# Patient Record
Sex: Female | Born: 1940 | Race: Black or African American | Hispanic: No | Smoking: Never smoker
Health system: Southern US, Community
[De-identification: ages and names within clinical notes are randomized; demographics above are authoritative.]

## PROBLEM LIST (undated history)

## (undated) DIAGNOSIS — M1990 Unspecified osteoarthritis, unspecified site: Secondary | ICD-10-CM

## (undated) DIAGNOSIS — Z872 Personal history of diseases of the skin and subcutaneous tissue: Secondary | ICD-10-CM

## (undated) DIAGNOSIS — D649 Anemia, unspecified: Secondary | ICD-10-CM

## (undated) DIAGNOSIS — N189 Chronic kidney disease, unspecified: Secondary | ICD-10-CM

## (undated) DIAGNOSIS — E669 Obesity, unspecified: Secondary | ICD-10-CM

## (undated) DIAGNOSIS — I10 Essential (primary) hypertension: Secondary | ICD-10-CM

## (undated) DIAGNOSIS — E119 Type 2 diabetes mellitus without complications: Secondary | ICD-10-CM

## (undated) DIAGNOSIS — G609 Hereditary and idiopathic neuropathy, unspecified: Secondary | ICD-10-CM

## (undated) DIAGNOSIS — G5600 Carpal tunnel syndrome, unspecified upper limb: Secondary | ICD-10-CM

## (undated) DIAGNOSIS — H409 Unspecified glaucoma: Secondary | ICD-10-CM

## (undated) DIAGNOSIS — E785 Hyperlipidemia, unspecified: Secondary | ICD-10-CM

## (undated) HISTORY — DX: Hyperlipidemia, unspecified: E78.5

## (undated) HISTORY — DX: Unspecified glaucoma: H40.9

## (undated) HISTORY — DX: Personal history of diseases of the skin and subcutaneous tissue: Z87.2

## (undated) HISTORY — DX: Type 2 diabetes mellitus without complications: E11.9

## (undated) HISTORY — DX: Carpal tunnel syndrome, unspecified upper limb: G56.00

## (undated) HISTORY — DX: Anemia, unspecified: D64.9

## (undated) HISTORY — PX: COLONOSCOPY: SHX174

## (undated) HISTORY — DX: Chronic kidney disease, unspecified: N18.9

## (undated) HISTORY — DX: Essential (primary) hypertension: I10

## (undated) HISTORY — DX: Hereditary and idiopathic neuropathy, unspecified: G60.9

## (undated) HISTORY — DX: Obesity, unspecified: E66.9

---

## 1997-12-30 ENCOUNTER — Other Ambulatory Visit: Admission: RE | Admit: 1997-12-30 | Discharge: 1997-12-30 | Payer: Self-pay | Admitting: Obstetrics & Gynecology

## 1997-12-30 ENCOUNTER — Other Ambulatory Visit: Admission: RE | Admit: 1997-12-30 | Discharge: 1997-12-30 | Payer: Self-pay | Admitting: Obstetrics and Gynecology

## 1998-01-06 ENCOUNTER — Encounter: Admission: RE | Admit: 1998-01-06 | Discharge: 1998-04-06 | Payer: Self-pay | Admitting: Internal Medicine

## 2000-02-02 ENCOUNTER — Emergency Department (HOSPITAL_COMMUNITY): Admission: EM | Admit: 2000-02-02 | Discharge: 2000-02-03 | Payer: Self-pay | Admitting: *Deleted

## 2001-10-31 ENCOUNTER — Inpatient Hospital Stay (HOSPITAL_COMMUNITY): Admission: EM | Admit: 2001-10-31 | Discharge: 2001-11-07 | Payer: Self-pay | Admitting: *Deleted

## 2001-10-31 ENCOUNTER — Encounter: Payer: Self-pay | Admitting: Internal Medicine

## 2001-11-01 ENCOUNTER — Encounter: Admission: RE | Admit: 2001-11-01 | Discharge: 2001-11-01 | Payer: Self-pay | Admitting: Internal Medicine

## 2001-11-01 ENCOUNTER — Encounter: Payer: Self-pay | Admitting: Internal Medicine

## 2001-11-15 ENCOUNTER — Encounter: Admission: RE | Admit: 2001-11-15 | Discharge: 2001-11-15 | Payer: Self-pay | Admitting: Internal Medicine

## 2001-12-07 ENCOUNTER — Encounter: Admission: RE | Admit: 2001-12-07 | Discharge: 2001-12-07 | Payer: Self-pay | Admitting: Internal Medicine

## 2001-12-14 ENCOUNTER — Encounter: Admission: RE | Admit: 2001-12-14 | Discharge: 2001-12-14 | Payer: Self-pay | Admitting: Internal Medicine

## 2001-12-20 ENCOUNTER — Encounter: Payer: Self-pay | Admitting: Emergency Medicine

## 2001-12-20 ENCOUNTER — Emergency Department (HOSPITAL_COMMUNITY): Admission: EM | Admit: 2001-12-20 | Discharge: 2001-12-20 | Payer: Self-pay | Admitting: Emergency Medicine

## 2001-12-22 ENCOUNTER — Other Ambulatory Visit: Admission: RE | Admit: 2001-12-22 | Discharge: 2001-12-22 | Payer: Self-pay | Admitting: General Surgery

## 2002-02-23 ENCOUNTER — Encounter: Admission: RE | Admit: 2002-02-23 | Discharge: 2002-02-23 | Payer: Self-pay | Admitting: Internal Medicine

## 2002-03-09 ENCOUNTER — Encounter: Admission: RE | Admit: 2002-03-09 | Discharge: 2002-03-09 | Payer: Self-pay | Admitting: Internal Medicine

## 2002-04-17 ENCOUNTER — Encounter: Admission: RE | Admit: 2002-04-17 | Discharge: 2002-04-17 | Payer: Self-pay | Admitting: Internal Medicine

## 2002-05-01 ENCOUNTER — Encounter: Admission: RE | Admit: 2002-05-01 | Discharge: 2002-05-01 | Payer: Self-pay | Admitting: Internal Medicine

## 2002-05-15 ENCOUNTER — Encounter: Admission: RE | Admit: 2002-05-15 | Discharge: 2002-05-15 | Payer: Self-pay | Admitting: Internal Medicine

## 2002-07-30 ENCOUNTER — Encounter: Admission: RE | Admit: 2002-07-30 | Discharge: 2002-07-30 | Payer: Self-pay | Admitting: Internal Medicine

## 2002-08-03 ENCOUNTER — Encounter: Admission: RE | Admit: 2002-08-03 | Discharge: 2002-08-03 | Payer: Self-pay | Admitting: Internal Medicine

## 2002-08-13 ENCOUNTER — Encounter: Admission: RE | Admit: 2002-08-13 | Discharge: 2002-08-13 | Payer: Self-pay | Admitting: Internal Medicine

## 2002-08-30 ENCOUNTER — Encounter: Admission: RE | Admit: 2002-08-30 | Discharge: 2002-08-30 | Payer: Self-pay | Admitting: Internal Medicine

## 2002-12-17 ENCOUNTER — Encounter: Admission: RE | Admit: 2002-12-17 | Discharge: 2002-12-17 | Payer: Self-pay | Admitting: Internal Medicine

## 2003-01-14 ENCOUNTER — Encounter: Admission: RE | Admit: 2003-01-14 | Discharge: 2003-01-14 | Payer: Self-pay | Admitting: Internal Medicine

## 2003-01-25 ENCOUNTER — Encounter: Admission: RE | Admit: 2003-01-25 | Discharge: 2003-01-25 | Payer: Self-pay | Admitting: Internal Medicine

## 2003-11-29 ENCOUNTER — Ambulatory Visit: Payer: Self-pay | Admitting: Internal Medicine

## 2004-02-13 ENCOUNTER — Ambulatory Visit: Payer: Self-pay | Admitting: Internal Medicine

## 2004-09-14 ENCOUNTER — Ambulatory Visit: Payer: Self-pay | Admitting: Internal Medicine

## 2004-09-18 ENCOUNTER — Ambulatory Visit: Payer: Self-pay | Admitting: Internal Medicine

## 2004-09-23 ENCOUNTER — Ambulatory Visit: Payer: Self-pay | Admitting: Oncology

## 2004-10-08 ENCOUNTER — Encounter: Admission: RE | Admit: 2004-10-08 | Discharge: 2004-10-08 | Payer: Self-pay | Admitting: Internal Medicine

## 2004-11-18 ENCOUNTER — Ambulatory Visit: Payer: Self-pay | Admitting: Oncology

## 2005-01-04 ENCOUNTER — Ambulatory Visit: Payer: Self-pay | Admitting: Hospitalist

## 2005-01-11 ENCOUNTER — Ambulatory Visit: Payer: Self-pay | Admitting: Internal Medicine

## 2005-01-12 ENCOUNTER — Ambulatory Visit: Payer: Self-pay | Admitting: Oncology

## 2005-03-05 ENCOUNTER — Ambulatory Visit: Payer: Self-pay | Admitting: Oncology

## 2005-04-20 ENCOUNTER — Ambulatory Visit: Payer: Self-pay | Admitting: Oncology

## 2005-04-27 ENCOUNTER — Ambulatory Visit: Payer: Self-pay | Admitting: Internal Medicine

## 2005-05-04 ENCOUNTER — Ambulatory Visit: Payer: Self-pay | Admitting: Internal Medicine

## 2005-11-16 ENCOUNTER — Ambulatory Visit: Payer: Self-pay | Admitting: Internal Medicine

## 2005-12-02 ENCOUNTER — Ambulatory Visit: Payer: Self-pay | Admitting: Gastroenterology

## 2005-12-15 ENCOUNTER — Encounter: Payer: Self-pay | Admitting: Internal Medicine

## 2005-12-15 ENCOUNTER — Ambulatory Visit: Payer: Self-pay | Admitting: Gastroenterology

## 2006-02-22 ENCOUNTER — Ambulatory Visit: Payer: Self-pay | Admitting: Internal Medicine

## 2006-02-22 ENCOUNTER — Encounter (INDEPENDENT_AMBULATORY_CARE_PROVIDER_SITE_OTHER): Payer: Self-pay | Admitting: *Deleted

## 2006-02-22 DIAGNOSIS — D539 Nutritional anemia, unspecified: Secondary | ICD-10-CM

## 2006-02-22 DIAGNOSIS — E669 Obesity, unspecified: Secondary | ICD-10-CM

## 2006-02-22 DIAGNOSIS — E785 Hyperlipidemia, unspecified: Secondary | ICD-10-CM

## 2006-02-22 DIAGNOSIS — E1365 Other specified diabetes mellitus with hyperglycemia: Secondary | ICD-10-CM

## 2006-02-22 DIAGNOSIS — K5900 Constipation, unspecified: Secondary | ICD-10-CM | POA: Insufficient documentation

## 2006-02-22 DIAGNOSIS — I10 Essential (primary) hypertension: Secondary | ICD-10-CM

## 2006-02-22 DIAGNOSIS — G5600 Carpal tunnel syndrome, unspecified upper limb: Secondary | ICD-10-CM | POA: Insufficient documentation

## 2006-02-22 DIAGNOSIS — E1329 Other specified diabetes mellitus with other diabetic kidney complication: Secondary | ICD-10-CM

## 2006-02-22 DIAGNOSIS — E1140 Type 2 diabetes mellitus with diabetic neuropathy, unspecified: Secondary | ICD-10-CM

## 2006-02-22 LAB — CONVERTED CEMR LAB
BUN: 27 mg/dL — ABNORMAL HIGH (ref 6–23)
Calcium: 9.3 mg/dL (ref 8.4–10.5)
Glucose, Bld: 117 mg/dL — ABNORMAL HIGH (ref 70–99)
Potassium: 4.3 meq/L (ref 3.5–5.3)
Sodium: 141 meq/L (ref 135–145)

## 2006-04-06 ENCOUNTER — Encounter: Admission: RE | Admit: 2006-04-06 | Discharge: 2006-04-06 | Payer: Self-pay | Admitting: Sports Medicine

## 2006-05-16 ENCOUNTER — Telehealth (INDEPENDENT_AMBULATORY_CARE_PROVIDER_SITE_OTHER): Payer: Self-pay | Admitting: *Deleted

## 2006-06-28 ENCOUNTER — Ambulatory Visit: Payer: Self-pay | Admitting: Hospitalist

## 2006-06-28 DIAGNOSIS — Z872 Personal history of diseases of the skin and subcutaneous tissue: Secondary | ICD-10-CM | POA: Insufficient documentation

## 2006-06-28 DIAGNOSIS — J069 Acute upper respiratory infection, unspecified: Secondary | ICD-10-CM | POA: Insufficient documentation

## 2006-06-28 DIAGNOSIS — G609 Hereditary and idiopathic neuropathy, unspecified: Secondary | ICD-10-CM | POA: Insufficient documentation

## 2006-06-28 LAB — CONVERTED CEMR LAB: Hgb A1c MFr Bld: 6.4 %

## 2006-07-25 ENCOUNTER — Encounter (INDEPENDENT_AMBULATORY_CARE_PROVIDER_SITE_OTHER): Payer: Self-pay | Admitting: *Deleted

## 2006-08-04 ENCOUNTER — Ambulatory Visit: Payer: Self-pay | Admitting: Internal Medicine

## 2006-08-04 ENCOUNTER — Encounter (INDEPENDENT_AMBULATORY_CARE_PROVIDER_SITE_OTHER): Payer: Self-pay | Admitting: *Deleted

## 2006-08-08 LAB — CONVERTED CEMR LAB
Albumin: 4.3 g/dL (ref 3.5–5.2)
Alkaline Phosphatase: 51 units/L (ref 39–117)
Bilirubin Urine: NEGATIVE
CO2: 24 meq/L (ref 19–32)
Chloride: 108 meq/L (ref 96–112)
Creatinine, Ser: 1.37 mg/dL — ABNORMAL HIGH (ref 0.40–1.20)
Creatinine, Urine: 173.2 mg/dL
Eosinophils Absolute: 0.1 10*3/uL (ref 0.0–0.7)
Eosinophils Relative: 2 % (ref 0–5)
Glucose, Bld: 109 mg/dL — ABNORMAL HIGH (ref 70–99)
Hemoglobin: 9.5 g/dL — ABNORMAL LOW (ref 12.0–15.0)
Lymphocytes Relative: 36 % (ref 12–46)
Lymphs Abs: 2.2 10*3/uL (ref 0.7–3.3)
MCV: 87.4 fL (ref 78.0–100.0)
Monocytes Absolute: 0.5 10*3/uL (ref 0.2–0.7)
Monocytes Relative: 8 % (ref 3–11)
Neutrophils Relative %: 54 % (ref 43–77)
Platelets: 244 10*3/uL (ref 150–400)
Potassium: 5 meq/L (ref 3.5–5.3)
RBC: 3.42 M/uL — ABNORMAL LOW (ref 3.87–5.11)
Sodium: 140 meq/L (ref 135–145)
Specific Gravity, Urine: 1.02 (ref 1.005–1.03)
TSH: 1.243 microintl units/mL (ref 0.350–5.50)
Total Bilirubin: 0.4 mg/dL (ref 0.3–1.2)
Total CHOL/HDL Ratio: 3.3
Triglycerides: 86 mg/dL (ref <150)

## 2006-10-20 ENCOUNTER — Telehealth: Payer: Self-pay | Admitting: *Deleted

## 2006-12-02 ENCOUNTER — Telehealth: Payer: Self-pay | Admitting: *Deleted

## 2006-12-16 ENCOUNTER — Encounter (INDEPENDENT_AMBULATORY_CARE_PROVIDER_SITE_OTHER): Payer: Self-pay | Admitting: *Deleted

## 2007-01-02 ENCOUNTER — Telehealth (INDEPENDENT_AMBULATORY_CARE_PROVIDER_SITE_OTHER): Payer: Self-pay | Admitting: *Deleted

## 2007-01-17 ENCOUNTER — Ambulatory Visit: Payer: Self-pay | Admitting: Internal Medicine

## 2007-01-17 ENCOUNTER — Encounter (INDEPENDENT_AMBULATORY_CARE_PROVIDER_SITE_OTHER): Payer: Self-pay | Admitting: *Deleted

## 2007-01-17 LAB — CONVERTED CEMR LAB
Albumin: 4.2 g/dL (ref 3.5–5.2)
Alkaline Phosphatase: 50 units/L (ref 39–117)
CO2: 24 meq/L (ref 19–32)
Chloride: 108 meq/L (ref 96–112)
Creatinine, Ser: 1.34 mg/dL — ABNORMAL HIGH (ref 0.40–1.20)
Eosinophils Relative: 2 % (ref 0–5)
Ferritin: 79 ng/mL (ref 10–291)
Glucose, Bld: 77 mg/dL (ref 70–99)
HCT: 30.3 % — ABNORMAL LOW (ref 36.0–46.0)
MCHC: 33 g/dL (ref 30.0–36.0)
MCV: 88.6 fL (ref 78.0–100.0)
Monocytes Absolute: 0.6 10*3/uL (ref 0.1–1.0)
Neutro Abs: 3 10*3/uL (ref 1.7–7.7)
Neutrophils Relative %: 47 % (ref 43–77)
Platelets: 224 10*3/uL (ref 150–400)
RBC: 3.42 M/uL — ABNORMAL LOW (ref 3.87–5.11)
RDW: 13.4 % (ref 11.5–15.5)
Total Bilirubin: 0.4 mg/dL (ref 0.3–1.2)
WBC: 6.3 10*3/uL (ref 4.0–10.5)

## 2007-04-25 ENCOUNTER — Encounter (INDEPENDENT_AMBULATORY_CARE_PROVIDER_SITE_OTHER): Payer: Self-pay | Admitting: *Deleted

## 2007-04-25 ENCOUNTER — Ambulatory Visit: Payer: Self-pay | Admitting: Internal Medicine

## 2007-04-25 LAB — CONVERTED CEMR LAB: Blood Glucose, Fingerstick: 105

## 2007-06-27 ENCOUNTER — Encounter: Payer: Self-pay | Admitting: Internal Medicine

## 2007-07-17 ENCOUNTER — Encounter (INDEPENDENT_AMBULATORY_CARE_PROVIDER_SITE_OTHER): Payer: Self-pay | Admitting: *Deleted

## 2007-07-17 ENCOUNTER — Ambulatory Visit: Payer: Self-pay | Admitting: *Deleted

## 2007-08-17 ENCOUNTER — Encounter (INDEPENDENT_AMBULATORY_CARE_PROVIDER_SITE_OTHER): Payer: Self-pay | Admitting: Internal Medicine

## 2007-08-17 ENCOUNTER — Ambulatory Visit: Payer: Self-pay | Admitting: Internal Medicine

## 2007-08-17 LAB — CONVERTED CEMR LAB: Blood Glucose, Fingerstick: 107

## 2007-08-18 LAB — CONVERTED CEMR LAB
AST: 14 units/L (ref 0–37)
Alkaline Phosphatase: 58 units/L (ref 39–117)
CO2: 23 meq/L (ref 19–32)
Calcium: 9.6 mg/dL (ref 8.4–10.5)
Creatinine, Ser: 1.56 mg/dL — ABNORMAL HIGH (ref 0.40–1.20)
Glucose, Bld: 91 mg/dL (ref 70–99)
HDL: 56 mg/dL (ref >39)
MCV: 87 fL (ref 78.0–100.0)
Platelets: 203 10*3/uL (ref 150–400)
RBC: 3.18 M/uL — ABNORMAL LOW (ref 3.87–5.11)
RDW: 13.5 % (ref 11.5–15.5)
Sodium: 141 meq/L (ref 135–145)
Total Bilirubin: 0.6 mg/dL (ref 0.3–1.2)
Triglycerides: 99 mg/dL (ref <150)
VLDL: 20 mg/dL (ref 0–40)

## 2008-03-20 ENCOUNTER — Encounter: Payer: Self-pay | Admitting: Internal Medicine

## 2008-04-22 ENCOUNTER — Encounter (INDEPENDENT_AMBULATORY_CARE_PROVIDER_SITE_OTHER): Payer: Self-pay | Admitting: Internal Medicine

## 2008-04-22 ENCOUNTER — Ambulatory Visit: Payer: Self-pay | Admitting: *Deleted

## 2008-04-22 ENCOUNTER — Encounter: Payer: Self-pay | Admitting: Internal Medicine

## 2008-04-22 LAB — CONVERTED CEMR LAB
Blood Glucose, Fingerstick: 96
Hgb A1c MFr Bld: 6.6 %

## 2008-04-24 LAB — CONVERTED CEMR LAB
Albumin: 4.1 g/dL (ref 3.5–5.2)
Chloride: 108 meq/L (ref 96–112)
HCT: 31.2 % — ABNORMAL LOW (ref 36.0–46.0)
Hemoglobin: 10.1 g/dL — ABNORMAL LOW (ref 12.0–15.0)
Platelets: 228 10*3/uL (ref 150–400)
RBC: 3.52 M/uL — ABNORMAL LOW (ref 3.87–5.11)
Sodium: 143 meq/L (ref 135–145)
Vitamin B-12: 259 pg/mL (ref 211–911)
WBC: 5.8 10*3/uL (ref 4.0–10.5)

## 2008-04-25 ENCOUNTER — Encounter: Admission: RE | Admit: 2008-04-25 | Discharge: 2008-04-25 | Payer: Self-pay | Admitting: Internal Medicine

## 2008-05-07 ENCOUNTER — Encounter (INDEPENDENT_AMBULATORY_CARE_PROVIDER_SITE_OTHER): Payer: Self-pay | Admitting: Internal Medicine

## 2008-05-07 ENCOUNTER — Ambulatory Visit: Payer: Self-pay | Admitting: Internal Medicine

## 2008-05-14 LAB — CONVERTED CEMR LAB
BUN: 31 mg/dL — ABNORMAL HIGH (ref 6–23)
CO2: 22 meq/L (ref 19–32)
Calcium: 9.8 mg/dL (ref 8.4–10.5)
Creatinine, Ser: 1.61 mg/dL — ABNORMAL HIGH (ref 0.40–1.20)
Glucose, Bld: 104 mg/dL — ABNORMAL HIGH (ref 70–99)
Sodium: 144 meq/L (ref 135–145)

## 2008-05-15 ENCOUNTER — Encounter: Payer: Self-pay | Admitting: Internal Medicine

## 2008-05-16 ENCOUNTER — Ambulatory Visit: Payer: Self-pay | Admitting: *Deleted

## 2008-05-16 ENCOUNTER — Encounter (INDEPENDENT_AMBULATORY_CARE_PROVIDER_SITE_OTHER): Payer: Self-pay | Admitting: Internal Medicine

## 2008-05-16 LAB — CONVERTED CEMR LAB

## 2008-05-17 LAB — CONVERTED CEMR LAB
BUN: 26 mg/dL — ABNORMAL HIGH (ref 6–23)
CO2: 22 meq/L (ref 19–32)
Calcium: 9.5 mg/dL (ref 8.4–10.5)
Creatinine, Ser: 1.36 mg/dL — ABNORMAL HIGH (ref 0.40–1.20)
Potassium: 4.9 meq/L (ref 3.5–5.3)
Sodium: 139 meq/L (ref 135–145)

## 2008-05-20 ENCOUNTER — Encounter: Payer: Self-pay | Admitting: Licensed Clinical Social Worker

## 2008-05-20 ENCOUNTER — Encounter: Payer: Self-pay | Admitting: Internal Medicine

## 2008-07-16 ENCOUNTER — Telehealth: Payer: Self-pay | Admitting: Internal Medicine

## 2008-08-19 ENCOUNTER — Telehealth: Payer: Self-pay | Admitting: Internal Medicine

## 2008-11-26 ENCOUNTER — Encounter: Payer: Self-pay | Admitting: Internal Medicine

## 2008-12-10 ENCOUNTER — Ambulatory Visit: Payer: Self-pay | Admitting: Internal Medicine

## 2008-12-10 DIAGNOSIS — N185 Chronic kidney disease, stage 5: Secondary | ICD-10-CM | POA: Insufficient documentation

## 2008-12-10 DIAGNOSIS — N184 Chronic kidney disease, stage 4 (severe): Secondary | ICD-10-CM

## 2008-12-10 LAB — CONVERTED CEMR LAB: Blood Glucose, Fingerstick: 82

## 2008-12-11 LAB — CONVERTED CEMR LAB
ALT: 8 units/L (ref 0–35)
Albumin: 4 g/dL (ref 3.5–5.2)
Calcium: 10 mg/dL (ref 8.4–10.5)
Chloride: 107 meq/L (ref 96–112)
Creatinine, Ser: 1.38 mg/dL — ABNORMAL HIGH (ref 0.40–1.20)
HCT: 31.3 % — ABNORMAL LOW (ref 36.0–46.0)
Hemoglobin: 10.1 g/dL — ABNORMAL LOW (ref 12.0–15.0)
LDL Cholesterol: 179 mg/dL — ABNORMAL HIGH (ref 0–99)
MCHC: 32.3 g/dL (ref 30.0–36.0)
MCV: 86.5 fL (ref >=78.0)
Potassium: 4.7 meq/L (ref 3.5–5.3)
RDW: 12.7 % (ref 11.5–15.5)
Sodium: 141 meq/L (ref 135–145)
WBC: 6.5 10*3/uL (ref 4.0–10.5)

## 2008-12-18 ENCOUNTER — Ambulatory Visit: Payer: Self-pay | Admitting: Internal Medicine

## 2008-12-18 LAB — CONVERTED CEMR LAB: OCCULT 1: NEGATIVE

## 2009-02-24 ENCOUNTER — Telehealth: Payer: Self-pay | Admitting: Internal Medicine

## 2009-02-25 ENCOUNTER — Emergency Department (HOSPITAL_COMMUNITY): Admission: EM | Admit: 2009-02-25 | Discharge: 2009-02-25 | Payer: Self-pay | Admitting: Family Medicine

## 2009-06-16 ENCOUNTER — Ambulatory Visit: Payer: Self-pay | Admitting: Internal Medicine

## 2009-06-16 ENCOUNTER — Encounter: Payer: Self-pay | Admitting: Internal Medicine

## 2009-06-16 DIAGNOSIS — N390 Urinary tract infection, site not specified: Secondary | ICD-10-CM

## 2009-06-16 LAB — CONVERTED CEMR LAB
Bilirubin Urine: NEGATIVE
Blood Glucose, Fingerstick: 113
Glucose, Urine, Semiquant: NEGATIVE
Ketones, urine, test strip: NEGATIVE
Protein, U semiquant: >=300
Urobilinogen, UA: 0.2

## 2009-06-16 LAB — HM DIABETES FOOT EXAM

## 2009-06-17 ENCOUNTER — Encounter: Payer: Self-pay | Admitting: Internal Medicine

## 2009-06-17 LAB — CONVERTED CEMR LAB
Calcium: 10 mg/dL (ref 8.4–10.5)
Chloride: 107 meq/L (ref 96–112)
Cholesterol: 271 mg/dL — ABNORMAL HIGH (ref 0–200)
Glucose, Bld: 110 mg/dL — ABNORMAL HIGH (ref 70–99)
MCHC: 31.6 g/dL (ref 30.0–36.0)
MCV: 88.5 fL (ref >=78.0)
Platelets: 214 10*3/uL (ref 150–400)
Potassium: 4.1 meq/L (ref 3.5–5.3)
Sodium: 141 meq/L (ref 135–145)
Total CHOL/HDL Ratio: 4.7
Triglycerides: 109 mg/dL (ref <150)
VLDL: 22 mg/dL (ref 0–40)

## 2009-06-24 ENCOUNTER — Encounter: Admission: RE | Admit: 2009-06-24 | Discharge: 2009-06-24 | Payer: Self-pay | Admitting: Internal Medicine

## 2009-07-02 ENCOUNTER — Encounter: Payer: Self-pay | Admitting: Internal Medicine

## 2009-07-02 LAB — HM DIABETES EYE EXAM

## 2009-09-25 ENCOUNTER — Encounter: Payer: Self-pay | Admitting: Internal Medicine

## 2009-10-07 ENCOUNTER — Telehealth: Payer: Self-pay | Admitting: Internal Medicine

## 2010-01-08 ENCOUNTER — Ambulatory Visit: Payer: Self-pay | Admitting: Internal Medicine

## 2010-01-08 DIAGNOSIS — H409 Unspecified glaucoma: Secondary | ICD-10-CM

## 2010-01-09 LAB — CONVERTED CEMR LAB
ALT: 12 units/L (ref 0–35)
Alkaline Phosphatase: 54 units/L (ref 39–117)
Basophils Absolute: 0 10*3/uL (ref 0.0–0.1)
Basophils Relative: 0.7 % (ref 0.0–3.0)
Bilirubin Urine: NEGATIVE
Bilirubin, Direct: 0 mg/dL (ref 0.0–0.3)
Cholesterol: 233 mg/dL — ABNORMAL HIGH (ref 0–200)
Direct LDL: 155.4 mg/dL
Folate: 4.9 ng/mL
HCT: 27.6 % — ABNORMAL LOW (ref 36.0–46.0)
Hgb A1c MFr Bld: 6.9 % — ABNORMAL HIGH (ref 4.6–6.5)
Lymphs Abs: 1.9 10*3/uL (ref 0.7–4.0)
Monocytes Absolute: 0.6 10*3/uL (ref 0.1–1.0)
Monocytes Relative: 11.4 % (ref 3.0–12.0)
Neutrophils Relative %: 49.7 % (ref 43.0–77.0)
Nitrite: NEGATIVE
Platelets: 172 10*3/uL (ref 150.0–400.0)
RBC: 3.21 M/uL — ABNORMAL LOW (ref 3.87–5.11)
Specific Gravity, Urine: 1.02 (ref 1.000–1.030)
Total Bilirubin: 0.5 mg/dL (ref 0.3–1.2)
Total CHOL/HDL Ratio: 5
Total CK: 90 units/L (ref 7–177)
Total Protein: 6.9 g/dL (ref 6.0–8.3)

## 2010-03-25 ENCOUNTER — Encounter: Payer: Self-pay | Admitting: Internal Medicine

## 2010-04-07 NOTE — Assessment & Plan Note (Signed)
Summary: urine results   Impression & Recommendations:  Problem # 1:  UTI (ICD-599.0)  UA positive for leuks and nitrate with moderate blood and pt only reports increased urination otherwise asymptomatic, I will call in the perscription for her. Bactrim DS for 3 days since she has no hx of UTI.  Labs Reviewed: BUN: 24 (12/10/2008)   Cr: 1.38 (12/10/2008)    Hgb: 10.1 (12/10/2008)   Hct: 31.3 (12/10/2008)   Ca++: 10.0 (12/10/2008)    TP: 7.5 (12/10/2008)   Alb: 4.0 (12/10/2008)   Encouraged to push clear liquids, get enough rest, and take acetaminophen as needed. To be seen in 10 days if no improvement, sooner if worse.   Prescriptions: BACTRIM DS 800-160 MG TABS (SULFAMETHOXAZOLE-TRIMETHOPRIM) Take 1 tablet by mouth two times a day  #6 x 0   Entered and Authorized by:   Trinidad Curet MD   Signed by:   Trinidad Curet MD on 06/16/2009   Method used:   Electronically to        [facility] (retail)       [place], [place]  [id number]       Ph: [phone]       Fax: [phone]   RxID:   [id number]   Laboratory Results   Urine Tests  Date/Time Received: 06/16/09 9:26AM Date/Time Reported: same  Routine Urinalysis   Color: yellow Appearance: Hazy Glucose: negative   (Normal Range: Negative) Bilirubin: negative   (Normal Range: Negative) Ketone: negative   (Normal Range: Negative) Spec. Gravity: >=1.030   (Normal Range: 1.003-1.035) Blood: moderate   (Normal Range: Negative) pH: 5.0   (Normal Range: 5.0-8.0) Protein: >=300   (Normal Range: Negative) Urobilinogen: 0.2   (Normal Range: 0-1) Nitrite: positive   (Normal Range: Negative) Leukocyte Esterace: large   (Normal Range: Negative)         Impression & Recommendations:  Her updated medication list for this problem includes:    Bactrim Ds 800-160 Mg Tabs (Sulfamethoxazole-trimethoprim) .Marland Kitchen... Take 1 tablet by mouth two times a day

## 2010-04-07 NOTE — Letter (Signed)
Summary: [facility]  [facility]   Imported By: Garlan Fillers 07/10/2009 15:07:28  _____________________________________________________________________  External Attachment:    Type:   Image     Comment:   External Document  Appended Document: [facility]   Diabetic Eye Exam  Procedure date:  07/02/2009  Findings:      No diabetic retinopathy.   Cataract.  OS     Diabetic Eye Exam  Procedure date:  07/02/2009  Findings:      No diabetic retinopathy.   Cataract.  OS

## 2010-04-07 NOTE — Progress Notes (Signed)
Summary: [facility]  Phone Note Other Incoming   Caller: [facility] Summary of Call: Call from the [facility].  Forms in Dr. Paulita Cradle box to be faxed when completed. Sander Nephew RN  October 07, 2009 12:04 PM  Initial call taken by: Sander Nephew RN,  October 07, 2009 12:04 PM  Follow-up for Phone Call        completed refill, thank you Iskra  Follow-up by: Trinidad Curet MD,  October 07, 2009 2:07 PM

## 2010-04-07 NOTE — Assessment & Plan Note (Signed)
Summary: new / medicare / # / cd   Vital Signs:  Patient profile:   70 year old female Height:      64 inches (162.56 cm) Weight:      215.4 pounds (97.91 kg) O2 Sat:      96 % on Room air Temp:     98.0 degrees F (36.67 degrees C) oral Pulse rate:   80 / minute BP sitting:   132 / 72  (left arm) Cuff size:   large  Vitals Entered By: Tomma Lightning RMA (January 08, 2010 9:25 AM)  O2 Flow:  Room air CC: New patient Is Patient Diabetic? Yes Did you bring your meter with you today? No Pain Assessment Patient in pain? no      Comments Pt states have cough, want to check with md before getting flu shot   Primary Care Provider:  Rowe Clack MD  CC:  New patient.  History of Present Illness: new pt to me and our practice, here to est care prev followed at [facility]  1) DM2 - reports compliance with ongoing medical treatment and no changes in medication dose or frequency. denies adverse side effects related to current therapy.   2) HTN - reports compliance with ongoing medical treatment and no changes in medication dose or frequency. denies adverse side effects related to current therapy.   3) dyslipidemia - reports compliance with ongoing medical treatment and no changes in medication dose or frequency. denies adverse side effects related to current therapy.   4) chronic anemia - colo 2008 - WNL - f/u 10y - no melena or BRBPR - no abd pain or dyspepsia - no NSAIDs use  Preventive Screening-Counseling & Management  Alcohol-Tobacco     Alcohol drinks/day: 0     Alcohol Counseling: not indicated; patient does not drink     Smoking Status: never  Caffeine-Diet-Exercise     Exercise Counseling: to improve exercise regimen     Depression Counseling: not indicated; screening negative for depression  Safety-Violence-Falls     Violence Counseling: not indicated; no violence risk noted     Fall Risk Counseling: not indicated; no significant falls noted  Clinical Review  Panels:  Lipid Management   Cholesterol:  271 (06/17/2009)   LDL (bad choesterol):  191 (06/17/2009)   HDL (good cholesterol):  58 (06/17/2009)  CBC   WBC:  4.7 (06/17/2009)   RBC:  3.40 (06/17/2009)   Hgb:  9.5 (06/17/2009)   Hct:  30.1 (06/17/2009)   Platelets:  214 (06/17/2009)   MCV  88.5 (06/17/2009)   MCHC  31.6 (06/17/2009)   RDW  13.3 (06/17/2009)   PMN:  47 (01/17/2007)   Lymphs:  41 (01/17/2007)   Monos:  9 (01/17/2007)   Eosinophils:  2 (01/17/2007)   Basophil:  0 (01/17/2007)  Complete Metabolic Panel   Glucose:  110 (06/17/2009)   Sodium:  141 (06/17/2009)   Potassium:  4.1 (06/17/2009)   Chloride:  107 (06/17/2009)   CO2:  25 (06/17/2009)   BUN:  19 (06/17/2009)   Creatinine:  1.29 (06/17/2009)   Albumin:  4.0 (12/10/2008)   Total Protein:  7.5 (12/10/2008)   Calcium:  10.0 (06/17/2009)   Total Bili:  0.4 (12/10/2008)   Alk Phos:  60 (12/10/2008)   SGPT (ALT):  8 (12/10/2008)   SGOT (AST):  12 (12/10/2008)   Current Medications (verified): 1)  Glucophage 1000 Mg Tabs (Metformin Hcl) .... Take 1 Tablet By Mouth Two Times A Day  2)  Norvasc 10 Mg Tabs (Amlodipine Besylate) .... Take 1 Tablet By Mouth Once A Day 3)  Zocor 40 Mg Tabs (Simvastatin) .... Take 1 Tab By Mouth At Bedtime 4)  Atenolol 50 Mg Tabs (Atenolol) .... Take 1 Tablet By Mouth Once A Day 5)  Diovan 80 Mg Tabs (Valsartan) .... Take 1 Tablet By Mouth Once A Day  Allergies (verified): 1)  Lisinopril (Lisinopril)  Past History:  Past Medical History: Diabetes mellitus, type II Hyperlipidemia Hypertension Peripheral neuropathy Obestiy Bilateral Carpel Tunnel Constipation Anemia      s/p normal colonoscopy in 2008 Breast Cyst Cough ACEI  Past Surgical History: Denies surgical history  Family History: Family History of Alcoholism/Addiction (other relative) Family History of Arthritis (other relative) Family History Diabetes 1st degree relative (parent, grandparent) Family  History Hypertension (parent, grandparent, other relative)   Social History: Never Smoked, no alcohol widowed, lives with niece and niece 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 I have reviewed all other systems and they were negative.   Physical Exam  General:  overweight-appearing.  alert, well-developed, well-nourished, and cooperative to examination.   niece at side Head:  Normocephalic and atraumatic without obvious abnormalities. No apparent alopecia or balding. Eyes:  vision grossly intact; pupils equal, round and reactive to light.  conjunctiva and lids normal.   wears glasses Ears:  normal pinnae bilaterally, without erythema, swelling, or tenderness to palpation. TMs clear, without effusion, or cerumen impaction. Hearing grossly normal bilaterally  Mouth:  teeth and gums in good repair; mucous membranes moist, without lesions or ulcers. oropharynx clear without exudate, no erythema.  Neck:  thick, supple, full ROM, no masses, no thyromegaly; no thyroid nodules or tenderness. no JVD or carotid bruits.   Lungs:  normal respiratory effort, no intercostal retractions or use of accessory muscles; normal breath sounds bilaterally - no crackles and no wheezes.    Heart:  fatty ankles; otherwise, normal rate, regular rhythm, no murmur, and no rub. BLE without edema. normal DP pulses and normal cap refill in all 4 extremities    Abdomen:  soft, non-tender, normal bowel sounds, no distention; no masses and no appreciable hepatomegaly or splenomegaly.   Genitalia:  defer Msk:  No deformity or scoliosis noted of thoracic or lumbar spine.   Neurologic:  alert & oriented X3 and cranial nerves II-XII symetrically intact.  strength normal in all extremities, sensation intact to light touch, and gait normal. speech fluent without dysarthria or aphasia; follows commands with good comprehension.  Skin:  no rashes, vesicles, ulcers, or erythema. No  nodules or irregularity to palpation.  Psych:  Oriented X3, memory intact for recent and remote, normally interactive, good eye contact, not anxious appearing, not depressed appearing, and not agitated.      Impression & Recommendations:  Problem # 1:  DIABETES MELLITUS, TYPE II (ICD-250.00)  Her updated medication list for this problem includes:    Glucophage 1000 Mg Tabs (Metformin hcl) .Marland Kitchen... Take 1 tablet by mouth two times a day    Diovan 80 Mg Tabs (Valsartan) .Marland Kitchen... Take 1 tablet by mouth once a day  Orders: TLB-A1C / Hgb A1C (Glycohemoglobin) (83036-A1C)  Labs Reviewed: Creat: 1.29 (06/17/2009)     Last Eye Exam: No diabetic retinopathy.   Cataract.  OS   (07/02/2009) Reviewed HgBA1c results: 6.3 (06/16/2009)  6.8 (12/10/2008)  Problem # 2:  HYPERTENSION (ICD-401.9)  Her updated medication list for this  problem includes:    Norvasc 10 Mg Tabs (Amlodipine besylate) .Marland Kitchen... Take 1 tablet by mouth once a day    Atenolol 50 Mg Tabs (Atenolol) .Marland Kitchen... Take 1 tablet by mouth once a day    Diovan 80 Mg Tabs (Valsartan) .Marland Kitchen... Take 1 tablet by mouth once a day  Orders: TLB-Creatinine, Blood (82565-CREA)  BP today: 132/72 Prior BP: 137/63 (06/16/2009)  Labs Reviewed: K+: 4.1 (06/17/2009) Creat: : 1.29 (06/17/2009)   Chol: 271 (06/17/2009)   HDL: 58 (06/17/2009)   LDL: 191 (06/17/2009)   TG: 109 (06/17/2009)  Problem # 3:  HYPERLIPIDEMIA (ICD-272.4)  Her updated medication list for this problem includes:    Zocor 40 Mg Tabs (Simvastatin) .Marland Kitchen... Take 1 tab by mouth at bedtime  Orders: TLB-Lipid Panel (80061-LIPID)  Labs Reviewed: SGOT: 12 (12/10/2008)   SGPT: 8 (12/10/2008)   HDL:58 (06/17/2009), 52 (12/10/2008)  LDL:191 (06/17/2009), 179 (12/10/2008)  Chol:271 (06/17/2009), 265 (12/10/2008)  Trig:109 (06/17/2009), 171 (12/10/2008)  Problem # 4:  ANEMIA, NORMOCYTIC (ICD-285.9)  Orders: TLB-CBC Platelet - w/Differential (85025-CBCD) TLB-B12 + Folate Pnl  [id number]) TLB-IBC Pnl (Iron/FE;Transferrin) (83550-IBC)  Hgb: 9.5 (06/17/2009)   Hct: 30.1 (06/17/2009)   Platelets: 214 (06/17/2009) RBC: 3.40 (06/17/2009)   RDW: 13.3 (06/17/2009)   WBC: 4.7 (06/17/2009) MCV: 88.5 (06/17/2009)   MCHC: 31.6 (06/17/2009) Ferritin: 71 (04/22/2008) B12: 259 (04/22/2008)   TSH: 1.243 (08/04/2006)  Problem # 5:  UTI (ICD-599.0)  nocturnal symptoms primarily - no fever - check UA now and hold abx unless abn UA  The following medications were removed from the medication list:    Bactrim Ds 800-160 Mg Tabs (Sulfamethoxazole-trimethoprim) .Marland Kitchen... Take 1 tablet by mouth two times a day  Orders: TLB-Udip w/ Micro (81001-URINE)  Encouraged to push clear liquids but not after 5pm, get enough rest, and take acetaminophen as needed. To be seen in 10 days if no improvement, sooner if worse. Time spent with patient/family 45 minutes, more than 50% of this time was spent counseling patient on medications  Complete Medication List: 1)  Glucophage 1000 Mg Tabs (Metformin hcl) .... Take 1 tablet by mouth two times a day 2)  Norvasc 10 Mg Tabs (Amlodipine besylate) .... Take 1 tablet by mouth once a day 3)  Zocor 40 Mg Tabs (Simvastatin) .... Take 1 tab by mouth at bedtime 4)  Atenolol 50 Mg Tabs (Atenolol) .... Take 1 tablet by mouth once a day 5)  Diovan 80 Mg Tabs (Valsartan) .... Take 1 tablet by mouth once a day  Other Orders: TLB-Hepatic/Liver Function Pnl (80076-HEPATIC) TLB-CK Total Only(Creatine Kinase/CPK) (82550-CK) Flu Vaccine 52yrs + MEDICARE PATIENTS [id number]) Administration Flu vaccine - MCR [id number])  Patient Instructions: 1)  it was good to see you today. 2)  exam looks good today! 3)  flu shot done today - 4)  test(s) ordered today - your results will be posted on the phone tree for review in 48-72 hours from the time of test completion; call [phone] and enter your 9 digit MRN (listed above on this page, just below your name); if any  changes need to be made or there are abnormal results, you will be contacted directly.  5)  reviewed all medications - continue same without changes - refills will be done after review of labs 6)  Please schedule a follow-up appointment in 3-4 months to monitor diabetes and blood pressure, call sooner if problems.    Orders Added: 1)  TLB-CBC Platelet - w/Differential [85025-CBCD] 2)  TLB-B12 + Folate Pnl [[id number]_82607-B12/FOL]  3)  TLB-IBC Pnl (Iron/FE;Transferrin) [83550-IBC] 4)  TLB-Hepatic/Liver Function Pnl [80076-HEPATIC] 5)  TLB-CK Total Only(Creatine Kinase/CPK) [82550-CK] 6)  TLB-Lipid Panel [80061-LIPID] 7)  TLB-Creatinine, Blood [82565-CREA] 8)  TLB-A1C / Hgb A1C (Glycohemoglobin) [83036-A1C] 9)  TLB-Udip w/ Micro [81001-URINE] 10)  Flu Vaccine 59yrs + MEDICARE PATIENTS [Q2039] 11)  Administration Flu vaccine - MCR [id number] 12)  New Patient Level IV [id number]     Pap Smear  Procedure date:  03/09/2003  Findings:      Interpretation Result:Negative for intraepithelial Lesion or Malignancy.     Mammogram  Procedure date:  03/09/2007  Findings:      No specific mammographic evidence of malignancy.    Colonoscopy  Procedure date:  12/16/2006  Findings:      Location:  [place].  Results: Normal.   Comments:      Repeat colonoscopy in 10 years.   Flu Vaccine Consent Questions     Do you have a history of severe allergic reactions to this vaccine? no    Any prior history of allergic reactions to egg and/or gelatin? no    Do you have a sensitivity to the preservative Thimersol? no    Do you have a past history of Guillan-Barre Syndrome? no    Do you currently have an acute febrile illness? no    Have you ever had a severe reaction to latex? no    Vaccine information given and explained to patient? yes    Are you currently pregnant? no    Lot Number:[id number]   Exp Date:09/05/2010   Site Given  Left Deltoid IMon:  [place].    Results: Normal.   Comments:      Repeat colonoscopy in 10 years.    Marland Kitchenlbmedflu

## 2010-04-07 NOTE — Assessment & Plan Note (Signed)
Summary: med change  Prescriptions: ZOCOR 40 MG TABS (SIMVASTATIN) Take 1 tab by mouth at bedtime  #30 x 5   Entered and Authorized by:   Trinidad Curet MD   Signed by:   Trinidad Curet MD on 09/25/2009   Method used:   Electronically to        [facility] (retail)       [place], [place]  [id number]       Ph: [phone]       Fax: [phone]   RxID:   [id number]

## 2010-04-07 NOTE — Assessment & Plan Note (Signed)
Summary: EST-CK/FU/MEDS/CFB   Vital Signs:  Patient profile:   70 year old female Height:      64 inches (162.56 cm) Weight:      216.0 pounds (98.18 kg) BMI:     37.21 Temp:     97.2 degrees F (36.22 degrees C) oral Pulse rate:   83 / minute BP sitting:   137 / 63  (right arm)  Vitals Entered By: Hilda Blades Ditzler RN (June 16, 2009 8:45 AM) Is Patient Diabetic? Yes Did you bring your meter with you today? No Pain Assessment Patient in pain? no      Nutritional Status BMI of > 30 = obese Nutritional Status Detail appetite good CBG Result 113  Have you ever been in a relationship where you felt threatened, hurt or afraid?denies   Does patient need assistance? Functional Status Self care Ambulation Normal Comments Ck-up and freg urination x 6 months.   Primary Care Provider:  Trinidad Curet MD   History of Present Illness: 70 yo female with PMH outlined below presents to [facility] for regular follow up appointment. She has no concerns at the time. No recent sicknesses or hospitalizaitons. No episodes of chest pain, SOB, palpitations. No specific abdominal or urinary concerns. No recent changes in appetite, weight, sleep patterns, mood. She has frequent urination that has been going on for about 6 months and she thinks it is related to her BP medicine. She denies urgency, dysuria, incontinence, hematuria. Reports being compliant with medications.    Depression History:      The patient denies a depressed mood most of the day and a diminished interest in her usual daily activities.  The patient denies significant weight loss, significant weight gain, insomnia, hypersomnia, psychomotor agitation, psychomotor retardation, fatigue (loss of energy), feelings of worthlessness (guilt), impaired concentration (indecisiveness), and recurrent thoughts of death or suicide.        The patient denies that she feels like life is not worth living, denies that she wishes that she were dead, and denies  that she has thought about ending her life.         Preventive Screening-Counseling & Management  Alcohol-Tobacco     Smoking Status: quit     Year Quit: long time ago     Pack years: not that often  Problems Prior to Update: 1)  Renal Failure, Chronic  (ICD-585.9) 2)  Preventive Health Care  (ICD-V70.0) 3)  Uri  (ICD-465.9) 4)  Peripheral Neuropathy  (ICD-356.9) 5)  Constipation  (ICD-564.00) 6)  Anemia, Normocytic  (ICD-285.9) 7)  Carpal Tunnel Syndrome, Bilateral  (ICD-354.0) 8)  Breast Cyst, Hx of  (ICD-V13.3) 9)  Obesity  (ICD-278.00) 10)  Hypertension  (ICD-401.9) 11)  Hyperlipidemia  (ICD-272.4) 12)  Diabetes Mellitus, Type II  (ICD-250.00) 13)  Diabetic Peripheral Neuropathy  (ICD-250.60)  Medications Prior to Update: 1)  Glucophage 1000 Mg Tabs (Metformin Hcl) .... Take 1 Tablet By Mouth Two Times A Day 2)  Norvasc 10 Mg Tabs (Amlodipine Besylate) .... Take 1 Tablet By Mouth Once A Day 3)  Zocor 80 Mg  Tabs (Simvastatin) .... Take 1 Tablet By Mouth Once A Day 4)  Atenolol 50 Mg Tabs (Atenolol) .... Take 1 Tablet By Mouth Once A Day 5)  Diovan 80 Mg Tabs (Valsartan) .... Take 1 Tablet By Mouth Once A Day  Current Medications (verified): 1)  Glucophage 1000 Mg Tabs (Metformin Hcl) .... Take 1 Tablet By Mouth Two Times A Day 2)  Norvasc 10 Mg Tabs (  Amlodipine Besylate) .... Take 1 Tablet By Mouth Once A Day 3)  Zocor 80 Mg  Tabs (Simvastatin) .... Take 1 Tablet By Mouth Once A Day 4)  Atenolol 50 Mg Tabs (Atenolol) .... Take 1 Tablet By Mouth Once A Day 5)  Diovan 80 Mg Tabs (Valsartan) .... Take 1 Tablet By Mouth Once A Day  Allergies: 1)  Lisinopril (Lisinopril)  Past History:  Past Medical History: Last updated: 08/17/2007 Diabetes mellitus, type II A1C below 7 Hyperlipidemia Hypertension Peripheral neuropathy Obestiy Bilateral Carpel Tunnel Constipation Anemia      s/p normal colonoscopy in 2008 Breast Cyst Cough ACEI  Risk Factors: Smoking Status:  quit (06/16/2009)  Review of Systems       per HPI  Physical Exam  General:  Well-developed,well-nourished,in no acute distress; alert,appropriate and cooperative throughout examination Lungs:  Normal respiratory effort, chest expands symmetrically. Lungs are clear to auscultation, no crackles or wheezes. Heart:  Normal rate and regular rhythm. S1 and S2 normal without gallop, murmur, click, rub or other extra sounds. Abdomen:  Bowel sounds positive,abdomen soft and non-tender without masses, organomegaly or hernias noted. Neurologic:  No cranial nerve deficits noted. Station and gait are normal. Plantar reflexes are down-going bilaterally. DTRs are symmetrical throughout. Sensory, motor and coordinative functions appear intact. Psych:  Cognition and judgment appear intact. Alert and cooperative with normal attention span and concentration. No apparent delusions, illusions, hallucinations  Diabetes Management Exam:    Foot Exam (with socks and/or shoes not present):       Sensory-Monofilament:          Left foot: normal          Right foot: normal   Impression & Recommendations:  Problem # 1:  RENAL FAILURE, CHRONIC (ICD-585.9)  Will check bmet today and reassess renal insufficiency.  Labs Reviewed: BUN: 24 (12/10/2008)   Cr: 1.38 (12/10/2008)    Hgb: 10.1 (12/10/2008)   Hct: 31.3 (12/10/2008)   Ca++: 10.0 (12/10/2008)    TP: 7.5 (12/10/2008)   Alb: 4.0 (12/10/2008)  Orders: T-Basic Metabolic Panel ([id number]) T-Urinalysis Dipstick only [id number])  Problem # 2:  ANEMIA, NORMOCYTIC (ICD-285.9)  Will follow up on Hg/HCT, last value around 10. Her hemoccult slides were all negative for blood. No recent symptoms reported.  Hgb: 10.1 (12/10/2008)   Hct: 31.3 (12/10/2008)   Platelets: 244 (12/10/2008) RBC: 3.62 (12/10/2008)   RDW: 12.7 (12/10/2008)   WBC: 6.5 (12/10/2008) MCV: 86.5 (12/10/2008)   MCHC: 32.3 (12/10/2008) Ferritin: 71 (04/22/2008) B12: 259 (04/22/2008)   TSH:  1.243 (08/04/2006)  Orders: T-CBC No Diff [id number])  Problem # 3:  HYPERTENSION (ICD-401.9) At goal, will continue the same regimen. Her updated medication list for this problem includes:    Norvasc 10 Mg Tabs (Amlodipine besylate) .Marland Kitchen... Take 1 tablet by mouth once a day    Atenolol 50 Mg Tabs (Atenolol) .Marland Kitchen... Take 1 tablet by mouth once a day    Diovan 80 Mg Tabs (Valsartan) .Marland Kitchen... Take 1 tablet by mouth once a day  BP today: 137/63 Prior BP: 160/73 (12/10/2008)  Labs Reviewed: K+: 4.7 (12/10/2008) Creat: : 1.38 (12/10/2008)   Chol: 265 (12/10/2008)   HDL: 52 (12/10/2008)   LDL: 179 (12/10/2008)   TG: 171 (12/10/2008)  Problem # 4:  HYPERLIPIDEMIA (ICD-272.4) Repeat today and readjust the regimen as indicated.  Her updated medication list for this problem includes:    Zocor 80 Mg Tabs (Simvastatin) .Marland Kitchen... Take 1 tablet by mouth once a day  Orders: T-Lipid Profile [id number])  Labs Reviewed: SGOT: 12 (12/10/2008)   SGPT: 8 (12/10/2008)   HDL:52 (12/10/2008), 56 (08/17/2007)  LDL:179 (12/10/2008), 95 (08/17/2007)  Chol:265 (12/10/2008), 171 (08/17/2007)  Trig:171 (12/10/2008), 99 (08/17/2007)  Problem # 5:  DIABETES MELLITUS, TYPE II (ICD-250.00) A1C at goal, Feet examined and the resulat are at baseline. She is due for eye doc appointment so will schedule today.  Her updated medication list for this problem includes:    Glucophage 1000 Mg Tabs (Metformin hcl) .Marland Kitchen... Take 1 tablet by mouth two times a day    Diovan 80 Mg Tabs (Valsartan) .Marland Kitchen... Take 1 tablet by mouth once a day  Orders: T- Capillary Blood Glucose [id number]) T-Hgb A1C (in-house) [id number]) Ophthalmology Referral (Ophthalmology)  Labs Reviewed: Creat: 1.38 (12/10/2008)     Last Eye Exam: Hypertensive retinopathy (06/27/2007) Reviewed HgBA1c results: 6.3 (06/16/2009)  6.8 (12/10/2008)  Complete Medication List: 1)  Glucophage 1000 Mg Tabs (Metformin hcl) .... Take 1 tablet by mouth two times a day 2)   Norvasc 10 Mg Tabs (Amlodipine besylate) .... Take 1 tablet by mouth once a day 3)  Zocor 80 Mg Tabs (Simvastatin) .... Take 1 tablet by mouth once a day 4)  Atenolol 50 Mg Tabs (Atenolol) .... Take 1 tablet by mouth once a day 5)  Diovan 80 Mg Tabs (Valsartan) .... Take 1 tablet by mouth once a day  Other Orders: Mammogram (Screening) (Mammo)  Patient Instructions: 1)  Please schedule a follow-up appointment in 6 months. 2)  Please check your blood pressure regularly, if it is >170 please call clinic at [phone] 3)  Please check your sugar levels regularly and remember to bring the meter with you to the next clinic appointment, if the sugars are > 350 or < 60 please call us at [phone] Process Orders Check Orders Results:     [facility]: ABN not required for this insurance Tests Sent for requisitioning (June 16, 2009 9:04 AM):     06/16/2009: [facility] -- T-Basic Metabolic Panel [id number] (signed)     06/16/2009: [facility] -- T-CBC No Diff [id number] (signed)     06/16/2009: [facility] -- T-Lipid Profile [id number] (signed)    Diabetic Foot Exam Foot Inspection Is there a history of a foot ulcer?              No Is there a foot ulcer now?              No Can the patient see the bottom of their feet?          No Are the shoes appropriate in style and fit?          No Is there swelling or an abnormal foot shape?          No Are the toenails long?                No Are the toenails thick?                No Are the toenails ingrown?              No Is there heavy callous build-up?              No Is there pain in the calf muscle (Intermittent claudication) when walking?    NoIs there a claw toe deformity?              No Is there elevated skin temperature?  No Is there limited ankle dorsiflexion?            No Is there foot or ankle muscle weakness?            No  Diabetic Foot Care Education  Patient educated on appropriate care of diabetic feet.  Pulse Check          Right Foot          Left Foot Posterior Tibial:        normal            normal Dorsalis Pedis:        normal            normal  High Risk Feet? No   10-g (5.07) Semmes-Weinstein Monofilament Test           Right Foot          Left Foot Visual Inspection     normal           normal Test Control      normal         normal Site 1         normal         normal Site 2         normal         normal Site 3         normal         normal Site 4         normal         normal Site 5         normal         normal Site 6         normal         normal Site 7         normal         normal Site 8         normal         normal Site 9         normal         normal Site 10         normal         normal  Impression      normal         normal  Legend:  Site 1 = Plantar aspect of first toe (center of pad) Site 2 = Plantar aspect of third toe (center of pad) Site 3 = Plantar aspect of fifth toe (center of pad) Site 4 = Plantar aspect of first metatarsal head Site 5 = Plantar aspect of third metatarsal head Site 6 = Plantar aspect of fifth metatarsal head Site 7 = Plantar aspect of medial midfoot Site 8 = Plantar aspect of lateral midfoot Site 9 = Plantar aspect of heel Site 10 = dorsal aspect of foot between the base of the first and second toes   Result is Abnormal if patient was unable to perceive the monofilament at site indicated.    Prevention & Chronic Care Immunizations   Influenza vaccine: Fluvax MCR  (12/10/2008)   Influenza vaccine deferral: Not indicated  (06/16/2009)    Tetanus booster: Not documented   Td booster deferral: Not indicated  (06/16/2009)    Pneumococcal vaccine: Not documented   Pneumococcal vaccine deferral: Not indicated  (06/16/2009)    H. zoster vaccine: Not documented   H. zoster vaccine deferral: Not indicated  (06/16/2009)  Colorectal Screening   Hemoccult: Not  documented   Hemoccult action/deferral: Not indicated  (06/16/2009)    Colonoscopy: Not documented   Colonoscopy action/deferral: Not indicated  (06/16/2009)  Other Screening   Pap smear: Not documented   Pap smear action/deferral: Deferred-3 yr interval  (06/16/2009)    Mammogram: ASSESSMENT: Negative - BI-RADS 1^MM DIGITAL SCREENING  (04/25/2008)   Mammogram action/deferral: Ordered  (06/16/2009)    DXA bone density scan: Not documented   DXA bone density action/deferral: Not indicated  (06/16/2009)   Smoking status: quit  (06/16/2009)  Diabetes Mellitus   HgbA1C: 6.3  (06/16/2009)    Eye exam: Hypertensive retinopathy  (06/27/2007)   Diabetic eye exam action/deferral: Ophthalmology referral  (06/16/2009)   Eye exam due: 07/2008    Foot exam: yes  (06/16/2009)   Foot exam action/deferral: Do today   High risk foot: No  (06/16/2009)   Foot care education: Done  (06/16/2009)    Urine microalbumin/creatinine ratio: 233.8  (08/04/2006)    Diabetes flowsheet reviewed?: Yes   Progress toward A1C goal: At goal  Lipids   Total Cholesterol: 265  (12/10/2008)   LDL: 179  (12/10/2008)   LDL Direct: Not documented   HDL: 52  (12/10/2008)   Triglycerides: 171  (12/10/2008)    SGOT (AST): 12  (12/10/2008)   SGPT (ALT): 8  (12/10/2008)   Alkaline phosphatase: 60  (12/10/2008)   Total bilirubin: 0.4  (12/10/2008)    Lipid flowsheet reviewed?: Yes   Progress toward LDL goal: Deteriorated  Hypertension   Last Blood Pressure: 137 / 63  (06/16/2009)   Serum creatinine: 1.38  (12/10/2008)   Serum potassium 4.7  (12/10/2008)    Hypertension flowsheet reviewed?: Yes   Progress toward BP goal: At goal  Self-Management Support :    Patient will work on the following items until the next clinic visit to reach self-care goals:     Medications and monitoring: take my medicines every day, check my blood sugar, bring all of my medications to every visit  (06/16/2009)     Eating:  eat more vegetables, use fresh or frozen vegetables, eat fruit for snacks and desserts, limit or avoid alcohol  (06/16/2009)     Activity: take the stairs instead of the elevator, park at the far end of the parking lot  (06/16/2009)    Diabetes self-management support: Written self-care plan, Education handout, Resources for patients handout  (06/16/2009)   Diabetes care plan printed   Diabetes education handout printed   Last diabetes self-management training by diabetes educator: 05/16/2008   Last medical nutrition therapy: 05/16/2008    Hypertension self-management support: Written self-care plan, Education handout, Resources for patients handout  (06/16/2009)   Hypertension self-care plan printed.   Hypertension education handout printed    Lipid self-management support: Written self-care plan, Education handout, Resources for patients handout  (06/16/2009)   Lipid self-care plan printed.   Lipid education handout printed      Resource handout printed.   Nursing Instructions: Schedule screening mammogram (see order) Refer for screening diabetic eye exam (see order) Diabetic foot exam today   Laboratory Results   Blood Tests   Date/Time Received: .Maryan Rued  June 16, 2009 8:54 AM   Date/Time Reported: .Maryan Rued  June 16, 2009 8:54 AM   HGBA1C: 6.3%   (Normal Range: Non-Diabetic - 3-6%   Control Diabetic - 6-8%) CBG Random:: 113mg /dL

## 2010-04-07 NOTE — Letter (Signed)
[facility]   Imported By: Enedina Finner 11/11/2009 10:54:52  _____________________________________________________________________  External Attachment:    Type:   Image     Comment:   External Document

## 2010-04-09 NOTE — Medication Information (Signed)
Summary: [facility]  [facility]   Imported By: Phillis Knack 03/30/2010 09:27:48  _____________________________________________________________________  External Attachment:    Type:   Image     Comment:   External Document

## 2010-05-07 HISTORY — PX: CATARACT EXTRACTION BILATERAL W/ ANTERIOR VITRECTOMY: SHX1304

## 2010-05-08 ENCOUNTER — Encounter: Payer: Self-pay | Admitting: Internal Medicine

## 2010-05-08 ENCOUNTER — Other Ambulatory Visit: Payer: Self-pay | Admitting: Internal Medicine

## 2010-05-08 ENCOUNTER — Other Ambulatory Visit: Payer: Medicare Other

## 2010-05-08 ENCOUNTER — Ambulatory Visit (INDEPENDENT_AMBULATORY_CARE_PROVIDER_SITE_OTHER): Payer: Medicare Other | Admitting: Internal Medicine

## 2010-05-08 DIAGNOSIS — I10 Essential (primary) hypertension: Secondary | ICD-10-CM

## 2010-05-08 DIAGNOSIS — N189 Chronic kidney disease, unspecified: Secondary | ICD-10-CM

## 2010-05-08 DIAGNOSIS — E119 Type 2 diabetes mellitus without complications: Secondary | ICD-10-CM

## 2010-05-08 DIAGNOSIS — E785 Hyperlipidemia, unspecified: Secondary | ICD-10-CM

## 2010-05-08 LAB — CREATININE, SERUM: Creatinine, Ser: 1.6 mg/dL — ABNORMAL HIGH (ref 0.4–1.2)

## 2010-05-08 LAB — HEMOGLOBIN A1C: Hgb A1c MFr Bld: 6.7 % — ABNORMAL HIGH (ref 4.6–6.5)

## 2010-05-14 NOTE — Assessment & Plan Note (Signed)
Summary: 3-4 MTH FU/STC   Vital Signs:  Patient profile:   70 year old female Height:      64 inches (162.56 cm) Weight:      213.4 pounds (97 kg) BMI:     36.76 O2 Sat:      98 % on Room air Temp:     98.2 degrees F (36.78 degrees C) oral Pulse rate:   84 / minute BP sitting:   160 / 72  (left arm) Cuff size:   large  Vitals Entered By: Tomma Lightning RMA (May 08, 2010 9:25 AM)  O2 Flow:  Room air CC: 3 month follow-up Is Patient Diabetic? Yes Did you bring your meter with you today? Yes Pain Assessment Patient in pain? no        Primary Care Provider:  Rowe Clack MD  CC:  3 month follow-up.  History of Present Illness: here for f/u  1) DM2 - reports compliance with ongoing medical treatment and no changes in medication dose or frequency. denies adverse side effects related to current therapy.   2) HTN - reports compliance with ongoing medical treatment and no changes in medication dose or frequency. denies adverse side effects related to current therapy.   3) dyslipidemia - reports compliance with ongoing medical treatment and no changes in medication dose or frequency. denies adverse side effects related to current therapy.   4) chronic anemia - colo 2008 - WNL - f/u 10y - no melena or BRBPR - no abd pain or dyspepsia - no NSAIDs use  Clinical Review Panels:  Lipid Management   Cholesterol:  233 (01/08/2010)   LDL (bad choesterol):  191 (06/17/2009)   HDL (good cholesterol):  50.40 (01/08/2010)  Diabetes Management   HgBA1C:  6.9 (01/08/2010)   Creatinine:  1.6 (01/08/2010)   Last Dilated Eye Exam:  No diabetic retinopathy.   Cataract.  OS  (07/02/2009)   Last Foot Exam:  yes (06/16/2009)   Last Flu Vaccine:  Fluvax 3+ (01/08/2010)  CBC   WBC:  5.6 (01/08/2010)   RBC:  3.21 (01/08/2010)   Hgb:  9.5 (01/08/2010)   Hct:  27.6 (01/08/2010)   Platelets:  172.0 (01/08/2010)   MCV  86.2 (01/08/2010)   MCHC  34.4 (01/08/2010)   RDW  13.1  (01/08/2010)   PMN:  49.7 (01/08/2010)   Lymphs:  35.0 (01/08/2010)   Monos:  11.4 (01/08/2010)   Eosinophils:  3.2 (01/08/2010)   Basophil:  0.7 (01/08/2010)  Complete Metabolic Panel   Glucose:  110 (06/17/2009)   Sodium:  141 (06/17/2009)   Potassium:  4.1 (06/17/2009)   Chloride:  107 (06/17/2009)   CO2:  25 (06/17/2009)   BUN:  19 (06/17/2009)   Creatinine:  1.6 (01/08/2010)   Albumin:  3.6 (01/08/2010)   Total Protein:  6.9 (01/08/2010)   Calcium:  10.0 (06/17/2009)   Total Bili:  0.5 (01/08/2010)   Alk Phos:  54 (01/08/2010)   SGPT (ALT):  12 (01/08/2010)   SGOT (AST):  17 (01/08/2010)   Current Medications (verified): 1)  Glucophage 1000 Mg Tabs (Metformin Hcl) .... Take 1 Tablet By Mouth Two Times A Day 2)  Norvasc 10 Mg Tabs (Amlodipine Besylate) .... Take 1 Tablet By Mouth Once A Day 3)  Zocor 40 Mg Tabs (Simvastatin) .... Take 1 Tab By Mouth At Bedtime 4)  Atenolol 50 Mg Tabs (Atenolol) .... Take 1 Tablet By Mouth Once A Day 5)  Diovan 80 Mg Tabs (Valsartan) .... Take  1 Tablet By Mouth Once A Day  Allergies (verified): 1)  Lisinopril (Lisinopril)  Past History:  Past Medical History: Diabetes mellitus, type II Hyperlipidemia Hypertension Peripheral neuropathy Obesity  Bilateral Carpel Tunnel Constipation Anemia      s/p normal colonoscopy in 2008 Breast Cyst Cough on ACEI  Review of Systems  The patient denies anorexia, fever, weight gain, chest pain, syncope, and abdominal pain.    Physical Exam  General:  overweight-appearing.  alert, well-developed, well-nourished, and cooperative to examination.   niece at side Lungs:  normal respiratory effort, no intercostal retractions or use of accessory muscles; normal breath sounds bilaterally - no crackles and no wheezes.    Heart:  fatty ankles; otherwise, normal rate, regular rhythm, no murmur, and no rub. BLE without edema.    Impression & Recommendations:  Problem # 1:  DIABETES MELLITUS, TYPE  II (ICD-250.00)  Her updated medication list for this problem includes:    Glucophage 1000 Mg Tabs (Metformin hcl) .Marland Kitchen... Take 1 tablet by mouth two times a day    Diovan 160 Mg Tabs (Valsartan) .Marland Kitchen... 1 by mouth once daily  Orders: TLB-A1C / Hgb A1C (Glycohemoglobin) (83036-A1C) TLB-Creatinine, Blood (82565-CREA)  Labs Reviewed: Creat: 1.6 (01/08/2010)     Last Eye Exam: No diabetic retinopathy.   Cataract.  OS   (07/02/2009) Reviewed HgBA1c results: 6.9 (01/08/2010)  6.3 (06/16/2009)  Problem # 2:  HYPERTENSION (ICD-401.9)  inc dose ARB now -  Her updated medication list for this problem includes:    Norvasc 10 Mg Tabs (Amlodipine besylate) .Marland Kitchen... Take 1 tablet by mouth once a day    Atenolol 50 Mg Tabs (Atenolol) .Marland Kitchen... Take 1 tablet by mouth once a day    Diovan 160 Mg Tabs (Valsartan) .Marland Kitchen... 1 by mouth once daily  Orders: TLB-Creatinine, Blood (82565-CREA) Prescription Created Electronically [id number])  BP today: 160/72 Prior BP: 132/72 (01/08/2010)  Labs Reviewed: K+: 4.1 (06/17/2009) Creat: : 1.6 (01/08/2010)   Chol: 233 (01/08/2010)   HDL: 50.40 (01/08/2010)   LDL: 191 (06/17/2009)   TG: 120.0 (01/08/2010)  Problem # 3:  HYPERLIPIDEMIA (ICD-272.4)  Her updated medication list for this problem includes:    Zocor 40 Mg Tabs (Simvastatin) .Marland Kitchen... Take 1 tab by mouth at bedtime  Labs Reviewed: SGOT: 17 (01/08/2010)   SGPT: 12 (01/08/2010)   HDL:50.40 (01/08/2010), 58 (06/17/2009)  LDL:191 (06/17/2009), 179 (12/10/2008)  Chol:233 (01/08/2010), 271 (06/17/2009)  Trig:120.0 (01/08/2010), 109 (06/17/2009)  Problem # 4:  RENAL FAILURE, CHRONIC (ICD-585.9)  Orders: TLB-Creatinine, Blood (82565-CREA)  Labs Reviewed: BUN: 19 (06/17/2009)   Cr: 1.6 (01/08/2010)    Hgb: 9.5 (01/08/2010)   Hct: 27.6 (01/08/2010)   Ca++: 10.0 (06/17/2009)    TP: 6.9 (01/08/2010)   Alb: 3.6 (01/08/2010)  Complete Medication List: 1)  Glucophage 1000 Mg Tabs (Metformin hcl) .... Take 1 tablet by  mouth two times a day 2)  Norvasc 10 Mg Tabs (Amlodipine besylate) .... Take 1 tablet by mouth once a day 3)  Zocor 40 Mg Tabs (Simvastatin) .... Take 1 tab by mouth at bedtime 4)  Atenolol 50 Mg Tabs (Atenolol) .... Take 1 tablet by mouth once a day 5)  Diovan 160 Mg Tabs (Valsartan) .Marland Kitchen.. 1 by mouth once daily  Patient Instructions: 1)  it was good to see you today. 2)  increase Diovan due to high blood pressure - your new prescription has been electronically submitted to your pharmacy. Please take as directed. Contact our office if you believe you're having problems with  the medication(s).  3)  test(s) ordered today - your results will be posted on the phone tree for review in 48-72 hours from the time of test completion; call [phone] and enter your 9 digit MRN (listed above on this page, just below your name); if any changes need to be made or there are abnormal results, you will be contacted directly.  4)  Please schedule a follow-up appointment in 4 months to monitor diabetes and blood pressure, call sooner if problems.  Prescriptions: DIOVAN 160 MG TABS (VALSARTAN) 1 by mouth once daily  #30 x 6   Entered and Authorized by:   Rowe Clack MD   Signed by:   Rowe Clack MD on 05/08/2010   Method used:   Electronically to        [id number] (retail)       [place], [place]  [id number]       Ph: [phone]       Fax: [phone]   [id number]    Orders Added: 1)  TLB-A1C / Hgb A1C (Glycohemoglobin) [83036-A1C] 2)  TLB-Creatinine, Blood [82565-CREA] 3)  Est. Patient Level IV [id number] 4)  Prescription Created Electronically [phone]

## 2010-05-27 LAB — GLUCOSE, CAPILLARY: Glucose-Capillary: 113 mg/dL — ABNORMAL HIGH (ref 70–99)

## 2010-06-08 LAB — COMPREHENSIVE METABOLIC PANEL
AST: 15 U/L (ref 0–37)
CO2: 26 mEq/L (ref 19–32)
Chloride: 107 mEq/L (ref 96–112)
Creatinine, Ser: 1.24 mg/dL — ABNORMAL HIGH (ref 0.4–1.2)
Glucose, Bld: 69 mg/dL — ABNORMAL LOW (ref 70–99)
Sodium: 141 mEq/L (ref 135–145)

## 2010-06-08 LAB — DIFFERENTIAL
Basophils Absolute: 0.1 10*3/uL (ref 0.0–0.1)
Eosinophils Absolute: 0.2 10*3/uL (ref 0.0–0.7)
Eosinophils Relative: 3 % (ref 0–5)
Lymphocytes Relative: 32 % (ref 12–46)

## 2010-06-08 LAB — CBC
HCT: 29.5 % — ABNORMAL LOW (ref 36.0–46.0)
Hemoglobin: 9.8 g/dL — ABNORMAL LOW (ref 12.0–15.0)
MCHC: 33.1 g/dL (ref 30.0–36.0)
MCV: 88.1 fL (ref 78.0–100.0)
RBC: 3.35 MIL/uL — ABNORMAL LOW (ref 3.87–5.11)
RDW: 13.4 % (ref 11.5–15.5)
WBC: 7 10*3/uL (ref 4.0–10.5)

## 2010-06-08 LAB — GLUCOSE, CAPILLARY: Glucose-Capillary: 70 mg/dL (ref 70–99)

## 2010-06-11 LAB — GLUCOSE, CAPILLARY: Glucose-Capillary: 82 mg/dL (ref 70–99)

## 2010-06-23 LAB — GLUCOSE, CAPILLARY: Glucose-Capillary: 96 mg/dL (ref 70–99)

## 2010-06-24 ENCOUNTER — Other Ambulatory Visit: Payer: Self-pay | Admitting: Internal Medicine

## 2010-06-24 NOTE — Telephone Encounter (Signed)
Ms. Cruey is not a pt here at the clinic; can u sent this back to the pharmacy,  Thanks

## 2010-07-21 ENCOUNTER — Other Ambulatory Visit: Payer: Self-pay | Admitting: Internal Medicine

## 2010-07-21 ENCOUNTER — Other Ambulatory Visit: Payer: Self-pay | Admitting: *Deleted

## 2010-07-21 MED ORDER — ATENOLOL 50 MG PO TABS: 50.0000 mg | ORAL_TABLET | Freq: Every day | ORAL | Status: DC

## 2010-07-21 MED ORDER — AMLODIPINE BESYLATE 10 MG PO TABS: 10.0000 mg | ORAL_TABLET | Freq: Every day | ORAL | Status: DC

## 2010-07-21 NOTE — Telephone Encounter (Signed)
Sent refills back to [facility] pharmacy

## 2010-07-24 NOTE — Discharge Summary (Signed)
NAME:  Erin Liu, Erin Liu                        ACCOUNT NO.:  [id number]   MEDICAL RECORD NO.:  [id number]                   PATIENT TYPE:  INP   LOCATION:  5036                                 FACILITY:  [facility]   PHYSICIAN:  Donathan Buller DICTATOR                    DATE OF BIRTH:  11/17/1940   DATE OF ADMISSION:  DATE OF DISCHARGE:  11/07/2001                                 DISCHARGE SUMMARY   DISCHARGE DIAGNOSES:  1. Breast mastitis.  2. Diabetes mellitus.  3. Hypertension.  4. Anemia.   DISCHARGE MEDICATIONS:  1. Tequin 400 mg p.o. q.d. times 14 days.  2. Flagyl 500 mg p.o. b.i.d. for 14 days.  3. Glucophage 1000 mg p.o. b.i.d.  4. Lisinopril 40 mg p.o. q.d.  5. Hydrochlorothiazide 25 mg p.o. q.d.   DISPOSITION AND FOLLOWUP:  1. The patient is discharged in stable condition on oral antibiotics.  2. She will be seen by Dr. Drue Flirt at [facility] outpatient clinic     at 2:30 on September 9.  At that time we will need to check a B-MET to     look at creatinine in particular since she was started on ACE inhibitors,     a CBC to check on white blood cells and CBG to check her glucose.   PROCEDURES PERFORMED:  Mammogram on August 27 of the left breast and an  ultrasound showed findings consistent with severe mastitis and no drainable  abscess.   CONSULTATIONS:  Diabetes coordinator.  We appreciate this consultation.   ADMITTING HISTORY AND PHYSICAL:  The patient is a 70 year-old female with a  past medical history significant for type 2 diabetes diagnosed in 1999 who  presented to the ED with a breast mass times two days.  She said that she  had developed cold like symptoms prior to admission including a runny nose,  dry cough, fever, fatigue and chills.  She went to the acute care clinic and  the medical worker squeezed this breast mass with a return of white purulent  material through the nipple.  Also the patient's blood sugar was checked at  that time and found to  be 562 and she was sent to the ED.  The patient was  given Ancef in the ED and normal saline and admitted for further workup.  Admission vital signs included a temperature of 99.4, blood pressure 163/86,  pulse 126, respiratory rate 34 and saturating 97% on room air.  Hemoglobin  10.8, white blood cells 14.7.  Chem-7:  132, 3.5, 96, 27, 13, 1.1 and blood  sugar 449.  Urine glucose of greater than 1000.   HOSPITAL COURSE:  1. Breast mass.  The mass was ultrasounded and shown not to have any     drainable component.  She was started on Unasyn to cover skin pathogens     and anaerobes.  Her fevers  continued and her white blood cells continued     to trend upwards and she was switched to Zosyn on August 26.  She     remained afebrile for the last 48 hours of her hospitalization.  Her     white blood cells started trending down, but on discharge were 13.3.  It     is felt with her improvement on clinical exam, less swelling and erythema     and with the patient being afebrile we could change to an oral antibiotic     regimen and send the patient home, so she is sent home on Tequin 400 mg     and Flagyl 500 mg p.o. b.i.d. both for 14 days.   1. Diabetes mellitus.  The patient was on Glucophage 500 mg b.i.d. but could     not afford her medications for financial reasons.  In the hospital she     was maintained on Glucophage 1000 mg p.o. b.i.d. and sliding scale     insulin with some resolution of her hyperglycemia. Her sugars were still     a little bit elevated.  It is believed that some of this elevation might     be due to the infected process that is going on.  The patient was sent     out on Glucophage 1000 mg p.o. b.i.d. and when seen in one week we will     adjust the diabetes.  Further of note, her HBA1c was 14.8.   1. Hypertension. The patient was started on lisinopril because of her     hypertension and diabetes.  Of note, her creatinine was 1.0.  She was     also started on  hydrochlorothiazide 25 mg p.o. q.d.  The pressure still     remained elevated, so she was also started on Norvasc 5 mg p.o. q.d.     This hypertension may be related in part again to her infection.  We will     send her out on lisinopril and hydrochlorothiazide and to restart Norvasc     if needed on an outpatient basis.  When she follows up we will need to     check a creatinine and potassium given that she has started these new     medications.   1. Anemia.  The patient underwent a workup which showed decreased iron,     decreased TIBC and decreased percent saturation, but increased Ferritin.     This was consistent with anemia of chronic disease or iron deficiency     anemia.  We would expect that the TIBC would be increased with iron     deficiency anemia likely anemia of chronic disease then.  The elevated     Ferritin is likely secondary as falsely for it is an acute phase     reactant.  Stool guaiacs are still pending. We will continue to monitor     her anemia and workup further as an outpatient.   1. Disposition of financial situation.  The patient cannot afford     medications.  Care management has been involved which we greatly     appreciate their help.  They will help her try and afford antibiotics     now.  She will get assistance with the family hopefully to afford her     medications.  The importance of taking her medications, particularly her     antibiotics, will be emphasized to the patient upon discharge.  We  will     work on an outpatient basis to get her affordable health care.   DISCHARGE LABORATORIES:  B-met:  Sodium 138, potassium 3.6, chloride 99,  carbon dioxide 29, glucose 200, BUN 11, creatinine 1.0.  CBC:  Hemoglobin  8.7, hematocrit 26.4, WBC 13.3, platelets 334,000.  Magnesium 1.8.  Also of  interest total cholesterol was 233, HDL 71, VLDL 18, LDL 144.  Glycosylated  hemoglobin 14.8.                                              Erin Liu DICTATOR     DD/MEDQ  D:  11/07/2001  T:  11/07/2001  Job:  [id number]

## 2010-08-03 ENCOUNTER — Other Ambulatory Visit: Payer: Self-pay | Admitting: Internal Medicine

## 2010-09-08 ENCOUNTER — Encounter: Payer: Self-pay | Admitting: Internal Medicine

## 2010-09-11 ENCOUNTER — Telehealth: Payer: Self-pay | Admitting: Internal Medicine

## 2010-09-11 ENCOUNTER — Other Ambulatory Visit: Payer: Self-pay | Admitting: Internal Medicine

## 2010-09-11 ENCOUNTER — Other Ambulatory Visit (INDEPENDENT_AMBULATORY_CARE_PROVIDER_SITE_OTHER): Payer: Medicare Other

## 2010-09-11 ENCOUNTER — Ambulatory Visit (INDEPENDENT_AMBULATORY_CARE_PROVIDER_SITE_OTHER): Payer: Medicare Other | Admitting: Internal Medicine

## 2010-09-11 ENCOUNTER — Encounter: Payer: Self-pay | Admitting: Internal Medicine

## 2010-09-11 ENCOUNTER — Ambulatory Visit: Payer: Medicare Other | Admitting: Internal Medicine

## 2010-09-11 DIAGNOSIS — E785 Hyperlipidemia, unspecified: Secondary | ICD-10-CM

## 2010-09-11 DIAGNOSIS — Z79899 Other long term (current) drug therapy: Secondary | ICD-10-CM

## 2010-09-11 DIAGNOSIS — E119 Type 2 diabetes mellitus without complications: Secondary | ICD-10-CM

## 2010-09-11 DIAGNOSIS — I10 Essential (primary) hypertension: Secondary | ICD-10-CM

## 2010-09-11 DIAGNOSIS — Z23 Encounter for immunization: Secondary | ICD-10-CM

## 2010-09-11 LAB — LIPID PANEL
Cholesterol: 300 mg/dL — ABNORMAL HIGH (ref 0–200)
HDL: 61 mg/dL (ref >39.00)
Triglycerides: 92 mg/dL (ref 0.0–149.0)

## 2010-09-11 LAB — HEPATIC FUNCTION PANEL
ALT: 13 U/L (ref 0–35)
AST: 18 U/L (ref 0–37)
Albumin: 4.2 g/dL (ref 3.5–5.2)

## 2010-09-11 LAB — LDL CHOLESTEROL, DIRECT: Direct LDL: 230.5 mg/dL

## 2010-09-11 MED ORDER — PNEUMOCOCCAL VAC POLYVALENT 25 MCG/0.5ML IJ INJ: 0.5000 mL | INJECTION | Freq: Once | INTRAMUSCULAR | Status: DC

## 2010-09-11 MED ORDER — ATORVASTATIN CALCIUM 40 MG PO TABS: 40.0000 mg | ORAL_TABLET | Freq: Every day | ORAL | Status: DC

## 2010-09-11 NOTE — Telephone Encounter (Signed)
Please call patient: tests show DM ok but cholesterol uncontrolled - she should stop simvastatin (prev cholesterol med) and start atorvastatin - erx done. No other treatment changes recommended. Patient to call if problems before next OV- thanks.

## 2010-09-11 NOTE — Progress Notes (Signed)
  Subjective:    Patient ID: Erin Liu, female    DOB: [birth year], 70 y.o.   MRN: [id number]  HPI here for follow up - reviewed chronic medical issues:  DM2 - reports compliance with ongoing medical treatment and no changes in medication dose or frequency. denies adverse side effects related to current therapy.   HTN - reports compliance with ongoing medical treatment. Increase ARB dose 05/2010 -denies adverse side effects related to current therapy.   dyslipidemia - on simva for years. reports compliance with ongoing medical treatment and no changes in medication dose or frequency. denies adverse side effects related to current therapy.   chronic anemia - colo 2008 - WNL - f/u 10y - no melena or BRBPR - no abd pain or dyspepsia - no NSAIDs use   Past Medical History  Diagnosis Date  . ANEMIA, NORMOCYTIC   . BREAST CYST, HX OF   . RENAL FAILURE, CHRONIC   . OBESITY   . HYPERTENSION   . HYPERLIPIDEMIA   . GLUCOMA   . DIABETES MELLITUS, TYPE II   . CARPAL TUNNEL SYNDROME, BILATERAL   . PERIPHERAL NEUROPATHY      Review of Systems  Respiratory: Negative for shortness of breath.   Cardiovascular: Negative for chest pain.       Objective:   Physical Exam BP 138/78  Pulse 83  Temp(Src) 97.7 F (36.5 C) (Oral)  Ht 5\' 4"  (1.626 m)  Wt 217 lb (98.431 kg)  BMI 37.25 kg/m2  SpO2 99% Wt Readings from Last 3 Encounters:  09/11/10 217 lb (98.431 kg)  05/08/10 213 lb 6.4 oz (96.798 kg)  01/08/10 215 lb 6.4 oz (97.705 kg)   Physical Exam  Constitutional: She is oriented to person, place, and time. She appears well-developed and well-nourished. No distress.  Cardiovascular: Normal rate, regular rhythm and normal heart sounds.  No murmur heard. No BLE edema. Pulmonary/Chest: Effort normal and breath sounds normal. No respiratory distress. She has no wheezes.  Psychiatric: She has a normal mood and affect. Her behavior is normal. Judgment and thought content normal.      Lab  Results  Component Value Date   WBC 5.6 01/08/2010   HGB 9.5* 01/08/2010   HCT 27.6* 01/08/2010   PLT 172.0 01/08/2010   CHOL 233* 01/08/2010   TRIG 120.0 01/08/2010   HDL 50.40 01/08/2010   LDLDIRECT 155.4 01/08/2010   ALT 12 01/08/2010   AST 17 01/08/2010   NA 141 06/17/2009   K 4.1 06/17/2009   CL 107 06/17/2009   CREATININE 1.6* 05/08/2010   BUN 19 06/17/2009   CO2 25 06/17/2009   TSH 1.243 08/04/2006   HGBA1C 6.7* 05/08/2010   MICROALBUR 40.50* 08/04/2006    Assessment & Plan:  See problem list. Medications and labs reviewed today.

## 2010-09-11 NOTE — Assessment & Plan Note (Addendum)
Not regularly checking CBGs at home Reviewed use of her glucometer and demo same in OV today Check a1c now, adjust meds if needed Currently metformin alone +ARB, statin; follows with eye doc regularly

## 2010-09-11 NOTE — Assessment & Plan Note (Signed)
Increase ARB 05/2010 - improved BP Readings from Last 3 Encounters:  09/11/10 138/78  05/08/10 160/72  01/08/10 132/72   The current medical regimen is effective;  continue present plan and medications.

## 2010-09-11 NOTE — Patient Instructions (Signed)
It was good to see you today. Test(s) ordered today. Your results will be called to you after review (48-72hours after test completion). If any changes need to be made, you will be notified at that time. Medications reviewed, no changes at this time - but will consider change in cholesterol medication depending on your lab results today Tetanus, diptheria, whooping cough and pneumonia vaccines given today (2 shots) Continue to follow with your eye doctor every year as we discussed Please schedule followup in 4 months, call sooner if problems.

## 2010-09-11 NOTE — Assessment & Plan Note (Signed)
On stain - last LDL not yet at goal Recheck now as pt reports compliance - consider change to atorva or other statin depending on results

## 2010-09-14 NOTE — Telephone Encounter (Signed)
Notified pt with labs results & md recommendations...09/14/10@10 :11am/LMB

## 2011-01-10 ENCOUNTER — Other Ambulatory Visit: Payer: Self-pay | Admitting: Internal Medicine

## 2011-01-12 ENCOUNTER — Ambulatory Visit (INDEPENDENT_AMBULATORY_CARE_PROVIDER_SITE_OTHER): Payer: Medicare Other | Admitting: Internal Medicine

## 2011-01-12 ENCOUNTER — Encounter: Payer: Self-pay | Admitting: Internal Medicine

## 2011-01-12 DIAGNOSIS — I10 Essential (primary) hypertension: Secondary | ICD-10-CM

## 2011-01-12 DIAGNOSIS — E119 Type 2 diabetes mellitus without complications: Secondary | ICD-10-CM

## 2011-01-12 DIAGNOSIS — G5600 Carpal tunnel syndrome, unspecified upper limb: Secondary | ICD-10-CM

## 2011-01-12 DIAGNOSIS — E785 Hyperlipidemia, unspecified: Secondary | ICD-10-CM

## 2011-01-12 DIAGNOSIS — Z23 Encounter for immunization: Secondary | ICD-10-CM

## 2011-01-12 NOTE — Assessment & Plan Note (Signed)
Check a1c now, adjust meds if needed Currently metformin alone +ARB, statin; follows with eye doc regularly Lab Results  Component Value Date   HGBA1C 6.9* 09/11/2010

## 2011-01-12 NOTE — Patient Instructions (Signed)
It was good to see you today. Medications reviewed, no changes at this time. Test(s) ordered today. Your results will be called to you after review (48-72hours after test completion). If any changes need to be made, you will be notified at that time. Flu shot done today Use wrist splint on right side at night when you sleep - then remove in the AM - call us if continued numbness in your hand despite this splint Please schedule followup in 4 months for diabetes mellitus check, call sooner if problems.

## 2011-01-12 NOTE — Assessment & Plan Note (Signed)
On stain changed from simva to atorva 09/2010 as last LDL not at goal Recheck now as pt reports compliance -

## 2011-01-12 NOTE — Progress Notes (Signed)
  Subjective:    Patient ID: Erin Liu, female    DOB: [birth year], 70 y.o.   MRN: [id number]  HPI  here for follow up - reviewed chronic medical issues:  DM2 - reports compliance with ongoing medical treatment and no changes in medication dose or frequency. denies adverse side effects related to current therapy.   HTN - reports compliance with ongoing medical treatment. Increase ARB dose 05/2010 -denies adverse side effects related to current therapy.   dyslipidemia - prev on simva but uncontrolled/compliacted by variable compliance; changed to atorva 09/2010. reports compliance with ongoing medical treatment. denies adverse side effects related to current therapy.   chronic anemia - colo 2008 - WNL - f/u 10y - no melena or BRBPR - no abd pain or dyspepsia - no NSAIDs use   complains of mild numbness in R hand upon waking in AM - index and thumb fingers, no pain  Past Medical History  Diagnosis Date  . ANEMIA, NORMOCYTIC   . BREAST CYST, HX OF   . RENAL FAILURE, CHRONIC   . OBESITY   . HYPERTENSION   . HYPERLIPIDEMIA   . GLUCOMA   . DIABETES MELLITUS, TYPE II   . CARPAL TUNNEL SYNDROME, BILATERAL   . PERIPHERAL NEUROPATHY      Review of Systems  Constitutional: Negative for fever and fatigue.  Respiratory: Negative for cough and shortness of breath.   Cardiovascular: Negative for chest pain and leg swelling.  Psychiatric/Behavioral: Negative for confusion. The patient is not nervous/anxious.        Objective:   Physical Exam  BP 130/70  Pulse 83  Temp(Src) 98 F (36.7 C) (Oral)  Ht 5\' 2"  (1.575 m)  Wt 222 lb 1.9 oz (100.753 kg)  BMI 40.63 kg/m2  SpO2 94% Wt Readings from Last 3 Encounters:  01/12/11 222 lb 1.9 oz (100.753 kg)  09/11/10 217 lb (98.431 kg)  05/08/10 213 lb 6.4 oz (96.798 kg)   Constitutional: She is overweight; appears well-developed and well-nourished. No distress.  Cardiovascular: Normal rate, regular rhythm and normal heart sounds.  No  murmur heard. No BLE edema. Pulmonary/Chest: Effort normal and breath sounds normal. No respiratory distress. She has no wheezes.  Mskel: R hand with + Tinnel's (CTS) Psychiatric: She has a normal mood and affect. Her behavior is normal. Judgment and thought content normal.  Skin - dry scaling BLE - no ulceration or rash     Lab Results  Component Value Date   WBC 5.6 01/08/2010   HGB 9.5* 01/08/2010   HCT 27.6* 01/08/2010   PLT 172.0 01/08/2010   CHOL 300* 09/11/2010   TRIG 92.0 09/11/2010   HDL 61.00 09/11/2010   LDLDIRECT 230.5 09/11/2010   ALT 13 09/11/2010   AST 18 09/11/2010   NA 141 06/17/2009   K 4.1 06/17/2009   CL 107 06/17/2009   CREATININE 1.6* 05/08/2010   BUN 19 06/17/2009   CO2 25 06/17/2009   TSH 1.243 08/04/2006   HGBA1C 6.9* 09/11/2010   MICROALBUR 40.50* 08/04/2006    Assessment & Plan:  See problem list. Medications and labs reviewed today.  R CTS, mild - conservative care with wrist splint qhs - explained same to pt - pt will call if symptoms worse despite splint use

## 2011-01-12 NOTE — Assessment & Plan Note (Signed)
Increased ARB dose in 05/2010 - improved BP Readings from Last 3 Encounters:  01/12/11 130/70  09/11/10 138/78  05/08/10 160/72   The current medical regimen is effective;  continue present plan and medications.

## 2011-04-13 ENCOUNTER — Encounter: Payer: Self-pay | Admitting: Internal Medicine

## 2011-04-19 ENCOUNTER — Encounter (INDEPENDENT_AMBULATORY_CARE_PROVIDER_SITE_OTHER): Payer: Medicare Other | Admitting: Ophthalmology

## 2011-04-19 DIAGNOSIS — E11359 Type 2 diabetes mellitus with proliferative diabetic retinopathy without macular edema: Secondary | ICD-10-CM

## 2011-04-19 DIAGNOSIS — I10 Essential (primary) hypertension: Secondary | ICD-10-CM

## 2011-04-19 DIAGNOSIS — H35039 Hypertensive retinopathy, unspecified eye: Secondary | ICD-10-CM

## 2011-04-19 DIAGNOSIS — E1165 Type 2 diabetes mellitus with hyperglycemia: Secondary | ICD-10-CM

## 2011-04-19 DIAGNOSIS — H3581 Retinal edema: Secondary | ICD-10-CM

## 2011-04-19 DIAGNOSIS — H35379 Puckering of macula, unspecified eye: Secondary | ICD-10-CM

## 2011-04-28 ENCOUNTER — Ambulatory Visit (INDEPENDENT_AMBULATORY_CARE_PROVIDER_SITE_OTHER): Payer: Medicare Other | Admitting: Ophthalmology

## 2011-04-28 DIAGNOSIS — H3581 Retinal edema: Secondary | ICD-10-CM

## 2011-05-12 ENCOUNTER — Ambulatory Visit: Payer: Medicare Other | Admitting: Internal Medicine

## 2011-07-11 ENCOUNTER — Other Ambulatory Visit: Payer: Self-pay | Admitting: Internal Medicine

## 2011-08-13 ENCOUNTER — Other Ambulatory Visit: Payer: Self-pay | Admitting: Internal Medicine

## 2011-08-13 NOTE — Telephone Encounter (Signed)
Done per emr 

## 2011-08-26 ENCOUNTER — Ambulatory Visit (INDEPENDENT_AMBULATORY_CARE_PROVIDER_SITE_OTHER): Payer: Medicare Other | Admitting: Ophthalmology

## 2011-09-08 ENCOUNTER — Ambulatory Visit (INDEPENDENT_AMBULATORY_CARE_PROVIDER_SITE_OTHER): Payer: Medicare Other | Admitting: Ophthalmology

## 2011-09-15 ENCOUNTER — Other Ambulatory Visit: Payer: Self-pay | Admitting: Internal Medicine

## 2011-09-20 ENCOUNTER — Ambulatory Visit (INDEPENDENT_AMBULATORY_CARE_PROVIDER_SITE_OTHER): Payer: Medicare Other | Admitting: Ophthalmology

## 2011-09-20 DIAGNOSIS — Q143 Congenital malformation of choroid: Secondary | ICD-10-CM

## 2011-09-20 DIAGNOSIS — E1165 Type 2 diabetes mellitus with hyperglycemia: Secondary | ICD-10-CM

## 2011-09-20 DIAGNOSIS — I10 Essential (primary) hypertension: Secondary | ICD-10-CM

## 2011-09-20 DIAGNOSIS — E11359 Type 2 diabetes mellitus with proliferative diabetic retinopathy without macular edema: Secondary | ICD-10-CM

## 2011-09-20 DIAGNOSIS — H43819 Vitreous degeneration, unspecified eye: Secondary | ICD-10-CM

## 2011-09-20 DIAGNOSIS — H35039 Hypertensive retinopathy, unspecified eye: Secondary | ICD-10-CM

## 2011-12-09 ENCOUNTER — Other Ambulatory Visit: Payer: Self-pay | Admitting: Internal Medicine

## 2012-03-21 ENCOUNTER — Other Ambulatory Visit: Payer: Self-pay | Admitting: Internal Medicine

## 2012-03-22 ENCOUNTER — Ambulatory Visit (INDEPENDENT_AMBULATORY_CARE_PROVIDER_SITE_OTHER): Payer: Medicare Other | Admitting: Ophthalmology

## 2012-04-17 ENCOUNTER — Other Ambulatory Visit: Payer: Self-pay | Admitting: Internal Medicine

## 2012-05-10 ENCOUNTER — Other Ambulatory Visit (INDEPENDENT_AMBULATORY_CARE_PROVIDER_SITE_OTHER): Payer: Medicare Other

## 2012-05-10 ENCOUNTER — Encounter: Payer: Self-pay | Admitting: Internal Medicine

## 2012-05-10 ENCOUNTER — Ambulatory Visit (INDEPENDENT_AMBULATORY_CARE_PROVIDER_SITE_OTHER): Payer: Medicare Other | Admitting: Internal Medicine

## 2012-05-10 VITALS — BP 132/60 | HR 95 | Temp 96.8°F | Ht 62.0 in | Wt 213.6 lb

## 2012-05-10 DIAGNOSIS — N058 Unspecified nephritic syndrome with other morphologic changes: Secondary | ICD-10-CM

## 2012-05-10 DIAGNOSIS — D649 Anemia, unspecified: Secondary | ICD-10-CM

## 2012-05-10 DIAGNOSIS — E785 Hyperlipidemia, unspecified: Secondary | ICD-10-CM

## 2012-05-10 DIAGNOSIS — I10 Essential (primary) hypertension: Secondary | ICD-10-CM

## 2012-05-10 DIAGNOSIS — E1365 Other specified diabetes mellitus with hyperglycemia: Secondary | ICD-10-CM

## 2012-05-10 DIAGNOSIS — R5381 Other malaise: Secondary | ICD-10-CM

## 2012-05-10 LAB — CBC WITH DIFFERENTIAL/PLATELET
Basophils Absolute: 0 10*3/uL (ref 0.0–0.1)
Basophils Relative: 0.5 % (ref 0.0–3.0)
Eosinophils Relative: 2.5 % (ref 0.0–5.0)
Hemoglobin: 9.9 g/dL — ABNORMAL LOW (ref 12.0–15.0)
Lymphocytes Relative: 32.9 % (ref 12.0–46.0)
Monocytes Relative: 9.6 % (ref 3.0–12.0)
Neutro Abs: 3.6 10*3/uL (ref 1.4–7.7)
RBC: 3.43 Mil/uL — ABNORMAL LOW (ref 3.87–5.11)
WBC: 6.6 10*3/uL (ref 4.5–10.5)

## 2012-05-10 LAB — BASIC METABOLIC PANEL
Calcium: 9.7 mg/dL (ref 8.4–10.5)
GFR: 31.67 mL/min — ABNORMAL LOW (ref >60.00)
Glucose, Bld: 89 mg/dL (ref 70–99)
Potassium: 4.9 mEq/L (ref 3.5–5.1)
Sodium: 139 mEq/L (ref 135–145)

## 2012-05-10 LAB — LIPID PANEL
LDL Cholesterol: 98 mg/dL (ref 0–99)
Total CHOL/HDL Ratio: 3

## 2012-05-10 LAB — FERRITIN: Ferritin: 70.2 ng/mL (ref 10.0–291.0)

## 2012-05-10 LAB — HEPATIC FUNCTION PANEL
AST: 21 U/L (ref 0–37)
Albumin: 3.8 g/dL (ref 3.5–5.2)
Alkaline Phosphatase: 59 U/L (ref 39–117)
Bilirubin, Direct: 0.1 mg/dL (ref 0.0–0.3)
Total Bilirubin: 0.7 mg/dL (ref 0.3–1.2)

## 2012-05-10 LAB — MICROALBUMIN / CREATININE URINE RATIO
Creatinine,U: 160.6 mg/dL
Microalb Creat Ratio: 6.4 mg/g (ref 0.0–30.0)
Microalb, Ur: 10.3 mg/dL — ABNORMAL HIGH (ref 0.0–1.9)

## 2012-05-10 MED ORDER — VALSARTAN 160 MG PO TABS: ORAL_TABLET | ORAL | Status: DC

## 2012-05-10 MED ORDER — ATENOLOL 50 MG PO TABS: ORAL_TABLET | ORAL | Status: DC

## 2012-05-10 MED ORDER — AMLODIPINE BESYLATE 10 MG PO TABS: ORAL_TABLET | ORAL | Status: DC

## 2012-05-10 NOTE — Assessment & Plan Note (Signed)
Increased ARB dose in 05/2010 - improved, but ?compliance  BP Readings from Last 3 Encounters:  05/10/12 132/60  01/12/11 130/70  09/11/10 138/78   The current medical regimen appears effective;  continue present plan and medications.

## 2012-05-10 NOTE — Assessment & Plan Note (Signed)
Check a1c now, adjust meds if needed Currently metformin alone +ARB, statin; follows with eye doc regularly Lab Results  Component Value Date   HGBA1C 6.9* 09/11/2010

## 2012-05-10 NOTE — Progress Notes (Signed)
Subjective:    Patient ID: Erin Liu, female    DOB: 11-Sep-1940, 72 y.o.   MRN: [id number]  HPI  here for follow up - last OV 01/2011 (>12 mo)   Here for medicare wellness  Diet: heart healthy and diabetic Physical activity: sedentary Depression/mood screen: negative Hearing: intact to whispered voice Visual acuity: grossly normal, performs annual eye exam  ADLs: capable Fall risk: none Home safety: good Cognitive evaluation: intact to orientation, naming, recall and repetition EOL planning: adv directives, full code/ I agree  I have personally reviewed and have noted 1. The patient's medical and social history 2. Their use of alcohol, tobacco or illicit drugs 3. Their current medications and supplements 4. The patient's functional ability including ADL's, fall risks, home safety risks and hearing or visual impairment. 5. Diet and physical activities 6. Evidence for depression or mood disorders  also reviewed chronic medical issues:  DM2 - reports variable compliance with ongoing medical treatment. no prescribed changes in medication dose or frequency. denies adverse side effects related to current therapy.   HTN - reports noncompliance with prescribed medical treatment. Increase ARB dose 05/2010 -denies adverse side effects related to prescribed therapy.   dyslipidemia - prev on simva but uncontrolled/compliacted by variable compliance; changed to atorva 09/2010. reports variable compliance with ongoing medical treatment. denies adverse side effects related to current therapy.   chronic anemia - colo 2008 - WNL - f/u 56yr recommended - no melena or BRBPR - no abdominal pain or dyspepsia - no NSAIDs use    Past Medical History  Diagnosis Date  . ANEMIA, NORMOCYTIC   . BREAST CYST, HX OF   . RENAL FAILURE, CHRONIC   . OBESITY   . HYPERTENSION   . HYPERLIPIDEMIA   . GLUCOMA   . DIABETES MELLITUS, TYPE II   . CARPAL TUNNEL SYNDROME, BILATERAL   . PERIPHERAL  NEUROPATHY    Family History  Problem Relation Age of Onset  . Arthritis Other   . Alcohol abuse Other   . Diabetes Other     parent, grandparent  . Hypertension Other     parent, grandparent and other   History  Substance Use Topics  . Smoking status: Never Smoker   . Smokeless tobacco: Not on file     Comment: Widowed, lives with niece and neice spouse. Retired Medical illustrator  . Alcohol Use: No    Review of Systems  Constitutional: Positive for fatigue. Negative for fever and unexpected weight change.  Respiratory: Negative for cough and shortness of breath.   Cardiovascular: Negative for chest pain and leg swelling.  Neurological: Positive for tremors (occ r hand only "when nervous"). Negative for seizures, weakness, numbness and headaches.  Psychiatric/Behavioral: Negative for confusion. The patient is not nervous/anxious.   No other specific complaints in a complete review of systems (except as listed in HPI above).      Objective:   Physical Exam  BP 132/60  Pulse 95  Temp(Src) 96.8 F (36 C) (Oral)  Ht 5\' 2"  (1.575 m)  Wt 213 lb 9.6 oz (96.888 kg)  BMI 39.06 kg/m2  SpO2 98% Wt Readings from Last 3 Encounters:  05/10/12 213 lb 9.6 oz (96.888 kg)  01/12/11 222 lb 1.9 oz (100.753 kg)  09/11/10 217 lb (98.431 kg)   Constitutional: She is overweight, but appears well-developed and well-nourished. No distress.  HENT: Head: Normocephalic and atraumatic. Ears: B TMs ok, no erythema or effusion; Nose: Nose normal. Mouth/Throat: Oropharynx is clear and  moist. No oropharyngeal exudate.  Eyes: Conjunctivae and EOM are normal. Pupils are equal, round, and reactive to light. No scleral icterus.  Neck: Normal range of motion. Neck supple. No JVD present. No thyromegaly present.  Cardiovascular: Normal rate, regular rhythm and normal heart sounds.  No murmur heard. No BLE edema. Pulmonary/Chest: Effort normal and breath sounds normal. No respiratory distress. She has no  wheezes.  Abdominal: Soft. Bowel sounds are normal. She exhibits no distension. There is no tenderness. no masses Musculoskeletal: Normal range of motion, no joint effusions. No gross deformities Neurological: She is alert and oriented to person, place, and time. No cranial nerve deficit. Coordination normal. Mild intermittent resting tremor r hand Skin: Skin is warm and dry. No rash noted. No erythema.  Psychiatric: She has a normal mood and affect. Her behavior is normal. Judgment and thought content normal.       Lab Results  Component Value Date   WBC 5.6 01/08/2010   HGB 9.5* 01/08/2010   HCT 27.6* 01/08/2010   PLT 172.0 01/08/2010   CHOL 300* 09/11/2010   TRIG 92.0 09/11/2010   HDL 61.00 09/11/2010   LDLDIRECT 230.5 09/11/2010   ALT 13 09/11/2010   AST 18 09/11/2010   NA 141 06/17/2009   K 4.1 06/17/2009   CL 107 06/17/2009   CREATININE 1.6* 05/08/2010   BUN 19 06/17/2009   CO2 25 06/17/2009   TSH 1.243 08/04/2006   HGBA1C 6.9* 09/11/2010   MICROALBUR 40.50* 08/04/2006   ECG: sinus @ 90bpm - LBBB -no prior ECG on file  Assessment & Plan:  AWV/CPX/v70.0 - Today patient counseled on age appropriate routine health concerns for screening and prevention, each reviewed and up to date or declined. Immunizations reviewed and up to date or declined. Labs/ECG reviewed. Risk factors for depression reviewed and negative. Hearing function and visual acuity are intact. ADLs screened and addressed as needed. Functional ability and level of safety reviewed and appropriate. Education, counseling and referrals performed based on assessed risks today. Patient provided with a copy of personalized plan for preventive services.  Fatigue - nonspecific symptoms/exam - check screening labs  Also see problem list. Medications and labs reviewed today.

## 2012-05-10 NOTE — Assessment & Plan Note (Signed)
Colo 2008 WNL - recommended 90yr follow up Recheck now with iron/ferritin

## 2012-05-10 NOTE — Patient Instructions (Addendum)
It was good to see you today. We have reviewed your prior records including labs and tests today Health Maintenance reviewed - all recommended immunizations and age-appropriate screenings are up-to-date. Test(s) ordered today. Your results will be released to [place] (or called to you) after review, usually within 72hours after test completion. If any changes need to be made, you will be notified at that same time. Medications reviewed, no changes at this time. Refill on medication(s) as discussed today. Please schedule followup in 3-6 months for blood pressure and diabetes mellitus check, call sooner if problems. Health Maintenance, Females A healthy lifestyle and preventative care can promote health and wellness.  Maintain regular health, dental, and eye exams.  Eat a healthy diet. Foods like vegetables, fruits, whole grains, low-fat dairy products, and lean protein foods contain the nutrients you need without too many calories. Decrease your intake of foods high in solid fats, added sugars, and salt. Get information about a proper diet from your caregiver, if necessary.  Regular physical exercise is one of the most important things you can do for your health. Most adults should get at least 150 minutes of moderate-intensity exercise (any activity that increases your heart rate and causes you to sweat) each week. In addition, most adults need muscle-strengthening exercises on 2 or more days a week.   Maintain a healthy weight. The body mass index (BMI) is a screening tool to identify possible weight problems. It provides an estimate of body fat based on height and weight. Your caregiver can help determine your BMI, and can help you achieve or maintain a healthy weight. For adults 20 years and older:  A BMI below 18.5 is considered underweight.  A BMI of 18.5 to 24.9 is normal.  A BMI of 25 to 29.9 is considered overweight.  A BMI of 30 and above is considered obese.  Maintain normal  blood lipids and cholesterol by exercising and minimizing your intake of saturated fat. Eat a balanced diet with plenty of fruits and vegetables. Blood tests for lipids and cholesterol should begin at age 80 and be repeated every 5 years. If your lipid or cholesterol levels are high, you are over 50, or you are a high risk for heart disease, you may need your cholesterol levels checked more frequently.Ongoing high lipid and cholesterol levels should be treated with medicines if diet and exercise are not effective.  If you smoke, find out from your caregiver how to quit. If you do not use tobacco, do not start.  If you are pregnant, do not drink alcohol. If you are breastfeeding, be very cautious about drinking alcohol. If you are not pregnant and choose to drink alcohol, do not exceed 1 drink per day. One drink is considered to be 12 ounces (355 mL) of beer, 5 ounces (148 mL) of wine, or 1.5 ounces (44 mL) of liquor.  Avoid use of street drugs. Do not share needles with anyone. Ask for help if you need support or instructions about stopping the use of drugs.  High blood pressure causes heart disease and increases the risk of stroke. Blood pressure should be checked at least every 1 to 2 years. Ongoing high blood pressure should be treated with medicines, if weight loss and exercise are not effective.  If you are 33 to 72 years old, ask your caregiver if you should take aspirin to prevent strokes.  Diabetes screening involves taking a blood sample to check your fasting blood sugar level. This should be done once  every 3 years, after age 28, if you are within normal weight and without risk factors for diabetes. Testing should be considered at a younger age or be carried out more frequently if you are overweight and have at least 1 risk factor for diabetes.  Breast cancer screening is essential preventative care for women. You should practice "breast self-awareness." This means understanding the normal  appearance and feel of your breasts and may include breast self-examination. Any changes detected, no matter how small, should be reported to a caregiver. Women in their 31s and 30s should have a clinical breast exam (CBE) by a caregiver as part of a regular health exam every 1 to 3 years. After age 108, women should have a CBE every year. Starting at age 30, women should consider having a mammogram (breast X-ray) every year. Women who have a family history of breast cancer should talk to their caregiver about genetic screening. Women at a high risk of breast cancer should talk to their caregiver about having an MRI and a mammogram every year.  The Pap test is a screening test for cervical cancer. Women should have a Pap test starting at age 56. Between ages 22 and 33, Pap tests should be repeated every 2 years. Beginning at age 50, you should have a Pap test every 3 years as long as the past 3 Pap tests have been normal. If you had a hysterectomy for a problem that was not cancer or a condition that could lead to cancer, then you no longer need Pap tests. If you are between ages 64 and 46, and you have had normal Pap tests going back 10 years, you no longer need Pap tests. If you have had past treatment for cervical cancer or a condition that could lead to cancer, you need Pap tests and screening for cancer for at least 20 years after your treatment. If Pap tests have been discontinued, risk factors (such as a new sexual partner) need to be reassessed to determine if screening should be resumed. Some women have medical problems that increase the chance of getting cervical cancer. In these cases, your caregiver may recommend more frequent screening and Pap tests.  The human papillomavirus (HPV) test is an additional test that may be used for cervical cancer screening. The HPV test looks for the virus that can cause the cell changes on the cervix. The cells collected during the Pap test can be tested for HPV. The  HPV test could be used to screen women aged 68 years and older, and should be used in women of any age who have unclear Pap test results. After the age of 33, women should have HPV testing at the same frequency as a Pap test.  Colorectal cancer can be detected and often prevented. Most routine colorectal cancer screening begins at the age of 75 and continues through age 74. However, your caregiver may recommend screening at an earlier age if you have risk factors for colon cancer. On a yearly basis, your caregiver may provide home test kits to check for hidden blood in the stool. Use of a small camera at the end of a tube, to directly examine the colon (sigmoidoscopy or colonoscopy), can detect the earliest forms of colorectal cancer. Talk to your caregiver about this at age 84, when routine screening begins. Direct examination of the colon should be repeated every 5 to 10 years through age 49, unless early forms of pre-cancerous polyps or small growths are found.  Hepatitis  C blood testing is recommended for all people born from [birth year] through 1965 and any individual with known risks for hepatitis C.  Practice safe sex. Use condoms and avoid high-risk sexual practices to reduce the spread of sexually transmitted infections (STIs). Sexually active women aged 78 and younger should be checked for Chlamydia, which is a common sexually transmitted infection. Older women with new or multiple partners should also be tested for Chlamydia. Testing for other STIs is recommended if you are sexually active and at increased risk.  Osteoporosis is a disease in which the bones lose minerals and strength with aging. This can result in serious bone fractures. The risk of osteoporosis can be identified using a bone density scan. Women ages 54 and over and women at risk for fractures or osteoporosis should discuss screening with their caregivers. Ask your caregiver whether you should be taking a calcium supplement or vitamin D  to reduce the rate of osteoporosis.  Menopause can be associated with physical symptoms and risks. Hormone replacement therapy is available to decrease symptoms and risks. You should talk to your caregiver about whether hormone replacement therapy is right for you.  Use sunscreen with a sun protection factor (SPF) of 30 or greater. Apply sunscreen liberally and repeatedly throughout the day. You should seek shade when your shadow is shorter than you. Protect yourself by wearing long sleeves, pants, a wide-brimmed hat, and sunglasses year round, whenever you are outdoors.  Notify your caregiver of new moles or changes in moles, especially if there is a change in shape or color. Also notify your caregiver if a mole is larger than the size of a pencil eraser.  Stay current with your immunizations. Document Released: 09/07/2010 Document Revised: 05/17/2011 Document Reviewed: 09/07/2010 [facility] Patient Information 2013 [place]. Type 2 Diabetes Diabetes is a long-lasting (chronic) disease. One or both of the following happen with type 2 diabetes:   The pancreas does not make enough of a hormone called insulin.  The body has trouble using the insulin that is made. HOME CARE  Check your blood sugar (glucose) once a day, or as told by your doctor.  Take all medicine as told by your doctor.  Do not smoke.  Eat healthy foods. Weight loss can help your diabetes.  Learn about low blood sugar (hypoglycemia). Know how to treat it.  Get your eyes checked on a regular basis.  Get a physical exam every year. Get your blood pressure checked. Get your blood and pee (urine) tested.  Wear a necklace or bracelet that says you have diabetes.  Check your feet every night for cuts, sores, blisters, and redness. Tell your doctor if you have problems. GET HELP RIGHT AWAY IF:  You have trouble keeping your blood sugar in target range.  You have problems with your medicines.  You are sick and  not getting better after 24 hours.  You have a sore or wound that is not healing.  You have vision problems or changes.  You have a fever. MAKE SURE YOU:  Understand these instructions.  Will watch your condition.  Will get help right away if you are not doing well or get worse. Document Released: 12/02/2007 Document Revised: 05/17/2011 Document Reviewed: 08/10/2010 [facility] Patient Information 2013 [place].

## 2012-05-10 NOTE — Assessment & Plan Note (Signed)
On stain: changed from simva to atorva 09/2010 as last LDL not at goal Pt reports variable compliance Recheck now and resume med, refills provided Educated on importance of med, diet compliance

## 2012-08-14 ENCOUNTER — Other Ambulatory Visit: Payer: Self-pay | Admitting: Internal Medicine

## 2012-11-10 ENCOUNTER — Other Ambulatory Visit (INDEPENDENT_AMBULATORY_CARE_PROVIDER_SITE_OTHER): Payer: Medicare Other

## 2012-11-10 ENCOUNTER — Ambulatory Visit (INDEPENDENT_AMBULATORY_CARE_PROVIDER_SITE_OTHER): Payer: Medicare Other | Admitting: Internal Medicine

## 2012-11-10 ENCOUNTER — Encounter: Payer: Self-pay | Admitting: Internal Medicine

## 2012-11-10 VITALS — BP 140/68 | HR 83 | Temp 97.3°F | Wt 223.8 lb

## 2012-11-10 DIAGNOSIS — Z23 Encounter for immunization: Secondary | ICD-10-CM

## 2012-11-10 DIAGNOSIS — E1329 Other specified diabetes mellitus with other diabetic kidney complication: Secondary | ICD-10-CM

## 2012-11-10 DIAGNOSIS — N058 Unspecified nephritic syndrome with other morphologic changes: Secondary | ICD-10-CM

## 2012-11-10 DIAGNOSIS — H409 Unspecified glaucoma: Secondary | ICD-10-CM

## 2012-11-10 DIAGNOSIS — E785 Hyperlipidemia, unspecified: Secondary | ICD-10-CM

## 2012-11-10 DIAGNOSIS — I10 Essential (primary) hypertension: Secondary | ICD-10-CM

## 2012-11-10 LAB — BASIC METABOLIC PANEL
BUN: 35 mg/dL — ABNORMAL HIGH (ref 6–23)
GFR: 34.62 mL/min — ABNORMAL LOW (ref >60.00)
Potassium: 4.6 mEq/L (ref 3.5–5.1)

## 2012-11-10 MED ORDER — PIOGLITAZONE HCL 30 MG PO TABS: 30.0000 mg | ORAL_TABLET | Freq: Every day | ORAL | Status: DC

## 2012-11-10 NOTE — Assessment & Plan Note (Signed)
Increased ARB dose in 05/2010 - improved, but ?compliance  BP Readings from Last 3 Encounters:  11/10/12 140/68  05/10/12 132/60  01/12/11 130/70   The current medical regimen appears generally effective;  continue present plan and medications.

## 2012-11-10 NOTE — Assessment & Plan Note (Signed)
On stain: changed from simva to atorva 09/2010  Pt reports variable compliance check annually and adjust prn Educated on importance of med, diet compliance

## 2012-11-10 NOTE — Patient Instructions (Addendum)
It was good to see you today. We have reviewed your prior records including labs and tests today Flu shot updated today Test(s) ordered today. Your results will be released to [place] (or called to you) after review, usually within 72hours after test completion. If any changes need to be made, you will be notified at that same time. Medications reviewed :STOP METFORMIN -  start Actos once daily for diabetes mellitus in place of metformin No other medication changes at this time. we'll make referral to Dr Zigmund Daniel for eye check. Our office will contact you regarding appointment(s) once made. Please schedule followup in 6 months for blood pressure and diabetes mellitus check, call sooner if problems.

## 2012-11-10 NOTE — Progress Notes (Signed)
  Subjective:    Patient ID: Erin Liu, female    DOB: 1940-04-24, 72 y.o.   MRN: [id number]  HPI here for follow up  - reviewed chronic medical issues and interval medical events:  DM2 - reports variable compliance with ongoing medical treatment. no prescribed changes in medication dose or frequency. denies adverse side effects related to current therapy.   HTN - reports variable compliance with prescribed medical treatment. Increase ARB dose 05/2010 -denies adverse side effects related to prescribed therapy.   dyslipidemia - prev on simva but changed to atorva 09/2010. reports variable compliance with ongoing medical treatment. denies adverse side effects related to current therapy.   chronic anemia - colo 2008 - WNL - follow up 81yr recommended - no melena or BRBPR - no abdominal pain or dyspepsia - no NSAIDs use    Past Medical History  Diagnosis Date  . ANEMIA, NORMOCYTIC   . BREAST CYST, HX OF   . RENAL FAILURE, CHRONIC   . OBESITY   . HYPERTENSION   . HYPERLIPIDEMIA   . GLUCOMA   . DIABETES MELLITUS, TYPE II   . CARPAL TUNNEL SYNDROME, BILATERAL   . PERIPHERAL NEUROPATHY     Review of Systems  Constitutional: Positive for fatigue. Negative for fever and unexpected weight change.  Respiratory: Negative for cough and shortness of breath.   Cardiovascular: Negative for chest pain and leg swelling.  Neurological: Positive for tremors (chronic r hand only "especially when I am nervous"). Negative for weakness and headaches.        Objective:   Physical Exam BP 140/68  Pulse 83  Temp(Src) 97.3 F (36.3 C) (Oral)  Wt 223 lb 12.8 oz (101.515 kg)  BMI 40.92 kg/m2  SpO2 99% Wt Readings from Last 3 Encounters:  11/10/12 223 lb 12.8 oz (101.515 kg)  05/10/12 213 lb 9.6 oz (96.888 kg)  01/12/11 222 lb 1.9 oz (100.753 kg)   Constitutional: She is overweight, but appears well-developed and well-nourished. No distress.  Neck: Normal range of motion. Neck supple. No JVD  or LAD present. No thyromegaly present.  Cardiovascular: Normal rate, regular rhythm and normal heart sounds.  No murmur heard. No BLE edema. Pulmonary/Chest: Effort normal and breath sounds normal. No respiratory distress. She has no wheezes.  Neurological: She is alert and oriented to person, place, and time. No cranial nerve deficit. Coordination and gait are normal. Mild intermittent resting tremor r hand Skin: Skin is warm and dry. No rash noted. No erythema.  Psychiatric: She has a normal mood and affect. Her behavior is normal. Judgment and thought content normal.       Lab Results  Component Value Date   WBC 6.6 05/10/2012   HGB 9.9* 05/10/2012   HCT 30.1* 05/10/2012   PLT 187.0 05/10/2012   CHOL 185 05/10/2012   TRIG 102.0 05/10/2012   HDL 66.20 05/10/2012   LDLDIRECT 230.5 09/11/2010   ALT 14 05/10/2012   AST 21 05/10/2012   NA 139 05/10/2012   K 4.9 05/10/2012   CL 105 05/10/2012   CREATININE 2.0* 05/10/2012   BUN 37* 05/10/2012   CO2 26 05/10/2012   TSH 1.62 05/10/2012   HGBA1C 7.0* 05/10/2012   MICROALBUR 10.3* 05/10/2012    Assessment & Plan:   see problem list. Medications and labs reviewed today.

## 2012-11-10 NOTE — Assessment & Plan Note (Addendum)
Check a1c now, adjust meds if needed Currently metformin alone +ARB, statin; follows with eye doc regularly Change to actos now due to Cr>1.5  Lab Results  Component Value Date   HGBA1C 7.0* 05/10/2012

## 2012-11-10 NOTE — Assessment & Plan Note (Signed)
Not on drops - reports no optho follow up in >52mo Will arrange eval now, for ?glaucoma and DM survellience

## 2013-03-19 ENCOUNTER — Ambulatory Visit (INDEPENDENT_AMBULATORY_CARE_PROVIDER_SITE_OTHER): Payer: Medicare HMO | Admitting: Internal Medicine

## 2013-03-19 ENCOUNTER — Encounter: Payer: Self-pay | Admitting: Internal Medicine

## 2013-03-19 ENCOUNTER — Other Ambulatory Visit (INDEPENDENT_AMBULATORY_CARE_PROVIDER_SITE_OTHER): Payer: Medicare HMO

## 2013-03-19 VITALS — BP 122/62 | HR 82 | Temp 97.1°F | Ht 65.0 in | Wt 231.0 lb

## 2013-03-19 DIAGNOSIS — E1365 Other specified diabetes mellitus with hyperglycemia: Secondary | ICD-10-CM

## 2013-03-19 DIAGNOSIS — H409 Unspecified glaucoma: Secondary | ICD-10-CM

## 2013-03-19 DIAGNOSIS — I10 Essential (primary) hypertension: Secondary | ICD-10-CM

## 2013-03-19 DIAGNOSIS — E785 Hyperlipidemia, unspecified: Secondary | ICD-10-CM

## 2013-03-19 DIAGNOSIS — G252 Other specified forms of tremor: Secondary | ICD-10-CM

## 2013-03-19 DIAGNOSIS — E1329 Other specified diabetes mellitus with other diabetic kidney complication: Secondary | ICD-10-CM

## 2013-03-19 DIAGNOSIS — IMO0002 Reserved for concepts with insufficient information to code with codable children: Secondary | ICD-10-CM

## 2013-03-19 DIAGNOSIS — N058 Unspecified nephritic syndrome with other morphologic changes: Secondary | ICD-10-CM

## 2013-03-19 DIAGNOSIS — G250 Essential tremor: Secondary | ICD-10-CM | POA: Insufficient documentation

## 2013-03-19 LAB — HEMOGLOBIN A1C: Hgb A1c MFr Bld: 6.7 % — ABNORMAL HIGH (ref 4.6–6.5)

## 2013-03-19 LAB — LIPID PANEL
CHOLESTEROL: 192 mg/dL (ref 0–200)
HDL: 61.3 mg/dL (ref >39.00)
LDL CALC: 117 mg/dL — AB (ref 0–99)
Total CHOL/HDL Ratio: 3
Triglycerides: 71 mg/dL (ref 0.0–149.0)
VLDL: 14.2 mg/dL (ref 0.0–40.0)

## 2013-03-19 LAB — MICROALBUMIN / CREATININE URINE RATIO
Creatinine,U: 124.8 mg/dL
MICROALB/CREAT RATIO: 3 mg/g (ref 0.0–30.0)
Microalb, Ur: 3.8 mg/dL — ABNORMAL HIGH (ref 0.0–1.9)

## 2013-03-19 LAB — BASIC METABOLIC PANEL
BUN: 41 mg/dL — AB (ref 6–23)
CO2: 24 meq/L (ref 19–32)
Calcium: 9.6 mg/dL (ref 8.4–10.5)
Chloride: 111 mEq/L (ref 96–112)
Creatinine, Ser: 2 mg/dL — ABNORMAL HIGH (ref 0.4–1.2)
GFR: 32.35 mL/min — AB (ref >60.00)
GLUCOSE: 95 mg/dL (ref 70–99)
POTASSIUM: 4.7 meq/L (ref 3.5–5.1)
SODIUM: 141 meq/L (ref 135–145)

## 2013-03-19 NOTE — Progress Notes (Signed)
Subjective:    Patient ID: Erin Liu, female    DOB: January 14, 1941, 73 y.o.   MRN: [id number]  HPI  Patient here today for follow-up.  Chronic medical issues reviewed.  DM2 - reports variable compliance with ongoing medical treatment. Metformin changed to Actos 9/014. denies adverse side effects related to current therapy. She does report polyphagia.  Does not check blood sugars at home because of tremors. Has not yet seen ophthalmologist as discussed at last visit.   HTN - reports compliance with prescribed medical treatment. Increase ARB dose 05/2010 -denies adverse side effects related to prescribed therapy. Denies CV symptoms.   dyslipidemia - prev on simva but changed to atorva 09/2010. reports compliance with ongoing medical treatment. denies adverse side effects related to current therapy.  Patient aware of need for heart healthy diet. She is trying to comply  chronic anemia - colo 2008 - WNL - follow up 63yr recommended - no melena or BRBPR - no abdominal pain or dyspepsia   Past Medical History  Diagnosis Date  . ANEMIA, NORMOCYTIC   . BREAST CYST, HX OF   . RENAL FAILURE, CHRONIC   . OBESITY   . HYPERTENSION   . HYPERLIPIDEMIA   . GLUCOMA   . DIABETES MELLITUS, TYPE II   . CARPAL TUNNEL SYNDROME, BILATERAL   . PERIPHERAL NEUROPATHY     Review of Systems  Constitutional: Negative for fever, chills, activity change and appetite change.  HENT: Negative for congestion, rhinorrhea and sinus pressure.   Respiratory: Negative for chest tightness and shortness of breath.   Cardiovascular: Negative for chest pain, palpitations and leg swelling.  Gastrointestinal: Negative for nausea, vomiting, diarrhea, constipation and blood in stool.  Endocrine: Positive for polyphagia. Negative for cold intolerance, heat intolerance, polydipsia and polyuria.  Musculoskeletal:       Occasional left hip pain - worse with palpation.    Neurological: Negative for dizziness, syncope and  headaches.       Objective:   Physical Exam  Vitals reviewed. Constitutional: She is oriented to person, place, and time. She appears well-developed and well-nourished. No distress.  HENT:  Head: Normocephalic and atraumatic.  Eyes: Conjunctivae and EOM are normal. Pupils are equal, round, and reactive to light.  Neck: Normal range of motion. Neck supple. No thyromegaly present.  Cardiovascular: Normal rate, regular rhythm and normal heart sounds.   No murmur heard. Pulmonary/Chest: Effort normal and breath sounds normal. No respiratory distress. She has no wheezes.  Abdominal: Soft. Bowel sounds are normal. She exhibits no distension.  Musculoskeletal: Normal range of motion. She exhibits no edema and no tenderness.  Neurological: She is alert and oriented to person, place, and time.  Resting bilateral hand tremor - worse with purposeful motion.  R>L  Skin: Skin is warm and dry. No rash noted. She is not diaphoretic.  Psychiatric: She has a normal mood and affect. Her behavior is normal. Judgment and thought content normal.   Wt Readings from Last 3 Encounters:  03/19/13 231 lb (104.781 kg)  11/10/12 223 lb 12.8 oz (101.515 kg)  05/10/12 213 lb 9.6 oz (96.888 kg)   Lab Results  Component Value Date   WBC 6.6 05/10/2012   HGB 9.9* 05/10/2012   HCT 30.1* 05/10/2012   PLT 187.0 05/10/2012   GLUCOSE 140* 11/10/2012   CHOL 185 05/10/2012   TRIG 102.0 05/10/2012   HDL 66.20 05/10/2012   LDLDIRECT 230.5 09/11/2010   LDLCALC 98 05/10/2012   ALT 14 05/10/2012  AST 21 05/10/2012   NA 140 11/10/2012   K 4.6 11/10/2012   CL 107 11/10/2012   CREATININE 1.8* 11/10/2012   BUN 35* 11/10/2012   CO2 26 11/10/2012   TSH 1.62 05/10/2012   HGBA1C 7.2* 11/10/2012   MICROALBUR 10.3* 05/10/2012       Assessment & Plan:   See problem list. Medications and labs reviewed today.

## 2013-03-19 NOTE — Assessment & Plan Note (Signed)
Not on drops - reports no optho follow up in >67mo Did not go to office visit when arranged 11/2012 - patient aware of importance of follow-up,  Will re-refer now, for ?glaucoma and DM survellience

## 2013-03-19 NOTE — Progress Notes (Signed)
Pre-visit discussion using our clinic review tool. No additional management support is needed unless otherwise documented below in the visit note.  

## 2013-03-19 NOTE — Assessment & Plan Note (Signed)
Chronic symptoms, right greater than left hand Intention greater than resting -denies progressive disease or complications related to same Continue beta blocker dose atenolol as for hypertension Consider alternate medication or referral to neuro if affecting activity of daily living or progressive disease

## 2013-03-19 NOTE — Assessment & Plan Note (Addendum)
Increased ARB dose in 05/2010 - improved, patient reports improved compliance  BP Readings from Last 3 Encounters:  03/19/13 122/62  11/10/12 140/68  05/10/12 132/60   The current medical regimen appears generally effective;  continue present plan and medications.

## 2013-03-19 NOTE — Patient Instructions (Addendum)
It was good to see you today.  We have reviewed your prior records including labs and tests today  Test(s) ordered today. Your results will be released to [place] (or called to you) after review, usually within 72hours after test completion. If any changes need to be made, you will be notified at that same time.  Medications reviewed and updated, no changes recommended at this time.  we'll make referral for eye check. Our office will contact you regarding appointment(s) once made.  Please schedule followup in 4-6 months for blood pressure and diabetes mellitus check, call sooner if problems.     Diabetes and Standards of Medical Care  Diabetes is complicated. You may find that your diabetes team includes a dietitian, nurse, diabetes educator, eye doctor, and more. To help everyone know what is going on and to help you get the care you deserve, the following schedule of care was developed to help keep you on track. Below are the tests, exams, vaccines, medicines, education, and plans you will need. HbA1c test This test shows how well you have controlled your glucose over the past 2 3 months. It is used to see if your diabetes management plan needs to be adjusted.   It is performed at least 2 times a year if you are meeting treatment goals.  It is performed 4 times a year if therapy has changed or if you are not meeting treatment goals. Blood pressure test  This test is performed at every routine medical visit. The goal is less than 140/90 mmHg for most people, but 130/80 mmHg in some cases. Ask your health care provider about your goal. Dental exam  Follow up with the dentist regularly. Eye exam  If you are diagnosed with type 1 diabetes as a child, get an exam upon reaching the age of 35 years or older and have had diabetes for 3 5 years. Yearly eye exams are recommended after that initial eye exam.  If you are diagnosed with type 1 diabetes as an adult, get an exam within 5 years of  diagnosis and then yearly.  If you are diagnosed with type 2 diabetes, get an exam as soon as possible after the diagnosis and then yearly. Foot care exam  Visual foot exams are performed at every routine medical visit. The exams check for cuts, injuries, or other problems with the feet.  A comprehensive foot exam should be done yearly. This includes visual inspection as well as assessing foot pulses and testing for loss of sensation.  Check your feet nightly for cuts, injuries, or other problems with your feet. Tell your health care provider if anything is not healing. Kidney function test (urine microalbumin)  This test is performed once a year.  Type 1 diabetes: The first test is performed 5 years after diagnosis.  Type 2 diabetes: The first test is performed at the time of diagnosis.  A serum creatinine and estimated glomerular filtration rate (eGFR) test is done once a year to assess the level of chronic kidney disease (CKD), if present. Lipid profile (cholesterol, HDL, LDL, triglycerides)  Performed every 5 years for most people.  The goal for LDL is less than 100 mg/dL. If you are at high risk, the goal is less than 70 mg/dL.  The goal for HDL is 40 mg/dL 50 mg/dL for men and 50 mg/dL 60 mg/dL for women. An HDL cholesterol of 60 mg/dL or higher gives some protection against heart disease.  The goal for triglycerides is less  than 150 mg/dL. Influenza vaccine, pneumococcal vaccine, and hepatitis B vaccine  The influenza vaccine is recommended yearly.  The pneumococcal vaccine is generally given once in a lifetime. However, there are some instances when another vaccination is recommended. Check with your health care provider.  The hepatitis B vaccine is also recommended for adults with diabetes. Diabetes self-management education  Education is recommended at diagnosis and ongoing as needed. Treatment plan  Your treatment plan is reviewed at every medical visit. Document  Released: 12/20/2008 Document Revised: 10/25/2012 Document Reviewed: 07/25/2012 [facility] Patient Information 2014 [place].

## 2013-03-19 NOTE — Assessment & Plan Note (Addendum)
Metformin changed to Actos 11/2012 due to increased creatinine. Pt reports compliance with medications and no adverse reactions Infrequent checks home CBGs because of tremor Will recheck A1C today.   Lab Results  Component Value Date   HGBA1C 7.2* 11/10/2012

## 2013-03-19 NOTE — Assessment & Plan Note (Signed)
On stain: changed from simva to atorva 09/2010  Pt reports variable compliance check annually and adjust prn Educated on importance of med, diet compliance

## 2013-04-23 ENCOUNTER — Ambulatory Visit (INDEPENDENT_AMBULATORY_CARE_PROVIDER_SITE_OTHER): Payer: Self-pay | Admitting: Ophthalmology

## 2013-05-02 ENCOUNTER — Other Ambulatory Visit: Payer: Self-pay | Admitting: Internal Medicine

## 2013-05-02 DIAGNOSIS — Z1231 Encounter for screening mammogram for malignant neoplasm of breast: Secondary | ICD-10-CM

## 2013-05-15 ENCOUNTER — Other Ambulatory Visit: Payer: Self-pay | Admitting: Internal Medicine

## 2013-05-28 ENCOUNTER — Other Ambulatory Visit: Payer: Self-pay | Admitting: Internal Medicine

## 2013-06-01 ENCOUNTER — Ambulatory Visit (HOSPITAL_COMMUNITY): Payer: Self-pay

## 2013-06-13 ENCOUNTER — Emergency Department (HOSPITAL_COMMUNITY)
Admission: EM | Admit: 2013-06-13 | Discharge: 2013-06-13 | Disposition: A | Discharge status: Home / Self Care | Payer: Medicare HMO | Source: Home / Self Care | Attending: Emergency Medicine | Admitting: Emergency Medicine

## 2013-06-13 ENCOUNTER — Encounter (HOSPITAL_COMMUNITY): Payer: Self-pay | Admitting: Emergency Medicine

## 2013-06-13 DIAGNOSIS — M129 Arthropathy, unspecified: Secondary | ICD-10-CM

## 2013-06-13 MED ORDER — HYDROCODONE-ACETAMINOPHEN 5-325 MG PO TABS: 1.0000 | ORAL_TABLET | Freq: Four times a day (QID) | ORAL | Status: DC | PRN

## 2013-06-13 MED ORDER — COLCHICINE 0.6 MG PO TABS
ORAL_TABLET | ORAL | Status: DC
Start: 2013-06-13 — End: 2013-10-15

## 2013-06-13 NOTE — ED Notes (Addendum)
Pt  Reports  Pain l  Leg  With  Swelling  Present -  The  Pt  denys  Any  Injury  She  Is  Sitting  Upright  In  W/c     denys  Any  Chest  Pain  Or  Any  Shortness  Of  Breath

## 2013-06-13 NOTE — ED Provider Notes (Signed)
CSN: [id number]     Arrival date & time 06/13/13  1122 History   First MD Initiated Contact with Patient 06/13/13 1314     Chief Complaint  Patient presents with  . Leg Pain   (Consider location/radiation/quality/duration/timing/severity/associated sxs/prior Treatment) HPI Comments: No injury. No previous surgery. No fevers. No known hx of gout. Limited improvement with Advil. No skin changes.  Patient is a 73 y.o. female presenting with lower extremity pain. The history is provided by the patient.  Foot Pain This is a new problem. Episode onset: 1 week ago. The problem occurs constantly. The problem has not changed since onset.   Past Medical History  Diagnosis Date  . ANEMIA, NORMOCYTIC   . BREAST CYST, HX OF   . RENAL FAILURE, CHRONIC   . OBESITY   . HYPERTENSION   . HYPERLIPIDEMIA   . GLUCOMA   . DIABETES MELLITUS, TYPE II   . CARPAL TUNNEL SYNDROME, BILATERAL   . PERIPHERAL NEUROPATHY    Past Surgical History  Procedure Laterality Date  . Cataract extraction bilateral w/ anterior vitrectomy  05/2010   Family History  Problem Relation Age of Onset  . Arthritis Other   . Alcohol abuse Other   . Diabetes Other     parent, grandparent  . Hypertension Other     parent, grandparent and other   History  Substance Use Topics  . Smoking status: Never Smoker   . Smokeless tobacco: Not on file     Comment: Widowed, lives with niece and neice spouse. Retired Medical illustrator  . Alcohol Use: No   OB History   Grav Para Term Preterm Abortions TAB SAB Ect Mult Living                 Review of Systems  All other systems reviewed and are negative.   Allergies  Lisinopril  Home Medications   Current Outpatient Rx  Name  Route  Sig  Dispense  Refill  . amLODipine (NORVASC) 10 MG tablet      take 1 tablet by mouth once daily   30 tablet   11   . atenolol (TENORMIN) 50 MG tablet      take 1 tablet by mouth once daily   30 tablet   11   . atorvastatin  (LIPITOR) 40 MG tablet      take 1 tablet by mouth once daily   30 tablet   5   . colchicine 0.6 MG tablet      Take 2 tablets at once then 1 tablet one hour after initial dose   3 tablet   0   . HYDROcodone-acetaminophen (NORCO/VICODIN) 5-325 MG per tablet   Oral   Take 1 tablet by mouth every 6 (six) hours as needed for moderate pain or severe pain.   6 tablet   0   . pioglitazone (ACTOS) 30 MG tablet   Oral   Take 1 tablet (30 mg total) by mouth daily.   30 tablet   5   . valsartan (DIOVAN) 160 MG tablet      take 1 tablet by mouth once daily   30 tablet   11    BP 158/67  Pulse 79  Temp(Src) 97.7 F (36.5 C) (Oral)  Resp 18  SpO2 100% Physical Exam  Nursing note and vitals reviewed. Constitutional: She is oriented to person, place, and time. She appears well-developed and well-nourished.  HENT:  Head: Normocephalic and atraumatic.  Eyes: Conjunctivae are normal.  Cardiovascular: Normal rate, regular rhythm and normal heart sounds.   Pulmonary/Chest: Effort normal and breath sounds normal. No respiratory distress. She has no wheezes.  Abdominal: Soft. Bowel sounds are normal. She exhibits no distension. There is no tenderness.  Neurological: She is alert and oriented to person, place, and time.  Skin: Skin is warm and dry. No rash noted. There is erythema.  Mild erythema, tenderness with podagra at base of left great toe. Mild erythema, tenderness with associated mild STS at left medial ankle.  Pulses, sensation and ROM intact.   Psychiatric: She has a normal mood and affect. Her behavior is normal.    ED Course  Procedures (including critical care time) Labs Review Labs Reviewed - No data to display Imaging Review No results found.   MDM   1. Arthropathy    Exam suggestive of gouty arthralgia. Patient is diabetic, therefore, will not use prednisone for fear of creating hyperglycemia. Old record reviewed and it would appear patient also has chronic  stable renal insufficiency, therefore will not use NSAIDs for fear of additional compromise of renal function. Will treat with colchicine as prescribed and vicodin for pain. Advised increasing hydration and following up with Dr. Asa Lente tomorrow for re-check.     [place], [place] 06/13/13 [phone]

## 2013-06-14 ENCOUNTER — Ambulatory Visit: Payer: Self-pay | Admitting: Internal Medicine

## 2013-06-14 DIAGNOSIS — Z0289 Encounter for other administrative examinations: Secondary | ICD-10-CM

## 2013-06-14 NOTE — ED Provider Notes (Signed)
Medical screening examination/treatment/procedure(s) were performed by non-physician practitioner and as supervising physician I was immediately available for consultation/collaboration.  Philipp Deputy, M.D.  Harden Mo, MD 06/14/13 [phone]

## 2013-06-18 ENCOUNTER — Ambulatory Visit (HOSPITAL_COMMUNITY): Payer: Self-pay

## 2013-07-13 ENCOUNTER — Other Ambulatory Visit: Payer: Self-pay | Admitting: Internal Medicine

## 2013-07-15 ENCOUNTER — Other Ambulatory Visit: Payer: Self-pay | Admitting: Internal Medicine

## 2013-09-01 ENCOUNTER — Other Ambulatory Visit: Payer: Self-pay | Admitting: Internal Medicine

## 2013-09-12 ENCOUNTER — Telehealth: Payer: Self-pay | Admitting: *Deleted

## 2013-09-12 DIAGNOSIS — E1365 Other specified diabetes mellitus with hyperglycemia: Principal | ICD-10-CM

## 2013-09-12 DIAGNOSIS — IMO0002 Reserved for concepts with insufficient information to code with codable children: Secondary | ICD-10-CM

## 2013-09-12 DIAGNOSIS — E1329 Other specified diabetes mellitus with other diabetic kidney complication: Secondary | ICD-10-CM

## 2013-09-12 NOTE — Telephone Encounter (Signed)
Patient has an appointment A1c, bmet ordered Diabetic bundle 

## 2013-10-15 ENCOUNTER — Ambulatory Visit (INDEPENDENT_AMBULATORY_CARE_PROVIDER_SITE_OTHER): Payer: Commercial Managed Care - HMO | Admitting: Internal Medicine

## 2013-10-15 ENCOUNTER — Other Ambulatory Visit (INDEPENDENT_AMBULATORY_CARE_PROVIDER_SITE_OTHER): Payer: Commercial Managed Care - HMO

## 2013-10-15 ENCOUNTER — Encounter: Payer: Self-pay | Admitting: Internal Medicine

## 2013-10-15 VITALS — BP 140/76 | HR 82 | Temp 97.6°F | Resp 18 | Ht 65.5 in | Wt 231.0 lb

## 2013-10-15 DIAGNOSIS — D649 Anemia, unspecified: Secondary | ICD-10-CM

## 2013-10-15 DIAGNOSIS — Z0000 Encounter for general adult medical examination without abnormal findings: Secondary | ICD-10-CM

## 2013-10-15 DIAGNOSIS — E1365 Other specified diabetes mellitus with hyperglycemia: Principal | ICD-10-CM

## 2013-10-15 DIAGNOSIS — IMO0002 Reserved for concepts with insufficient information to code with codable children: Secondary | ICD-10-CM

## 2013-10-15 DIAGNOSIS — E1329 Other specified diabetes mellitus with other diabetic kidney complication: Secondary | ICD-10-CM

## 2013-10-15 DIAGNOSIS — Z1239 Encounter for other screening for malignant neoplasm of breast: Secondary | ICD-10-CM

## 2013-10-15 DIAGNOSIS — E669 Obesity, unspecified: Secondary | ICD-10-CM

## 2013-10-15 LAB — CBC WITH DIFFERENTIAL/PLATELET
BASOS ABS: 0 10*3/uL (ref 0.0–0.1)
BASOS PCT: 0.2 % (ref 0.0–3.0)
Eosinophils Absolute: 0.2 10*3/uL (ref 0.0–0.7)
Eosinophils Relative: 3.9 % (ref 0.0–5.0)
HCT: 30.2 % — ABNORMAL LOW (ref 36.0–46.0)
HEMOGLOBIN: 9.8 g/dL — AB (ref 12.0–15.0)
Lymphocytes Relative: 30 % (ref 12.0–46.0)
Lymphs Abs: 1.6 10*3/uL (ref 0.7–4.0)
MCHC: 32.5 g/dL (ref 30.0–36.0)
MCV: 88.3 fl (ref 78.0–100.0)
Monocytes Absolute: 0.6 10*3/uL (ref 0.1–1.0)
Monocytes Relative: 10.5 % (ref 3.0–12.0)
Neutro Abs: 3 10*3/uL (ref 1.4–7.7)
Neutrophils Relative %: 55.4 % (ref 43.0–77.0)
Platelets: 149 10*3/uL — ABNORMAL LOW (ref 150.0–400.0)
RBC: 3.42 Mil/uL — AB (ref 3.87–5.11)
RDW: 14.9 % (ref 11.5–15.5)
WBC: 5.4 10*3/uL (ref 4.0–10.5)

## 2013-10-15 LAB — BASIC METABOLIC PANEL
BUN: 43 mg/dL — ABNORMAL HIGH (ref 6–23)
CALCIUM: 9.9 mg/dL (ref 8.4–10.5)
CO2: 27 mEq/L (ref 19–32)
Chloride: 109 mEq/L (ref 96–112)
Creatinine, Ser: 2 mg/dL — ABNORMAL HIGH (ref 0.4–1.2)
GFR: 31.18 mL/min — AB (ref >60.00)
Glucose, Bld: 99 mg/dL (ref 70–99)
Potassium: 4.9 mEq/L (ref 3.5–5.1)
SODIUM: 140 meq/L (ref 135–145)

## 2013-10-15 LAB — MICROALBUMIN / CREATININE URINE RATIO
CREATININE, U: 125.7 mg/dL
Microalb Creat Ratio: 11.9 mg/g (ref 0.0–30.0)
Microalb, Ur: 14.9 mg/dL — ABNORMAL HIGH (ref 0.0–1.9)

## 2013-10-15 LAB — LIPID PANEL
CHOLESTEROL: 219 mg/dL — AB (ref 0–200)
HDL: 68.4 mg/dL (ref >39.00)
LDL Cholesterol: 140 mg/dL — ABNORMAL HIGH (ref 0–99)
NonHDL: 150.6
TRIGLYCERIDES: 54 mg/dL (ref 0.0–149.0)
Total CHOL/HDL Ratio: 3
VLDL: 10.8 mg/dL (ref 0.0–40.0)

## 2013-10-15 LAB — FERRITIN: Ferritin: 85.3 ng/mL (ref 10.0–291.0)

## 2013-10-15 LAB — HEMOGLOBIN A1C: HEMOGLOBIN A1C: 6.4 % (ref 4.6–6.5)

## 2013-10-15 MED ORDER — NAPROXEN SODIUM 220 MG PO TABS
220.0000 mg | ORAL_TABLET | Freq: Two times a day (BID) | ORAL | Status: DC
Start: 2013-10-15 — End: 2014-05-28

## 2013-10-15 NOTE — Assessment & Plan Note (Signed)
Metformin changed to Actos 11/2012 due to increased creatinine. Pt reports compliance with medications and no adverse reactions Infrequent checks home CBGs because of tremor Will recheck A1C today.  On statin, ARB and ASA 81 qd  Lab Results  Component Value Date   HGBA1C 6.7* 03/19/2013

## 2013-10-15 NOTE — Progress Notes (Signed)
Subjective:    Patient ID: Erin Liu, female    DOB: 1940-04-03, 73 y.o.   MRN: [id number]  HPI  Here for medicare wellness  Diet: diabetic Physical activity: sedentary Depression/mood screen: negative Hearing: intact to whispered voice Visual acuity: grossly normal, performs annual eye exam  ADLs: capable Fall risk: none Home safety: good Cognitive evaluation: intact to orientation, naming, recall and repetition EOL planning: adv directives, full code/ I agree  I have personally reviewed and have noted 1. The patient's medical and social history 2. Their use of alcohol, tobacco or illicit drugs 3. Their current medications and supplements 4. The patient's functional ability including ADL's, fall risks, home safety risks and hearing or visual impairment. 5. Diet and physical activities 6. Evidence for depression or mood disorders  Also reviewed chronic medical issues and interval medical events  Past Medical History  Diagnosis Date  . ANEMIA, NORMOCYTIC   . BREAST CYST, HX OF   . RENAL FAILURE, CHRONIC   . OBESITY   . HYPERTENSION   . HYPERLIPIDEMIA   . GLUCOMA   . DIABETES MELLITUS, TYPE II   . CARPAL TUNNEL SYNDROME, BILATERAL   . PERIPHERAL NEUROPATHY    Family History  Problem Relation Age of Onset  . Arthritis Other   . Alcohol abuse Other   . Diabetes Other     parent, grandparent  . Hypertension Other     parent, grandparent and other   History  Substance Use Topics  . Smoking status: Never Smoker   . Smokeless tobacco: Not on file  . Alcohol Use: No    Review of Systems  Constitutional: Negative for fever, fatigue and unexpected weight change.  Respiratory: Negative for cough, shortness of breath and wheezing.   Cardiovascular: Negative for chest pain, palpitations and leg swelling.  Gastrointestinal: Negative for nausea, abdominal pain and diarrhea.  Genitourinary: Negative for dysuria, hematuria, difficulty urinating and pelvic pain.    Musculoskeletal: Positive for back pain (mild x 3 days, on L side, improved with OTC aleve).  Neurological: Negative for dizziness, weakness, light-headedness and headaches.  Psychiatric/Behavioral: Negative for dysphoric mood. The patient is not nervous/anxious.   All other systems reviewed and are negative.      Objective:   Physical Exam  BP 140/76  Pulse 82  Temp(Src) 97.6 F (36.4 C) (Oral)  Resp 18  Ht 5' 5.5" (1.664 m)  Wt 231 lb (104.781 kg)  BMI 37.84 kg/m2  SpO2 96% Wt Readings from Last 3 Encounters:  10/15/13 231 lb (104.781 kg)  03/19/13 231 lb (104.781 kg)  11/10/12 223 lb 12.8 oz (101.515 kg)   Constitutional: She is obese, and appears well-developed and well-nourished. No distress.  Neck: Normal range of motion. Neck supple. No JVD present. No thyromegaly present.  Cardiovascular: Normal rate, regular rhythm and normal heart sounds.  No murmur heard. No BLE edema. Pulmonary/Chest: Effort normal and breath sounds normal. No respiratory distress. She has no wheezes.  MSkel: Back: full range of motion of thoracic and lumbar spine. Non tender to palpation. Negative straight leg raise. DTR's are symmetrically intact. Sensation intact in all dermatomes of the lower extremities. Full strength to manual muscle testing. patient is able to heel toe walk without difficulty and ambulates with minimally antalgic gait. Neuro: fine resting tremor, chronic, B hands R>L Psychiatric: She has a normal mood and affect. Her behavior is normal. Judgment and thought content normal.   Lab Results  Component Value Date   WBC 6.6  05/10/2012   HGB 9.9* 05/10/2012   HCT 30.1* 05/10/2012   PLT 187.0 05/10/2012   GLUCOSE 95 03/19/2013   CHOL 192 03/19/2013   TRIG 71.0 03/19/2013   HDL 61.30 03/19/2013   LDLDIRECT 230.5 09/11/2010   LDLCALC 117* 03/19/2013   ALT 14 05/10/2012   AST 21 05/10/2012   NA 141 03/19/2013   K 4.7 03/19/2013   CL 111 03/19/2013   CREATININE 2.0* 03/19/2013   BUN 41* 03/19/2013    CO2 24 03/19/2013   TSH 1.62 05/10/2012   HGBA1C 6.7* 03/19/2013   MICROALBUR 3.8* 03/19/2013    No results found.     Assessment & Plan:   AWV/v70.0 - Today patient counseled on age appropriate routine health concerns for screening and prevention, each reviewed and up to date or declined. Immunizations reviewed and up to date or declined. Labs ordered and reviewed. Risk factors for depression reviewed and negative. Hearing function and visual acuity are intact. ADLs screened and addressed as needed. Functional ability and level of safety reviewed and appropriate. Education, counseling and referrals performed based on assessed risks today. Patient provided with a copy of personalized plan for preventive services.  Problem List Items Addressed This Visit   ANEMIA, NORMOCYTIC     Colo 12/15/05 WNL - recommended 37yr follow up Recheck now with iron/ferritin     Relevant Orders      CBC with Differential      Ferritin   DM (diabetes mellitus), secondary, uncontrolled, with renal complications      Metformin changed to Actos 11/2012 due to increased creatinine. Pt reports compliance with medications and no adverse reactions Infrequent checks home CBGs because of tremor Will recheck A1C today.  On statin, ARB and ASA 81 qd  Lab Results  Component Value Date   HGBA1C 6.7* 03/19/2013      Relevant Orders      Hemoglobin A1c      Basic metabolic panel      Lipid panel      Microalbumin / creatinine urine ratio      Ambulatory referral to Ophthalmology      Ambulatory referral to Podiatry   OBESITY      Wt Readings from Last 3 Encounters:  10/15/13 231 lb (104.781 kg)  03/19/13 231 lb (104.781 kg)  11/10/12 223 lb 12.8 oz (101.515 kg)   Weight trends reviewed The patient is asked to make an attempt to improve diet and exercise patterns to aid in medical management of this problem.      Other Visit Diagnoses   Routine general medical examination at a health care facility    -   Primary    Other screening breast examination        Relevant Orders       MM DIGITAL SCREENING BILATERAL

## 2013-10-15 NOTE — Assessment & Plan Note (Signed)
Wt Readings from Last 3 Encounters:  10/15/13 231 lb (104.781 kg)  03/19/13 231 lb (104.781 kg)  11/10/12 223 lb 12.8 oz (101.515 kg)   Weight trends reviewed The patient is asked to make an attempt to improve diet and exercise patterns to aid in medical management of this problem.

## 2013-10-15 NOTE — Patient Instructions (Addendum)
It was good to see you today.  We have reviewed your prior records including labs and tests today  Health Maintenance reviewed - refer for mammogram, eye exam and foot exam with specialists - all other recommended immunizations and age-appropriate screenings are up-to-date.  Test(s) ordered today. Your results will be released to [facility] (or called to you) after review, usually within 72hours after test completion. If any changes need to be made, you will be notified at that same time.  Medications reviewed and updated, no changes recommended at this time.  Work on lifestyle changes as discussed (low fat, low carb, increased protein diet; improved exercise efforts; weight loss) to control sugar, blood pressure and cholesterol levels and/or reduce risk of developing other medical problems. Look into [email or web address] or other type of food journal to assist you in this process.  Please schedule followup in 6 months for semiannual diabetes, exam and labs, call sooner if problems.  Health Maintenance Adopting a healthy lifestyle and getting preventive care can go a long way to promote health and wellness. Talk with your health care provider about what schedule of regular examinations is right for you. This is a good chance for you to check in with your provider about disease prevention and staying healthy. In between checkups, there are plenty of things you can do on your own. Experts have done a lot of research about which lifestyle changes and preventive measures are most likely to keep you healthy. Ask your health care provider for more information. WEIGHT AND DIET  Eat a healthy diet  Be sure to include plenty of vegetables, fruits, low-fat dairy products, and lean protein.  Do not eat a lot of foods high in solid fats, added sugars, or salt.  Get regular exercise. This is one of the most important things you can do for your health.  Most adults should exercise for at least 150 minutes each  week. The exercise should increase your heart rate and make you sweat (moderate-intensity exercise).  Most adults should also do strengthening exercises at least twice a week. This is in addition to the moderate-intensity exercise.  Maintain a healthy weight  Body mass index (BMI) is a measurement that can be used to identify possible weight problems. It estimates body fat based on height and weight. Your health care provider can help determine your BMI and help you achieve or maintain a healthy weight.  For females 38 years of age and older:   A BMI below 18.5 is considered underweight.  A BMI of 18.5 to 24.9 is normal.  A BMI of 25 to 29.9 is considered overweight.  A BMI of 30 and above is considered obese.  Watch levels of cholesterol and blood lipids  You should start having your blood tested for lipids and cholesterol at 73 years of age, then have this test every 5 years.  You may need to have your cholesterol levels checked more often if:  Your lipid or cholesterol levels are high.  You are older than 73 years of age.  You are at high risk for heart disease.  CANCER SCREENING   Lung Cancer  Lung cancer screening is recommended for adults 42-10 years old who are at high risk for lung cancer because of a history of smoking.  A yearly low-dose CT scan of the lungs is recommended for people who:  Currently smoke.  Have quit within the past 15 years.  Have at least a 30-pack-year history of smoking. A pack year is  smoking an average of one pack of cigarettes a day for 1 year.  Yearly screening should continue until it has been 15 years since you quit.  Yearly screening should stop if you develop a health problem that would prevent you from having lung cancer treatment.  Breast Cancer  Practice breast self-awareness. This means understanding how your breasts normally appear and feel.  It also means doing regular breast self-exams. Let your health care provider  know about any changes, no matter how small.  If you are in your 20s or 30s, you should have a clinical breast exam (CBE) by a health care provider every 1-3 years as part of a regular health exam.  If you are 18 or older, have a CBE every year. Also consider having a breast X-ray (mammogram) every year.  If you have a family history of breast cancer, talk to your health care provider about genetic screening.  If you are at high risk for breast cancer, talk to your health care provider about having an MRI and a mammogram every year.  Breast cancer gene (BRCA) assessment is recommended for women who have family members with BRCA-related cancers. BRCA-related cancers include:  Breast.  Ovarian.  Tubal.  Peritoneal cancers.  Results of the assessment will determine the need for genetic counseling and BRCA1 and BRCA2 testing. Cervical Cancer Routine pelvic examinations to screen for cervical cancer are no longer recommended for nonpregnant women who are considered low risk for cancer of the pelvic organs (ovaries, uterus, and vagina) and who do not have symptoms. A pelvic examination may be necessary if you have symptoms including those associated with pelvic infections. Ask your health care provider if a screening pelvic exam is right for you.   The Pap test is the screening test for cervical cancer for women who are considered at risk.  If you had a hysterectomy for a problem that was not cancer or a condition that could lead to cancer, then you no longer need Pap tests.  If you are older than 65 years, and you have had normal Pap tests for the past 10 years, you no longer need to have Pap tests.  If you have had past treatment for cervical cancer or a condition that could lead to cancer, you need Pap tests and screening for cancer for at least 20 years after your treatment.  If you no longer get a Pap test, assess your risk factors if they change (such as having a new sexual partner).  This can affect whether you should start being screened again.  Some women have medical problems that increase their chance of getting cervical cancer. If this is the case for you, your health care provider may recommend more frequent screening and Pap tests.  The human papillomavirus (HPV) test is another test that may be used for cervical cancer screening. The HPV test looks for the virus that can cause cell changes in the cervix. The cells collected during the Pap test can be tested for HPV.  The HPV test can be used to screen women 63 years of age and older. Getting tested for HPV can extend the interval between normal Pap tests from three to five years.  An HPV test also should be used to screen women of any age who have unclear Pap test results.  After 73 years of age, women should have HPV testing as often as Pap tests.  Colorectal Cancer  This type of cancer can be detected and often prevented.  Routine colorectal cancer screening usually begins at 73 years of age and continues through 73 years of age.  Your health care provider may recommend screening at an earlier age if you have risk factors for colon cancer.  Your health care provider may also recommend using home test kits to check for hidden blood in the stool.  A small camera at the end of a tube can be used to examine your colon directly (sigmoidoscopy or colonoscopy). This is done to check for the earliest forms of colorectal cancer.  Routine screening usually begins at age 3.  Direct examination of the colon should be repeated every 5-10 years through 74 years of age. However, you may need to be screened more often if early forms of precancerous polyps or small growths are found. Skin Cancer  Check your skin from head to toe regularly.  Tell your health care provider about any new moles or changes in moles, especially if there is a change in a mole's shape or color.  Also tell your health care provider if you have  a mole that is larger than the size of a pencil eraser.  Always use sunscreen. Apply sunscreen liberally and repeatedly throughout the day.  Protect yourself by wearing long sleeves, pants, a wide-brimmed hat, and sunglasses whenever you are outside. HEART DISEASE, DIABETES, AND HIGH BLOOD PRESSURE   Have your blood pressure checked at least every 1-2 years. High blood pressure causes heart disease and increases the risk of stroke.  If you are between 48 years and 39 years old, ask your health care provider if you should take aspirin to prevent strokes.  Have regular diabetes screenings. This involves taking a blood sample to check your fasting blood sugar level.  If you are at a normal weight and have a low risk for diabetes, have this test once every three years after 73 years of age.  If you are overweight and have a high risk for diabetes, consider being tested at a younger age or more often. PREVENTING INFECTION  Hepatitis B  If you have a higher risk for hepatitis B, you should be screened for this virus. You are considered at high risk for hepatitis B if:  You were born in a country where hepatitis B is common. Ask your health care provider which countries are considered high risk.  Your parents were born in a high-risk country, and you have not been immunized against hepatitis B (hepatitis B vaccine).  You have HIV or AIDS.  You use needles to inject street drugs.  You live with someone who has hepatitis B.  You have had sex with someone who has hepatitis B.  You get hemodialysis treatment.  You take certain medicines for conditions, including cancer, organ transplantation, and autoimmune conditions. Hepatitis C  Blood testing is recommended for:  Everyone born from [birth year] through 1965.  Anyone with known risk factors for hepatitis C. Sexually transmitted infections (STIs)  You should be screened for sexually transmitted infections (STIs) including gonorrhea and  chlamydia if:  You are sexually active and are younger than 73 years of age.  You are older than 73 years of age and your health care provider tells you that you are at risk for this type of infection.  Your sexual activity has changed since you were last screened and you are at an increased risk for chlamydia or gonorrhea. Ask your health care provider if you are at risk.  If you do not have HIV, but are  at risk, it may be recommended that you take a prescription medicine daily to prevent HIV infection. This is called pre-exposure prophylaxis (PrEP). You are considered at risk if:  You are sexually active and do not regularly use condoms or know the HIV status of your partner(s).  You take drugs by injection.  You are sexually active with a partner who has HIV. Talk with your health care provider about whether you are at high risk of being infected with HIV. If you choose to begin PrEP, you should first be tested for HIV. You should then be tested every 3 months for as long as you are taking PrEP.  PREGNANCY   If you are premenopausal and you may become pregnant, ask your health care provider about preconception counseling.  If you may become pregnant, take 400 to 800 micrograms (mcg) of folic acid every day.  If you want to prevent pregnancy, talk to your health care provider about birth control (contraception). OSTEOPOROSIS AND MENOPAUSE   Osteoporosis is a disease in which the bones lose minerals and strength with aging. This can result in serious bone fractures. Your risk for osteoporosis can be identified using a bone density scan.  If you are 72 years of age or older, or if you are at risk for osteoporosis and fractures, ask your health care provider if you should be screened.  Ask your health care provider whether you should take a calcium or vitamin D supplement to lower your risk for osteoporosis.  Menopause may have certain physical symptoms and risks.  Hormone replacement  therapy may reduce some of these symptoms and risks. Talk to your health care provider about whether hormone replacement therapy is right for you.  HOME CARE INSTRUCTIONS   Schedule regular health, dental, and eye exams.  Stay current with your immunizations.   Do not use any tobacco products including cigarettes, chewing tobacco, or electronic cigarettes.  If you are pregnant, do not drink alcohol.  If you are breastfeeding, limit how much and how often you drink alcohol.  Limit alcohol intake to no more than 1 drink per day for nonpregnant women. One drink equals 12 ounces of beer, 5 ounces of wine, or 1 ounces of hard liquor.  Do not use street drugs.  Do not share needles.  Ask your health care provider for help if you need support or information about quitting drugs.  Tell your health care provider if you often feel depressed.  Tell your health care provider if you have ever been abused or do not feel safe at home. Document Released: 09/07/2010 Document Revised: 07/09/2013 Document Reviewed: 01/24/2013 [facility] Patient Information 2015 [place], [place]. This information is not intended to replace advice given to you by your health care provider. Make sure you discuss any questions you have with your health care provider. Exercise to Lose Weight Exercise and a healthy diet may help you lose weight. Your doctor may suggest specific exercises. EXERCISE IDEAS AND TIPS  Choose low-cost things you enjoy doing, such as walking, bicycling, or exercising to workout videos.  Take stairs instead of the elevator.  Walk during your lunch break.  Park your car further away from work or school.  Go to a gym or an exercise class.  Start with 5 to 10 minutes of exercise each day. Build up to 30 minutes of exercise 4 to 6 days a week.  Wear shoes with good support and comfortable clothes.  Stretch before and after working out.  Work out  until you breathe harder and your heart beats  faster.  Drink extra water when you exercise.  Do not do so much that you hurt yourself, feel dizzy, or get very short of breath. Exercises that burn about 150 calories:  Running 1  miles in 15 minutes.  Playing volleyball for 45 to 60 minutes.  Washing and waxing a car for 45 to 60 minutes.  Playing touch football for 45 minutes.  Walking 1  miles in 35 minutes.  Pushing a stroller 1  miles in 30 minutes.  Playing basketball for 30 minutes.  Raking leaves for 30 minutes.  Bicycling 5 miles in 30 minutes.  Walking 2 miles in 30 minutes.  Dancing for 30 minutes.  Shoveling snow for 15 minutes.  Swimming laps for 20 minutes.  Walking up stairs for 15 minutes.  Bicycling 4 miles in 15 minutes.  Gardening for 30 to 45 minutes.  Jumping rope for 15 minutes.  Washing windows or floors for 45 to 60 minutes. Document Released: 03/27/2010 Document Revised: 05/17/2011 Document Reviewed: 03/27/2010 [facility] Patient Information 2015 [place], [place]. This information is not intended to replace advice given to you by your health care provider. Make sure you discuss any questions you have with your health care provider. Exercise to Lose Weight Exercise and a healthy diet may help you lose weight. Your doctor may suggest specific exercises. EXERCISE IDEAS AND TIPS  Choose low-cost things you enjoy doing, such as walking, bicycling, or exercising to workout videos.  Take stairs instead of the elevator.  Walk during your lunch break.  Park your car further away from work or school.  Go to a gym or an exercise class.  Start with 5 to 10 minutes of exercise each day. Build up to 30 minutes of exercise 4 to 6 days a week.  Wear shoes with good support and comfortable clothes.  Stretch before and after working out.  Work out until you breathe harder and your heart beats faster.  Drink extra water when you exercise.  Do not do so much that you hurt yourself, feel  dizzy, or get very short of breath. Exercises that burn about 150 calories:  Running 1  miles in 15 minutes.  Playing volleyball for 45 to 60 minutes.  Washing and waxing a car for 45 to 60 minutes.  Playing touch football for 45 minutes.  Walking 1  miles in 35 minutes.  Pushing a stroller 1  miles in 30 minutes.  Playing basketball for 30 minutes.  Raking leaves for 30 minutes.  Bicycling 5 miles in 30 minutes.  Walking 2 miles in 30 minutes.  Dancing for 30 minutes.  Shoveling snow for 15 minutes.  Swimming laps for 20 minutes.  Walking up stairs for 15 minutes.  Bicycling 4 miles in 15 minutes.  Gardening for 30 to 45 minutes.  Jumping rope for 15 minutes.  Washing windows or floors for 45 to 60 minutes. Document Released: 03/27/2010 Document Revised: 05/17/2011 Document Reviewed: 03/27/2010 [facility] Patient Information 2015 [place], [place]. This information is not intended to replace advice given to you by your health care provider. Make sure you discuss any questions you have with your health care provider.

## 2013-10-15 NOTE — Progress Notes (Signed)
Pre visit review using our clinic review tool, if applicable. No additional management support is needed unless otherwise documented below in the visit note. 

## 2013-10-15 NOTE — Assessment & Plan Note (Addendum)
Colo 12/15/05 WNL - recommended 48yr follow up Recheck now with iron/ferritin

## 2013-11-16 ENCOUNTER — Encounter: Payer: Self-pay | Admitting: Podiatrist

## 2013-11-16 ENCOUNTER — Ambulatory Visit (INDEPENDENT_AMBULATORY_CARE_PROVIDER_SITE_OTHER): Payer: Commercial Managed Care - HMO | Admitting: Podiatrist

## 2013-11-16 ENCOUNTER — Ambulatory Visit (INDEPENDENT_AMBULATORY_CARE_PROVIDER_SITE_OTHER): Payer: Commercial Managed Care - HMO

## 2013-11-16 VITALS — BP 169/75 | HR 90 | Resp 17 | Ht 65.0 in | Wt 220.0 lb

## 2013-11-16 DIAGNOSIS — M109 Gout, unspecified: Secondary | ICD-10-CM

## 2013-11-16 DIAGNOSIS — M79609 Pain in unspecified limb: Secondary | ICD-10-CM

## 2013-11-16 DIAGNOSIS — M79673 Pain in unspecified foot: Secondary | ICD-10-CM

## 2013-11-16 NOTE — Patient Instructions (Signed)
Diabetes and Foot Care Diabetes may cause you to have problems because of poor blood supply (circulation) to your feet and legs. This may cause the skin on your feet to become thinner, break easier, and heal more slowly. Your skin may become dry, and the skin may peel and crack. You may also have nerve damage in your legs and feet causing decreased feeling in them. You may not notice minor injuries to your feet that could lead to infections or more serious problems. Taking care of your feet is one of the most important things you can do for yourself.  HOME CARE INSTRUCTIONS  Wear shoes at all times, even in the house. Do not go barefoot. Bare feet are easily injured.  Check your feet daily for blisters, cuts, and redness. If you cannot see the bottom of your feet, use a mirror or ask someone for help.  Wash your feet with warm water (do not use hot water) and mild soap. Then pat your feet and the areas between your toes until they are completely dry. Do not soak your feet as this can dry your skin.  Apply a moisturizing lotion or petroleum jelly (that does not contain alcohol and is unscented) to the skin on your feet and to dry, brittle toenails. Do not apply lotion between your toes.  Trim your toenails straight across. Do not dig under them or around the cuticle. File the edges of your nails with an emery board or nail file.  Do not cut corns or calluses or try to remove them with medicine.  Wear clean socks or stockings every day. Make sure they are not too tight. Do not wear knee-high stockings since they may decrease blood flow to your legs.  Wear shoes that fit properly and have enough cushioning. To break in new shoes, wear them for just a few hours a day. This prevents you from injuring your feet. Always look in your shoes before you put them on to be sure there are no objects inside.  Do not cross your legs. This may decrease the blood flow to your feet.  If you find a minor scrape,  cut, or break in the skin on your feet, keep it and the skin around it clean and dry. These areas may be cleansed with mild soap and water. Do not cleanse the area with peroxide, alcohol, or iodine.  When you remove an adhesive bandage, be sure not to damage the skin around it.  If you have a wound, look at it several times a day to make sure it is healing.  Do not use heating pads or hot water bottles. They may burn your skin. If you have lost feeling in your feet or legs, you may not know it is happening until it is too late.  Make sure your health care provider performs a complete foot exam at least annually or more often if you have foot problems. Report any cuts, sores, or bruises to your health care provider immediately. SEEK MEDICAL CARE IF:   You have an injury that is not healing.  You have cuts or breaks in the skin.  You have an ingrown nail.  You notice redness on your legs or feet.  You feel burning or tingling in your legs or feet.  You have pain or cramps in your legs and feet.  Your legs or feet are numb.  Your feet always feel cold. SEEK IMMEDIATE MEDICAL CARE IF:   There is increasing redness,   swelling, or pain in or around a wound.  There is a red line that goes up your leg.  Pus is coming from a wound.  You develop a fever or as directed by your health care provider.  You notice a bad smell coming from an ulcer or wound. Document Released: 02/20/2000 Document Revised: 10/25/2012 Document Reviewed: 08/01/2012 [facility] Patient Information 2015 [place], [place]. This information is not intended to replace advice given to you by your health care provider. Make sure you discuss any questions you have with your health care provider. Gout Gout is an inflammatory arthritis caused by a buildup of uric acid crystals in the joints. Uric acid is a chemical that is normally present in the blood. When the level of uric acid in the blood is too high it can form crystals  that deposit in your joints and tissues. This causes joint redness, soreness, and swelling (inflammation). Repeat attacks are common. Over time, uric acid crystals can form into masses (tophi) near a joint, destroying bone and causing disfigurement. Gout is treatable and often preventable. CAUSES  The disease begins with elevated levels of uric acid in the blood. Uric acid is produced by your body when it breaks down a naturally found substance called purines. Certain foods you eat, such as meats and fish, contain high amounts of purines. Causes of an elevated uric acid level include:  Being passed down from parent to child (heredity).  Diseases that cause increased uric acid production (such as obesity, psoriasis, and certain cancers).  Excessive alcohol use.  Diet, especially diets rich in meat and seafood.  Medicines, including certain cancer-fighting medicines (chemotherapy), water pills (diuretics), and aspirin.  Chronic kidney disease. The kidneys are no longer able to remove uric acid well.  Problems with metabolism. Conditions strongly associated with gout include:  Obesity.  High blood pressure.  High cholesterol.  Diabetes. Not everyone with elevated uric acid levels gets gout. It is not understood why some people get gout and others do not. Surgery, joint injury, and eating too much of certain foods are some of the factors that can lead to gout attacks. SYMPTOMS   An attack of gout comes on quickly. It causes intense pain with redness, swelling, and warmth in a joint.  Fever can occur.  Often, only one joint is involved. Certain joints are more commonly involved:  Base of the big toe.  Knee.  Ankle.  Wrist.  Finger. Without treatment, an attack usually goes away in a few days to weeks. Between attacks, you usually will not have symptoms, which is different from many other forms of arthritis. DIAGNOSIS  Your caregiver will suspect gout based on your symptoms  and exam. In some cases, tests may be recommended. The tests may include:  Blood tests.  Urine tests.  X-rays.  Joint fluid exam. This exam requires a needle to remove fluid from the joint (arthrocentesis). Using a microscope, gout is confirmed when uric acid crystals are seen in the joint fluid. TREATMENT  There are two phases to gout treatment: treating the sudden onset (acute) attack and preventing attacks (prophylaxis).  Treatment of an Acute Attack.  Medicines are used. These include anti-inflammatory medicines or steroid medicines.  An injection of steroid medicine into the affected joint is sometimes necessary.  The painful joint is rested. Movement can worsen the arthritis.  You may use warm or cold treatments on painful joints, depending which works best for you.  Treatment to Prevent Attacks.  If you suffer from  frequent gout attacks, your caregiver may advise preventive medicine. These medicines are started after the acute attack subsides. These medicines either help your kidneys eliminate uric acid from your body or decrease your uric acid production. You may need to stay on these medicines for a very long time.  The early phase of treatment with preventive medicine can be associated with an increase in acute gout attacks. For this reason, during the first few months of treatment, your caregiver may also advise you to take medicines usually used for acute gout treatment. Be sure you understand your caregiver's directions. Your caregiver may make several adjustments to your medicine dose before these medicines are effective.  Discuss dietary treatment with your caregiver or dietitian. Alcohol and drinks high in sugar and fructose and foods such as meat, poultry, and seafood can increase uric acid levels. Your caregiver or dietitian can advise you on drinks and foods that should be limited. HOME CARE INSTRUCTIONS   Do not take aspirin to relieve pain. This raises uric acid  levels.  Only take over-the-counter or prescription medicines for pain, discomfort, or fever as directed by your caregiver.  Rest the joint as much as possible. When in bed, keep sheets and blankets off painful areas.  Keep the affected joint raised (elevated).  Apply warm or cold treatments to painful joints. Use of warm or cold treatments depends on which works best for you.  Use crutches if the painful joint is in your leg.  Drink enough fluids to keep your urine clear or pale yellow. This helps your body get rid of uric acid. Limit alcohol, sugary drinks, and fructose drinks.  Follow your dietary instructions. Pay careful attention to the amount of protein you eat. Your daily diet should emphasize fruits, vegetables, whole grains, and fat-free or low-fat milk products. Discuss the use of coffee, vitamin C, and cherries with your caregiver or dietitian. These may be helpful in lowering uric acid levels.  Maintain a healthy body weight. SEEK MEDICAL CARE IF:   You develop diarrhea, vomiting, or any side effects from medicines.  You do not feel better in 24 hours, or you are getting worse. SEEK IMMEDIATE MEDICAL CARE IF:   Your joint becomes suddenly more tender, and you have chills or a fever. MAKE SURE YOU:   Understand these instructions.  Will watch your condition.  Will get help right away if you are not doing well or get worse. Document Released: 02/20/2000 Document Revised: 07/09/2013 Document Reviewed: 10/06/2011 [facility] Patient Information 2015 [place], [place]. This information is not intended to replace advice given to you by your health care provider. Make sure you discuss any questions you have with your health care provider.

## 2013-11-16 NOTE — Progress Notes (Signed)
   Subjective:    Patient ID: Erin Liu, female    DOB: Nov 25, 1940, 73 y.o.   MRN: [id number]  HPI Comments: Pt states about one month, her feet began to burn and tingle so badly, she was unable to sleep.  Pt states she went to the ER and they said she may have gout.  Foot Pain Associated symptoms include arthralgias.      Review of Systems  Musculoskeletal: Positive for arthralgias and gait problem.  Neurological: Positive for tremors.  Psychiatric/Behavioral: The patient is nervous/anxious.   All other systems reviewed and are negative.      Objective:   Physical Exam  GENERAL APPEARANCE: Alert, conversant. Appropriately groomed. No acute distress.  VASCULAR: Pedal pulses palpable at 1/4 DP and PT bilateral.  Capillary refill time is immediate to all digits,  Proximal to distal cooling it warm to warm.   NEUROLOGIC: sensation is intact epicritically and protectively to 5.07 monofilament at 5/5 sites bilateral.  Light touch is intact bilateral, vibratory sensation intact bilateral, achilles tendon reflex is intact bilateral.  MUSCULOSKELETAL: Flat foot type is noted. She has acceptable muscle strength, tone and stability bilateral.  Intrinsic muscluature intact bilateral.  No pain at the first metatarsophalangeal joint left no residual pain from her gout flare noted.  DERMATOLOGIC: Patient's digital nails are elongated, thickened, discolored and dystrophic x10. She has xerotic skin bilaterally.  No preulcerative lesions are seen, no interdigital maceration noted.  No open lesions present.  No redness, no swelling, no sign of gout flare noted.    Assessment & Plan:  Diabetes with probable gout flare left foot-resolved  Plan: Discussed treatment options and alternatives discussed if she has another gout flare to please call and let me know we can consider injection therapy or oral medication. At this point I recommended a gout diet and this was dispensed for her. She was also  dispensed instructions for foot care for the diabetic patient. She'll be seen back as needed for followup

## 2013-11-22 ENCOUNTER — Ambulatory Visit (INDEPENDENT_AMBULATORY_CARE_PROVIDER_SITE_OTHER): Payer: Commercial Managed Care - HMO | Admitting: Ophthalmology

## 2013-11-28 ENCOUNTER — Ambulatory Visit (INDEPENDENT_AMBULATORY_CARE_PROVIDER_SITE_OTHER): Payer: Commercial Managed Care - HMO | Admitting: Ophthalmology

## 2013-11-28 DIAGNOSIS — E11359 Type 2 diabetes mellitus with proliferative diabetic retinopathy without macular edema: Secondary | ICD-10-CM

## 2013-11-28 DIAGNOSIS — H43819 Vitreous degeneration, unspecified eye: Secondary | ICD-10-CM

## 2013-11-28 DIAGNOSIS — E1139 Type 2 diabetes mellitus with other diabetic ophthalmic complication: Secondary | ICD-10-CM

## 2013-11-28 DIAGNOSIS — I10 Essential (primary) hypertension: Secondary | ICD-10-CM

## 2013-11-28 DIAGNOSIS — H35039 Hypertensive retinopathy, unspecified eye: Secondary | ICD-10-CM

## 2013-11-28 DIAGNOSIS — E1165 Type 2 diabetes mellitus with hyperglycemia: Secondary | ICD-10-CM

## 2013-12-14 ENCOUNTER — Telehealth: Payer: Self-pay | Admitting: *Deleted

## 2013-12-14 NOTE — Telephone Encounter (Signed)
I called and informed her that she can come by to pick up her medication whenever it is convenient for her.  She stated, "Well I'll be by there tomorrow."  I told her to come in the morning because we will be closed the afternoon.

## 2013-12-14 NOTE — Telephone Encounter (Signed)
I left my medicine out there.  I'm trying to call there to see if I can come and pick it up.

## 2014-01-16 ENCOUNTER — Other Ambulatory Visit: Payer: Self-pay | Admitting: Internal Medicine

## 2014-02-20 ENCOUNTER — Other Ambulatory Visit: Payer: Self-pay

## 2014-02-20 MED ORDER — ACCU-CHEK SOFT TOUCH LANCETS MISC: Status: DC

## 2014-02-20 MED ORDER — ATENOLOL 50 MG PO TABS: 50.0000 mg | ORAL_TABLET | Freq: Every day | ORAL | Status: DC

## 2014-02-20 MED ORDER — VALSARTAN 160 MG PO TABS: 160.0000 mg | ORAL_TABLET | Freq: Every day | ORAL | Status: DC

## 2014-02-20 MED ORDER — PIOGLITAZONE HCL 30 MG PO TABS: 30.0000 mg | ORAL_TABLET | Freq: Every day | ORAL | Status: DC

## 2014-02-20 MED ORDER — ATORVASTATIN CALCIUM 40 MG PO TABS: 40.0000 mg | ORAL_TABLET | Freq: Every day | ORAL | Status: DC

## 2014-02-20 MED ORDER — AMLODIPINE BESYLATE 10 MG PO TABS: 10.0000 mg | ORAL_TABLET | Freq: Every day | ORAL | Status: DC

## 2014-02-20 MED ORDER — ONETOUCH ULTRA SYSTEM W/DEVICE KIT: 1.0000 | PACK | Freq: Once | Status: DC

## 2014-02-20 MED ORDER — GLUCOSE BLOOD VI STRP: ORAL_STRIP | Status: DC

## 2014-03-11 ENCOUNTER — Other Ambulatory Visit: Payer: Self-pay

## 2014-03-11 MED ORDER — PIOGLITAZONE HCL 30 MG PO TABS: 30.0000 mg | ORAL_TABLET | Freq: Every day | ORAL | Status: DC

## 2014-03-11 MED ORDER — ATENOLOL 50 MG PO TABS: 50.0000 mg | ORAL_TABLET | Freq: Every day | ORAL | Status: DC

## 2014-03-11 MED ORDER — VALSARTAN 160 MG PO TABS: 160.0000 mg | ORAL_TABLET | Freq: Every day | ORAL | Status: DC

## 2014-03-11 MED ORDER — AMLODIPINE BESYLATE 10 MG PO TABS: 10.0000 mg | ORAL_TABLET | Freq: Every day | ORAL | Status: DC

## 2014-03-11 MED ORDER — ATORVASTATIN CALCIUM 40 MG PO TABS: 40.0000 mg | ORAL_TABLET | Freq: Every day | ORAL | Status: DC

## 2014-03-11 MED ORDER — ACCU-CHEK FASTCLIX LANCET KIT: PACK | Status: DC

## 2014-03-11 MED ORDER — ACCU-CHEK NANO SMARTVIEW W/DEVICE KIT: PACK | Status: DC

## 2014-03-19 DIAGNOSIS — H4011X2 Primary open-angle glaucoma, moderate stage: Secondary | ICD-10-CM | POA: Diagnosis not present

## 2014-04-05 ENCOUNTER — Ambulatory Visit (INDEPENDENT_AMBULATORY_CARE_PROVIDER_SITE_OTHER): Payer: Commercial Managed Care - HMO | Admitting: Ophthalmology

## 2014-04-17 ENCOUNTER — Ambulatory Visit: Payer: Commercial Managed Care - HMO | Admitting: Internal Medicine

## 2014-05-01 ENCOUNTER — Ambulatory Visit (INDEPENDENT_AMBULATORY_CARE_PROVIDER_SITE_OTHER): Payer: Commercial Managed Care - HMO | Admitting: Ophthalmology

## 2014-05-01 DIAGNOSIS — E11359 Type 2 diabetes mellitus with proliferative diabetic retinopathy without macular edema: Secondary | ICD-10-CM | POA: Diagnosis not present

## 2014-05-01 DIAGNOSIS — E11319 Type 2 diabetes mellitus with unspecified diabetic retinopathy without macular edema: Secondary | ICD-10-CM | POA: Diagnosis not present

## 2014-05-01 DIAGNOSIS — H35033 Hypertensive retinopathy, bilateral: Secondary | ICD-10-CM | POA: Diagnosis not present

## 2014-05-01 DIAGNOSIS — H43813 Vitreous degeneration, bilateral: Secondary | ICD-10-CM | POA: Diagnosis not present

## 2014-05-01 DIAGNOSIS — I10 Essential (primary) hypertension: Secondary | ICD-10-CM | POA: Diagnosis not present

## 2014-05-01 LAB — HM DIABETES EYE EXAM

## 2014-05-02 ENCOUNTER — Encounter: Payer: Self-pay | Admitting: Gastroenterology

## 2014-05-22 ENCOUNTER — Encounter: Payer: Self-pay | Admitting: Internal Medicine

## 2014-05-28 ENCOUNTER — Encounter: Payer: Self-pay | Admitting: Internal Medicine

## 2014-05-28 ENCOUNTER — Ambulatory Visit (INDEPENDENT_AMBULATORY_CARE_PROVIDER_SITE_OTHER): Payer: Commercial Managed Care - HMO | Admitting: Internal Medicine

## 2014-05-28 VITALS — BP 136/82 | HR 88 | Temp 97.5°F | Wt 228.2 lb

## 2014-05-28 DIAGNOSIS — I10 Essential (primary) hypertension: Secondary | ICD-10-CM | POA: Diagnosis not present

## 2014-05-28 DIAGNOSIS — E785 Hyperlipidemia, unspecified: Secondary | ICD-10-CM | POA: Diagnosis not present

## 2014-05-28 DIAGNOSIS — D649 Anemia, unspecified: Secondary | ICD-10-CM

## 2014-05-28 DIAGNOSIS — E1329 Other specified diabetes mellitus with other diabetic kidney complication: Secondary | ICD-10-CM

## 2014-05-28 DIAGNOSIS — N058 Unspecified nephritic syndrome with other morphologic changes: Secondary | ICD-10-CM

## 2014-05-28 DIAGNOSIS — IMO0002 Reserved for concepts with insufficient information to code with codable children: Secondary | ICD-10-CM

## 2014-05-28 DIAGNOSIS — E1365 Other specified diabetes mellitus with hyperglycemia: Principal | ICD-10-CM

## 2014-05-28 DIAGNOSIS — Z1239 Encounter for other screening for malignant neoplasm of breast: Secondary | ICD-10-CM

## 2014-05-28 DIAGNOSIS — H409 Unspecified glaucoma: Secondary | ICD-10-CM

## 2014-05-28 NOTE — Assessment & Plan Note (Signed)
  BP Readings from Last 3 Encounters:  05/28/14 136/82  11/16/13 169/75  10/15/13 140/76   Increased ARB dose in 05/2010 - improved, patient reports improved compliance The current medical regimen appears generally effective;  continue present plan and medications.

## 2014-05-28 NOTE — Assessment & Plan Note (Signed)
Follows regularly with optho for glaucoma and DM survellience The current medical regimen is effective;  continue present plan and medications.

## 2014-05-28 NOTE — Assessment & Plan Note (Signed)
On stain: changed from simva to atorva 09/2010  Pt reports variable compliance check annually and adjust prn Educated on importance of med and diet compliance

## 2014-05-28 NOTE — Assessment & Plan Note (Signed)
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

## 2014-05-28 NOTE — Progress Notes (Signed)
Subjective:    Patient ID: Erin Liu, female    DOB: Jul 07, 1940, 74 y.o.   MRN: [id number]  HPI  Patient here for followup - reviewed chronic conditions and interval events  Past Medical History  Diagnosis Date  . ANEMIA, NORMOCYTIC   . BREAST CYST, HX OF   . RENAL FAILURE, CHRONIC   . OBESITY   . HYPERTENSION   . HYPERLIPIDEMIA   . GLUCOMA   . DIABETES MELLITUS, TYPE II   . CARPAL TUNNEL SYNDROME, BILATERAL   . PERIPHERAL NEUROPATHY     Review of Systems  Constitutional: Negative for fever and fatigue.  Respiratory: Negative for cough and shortness of breath.   Cardiovascular: Negative for chest pain and leg swelling.  Neurological: Positive for tremors (hands, chronic). Negative for weakness.       Objective:    Physical Exam  Constitutional: She appears well-developed and well-nourished. No distress.  Obese  Cardiovascular: Normal rate, regular rhythm and normal heart sounds.   No murmur heard. Pulmonary/Chest: Effort normal and breath sounds normal. No respiratory distress.  Musculoskeletal: She exhibits no edema.  Vitals reviewed.   BP 136/82 mmHg  Pulse 88  Temp(Src) 97.5 F (36.4 C) (Oral)  Wt 228 lb 4 oz (103.534 kg)  SpO2 98% Wt Readings from Last 3 Encounters:  05/28/14 228 lb 4 oz (103.534 kg)  11/16/13 220 lb (99.791 kg)  10/15/13 231 lb (104.781 kg)    Lab Results  Component Value Date   WBC 5.4 10/15/2013   HGB 9.8* 10/15/2013   HCT 30.2* 10/15/2013   PLT 149.0* 10/15/2013   GLUCOSE 99 10/15/2013   CHOL 219* 10/15/2013   TRIG 54.0 10/15/2013   HDL 68.40 10/15/2013   LDLDIRECT 230.5 09/11/2010   LDLCALC 140* 10/15/2013   ALT 14 05/10/2012   AST 21 05/10/2012   NA 140 10/15/2013   K 4.9 10/15/2013   CL 109 10/15/2013   CREATININE 2.0* 10/15/2013   BUN 43* 10/15/2013   CO2 27 10/15/2013   TSH 1.62 05/10/2012   HGBA1C 6.4 10/15/2013   MICROALBUR 14.9* 10/15/2013   Iron/TIBC/Ferritin/ %Sat    Component Value Date/Time   IRON 72 01/08/2010 1003   FERRITIN 85.3 10/15/2013 0911   IRONPCTSAT 20.6 01/08/2010 1003    No results found.     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 Pt reports compliance with medications and no adverse reactions Infrequent checks home CBGs because of tremor Will recheck A1C today.  Refer to renal to monitor associated CKD On statin, ARB and ASA 81 qd  Lab Results  Component Value Date   HGBA1C 6.4 10/15/2013        Relevant Orders   Ambulatory referral to Nephrology   Basic metabolic panel   Lipid panel   Hemoglobin A1c   Microalbumin / creatinine urine ratio   Dyslipidemia    On stain: changed from simva to atorva 09/2010  Pt reports variable compliance check annually and adjust prn Educated on importance of med and diet compliance      Essential hypertension     BP Readings  from Last 3 Encounters:  05/28/14 136/82  11/16/13 169/75  10/15/13 140/76   Increased ARB dose in 05/2010 - improved, patient reports improved compliance The current medical regimen appears generally effective;  continue present plan and medications.        Relevant Orders   Ambulatory referral to Nephrology   Glaucoma    Follows regularly with optho for glaucoma and DM survellience The current medical regimen is effective;  continue present plan and medications.         Relevant Medications   latanoprost (XALATAN) 0.005 % ophthalmic solution    Other Visit Diagnoses    Screening for breast cancer        Relevant Orders    MM DIGITAL SCREENING BILATERAL        Gwendolyn Grant, MD

## 2014-05-28 NOTE — Progress Notes (Signed)
Pre visit review using our clinic review tool, if applicable. No additional management support is needed unless otherwise documented below in the visit note. 

## 2014-05-28 NOTE — Patient Instructions (Addendum)
It was good to see you today.  We have reviewed your prior records including labs and tests today  Test(s) ordered today. Your results will be released to [place] (or called to you) after review, usually within 72hours after test completion. If any changes need to be made, you will be notified at that same time.  Medications reviewed and updated, no changes recommended at this time. Refill on medication(s) as discussed today.  we'll make referral to kidney specialist to help [place] watch your kidney function. Our office will contact you regarding appointment(s) once made.  we'll make referral for mammogram s[place]ing . Our office will contact you regarding appointment(s) once made.  Please schedule followup in 6 months, call sooner if problems.  Diabetes Mellitus and Food It is important for you to manage your blood sugar (glucose) level. Your blood glucose level can be greatly affected by what you eat. Eating healthier foods in the appropriate amounts throughout the day at about the same time each day will help you control your blood glucose level. It can also help slow or prevent worsening of your diabetes mellitus. Healthy eating may even help you improve the level of your blood pressure and reach or maintain a healthy weight.  HOW CAN FOOD AFFECT ME? Carbohydrates Carbohydrates affect your blood glucose level more than any other type of food. Your dietitian will help you determine how many carbohydrates to eat at each meal and teach you how to count carbohydrates. Counting carbohydrates is important to keep your blood glucose at a healthy level, especially if you are using insulin or taking certain medicines for diabetes mellitus. Alcohol Alcohol can cause sudden decreases in blood glucose (hypoglycemia), especially if you use insulin or take certain medicines for diabetes mellitus. Hypoglycemia can be a life-threatening condition. Symptoms of hypoglycemia (sleepiness, dizziness, and  disorientation) are similar to symptoms of having too much alcohol.  If your health care provider has given you approval to drink alcohol, do so in moderation and use the following guidelines:  Women should not have more than one drink per day, and men should not have more than two drinks per day. One drink is equal to:  12 oz of beer.  5 oz of wine.  1 oz of hard liquor.  Do not drink on an empty stomach.  Keep yourself hydrated. Have water, diet soda, or unsweetened iced tea.  Regular soda, juice, and other mixers might contain a lot of carbohydrates and should be counted. WHAT FOODS ARE NOT RECOMMENDED? As you make food choices, it is important to remember that all foods are not the same. Some foods have fewer nutrients per serving than other foods, even though they might have the same number of calories or carbohydrates. It is difficult to get your body what it needs when you eat foods with fewer nutrients. Examples of foods that you should avoid that are high in calories and carbohydrates but low in nutrients include:  Trans fats (most processed foods list trans fats on the Nutrition Facts label).  Regular soda.  Juice.  Candy.  Sweets, such as cake, pie, doughnuts, and cookies.  Fried foods. WHAT FOODS CAN I EAT? Have nutrient-rich foods, which will nourish your body and keep you healthy. The food you should eat also will depend on several factors, including:  The calories you need.  The medicines you take.  Your weight.  Your blood glucose level.  Your blood pressure level.  Your cholesterol level. You also should eat a variety  of foods, including:  Protein, such as meat, poultry, fish, tofu, nuts, and seeds (lean animal proteins are best).  Fruits.  Vegetables.  Dairy products, such as milk, cheese, and yogurt (low fat is best).  Breads, grains, pasta, cereal, rice, and beans.  Fats such as olive oil, trans fat-free margarine, canola oil, avocado, and  olives. DOES EVERYONE WITH DIABETES MELLITUS HAVE THE SAME MEAL PLAN? Because every person with diabetes mellitus is different, there is not one meal plan that works for everyone. It is very important that you meet with a dietitian who will help you create a meal plan that is just right for you. Document Released: 11/19/2004 Document Revised: 02/27/2013 Document Reviewed: 01/19/2013 [facility] Patient Information 2015 [place], [place]. This information is not intended to replace advice given to you by your health care provider. Make sure you discuss any questions you have with your health care provider.

## 2014-05-28 NOTE — Assessment & Plan Note (Signed)
Metformin changed to Actos 11/2012 due to increased creatinine. Pt reports compliance with medications and no adverse reactions Infrequent checks home CBGs because of tremor Will recheck A1C today.  Refer to renal to monitor associated CKD On statin, ARB and ASA 81 qd  Lab Results  Component Value Date   HGBA1C 6.4 10/15/2013

## 2014-05-29 ENCOUNTER — Telehealth: Payer: Self-pay | Admitting: Internal Medicine

## 2014-05-29 NOTE — Telephone Encounter (Signed)
emmi mailed  °

## 2014-06-21 DIAGNOSIS — Z1231 Encounter for screening mammogram for malignant neoplasm of breast: Secondary | ICD-10-CM | POA: Diagnosis not present

## 2014-06-21 LAB — HM MAMMOGRAPHY

## 2014-06-25 ENCOUNTER — Encounter: Payer: Self-pay | Admitting: Internal Medicine

## 2014-07-16 DIAGNOSIS — H4011X2 Primary open-angle glaucoma, moderate stage: Secondary | ICD-10-CM | POA: Diagnosis not present

## 2014-07-16 DIAGNOSIS — Z961 Presence of intraocular lens: Secondary | ICD-10-CM | POA: Diagnosis not present

## 2014-09-25 ENCOUNTER — Telehealth: Payer: Self-pay | Admitting: Internal Medicine

## 2014-09-25 NOTE — Telephone Encounter (Addendum)
Erin Liu need a verbal order for Glucose testing strips and diabetic supply. Didn't get a valid phone number.

## 2014-09-26 NOTE — Telephone Encounter (Signed)
LVM for pt to call back as soon as possible.   RE: Need more information in order to get back with the pharmacy the rx and dm2 supplies the pt needs.

## 2014-10-01 ENCOUNTER — Telehealth: Payer: Self-pay | Admitting: Internal Medicine

## 2014-10-01 NOTE — Telephone Encounter (Signed)
Erin Liu from [facility] stated that patient need verbal request for diabetic test strips. [phone]

## 2014-10-16 DIAGNOSIS — N390 Urinary tract infection, site not specified: Secondary | ICD-10-CM | POA: Diagnosis not present

## 2014-10-16 DIAGNOSIS — N183 Chronic kidney disease, stage 3 (moderate): Secondary | ICD-10-CM | POA: Diagnosis not present

## 2014-10-16 DIAGNOSIS — N2581 Secondary hyperparathyroidism of renal origin: Secondary | ICD-10-CM | POA: Diagnosis not present

## 2014-10-16 DIAGNOSIS — I10 Essential (primary) hypertension: Secondary | ICD-10-CM | POA: Diagnosis not present

## 2014-10-16 DIAGNOSIS — D631 Anemia in chronic kidney disease: Secondary | ICD-10-CM | POA: Diagnosis not present

## 2014-10-16 DIAGNOSIS — E1129 Type 2 diabetes mellitus with other diabetic kidney complication: Secondary | ICD-10-CM | POA: Diagnosis not present

## 2014-10-24 ENCOUNTER — Other Ambulatory Visit: Payer: Self-pay | Admitting: Nephrology

## 2014-10-24 DIAGNOSIS — N183 Chronic kidney disease, stage 3 unspecified: Secondary | ICD-10-CM

## 2014-10-31 ENCOUNTER — Ambulatory Visit (INDEPENDENT_AMBULATORY_CARE_PROVIDER_SITE_OTHER): Payer: Commercial Managed Care - HMO | Admitting: Ophthalmology

## 2014-11-13 ENCOUNTER — Other Ambulatory Visit: Payer: Medicare HMO

## 2014-11-21 ENCOUNTER — Ambulatory Visit (INDEPENDENT_AMBULATORY_CARE_PROVIDER_SITE_OTHER): Payer: Commercial Managed Care - HMO | Admitting: Ophthalmology

## 2014-11-21 ENCOUNTER — Ambulatory Visit
Admission: RE | Admit: 2014-11-21 | Discharge: 2014-11-21 | Disposition: A | Discharge status: Home / Self Care | Payer: Medicare HMO | Source: Ambulatory Visit | Attending: Nephrology | Admitting: Nephrology

## 2014-11-21 DIAGNOSIS — N183 Chronic kidney disease, stage 3 unspecified: Secondary | ICD-10-CM

## 2014-12-02 ENCOUNTER — Ambulatory Visit: Payer: Medicare HMO | Admitting: Internal Medicine

## 2014-12-26 ENCOUNTER — Ambulatory Visit (INDEPENDENT_AMBULATORY_CARE_PROVIDER_SITE_OTHER): Payer: Commercial Managed Care - HMO | Admitting: Ophthalmology

## 2015-01-06 DIAGNOSIS — N2581 Secondary hyperparathyroidism of renal origin: Secondary | ICD-10-CM | POA: Diagnosis not present

## 2015-01-06 DIAGNOSIS — N183 Chronic kidney disease, stage 3 (moderate): Secondary | ICD-10-CM | POA: Diagnosis not present

## 2015-01-08 DIAGNOSIS — I10 Essential (primary) hypertension: Secondary | ICD-10-CM | POA: Diagnosis not present

## 2015-01-08 DIAGNOSIS — N2581 Secondary hyperparathyroidism of renal origin: Secondary | ICD-10-CM | POA: Diagnosis not present

## 2015-01-08 DIAGNOSIS — D631 Anemia in chronic kidney disease: Secondary | ICD-10-CM | POA: Diagnosis not present

## 2015-01-08 DIAGNOSIS — N183 Chronic kidney disease, stage 3 (moderate): Secondary | ICD-10-CM | POA: Diagnosis not present

## 2015-01-08 DIAGNOSIS — R300 Dysuria: Secondary | ICD-10-CM | POA: Diagnosis not present

## 2015-01-13 ENCOUNTER — Ambulatory Visit (INDEPENDENT_AMBULATORY_CARE_PROVIDER_SITE_OTHER): Payer: Commercial Managed Care - HMO | Admitting: Ophthalmology

## 2015-01-13 DIAGNOSIS — H43813 Vitreous degeneration, bilateral: Secondary | ICD-10-CM | POA: Diagnosis not present

## 2015-01-13 DIAGNOSIS — D3131 Benign neoplasm of right choroid: Secondary | ICD-10-CM

## 2015-01-13 DIAGNOSIS — E113513 Type 2 diabetes mellitus with proliferative diabetic retinopathy with macular edema, bilateral: Secondary | ICD-10-CM | POA: Diagnosis not present

## 2015-01-13 DIAGNOSIS — E11311 Type 2 diabetes mellitus with unspecified diabetic retinopathy with macular edema: Secondary | ICD-10-CM

## 2015-01-13 DIAGNOSIS — H35033 Hypertensive retinopathy, bilateral: Secondary | ICD-10-CM

## 2015-01-13 DIAGNOSIS — I10 Essential (primary) hypertension: Secondary | ICD-10-CM | POA: Diagnosis not present

## 2015-01-22 ENCOUNTER — Other Ambulatory Visit (INDEPENDENT_AMBULATORY_CARE_PROVIDER_SITE_OTHER): Payer: Commercial Managed Care - HMO | Admitting: Ophthalmology

## 2015-02-05 DIAGNOSIS — H35033 Hypertensive retinopathy, bilateral: Secondary | ICD-10-CM | POA: Diagnosis not present

## 2015-02-05 DIAGNOSIS — Z961 Presence of intraocular lens: Secondary | ICD-10-CM | POA: Diagnosis not present

## 2015-02-05 DIAGNOSIS — H401132 Primary open-angle glaucoma, bilateral, moderate stage: Secondary | ICD-10-CM | POA: Diagnosis not present

## 2015-02-14 ENCOUNTER — Other Ambulatory Visit (INDEPENDENT_AMBULATORY_CARE_PROVIDER_SITE_OTHER): Payer: Commercial Managed Care - HMO | Admitting: Ophthalmology

## 2015-02-14 DIAGNOSIS — E113512 Type 2 diabetes mellitus with proliferative diabetic retinopathy with macular edema, left eye: Secondary | ICD-10-CM | POA: Diagnosis not present

## 2015-02-14 DIAGNOSIS — E11311 Type 2 diabetes mellitus with unspecified diabetic retinopathy with macular edema: Secondary | ICD-10-CM | POA: Diagnosis not present

## 2015-02-25 ENCOUNTER — Other Ambulatory Visit (INDEPENDENT_AMBULATORY_CARE_PROVIDER_SITE_OTHER): Payer: Commercial Managed Care - HMO

## 2015-02-25 ENCOUNTER — Ambulatory Visit (INDEPENDENT_AMBULATORY_CARE_PROVIDER_SITE_OTHER): Payer: Commercial Managed Care - HMO | Admitting: Internal Medicine

## 2015-02-25 ENCOUNTER — Encounter: Payer: Self-pay | Admitting: Internal Medicine

## 2015-02-25 VITALS — BP 130/60 | HR 74 | Temp 97.4°F | Wt 229.0 lb

## 2015-02-25 DIAGNOSIS — IMO0002 Reserved for concepts with insufficient information to code with codable children: Secondary | ICD-10-CM

## 2015-02-25 DIAGNOSIS — M5440 Lumbago with sciatica, unspecified side: Secondary | ICD-10-CM

## 2015-02-25 DIAGNOSIS — I10 Essential (primary) hypertension: Secondary | ICD-10-CM

## 2015-02-25 DIAGNOSIS — E1329 Other specified diabetes mellitus with other diabetic kidney complication: Secondary | ICD-10-CM | POA: Diagnosis not present

## 2015-02-25 DIAGNOSIS — G8929 Other chronic pain: Secondary | ICD-10-CM

## 2015-02-25 DIAGNOSIS — E0843 Diabetes mellitus due to underlying condition with diabetic autonomic (poly)neuropathy: Secondary | ICD-10-CM

## 2015-02-25 DIAGNOSIS — M545 Low back pain, unspecified: Secondary | ICD-10-CM | POA: Insufficient documentation

## 2015-02-25 DIAGNOSIS — E1365 Other specified diabetes mellitus with hyperglycemia: Secondary | ICD-10-CM | POA: Diagnosis not present

## 2015-02-25 DIAGNOSIS — Z0000 Encounter for general adult medical examination without abnormal findings: Secondary | ICD-10-CM

## 2015-02-25 DIAGNOSIS — E785 Hyperlipidemia, unspecified: Secondary | ICD-10-CM | POA: Diagnosis not present

## 2015-02-25 LAB — MICROALBUMIN / CREATININE URINE RATIO
CREATININE, U: 137.5 mg/dL
MICROALB/CREAT RATIO: 3.9 mg/g (ref 0.0–30.0)
Microalb, Ur: 5.3 mg/dL — ABNORMAL HIGH (ref 0.0–1.9)

## 2015-02-25 LAB — BASIC METABOLIC PANEL
BUN: 51 mg/dL — ABNORMAL HIGH (ref 6–23)
CALCIUM: 10 mg/dL (ref 8.4–10.5)
CHLORIDE: 108 meq/L (ref 96–112)
CO2: 25 meq/L (ref 19–32)
CREATININE: 2.11 mg/dL — AB (ref 0.40–1.20)
GFR: 29.38 mL/min — ABNORMAL LOW (ref >60.00)
Glucose, Bld: 80 mg/dL (ref 70–99)
Potassium: 4.9 mEq/L (ref 3.5–5.1)
SODIUM: 139 meq/L (ref 135–145)

## 2015-02-25 LAB — HEMOGLOBIN A1C: HEMOGLOBIN A1C: 6.4 % (ref 4.6–6.5)

## 2015-02-25 LAB — LIPID PANEL
CHOL/HDL RATIO: 3
Cholesterol: 210 mg/dL — ABNORMAL HIGH (ref 0–200)
HDL: 62.3 mg/dL (ref >39.00)
LDL Cholesterol: 130 mg/dL — ABNORMAL HIGH (ref 0–99)
NonHDL: 147.96
TRIGLYCERIDES: 91 mg/dL (ref 0.0–149.0)
VLDL: 18.2 mg/dL (ref 0.0–40.0)

## 2015-02-25 MED ORDER — TRAMADOL HCL 50 MG PO TABS: 50.0000 mg | ORAL_TABLET | Freq: Three times a day (TID) | ORAL | Status: DC | PRN

## 2015-02-25 MED ORDER — ACETAMINOPHEN 500 MG PO TABS: 500.0000 mg | ORAL_TABLET | Freq: Four times a day (QID) | ORAL | Status: DC | PRN

## 2015-02-25 NOTE — Progress Notes (Signed)
Subjective:    Patient ID: Erin Liu, female    DOB: [birth year], 74 y.o.   MRN: [id number]  HPI  Here for medicare wellness  Diet: heart healthy, diabetic Physical activity: sedentary Depression/mood screen: negative Hearing: intact to whispered voice Visual acuity: grossly normal, performs annual eye exam  ADLs: capable Fall risk: none Home safety: good Cognitive evaluation: intact to orientation, naming, recall and repetition EOL planning: adv directives, full code/ I agree  I have personally reviewed and have noted 1. The patient's medical and social history 2. Their use of alcohol, tobacco or illicit drugs 3. Their current medications and supplements 4. The patient's functional ability including ADL's, fall risks, home safety risks and hearing or visual impairment. 5. Diet and physical activities 6. Evidence for depression or mood disorders  Also reviewed chronic medical conditions, interval events and current concerns   Past Medical History  Diagnosis Date  . ANEMIA, NORMOCYTIC   . BREAST CYST, HX OF   . RENAL FAILURE, CHRONIC   . OBESITY   . HYPERTENSION   . HYPERLIPIDEMIA   . GLUCOMA   . DIABETES MELLITUS, TYPE II   . CARPAL TUNNEL SYNDROME, BILATERAL   . PERIPHERAL NEUROPATHY    Family History  Problem Relation Age of Onset  . Arthritis Other   . Alcohol abuse Other   . Diabetes Other     parent, grandparent  . Hypertension Other     parent, grandparent and other   Social History  Substance Use Topics  . Smoking status: Never Smoker   . Smokeless tobacco: None  . Alcohol Use: No    Review of Systems  Constitutional: Positive for fatigue. Negative for unexpected weight change.  Respiratory: Negative for cough, shortness of breath and wheezing.   Cardiovascular: Negative for chest pain, palpitations and leg swelling.  Gastrointestinal: Negative for nausea, abdominal pain and diarrhea.  Musculoskeletal: Positive for back pain (chronic  "ache" daily for 4-6 mo, worse with activity and better, denies injury or radiation into legs ).  Neurological: Negative for dizziness, weakness, light-headedness and headaches.  Psychiatric/Behavioral: Negative for dysphoric mood. The patient is not nervous/anxious.   All other systems reviewed and are negative.   Patient Care Team: Rowe Clack, MD as PCP - General Hayden Pedro, MD (Ophthalmology) Milus Banister, MD (Gastroenterology) Elmarie Shiley, MD (Nephrology)     Objective:    Physical Exam  Constitutional: She appears well-developed and well-nourished. No distress.  Obese; vocal tremor unchanged  Cardiovascular: Normal rate, regular rhythm and normal heart sounds.   No murmur heard. Pulmonary/Chest: Effort normal and breath sounds normal. No respiratory distress.  Musculoskeletal: She exhibits no edema.  Back: full range of motion of thoracic and lumbar spine. Non tender to palpation. Negative straight leg raise. DTR's are symmetrically intact. Sensation intact in all dermatomes of the lower extremities. Full strength to manual muscle testing. patient is able to heel toe walk without difficulty and ambulates with antalgic gait.  Vitals reviewed.   BP 130/60 mmHg  Pulse 74  Temp(Src) 97.4 F (36.3 C) (Oral)  Wt 229 lb (103.874 kg)  SpO2 98% Wt Readings from Last 3 Encounters:  02/25/15 229 lb (103.874 kg)  05/28/14 228 lb 4 oz (103.534 kg)  11/16/13 220 lb (99.791 kg)    Lab Results  Component Value Date   WBC 5.4 10/15/2013   HGB 9.8* 10/15/2013   HCT 30.2* 10/15/2013   PLT 149.0* 10/15/2013   GLUCOSE 99 10/15/2013  CHOL 219* 10/15/2013   TRIG 54.0 10/15/2013   HDL 68.40 10/15/2013   LDLDIRECT 230.5 09/11/2010   LDLCALC 140* 10/15/2013   ALT 14 05/10/2012   AST 21 05/10/2012   NA 140 10/15/2013   K 4.9 10/15/2013   CL 109 10/15/2013   CREATININE 2.0* 10/15/2013   BUN 43* 10/15/2013   CO2 27 10/15/2013   TSH 1.62 05/10/2012   HGBA1C 6.4  10/15/2013   MICROALBUR 14.9* 10/15/2013    US Renal  11/21/2014  CLINICAL DATA:  Chronic kidney disease, stage III (moderate) N18.3 (ICD-10-CM) Hypertension EXAM: RENAL / URINARY TRACT ULTRASOUND COMPLETE COMPARISON:  None. FINDINGS: Right Kidney: Length: 9.7 cm. Echogenicity within normal limits. No mass or hydronephrosis visualized. Left Kidney: Length: 9.1 cm. Echogenicity within normal limits. No mass or hydronephrosis visualized. Bladder: Appears normal for degree of bladder distention. Incidental note is made of dependent gallstones. IMPRESSION: 1. Normal sonographic appearance of the kidneys.  No hydronephrosis. 2. Cholelithiasis. Electronically Signed   By: Lajean Manes M.D.   On: 11/21/2014 10:08       Assessment & Plan:   [id number] - Today patient counseled on age appropriate routine health concerns for screening and prevention, each reviewed and up to date or declined. Immunizations reviewed and up to date or declined. Labs ordered and reviewed. Risk factors for depression reviewed and negative. Hearing function and visual acuity are intact. ADLs screened and addressed as needed. Functional ability and level of safety reviewed and appropriate. Education, counseling and referrals performed based on assessed risks today. Patient provided with a copy of personalized plan for preventive services.  Problem List Items Addressed This Visit    Diabetic neuropathy ([place])    Advise podiatry follow up for assist with nail care- refer done        DM (diabetes mellitus), secondary, uncontrolled, with renal complications ([place])    Metformin changed to Actos 11/2012 due to increased creatinine. Pt reports compliance with medications and no adverse reactions Infrequent checks home CBGs because of tremor Will recheck A1C, lipids today.  Refer to renal to monitor associated CKD On statin, ARB and ASA 81 qd  Lab Results  Component Value Date   HGBA1C 6.4 10/15/2013        Relevant Orders    Hemoglobin A1c   Lipid panel   Microalbumin / creatinine urine ratio   Basic metabolic panel   Dyslipidemia    On stain: changed from simva to atorva 09/2010  Pt reports variable compliance check annually and adjust prn Educated on importance of med and diet compliance      Essential hypertension     BP Readings from Last 3 Encounters:  02/25/15 130/60  05/28/14 136/82  11/16/13 169/75   Increased ARB dose in 05/2010 - improved, patient reports improved compliance The current medical regimen appears generally effective;  continue present plan and medications.       Lumbago    Ongoing low back pain with radiation into left flank, worse with activity Denies trauma or fall. No radiation into legs - MSkel and neuro exam unremarkable for injury today Advised against use of Aleve because of kidney disease, will instead use Tylenol and tramadol if severe Also okay for medical supply back brace as requested Patient to call if symptoms worse or unimproved in next 6-12 weeks for further evaluation or treatment is needed      Relevant Medications   traMADol (ULTRAM) 50 MG tablet   acetaminophen (TYLENOL) 500 MG tablet  Other Visit Diagnoses    Routine general medical examination at a health care facility    -  Primary        Gwendolyn Grant, MD

## 2015-02-25 NOTE — Assessment & Plan Note (Signed)
Ongoing low back pain with radiation into left flank, worse with activity Denies trauma or fall. No radiation into legs - MSkel and neuro exam unremarkable for injury today Advised against use of Aleve because of kidney disease, will instead use Tylenol and tramadol if severe Also okay for medical supply back brace as requested Patient to call if symptoms worse or unimproved in next 6-12 weeks for further evaluation or treatment is needed

## 2015-02-25 NOTE — Assessment & Plan Note (Signed)
Advise podiatry follow up for assist with nail care- refer done

## 2015-02-25 NOTE — Patient Instructions (Addendum)
It was good to see you today.  We have reviewed your prior records including labs and tests today  Health Maintenance reviewed - all recommended immunizations and age-appropriate screenings are up-to-date.  Test(s) ordered today. Your results will be released to [facility] (or called to you) after review, usually within 72hours after test completion. If any changes need to be made, you will be notified at that same time.  Medications reviewed and updated Use Tylenol 500 mg twice day and as needed for arthritis pain. Also tramadol every 8 hours if needed for severe back pain or knee pain. No other changes recommended at this time.  Your prescription(s) have been submitted to your pharmacy. Please take as directed and contact our office if you believe you are having problem(s) with the medication(s).  Please schedule followup in 4-6 months for semiannual diabetes mellitus exam and labs with your new PCP, please call sooner if problems.  Health Maintenance, Female Adopting a healthy lifestyle and getting preventive care can go a long way to promote health and wellness. Talk with your health care provider about what schedule of regular examinations is right for you. This is a good chance for you to check in with your provider about disease prevention and staying healthy. In between checkups, there are plenty of things you can do on your own. Experts have done a lot of research about which lifestyle changes and preventive measures are most likely to keep you healthy. Ask your health care provider for more information. WEIGHT AND DIET  Eat a healthy diet  Be sure to include plenty of vegetables, fruits, low-fat dairy products, and lean protein.  Do not eat a lot of foods high in solid fats, added sugars, or salt.  Get regular exercise. This is one of the most important things you can do for your health.  Most adults should exercise for at least 150 minutes each week. The exercise should increase  your heart rate and make you sweat (moderate-intensity exercise).  Most adults should also do strengthening exercises at least twice a week. This is in addition to the moderate-intensity exercise.  Maintain a healthy weight  Body mass index (BMI) is a measurement that can be used to identify possible weight problems. It estimates body fat based on height and weight. Your health care provider can help determine your BMI and help you achieve or maintain a healthy weight.  For females 74 years of age and older:   A BMI below 18.5 is considered underweight.  A BMI of 18.5 to 24.9 is normal.  A BMI of 25 to 29.9 is considered overweight.  A BMI of 30 and above is considered obese.  Watch levels of cholesterol and blood lipids  You should start having your blood tested for lipids and cholesterol at 74 years of age, then have this test every 5 years.  You may need to have your cholesterol levels checked more often if:  Your lipid or cholesterol levels are high.  You are older than 74 years of age.  You are at high risk for heart disease.  CANCER SCREENING   Lung Cancer  Lung cancer screening is recommended for adults 74-19 years old who are at high risk for lung cancer because of a history of smoking.  A yearly low-dose CT scan of the lungs is recommended for people who:  Currently smoke.  Have quit within the past 15 years.  Have at least a 30-pack-year history of smoking. A pack year is smoking  an average of one pack of cigarettes a day for 1 year.  Yearly screening should continue until it has been 15 years since you quit.  Yearly screening should stop if you develop a health problem that would prevent you from having lung cancer treatment.  Breast Cancer  Practice breast self-awareness. This means understanding how your breasts normally appear and feel.  It also means doing regular breast self-exams. Let your health care provider know about any changes, no matter  how small.  If you are in your 20s or 30s, you should have a clinical breast exam (CBE) by a health care provider every 1-3 years as part of a regular health exam.  If you are 4 or older, have a CBE every year. Also consider having a breast X-ray (mammogram) every year.  If you have a family history of breast cancer, talk to your health care provider about genetic screening.  If you are at high risk for breast cancer, talk to your health care provider about having an MRI and a mammogram every year.  Breast cancer gene (BRCA) assessment is recommended for women who have family members with BRCA-related cancers. BRCA-related cancers include:  Breast.  Ovarian.  Tubal.  Peritoneal cancers.  Results of the assessment will determine the need for genetic counseling and BRCA1 and BRCA2 testing. Cervical Cancer Your health care provider may recommend that you be screened regularly for cancer of the pelvic organs (ovaries, uterus, and vagina). This screening involves a pelvic examination, including checking for microscopic changes to the surface of your cervix (Pap test). You may be encouraged to have this screening done every 3 years, beginning at age 19.  For women ages 42-65, health care providers may recommend pelvic exams and Pap testing every 3 years, or they may recommend the Pap and pelvic exam, combined with testing for human papilloma virus (HPV), every 5 years. Some types of HPV increase your risk of cervical cancer. Testing for HPV may also be done on women of any age with unclear Pap test results.  Other health care providers may not recommend any screening for nonpregnant women who are considered low risk for pelvic cancer and who do not have symptoms. Ask your health care provider if a screening pelvic exam is right for you.  If you have had past treatment for cervical cancer or a condition that could lead to cancer, you need Pap tests and screening for cancer for at least 20 years  after your treatment. If Pap tests have been discontinued, your risk factors (such as having a new sexual partner) need to be reassessed to determine if screening should resume. Some women have medical problems that increase the chance of getting cervical cancer. In these cases, your health care provider may recommend more frequent screening and Pap tests. Colorectal Cancer  This type of cancer can be detected and often prevented.  Routine colorectal cancer screening usually begins at 74 years of age and continues through 74 years of age.  Your health care provider may recommend screening at an earlier age if you have risk factors for colon cancer.  Your health care provider may also recommend using home test kits to check for hidden blood in the stool.  A small camera at the end of a tube can be used to examine your colon directly (sigmoidoscopy or colonoscopy). This is done to check for the earliest forms of colorectal cancer.  Routine screening usually begins at age 58.  Direct examination of the colon  should be repeated every 5-10 years through 74 years of age. However, you may need to be screened more often if early forms of precancerous polyps or small growths are found. Skin Cancer  Check your skin from head to toe regularly.  Tell your health care provider about any new moles or changes in moles, especially if there is a change in a mole's shape or color.  Also tell your health care provider if you have a mole that is larger than the size of a pencil eraser.  Always use sunscreen. Apply sunscreen liberally and repeatedly throughout the day.  Protect yourself by wearing long sleeves, pants, a wide-brimmed hat, and sunglasses whenever you are outside. HEART DISEASE, DIABETES, AND HIGH BLOOD PRESSURE   High blood pressure causes heart disease and increases the risk of stroke. High blood pressure is more likely to develop in:  People who have blood pressure in the high end of the  normal range (130-139/85-89 mm Hg).  People who are overweight or obese.  People who are African American.  If you are 72-80 years of age, have your blood pressure checked every 3-5 years. If you are 49 years of age or older, have your blood pressure checked every year. You should have your blood pressure measured twice--once when you are at a hospital or clinic, and once when you are not at a hospital or clinic. Record the average of the two measurements. To check your blood pressure when you are not at a hospital or clinic, you can use:  An automated blood pressure machine at a pharmacy.  A home blood pressure monitor.  If you are between 11 years and 11 years old, ask your health care provider if you should take aspirin to prevent strokes.  Have regular diabetes screenings. This involves taking a blood sample to check your fasting blood sugar level.  If you are at a normal weight and have a low risk for diabetes, have this test once every three years after 74 years of age.  If you are overweight and have a high risk for diabetes, consider being tested at a younger age or more often. PREVENTING INFECTION  Hepatitis B  If you have a higher risk for hepatitis B, you should be screened for this virus. You are considered at high risk for hepatitis B if:  You were born in a country where hepatitis B is common. Ask your health care provider which countries are considered high risk.  Your parents were born in a high-risk country, and you have not been immunized against hepatitis B (hepatitis B vaccine).  You have HIV or AIDS.  You use needles to inject street drugs.  You live with someone who has hepatitis B.  You have had sex with someone who has hepatitis B.  You get hemodialysis treatment.  You take certain medicines for conditions, including cancer, organ transplantation, and autoimmune conditions. Hepatitis C  Blood testing is recommended for:  Everyone born from [birth year]  through 1965.  Anyone with known risk factors for hepatitis C. Sexually transmitted infections (STIs)  You should be screened for sexually transmitted infections (STIs) including gonorrhea and chlamydia if:  You are sexually active and are younger than 74 years of age.  You are older than 74 years of age and your health care provider tells you that you are at risk for this type of infection.  Your sexual activity has changed since you were last screened and you are at an increased risk for  chlamydia or gonorrhea. Ask your health care provider if you are at risk.  If you do not have HIV, but are at risk, it may be recommended that you take a prescription medicine daily to prevent HIV infection. This is called pre-exposure prophylaxis (PrEP). You are considered at risk if:  You are sexually active and do not regularly use condoms or know the HIV status of your partner(s).  You take drugs by injection.  You are sexually active with a partner who has HIV. Talk with your health care provider about whether you are at high risk of being infected with HIV. If you choose to begin PrEP, you should first be tested for HIV. You should then be tested every 3 months for as long as you are taking PrEP.  PREGNANCY   If you are premenopausal and you may become pregnant, ask your health care provider about preconception counseling.  If you may become pregnant, take 400 to 800 micrograms (mcg) of folic acid every day.  If you want to prevent pregnancy, talk to your health care provider about birth control (contraception). OSTEOPOROSIS AND MENOPAUSE   Osteoporosis is a disease in which the bones lose minerals and strength with aging. This can result in serious bone fractures. Your risk for osteoporosis can be identified using a bone density scan.  If you are 47 years of age or older, or if you are at risk for osteoporosis and fractures, ask your health care provider if you should be screened.  Ask your  health care provider whether you should take a calcium or vitamin D supplement to lower your risk for osteoporosis.  Menopause may have certain physical symptoms and risks.  Hormone replacement therapy may reduce some of these symptoms and risks. Talk to your health care provider about whether hormone replacement therapy is right for you.  HOME CARE INSTRUCTIONS   Schedule regular health, dental, and eye exams.  Stay current with your immunizations.   Do not use any tobacco products including cigarettes, chewing tobacco, or electronic cigarettes.  If you are pregnant, do not drink alcohol.  If you are breastfeeding, limit how much and how often you drink alcohol.  Limit alcohol intake to no more than 1 drink per day for nonpregnant women. One drink equals 12 ounces of beer, 5 ounces of wine, or 1 ounces of hard liquor.  Do not use street drugs.  Do not share needles.  Ask your health care provider for help if you need support or information about quitting drugs.  Tell your health care provider if you often feel depressed.  Tell your health care provider if you have ever been abused or do not feel safe at home.   This information is not intended to replace advice given to you by your health care provider. Make sure you discuss any questions you have with your health care provider.   Document Released: 09/07/2010 Document Revised: 03/15/2014 Document Reviewed: 01/24/2013 Elsevier Interactive Patient Education 2016 [employer]. Diabetes and Standards of Medical Care Diabetes is complicated. You may find that your diabetes team includes a dietitian, nurse, diabetes educator, eye doctor, and more. To help everyone know what is going on and to help you get the care you deserve, the following schedule of care was developed to help keep you on track. Below are the tests, exams, vaccines, medicines, education, and plans you will need. HbA1c test This test shows how well you have  controlled your glucose over the past 2-3 months. It  is used to see if your diabetes management plan needs to be adjusted.   It is performed at least 2 times a year if you are meeting treatment goals.  It is performed 4 times a year if therapy has changed or if you are not meeting treatment goals. Blood pressure test  This test is performed at every routine medical visit. The goal is less than 140/90 mm Hg for most people, but 130/80 mm Hg in some cases. Ask your health care provider about your goal. Dental exam  Follow up with the dentist regularly. Eye exam  If you are diagnosed with type 1 diabetes as a child, get an exam upon reaching the age of 21 years or older and having had diabetes for 3-5 years. Yearly eye exams are recommended after that initial eye exam.  If you are diagnosed with type 1 diabetes as an adult, get an exam within 5 years of diagnosis and then yearly.  If you are diagnosed with type 2 diabetes, get an exam as soon as possible after the diagnosis and then yearly. Foot care exam  Visual foot exams are performed at every routine medical visit. The exams check for cuts, injuries, or other problems with the feet.  You should have a complete foot exam performed every year. This exam includes an inspection of the structure and skin of your feet, a check of the pulses in your feet, and a check of the sensation in your feet.  Type 1 diabetes: The first exam is performed 5 years after diagnosis.  Type 2 diabetes: The first exam is performed at the time of diagnosis.  Check your feet nightly for cuts, injuries, or other problems with your feet. Tell your health care provider if anything is not healing. Kidney function test (urine microalbumin)  This test is performed once a year.  Type 1 diabetes: The first test is performed 5 years after diagnosis.  Type 2 diabetes: The first test is performed at the time of diagnosis.  A serum creatinine and estimated glomerular  filtration rate (eGFR) test is done once a year to assess the level of chronic kidney disease (CKD), if present. Lipid profile (cholesterol, HDL, LDL, triglycerides)  Performed every 5 years for most people.  The goal for LDL is less than 100 mg/dL. If you are at high risk, the goal is less than 70 mg/dL.  The goal for HDL is 40 mg/dL-50 mg/dL for men and 50 mg/dL-60 mg/dL for women. An HDL cholesterol of 60 mg/dL or higher gives some protection against heart disease.  The goal for triglycerides is less than 150 mg/dL. Immunizations  The flu (influenza) vaccine is recommended yearly for every person 72 months of age or older who has diabetes.  The pneumonia (pneumococcal) vaccine is recommended for every person 26 years of age or older who has diabetes. Adults 75 years of age or older may receive the pneumonia vaccine as a series of two separate shots.  The hepatitis B vaccine is recommended for adults shortly after they have been diagnosed with diabetes.  The Tdap (tetanus, diphtheria, and pertussis) vaccine should be given:  According to normal childhood vaccination schedules, for children.  Every 10 years, for adults who have diabetes. Diabetes self-management education  Education is recommended at diagnosis and ongoing as needed. Treatment plan  Your treatment plan is reviewed at every medical visit.   This information is not intended to replace advice given to you by your health care provider. Make sure  you discuss any questions you have with your health care provider.   Document Released: 12/20/2008 Document Revised: 03/15/2014 Document Reviewed: 07/25/2012 Elsevier Interactive Patient Education [employer].

## 2015-02-25 NOTE — Assessment & Plan Note (Signed)
Metformin changed to Actos 11/2012 due to increased creatinine. Pt reports compliance with medications and no adverse reactions Infrequent checks home CBGs because of tremor Will recheck A1C, lipids today.  Refer to renal to monitor associated CKD On statin, ARB and ASA 81 qd  Lab Results  Component Value Date   HGBA1C 6.4 10/15/2013

## 2015-02-25 NOTE — Assessment & Plan Note (Signed)
  BP Readings from Last 3 Encounters:  02/25/15 130/60  05/28/14 136/82  11/16/13 169/75   Increased ARB dose in 05/2010 - improved, patient reports improved compliance The current medical regimen appears generally effective;  continue present plan and medications.

## 2015-02-25 NOTE — Assessment & Plan Note (Signed)
On stain: changed from simva to atorva 09/2010  Pt reports variable compliance check annually and adjust prn Educated on importance of med and diet compliance

## 2015-03-18 ENCOUNTER — Telehealth: Payer: Self-pay

## 2015-03-18 NOTE — Telephone Encounter (Signed)
Recd fax to dr Maryclare Labrador attn. Requesting meds for this patient to be sent thru mail order----dr leschber never prescribed these meds for patient (not on patient med list, total of 5 meds), i have left message on patient answering machine to call our office to schedule appt to get established with new PCP, dr Asa Lente no longer here----paper fax is on tamara's desk waiting for patient to call back in to make appt with new primary care provider, then meds can be requested thru mail order as long as new PCP agrees to fill these new meds

## 2015-06-23 ENCOUNTER — Ambulatory Visit (INDEPENDENT_AMBULATORY_CARE_PROVIDER_SITE_OTHER): Payer: Commercial Managed Care - HMO | Admitting: Ophthalmology

## 2015-06-26 ENCOUNTER — Ambulatory Visit: Payer: Commercial Managed Care - HMO | Admitting: Internal Medicine

## 2015-08-05 ENCOUNTER — Telehealth: Payer: Self-pay | Admitting: Internal Medicine

## 2015-08-05 NOTE — Telephone Encounter (Signed)
Pt was needing a [employer] referral for her eye dr's appt.  She needs to schedule a transfer appt with another provider before we do any other [facility] referrals. Left msg on pts vm

## 2015-08-26 ENCOUNTER — Other Ambulatory Visit (INDEPENDENT_AMBULATORY_CARE_PROVIDER_SITE_OTHER): Payer: Commercial Managed Care - HMO

## 2015-08-26 ENCOUNTER — Encounter: Payer: Self-pay | Admitting: Internal Medicine

## 2015-08-26 ENCOUNTER — Ambulatory Visit (INDEPENDENT_AMBULATORY_CARE_PROVIDER_SITE_OTHER): Payer: Commercial Managed Care - HMO | Admitting: Internal Medicine

## 2015-08-26 VITALS — BP 126/60 | HR 72 | Temp 98.3°F | Resp 16 | Wt 225.0 lb

## 2015-08-26 DIAGNOSIS — E1329 Other specified diabetes mellitus with other diabetic kidney complication: Secondary | ICD-10-CM | POA: Diagnosis not present

## 2015-08-26 DIAGNOSIS — E1365 Other specified diabetes mellitus with hyperglycemia: Secondary | ICD-10-CM | POA: Diagnosis not present

## 2015-08-26 DIAGNOSIS — I10 Essential (primary) hypertension: Secondary | ICD-10-CM

## 2015-08-26 DIAGNOSIS — N189 Chronic kidney disease, unspecified: Secondary | ICD-10-CM | POA: Diagnosis not present

## 2015-08-26 DIAGNOSIS — D539 Nutritional anemia, unspecified: Secondary | ICD-10-CM

## 2015-08-26 DIAGNOSIS — R059 Cough, unspecified: Secondary | ICD-10-CM | POA: Insufficient documentation

## 2015-08-26 DIAGNOSIS — R109 Unspecified abdominal pain: Secondary | ICD-10-CM

## 2015-08-26 DIAGNOSIS — R05 Cough: Secondary | ICD-10-CM

## 2015-08-26 DIAGNOSIS — R1012 Left upper quadrant pain: Secondary | ICD-10-CM

## 2015-08-26 DIAGNOSIS — IMO0002 Reserved for concepts with insufficient information to code with codable children: Secondary | ICD-10-CM

## 2015-08-26 LAB — CBC WITH DIFFERENTIAL/PLATELET
Basophils Absolute: 0 10*3/uL (ref 0.0–0.1)
Basophils Relative: 0.4 % (ref 0.0–3.0)
Eosinophils Absolute: 0.2 10*3/uL (ref 0.0–0.7)
Eosinophils Relative: 2.4 % (ref 0.0–5.0)
HCT: 30.1 % — ABNORMAL LOW (ref 36.0–46.0)
Hemoglobin: 9.7 g/dL — ABNORMAL LOW (ref 12.0–15.0)
Lymphocytes Relative: 36.2 % (ref 12.0–46.0)
Lymphs Abs: 2.8 10*3/uL (ref 0.7–4.0)
MCHC: 32.2 g/dL (ref 30.0–36.0)
MCV: 87.4 fl (ref 78.0–100.0)
Monocytes Absolute: 0.8 10*3/uL (ref 0.1–1.0)
Monocytes Relative: 10.3 % (ref 3.0–12.0)
Neutro Abs: 3.9 10*3/uL (ref 1.4–7.7)
Neutrophils Relative %: 50.7 % (ref 43.0–77.0)
Platelets: 185 10*3/uL (ref 150.0–400.0)
RBC: 3.44 Mil/uL — ABNORMAL LOW (ref 3.87–5.11)
RDW: 14.4 % (ref 11.5–15.5)
WBC: 7.7 10*3/uL (ref 4.0–10.5)

## 2015-08-26 LAB — COMPREHENSIVE METABOLIC PANEL WITH GFR
ALT: 12 U/L (ref 0–35)
AST: 18 U/L (ref 0–37)
Albumin: 4.1 g/dL (ref 3.5–5.2)
Alkaline Phosphatase: 49 U/L (ref 39–117)
BUN: 39 mg/dL — ABNORMAL HIGH (ref 6–23)
CO2: 26 meq/L (ref 19–32)
Calcium: 9.9 mg/dL (ref 8.4–10.5)
Chloride: 109 meq/L (ref 96–112)
Creatinine, Ser: 2.09 mg/dL — ABNORMAL HIGH (ref 0.40–1.20)
GFR: 29.66 mL/min — ABNORMAL LOW
Glucose, Bld: 106 mg/dL — ABNORMAL HIGH (ref 70–99)
Potassium: 6 meq/L — ABNORMAL HIGH (ref 3.5–5.1)
Sodium: 140 meq/L (ref 135–145)
Total Bilirubin: 0.5 mg/dL (ref 0.2–1.2)
Total Protein: 7.7 g/dL (ref 6.0–8.3)

## 2015-08-26 LAB — VITAMIN B12: Vitamin B-12: 242 pg/mL (ref 211–911)

## 2015-08-26 LAB — IBC PANEL
Iron: 76 ug/dL (ref 42–145)
SATURATION RATIOS: 22.7 % (ref 20.0–50.0)
TRANSFERRIN: 239 mg/dL (ref 212.0–360.0)

## 2015-08-26 LAB — RETICULOCYTES
ABS Retic: 44460 cells/uL (ref 20000–80000)
RBC.: 3.42 MIL/uL — ABNORMAL LOW (ref 3.80–5.10)
RETIC CT PCT: 1.3 %

## 2015-08-26 LAB — HEMOGLOBIN A1C: Hgb A1c MFr Bld: 6.1 % (ref 4.6–6.5)

## 2015-08-26 LAB — FOLATE: Folate: 9 ng/mL (ref >5.9)

## 2015-08-26 LAB — FERRITIN: FERRITIN: 93.1 ng/mL (ref 10.0–291.0)

## 2015-08-26 LAB — TSH: TSH: 1.14 u[IU]/mL (ref 0.35–4.50)

## 2015-08-26 NOTE — Progress Notes (Signed)
Subjective:  Patient ID: Erin Liu, female    DOB: Jan 31, 1941  Age: 75 y.o. MRN: [id number]  CC: Cough and Anemia  NEW TO ME  HPI [name] presents for 2-3 week history of cough that is productive of scant amount of white phlegm. She also complains of pain in her left lower rib cage over the flank. She says the cough is worse at night when she lays down to go to sleep. She has not coughed up blood and she denies fevers, chills, night sweats, wheezing, diaphoresis, dysuria, or hematuria.  Outpatient Prescriptions Prior to Visit  Medication Sig Dispense Refill  . amLODipine (NORVASC) 10 MG tablet Take 1 tablet (10 mg total) by mouth daily. 90 tablet 3  . atenolol (TENORMIN) 50 MG tablet Take 1 tablet (50 mg total) by mouth daily. 90 tablet 3  . atorvastatin (LIPITOR) 40 MG tablet Take 1 tablet (40 mg total) by mouth daily. 90 tablet 3  . latanoprost (XALATAN) 0.005 % ophthalmic solution     . valsartan (DIOVAN) 160 MG tablet Take 1 tablet (160 mg total) by mouth daily. 90 tablet 3  . acetaminophen (TYLENOL) 500 MG tablet Take 1 tablet (500 mg total) by mouth every 6 (six) hours as needed for fever. (Patient not taking: Reported on 08/26/2015) 30 tablet 0  . Blood Glucose Monitoring Suppl (ACCU-CHEK NANO SMARTVIEW) W/DEVICE KIT Use kit to test blood sugar as directed by physician (Patient not taking: Reported on 08/26/2015) 1 kit 0  . DUREZOL 0.05 % EMUL Reported on 08/26/2015  0  . glucose blood test strip Use as instructed (Patient not taking: Reported on 08/26/2015) 300 each 3  . Lancets (ACCU-CHEK SOFT TOUCH) lancets Use as instructed (Patient not taking: Reported on 08/26/2015) 300 each 3  . Lancets Misc. (ACCU-CHEK FASTCLIX LANCET) KIT Use to check blood sugar as directed by your physician. (Patient not taking: Reported on 08/26/2015) 1 kit 0  . pioglitazone (ACTOS) 30 MG tablet Take 1 tablet (30 mg total) by mouth daily. (Patient not taking: Reported on 08/26/2015) 90 tablet 3  .  traMADol (ULTRAM) 50 MG tablet Take 1 tablet (50 mg total) by mouth every 8 (eight) hours as needed. (Patient not taking: Reported on 08/26/2015) 30 tablet 0   No facility-administered medications prior to visit.    ROS Review of Systems  Constitutional: Negative.  Negative for fever, chills, diaphoresis, activity change, appetite change, fatigue and unexpected weight change.  HENT: Negative.   Eyes: Negative.  Negative for visual disturbance.  Respiratory: Positive for cough. Negative for apnea, choking, chest tightness, shortness of breath, wheezing and stridor.   Cardiovascular: Negative.  Negative for chest pain, palpitations and leg swelling.  Gastrointestinal: Negative.  Negative for nausea, vomiting, abdominal pain, diarrhea and constipation.  Endocrine: Negative.   Genitourinary: Positive for flank pain. Negative for dysuria, urgency, frequency, hematuria, decreased urine volume and difficulty urinating.  Musculoskeletal: Negative.   Skin: Negative.   Allergic/Immunologic: Negative.   Neurological: Negative.  Negative for dizziness.  Hematological: Negative.  Negative for adenopathy. Does not bruise/bleed easily.  Psychiatric/Behavioral: Negative.     Objective:  BP 126/60 mmHg  Pulse 72  Temp(Src) 98.3 F (36.8 C) (Oral)  Resp 16  Wt 225 lb (102.059 kg)  SpO2 98%  BP Readings from Last 3 Encounters:  08/26/15 126/60  02/25/15 130/60  05/28/14 136/82    Wt Readings from Last 3 Encounters:  08/26/15 225 lb (102.059 kg)  02/25/15 229 lb (103.874 kg)  05/28/14 228 lb 4 oz (103.534 kg)    Physical Exam  Constitutional: She is oriented to person, place, and time. No distress.  HENT:  Mouth/Throat: Oropharynx is clear and moist. Mucous membranes are pale, not dry and not cyanotic. No oral lesions. No trismus in the jaw. No uvula swelling. No oropharyngeal exudate.  Eyes: Conjunctivae are normal. Right eye exhibits no discharge. Left eye exhibits no discharge. No  scleral icterus.  Neck: Normal range of motion. Neck supple. No JVD present. No tracheal deviation present. No thyromegaly present.  Cardiovascular: Normal rate, regular rhythm, normal heart sounds and intact distal pulses.  Exam reveals no gallop and no friction rub.   No murmur heard. Pulmonary/Chest: Effort normal and breath sounds normal. No stridor. No respiratory distress. She has no wheezes. She has no rales. She exhibits no tenderness.  Abdominal: Soft. Normal appearance and bowel sounds are normal. She exhibits no distension and no mass. There is no hepatosplenomegaly, splenomegaly or hepatomegaly. There is no tenderness. There is no rebound, no guarding and no CVA tenderness.  Musculoskeletal: Normal range of motion. She exhibits no edema or tenderness.  Lymphadenopathy:    She has no cervical adenopathy.  Neurological: She is oriented to person, place, and time.  Skin: Skin is warm and dry. No rash noted. She is not diaphoretic. No erythema. No pallor.  Vitals reviewed.   Lab Results  Component Value Date   WBC 7.7 08/26/2015   HGB 9.7* 08/26/2015   HCT 30.1* 08/26/2015   PLT 185.0 08/26/2015   GLUCOSE 106* 08/26/2015   CHOL 210* 02/25/2015   TRIG 91.0 02/25/2015   HDL 62.30 02/25/2015   LDLDIRECT 230.5 09/11/2010   LDLCALC 130* 02/25/2015   ALT 12 08/26/2015   AST 18 08/26/2015   NA 140 08/26/2015   K 6.0* 08/26/2015   CL 109 08/26/2015   CREATININE 2.09* 08/26/2015   BUN 39* 08/26/2015   CO2 26 08/26/2015   TSH 1.14 08/26/2015   HGBA1C 6.1 08/26/2015   MICROALBUR 5.3* 02/25/2015    US Renal  11/21/2014  CLINICAL DATA:  Chronic kidney disease, stage III (moderate) N18.3 (ICD-10-CM) Hypertension EXAM: RENAL / URINARY TRACT ULTRASOUND COMPLETE COMPARISON:  None. FINDINGS: Right Kidney: Length: 9.7 cm. Echogenicity within normal limits. No mass or hydronephrosis visualized. Left Kidney: Length: 9.1 cm. Echogenicity within normal limits. No mass or hydronephrosis  visualized. Bladder: Appears normal for degree of bladder distention. Incidental note is made of dependent gallstones. IMPRESSION: 1. Normal sonographic appearance of the kidneys.  No hydronephrosis. 2. Cholelithiasis. Electronically Signed   By: Lajean Manes M.D.   On: 11/21/2014 10:08    Assessment & Plan:   Brytni was seen today for cough and anemia.  Diagnoses and all orders for this visit:  RENAL FAILURE, CHRONIC- Her potassium level is elevated at 6, I've asked to return as soon as possible for me to recheck a potassium level and monitor EKG for any signs of cardiac toxicity from hyperkalemia, otherwise her renal function is stable and she tells me that she will soon be seeing a nephrologist. 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 -     CBC with Differential/Platelet; Future -     IBC panel; Future -     Ferritin; Future -     Folate; Future -     Reticulocytes; Future -     Vitamin B12; Future -     Vitamin B1; Future  Essential hypertension- her blood pressure is adequately well controlled, will address the renal dysfunction and hyperkalemia -     Comprehensive metabolic panel; Future -     TSH; Future  Cough- will check a chest x-ray to see if there is signs of pulmonary edema, mass, infiltrate, or pneumothorax. -     DG Abd Acute W/Chest; Future  Acute left flank pain- her exam is unremarkable, will check a plain film to see if there is an abnormality in the rib cage or to see if I can catch a stone on plain film, her renal function is stable and a urinalysis is negative for hematuria. This is most likely acute  musculoskeletal pain. -     DG Abd Acute W/Chest; Future  DM (diabetes mellitus), secondary, uncontrolled, with renal complications ([place])- her A1c is 6.1% indicating that her blood sugars are well-controlled. -     Hemoglobin A1c; Future   I am having Ms. Rhatigan maintain her glucose blood, accu-chek soft touch, ACCU-CHEK FASTCLIX LANCET, pioglitazone, valsartan, ACCU-CHEK NANO SMARTVIEW, amLODipine, atenolol, atorvastatin, latanoprost, DUREZOL, traMADol, and acetaminophen.  No orders of the defined types were placed in this encounter.     Follow-up: Return in about 1 week (around 09/02/2015).  Scarlette Calico, MD

## 2015-08-26 NOTE — Progress Notes (Signed)
Pre visit review using our clinic review tool, if applicable. No additional management support is needed unless otherwise documented below in the visit note. 

## 2015-08-26 NOTE — Patient Instructions (Signed)

## 2015-08-27 ENCOUNTER — Encounter: Payer: Self-pay | Admitting: Internal Medicine

## 2015-08-27 LAB — URINALYSIS, ROUTINE W REFLEX MICROSCOPIC
BILIRUBIN URINE: NEGATIVE
Hgb urine dipstick: NEGATIVE
Ketones, ur: NEGATIVE
Leukocytes, UA: NEGATIVE
Nitrite: NEGATIVE
PH: 5.5 (ref 5.0–8.0)
RBC / HPF: NONE SEEN (ref 0–?)
SPECIFIC GRAVITY, URINE: 1.01 (ref 1.000–1.030)
Total Protein, Urine: NEGATIVE
Urine Glucose: NEGATIVE
Urobilinogen, UA: 0.2 (ref 0.0–1.0)

## 2015-08-28 MED ORDER — CYANOCOBALAMIN 2000 MCG PO TABS: 2000.0000 ug | ORAL_TABLET | Freq: Every day | ORAL | Status: DC

## 2015-08-29 ENCOUNTER — Other Ambulatory Visit (INDEPENDENT_AMBULATORY_CARE_PROVIDER_SITE_OTHER): Payer: Commercial Managed Care - HMO

## 2015-08-29 ENCOUNTER — Ambulatory Visit (INDEPENDENT_AMBULATORY_CARE_PROVIDER_SITE_OTHER)
Admission: RE | Admit: 2015-08-29 | Discharge: 2015-08-29 | Disposition: A | Discharge status: Home / Self Care | Payer: Commercial Managed Care - HMO | Source: Ambulatory Visit | Attending: Internal Medicine | Admitting: Internal Medicine

## 2015-08-29 ENCOUNTER — Ambulatory Visit (INDEPENDENT_AMBULATORY_CARE_PROVIDER_SITE_OTHER): Payer: Commercial Managed Care - HMO | Admitting: Internal Medicine

## 2015-08-29 ENCOUNTER — Encounter: Payer: Self-pay | Admitting: Internal Medicine

## 2015-08-29 VITALS — BP 138/60 | HR 79 | Ht 65.0 in | Wt 228.0 lb

## 2015-08-29 DIAGNOSIS — N189 Chronic kidney disease, unspecified: Secondary | ICD-10-CM

## 2015-08-29 DIAGNOSIS — R05 Cough: Secondary | ICD-10-CM

## 2015-08-29 DIAGNOSIS — IMO0002 Reserved for concepts with insufficient information to code with codable children: Secondary | ICD-10-CM

## 2015-08-29 DIAGNOSIS — E1329 Other specified diabetes mellitus with other diabetic kidney complication: Secondary | ICD-10-CM | POA: Diagnosis not present

## 2015-08-29 DIAGNOSIS — R1012 Left upper quadrant pain: Secondary | ICD-10-CM | POA: Diagnosis not present

## 2015-08-29 DIAGNOSIS — I10 Essential (primary) hypertension: Secondary | ICD-10-CM

## 2015-08-29 DIAGNOSIS — R109 Unspecified abdominal pain: Secondary | ICD-10-CM

## 2015-08-29 DIAGNOSIS — E785 Hyperlipidemia, unspecified: Secondary | ICD-10-CM

## 2015-08-29 DIAGNOSIS — E1365 Other specified diabetes mellitus with hyperglycemia: Secondary | ICD-10-CM

## 2015-08-29 DIAGNOSIS — R059 Cough, unspecified: Secondary | ICD-10-CM

## 2015-08-29 LAB — COMPREHENSIVE METABOLIC PANEL
ALT: 11 U/L (ref 0–35)
AST: 19 U/L (ref 0–37)
Albumin: 4.1 g/dL (ref 3.5–5.2)
Alkaline Phosphatase: 51 U/L (ref 39–117)
BUN: 41 mg/dL — ABNORMAL HIGH (ref 6–23)
CO2: 27 mEq/L (ref 19–32)
Calcium: 9.8 mg/dL (ref 8.4–10.5)
Chloride: 108 mEq/L (ref 96–112)
Creatinine, Ser: 1.83 mg/dL — ABNORMAL HIGH (ref 0.40–1.20)
GFR: 34.57 mL/min — ABNORMAL LOW (ref >60.00)
Glucose, Bld: 97 mg/dL (ref 70–99)
Potassium: 5.5 mEq/L — ABNORMAL HIGH (ref 3.5–5.1)
Sodium: 139 mEq/L (ref 135–145)
Total Bilirubin: 0.5 mg/dL (ref 0.2–1.2)
Total Protein: 7.6 g/dL (ref 6.0–8.3)

## 2015-08-29 MED ORDER — VALSARTAN 160 MG PO TABS: 160.0000 mg | ORAL_TABLET | Freq: Every day | ORAL | Status: DC

## 2015-08-29 MED ORDER — ATORVASTATIN CALCIUM 40 MG PO TABS: 40.0000 mg | ORAL_TABLET | Freq: Every day | ORAL | Status: DC

## 2015-08-29 MED ORDER — BLOOD GLUCOSE MONITOR KIT: PACK | Status: DC

## 2015-08-29 MED ORDER — AMLODIPINE BESYLATE 10 MG PO TABS: 10.0000 mg | ORAL_TABLET | Freq: Every day | ORAL | Status: DC

## 2015-08-29 MED ORDER — ATENOLOL 50 MG PO TABS: 50.0000 mg | ORAL_TABLET | Freq: Every day | ORAL | Status: DC

## 2015-08-29 NOTE — Assessment & Plan Note (Addendum)
Lab Results  Component Value Date   HGBA1C 6.1 08/26/2015   She was not taking her actos for at least one month  - a1c off medication for one month was 6.1% Will stop medication for now given low a1c Does not have glucometer at home - rx sent to [facility] -- she is not sure is she can do it herself - has tremor F/u in 3 months

## 2015-08-29 NOTE — Assessment & Plan Note (Signed)
BP well controlled Current regimen effective and well tolerated Continue current medications at current doses Repeat cmp

## 2015-08-29 NOTE — Progress Notes (Signed)
Subjective:    Patient ID: Erin Liu, female    DOB: 16-Dec-1940, 75 y.o.   MRN: [id number]  HPI She is here to establish with a new pcp.    Cough, left sided pain:  She was seen a few days ago for cough and left sided pain.  The cough is almost completely gone, but she is still having some left flank pain.  It only hurts when she walks.  Sitting and laying down makes it better.  It does not hurt when she touches that area.  An abdominal xray was ordered, but she did not have it done.  She states the pain has been going on for a short period of time, but looking back at her records she had this pain for almost one year.  She has taken tramadol as needed and aleve as needed.    Hypertension: She is taking her medication daily. She is compliant with a low sodium diet.  She denies chest pain, palpitations, edema, shortness of breath and regular headaches. She is not  exercising regularly.  She does not monitor her blood pressure at home.    Hyperlipidemia: She is taking her medication daily. She is compliant with a low fat/cholesterol diet. She is not exercising regularly. She denies myalgias.   Chronic kidney disease:  She is following with [facility].  She does take aleve as needed, not daily.    Diabetes: She is not taking her medication daily - she states she has not taken it for one month. She is compliant with a diabetic diet. She is not exercising regularly. She does not monitor her sugars at home.      Medications and allergies reviewed with patient and updated if appropriate.  Patient Active Problem List   Diagnosis Date Noted  . Cough 08/26/2015  . Acute left flank pain 08/26/2015  . Lumbago 02/25/2015  . Essential tremor   . Glaucoma 01/08/2010  . RENAL FAILURE, CHRONIC 12/10/2008  . DM (diabetes mellitus), secondary, uncontrolled, with renal complications ([place]) 22/48/2500  . Diabetic neuropathy ([place]) 02/22/2006  . Dyslipidemia 02/22/2006  .  OBESITY 02/22/2006  . 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 CARPAL TUNNEL SYNDROME, BILATERAL 02/22/2006  . Essential hypertension 02/22/2006  . CONSTIPATION 02/22/2006    Current Outpatient Prescriptions on File Prior to Visit  Medication Sig Dispense Refill  . acetaminophen (TYLENOL) 500 MG tablet Take 1 tablet (500 mg total) by mouth every 6 (six) hours as needed for fever. (Patient not taking: Reported on 08/26/2015) 30 tablet 0  . amLODipine (NORVASC) 10 MG tablet Take 1 tablet (10 mg total) by mouth daily. 90 tablet 3  . atenolol (TENORMIN) 50 MG tablet Take 1 tablet (50 mg total) by mouth daily. 90 tablet 3  . atorvastatin (LIPITOR) 40 MG tablet Take 1 tablet (40 mg total) by mouth daily. 90 tablet 3  . Blood Glucose Monitoring Suppl (ACCU-CHEK NANO SMARTVIEW) W/DEVICE KIT Use kit to test blood sugar as directed by physician (Patient not taking: Reported on 08/26/2015) 1 kit 0  . cyanocobalamin 2000 MCG tablet Take 1 tablet (2,000 mcg total) by mouth daily. 90 tablet 3  . DUREZOL 0.05 % EMUL Reported on 08/26/2015  0  . glucose blood test strip Use as instructed (Patient not taking: Reported on 08/26/2015) 300 each 3  . Lancets (ACCU-CHEK SOFT TOUCH) lancets Use as instructed (Patient not taking: Reported on 08/26/2015) 300 each 3  . Lancets Misc. (ACCU-CHEK FASTCLIX LANCET) KIT Use  to check blood sugar as directed by your physician. (Patient not taking: Reported on 08/26/2015) 1 kit 0  . latanoprost (XALATAN) 0.005 % ophthalmic solution     . pioglitazone (ACTOS) 30 MG tablet Take 1 tablet (30 mg total) by mouth daily. (Patient not taking: Reported on 08/26/2015) 90 tablet 3  . traMADol (ULTRAM) 50 MG tablet Take 1 tablet (50 mg total) by mouth every 8 (eight) hours as needed. (Patient not taking: Reported on 08/26/2015) 30 tablet 0  . valsartan (DIOVAN) 160 MG tablet Take 1 tablet (160 mg total) by mouth daily. 90 tablet 3   No current facility-administered medications on file prior to visit.     Past Medical History  Diagnosis Date  . ANEMIA, NORMOCYTIC   . BREAST CYST, HX OF   . RENAL FAILURE, CHRONIC   . OBESITY   . HYPERTENSION   . HYPERLIPIDEMIA   . GLUCOMA   . DIABETES MELLITUS, TYPE II   . CARPAL TUNNEL SYNDROME, BILATERAL   . PERIPHERAL NEUROPATHY     Past Surgical History  Procedure Laterality Date  . Cataract extraction bilateral w/ anterior vitrectomy  05/2010    Social History   Social History  . Marital Status: Widowed    Spouse Name: N/A  . Number of Children: N/A  . Years of Education: N/A   Social History Main Topics  . Smoking status: Never Smoker   . Smokeless tobacco: None  . Alcohol Use: No  . Drug Use: None  . Sexual Activity: Not Asked   Other Topics Concern  . None   Social History Narrative   Widowed, lives with son. Retired Medical illustrator    Family History  Problem Relation Age of Onset  . Arthritis Other   . Alcohol abuse Other   . Diabetes Other     parent, grandparent  . Hypertension Other     parent, grandparent and other    Review of Systems  Constitutional: Negative for fever and appetite change.  Respiratory: Negative for cough, shortness of breath and wheezing.   Cardiovascular: Negative for chest pain, palpitations and leg swelling.  Gastrointestinal: Negative for nausea and abdominal pain.       No gerd  Genitourinary: Negative for dysuria and hematuria.  Neurological: Negative for dizziness, light-headedness and headaches.       Objective:   Filed Vitals:   08/29/15 1452  BP: 138/60  Pulse: 79   Filed Weights   08/29/15 1452  Weight: 228 lb (103.42 kg)   Body mass index is 37.94 kg/(m^2).   Physical Exam Constitutional: Appears well-developed and well-nourished. No distress.  Neck: Neck supple. No tracheal deviation present. No thyromegaly present.  No carotid bruit. No cervical adenopathy.   Cardiovascular: Normal rate, regular rhythm and normal heart sounds.   No murmur heard.  Mild LE  edema Pulmonary/Chest: Effort normal and breath sounds normal. No respiratory distress. No wheezes.  Msk;  No pain on left side with palpation, no pain in lumbar or thoracic spine. No rib pain.       Assessment & Plan:   See Problem List for Assessment and Plan of chronic medical problems.

## 2015-08-29 NOTE — Patient Instructions (Signed)
Have an xray done today of your stomach.   Test(s) ordered today. Your results will be released to [place] (or called to you) after review, usually within 72hours after test completion. If any changes need to be made, you will be notified at that same time.   Medications reviewed and updated.  Changes include stopping the actos (pioglitazone) for your diabetes.   Your prescription(s) have been submitted to your pharmacy. Please take as directed and contact our office if you believe you are having problem(s) with the medication(s).  A referral was ordered for Dr Tamala Julian for your left sided pain.   Please followup in 3 months

## 2015-08-29 NOTE — Progress Notes (Signed)
Pre visit review using our clinic review tool, if applicable. No additional management support is needed unless otherwise documented below in the visit note. 

## 2015-08-29 NOTE — Assessment & Plan Note (Addendum)
Takes aleve often for pain Stressed she should not be taking any aleve/advil/motrin Take tylenol as needed cmp today Follows with [facility]

## 2015-08-30 DIAGNOSIS — R109 Unspecified abdominal pain: Secondary | ICD-10-CM | POA: Insufficient documentation

## 2015-08-30 NOTE — Assessment & Plan Note (Signed)
Taking atorvastatin 40mg  daily Will check lipids at next visit

## 2015-08-30 NOTE — Assessment & Plan Note (Signed)
Chronic - looks like she has had this for at least one year ? 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

## 2015-08-31 LAB — VITAMIN B1: VITAMIN B1 (THIAMINE): 11 nmol/L (ref 8–30)

## 2015-09-01 ENCOUNTER — Encounter: Payer: Self-pay | Admitting: Emergency Medicine

## 2015-09-01 ENCOUNTER — Telehealth: Payer: Self-pay | Admitting: Emergency Medicine

## 2015-09-01 MED ORDER — ATENOLOL 100 MG PO TABS: 100.0000 mg | ORAL_TABLET | Freq: Every day | ORAL | Status: DC

## 2015-09-01 MED ORDER — VALSARTAN 80 MG PO TABS: 80.0000 mg | ORAL_TABLET | Freq: Every day | ORAL | Status: DC

## 2015-09-01 NOTE — Telephone Encounter (Signed)
-----   Message from Binnie Rail, MD sent at 08/30/2015  3:05 PM EDT ----- Her potassium is still high, her kidney function is stable.  We need to make some medication changes.  Decrease diovan to 80 mg daily - will need a new prescription and increased atenolol to 100mg  daily (can take two of what she has at home)

## 2015-09-01 NOTE — Telephone Encounter (Signed)
Change in medications sent to [facility]

## 2015-09-02 DIAGNOSIS — Z961 Presence of intraocular lens: Secondary | ICD-10-CM | POA: Diagnosis not present

## 2015-09-02 DIAGNOSIS — H401132 Primary open-angle glaucoma, bilateral, moderate stage: Secondary | ICD-10-CM | POA: Diagnosis not present

## 2015-09-02 DIAGNOSIS — H35033 Hypertensive retinopathy, bilateral: Secondary | ICD-10-CM | POA: Diagnosis not present

## 2015-09-02 DIAGNOSIS — E113593 Type 2 diabetes mellitus with proliferative diabetic retinopathy without macular edema, bilateral: Secondary | ICD-10-CM | POA: Diagnosis not present

## 2015-09-22 ENCOUNTER — Ambulatory Visit: Payer: Commercial Managed Care - HMO | Admitting: Family Medicine

## 2015-09-29 DIAGNOSIS — D631 Anemia in chronic kidney disease: Secondary | ICD-10-CM | POA: Diagnosis not present

## 2015-09-29 DIAGNOSIS — N184 Chronic kidney disease, stage 4 (severe): Secondary | ICD-10-CM | POA: Diagnosis not present

## 2015-09-29 DIAGNOSIS — N2581 Secondary hyperparathyroidism of renal origin: Secondary | ICD-10-CM | POA: Diagnosis not present

## 2015-09-29 DIAGNOSIS — N183 Chronic kidney disease, stage 3 (moderate): Secondary | ICD-10-CM | POA: Diagnosis not present

## 2015-09-29 DIAGNOSIS — E669 Obesity, unspecified: Secondary | ICD-10-CM | POA: Diagnosis not present

## 2015-09-29 DIAGNOSIS — I10 Essential (primary) hypertension: Secondary | ICD-10-CM | POA: Diagnosis not present

## 2015-10-29 ENCOUNTER — Encounter: Payer: Self-pay | Admitting: Gastroenterology

## 2015-10-31 ENCOUNTER — Ambulatory Visit: Payer: Commercial Managed Care - HMO | Admitting: Internal Medicine

## 2015-10-31 NOTE — Progress Notes (Deleted)
Subjective:    Patient ID: Erin Liu, female    DOB: 1940/07/21, 75 y.o.   MRN: [id number]  HPI She is here to establish with a new pcp.  The patient is here for follow up.  Diabetes: She controlling her diabetes with diet. She is compliant with a diabetic diet. She is exercising regularly. She monitors her sugars and they have been running XXX. She checks her feet daily and denies foot lesions. She is up-to-date with an ophthalmology examination.   CKD:  She follows with [facility].    Hypertension: She is taking her medication daily. She is compliant with a low sodium diet.  She denies chest pain, palpitations, edema, shortness of breath and regular headaches. She is exercising regularly.  She does not monitor her blood pressure at home.     Medications and allergies reviewed with patient and updated if appropriate.  Patient Active Problem List   Diagnosis Date Noted  . Left flank pain 08/30/2015  . Cough 08/26/2015  . Lumbago 02/25/2015  . Essential tremor   . Glaucoma 01/08/2010  . RENAL FAILURE, CHRONIC 12/10/2008  . DM (diabetes mellitus), secondary, uncontrolled, with renal complications ([place]) 67/34/1937  . Diabetic neuropathy ([place]) 02/22/2006  . Dyslipidemia 02/22/2006  . OBESITY 02/22/2006  . 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 CARPAL TUNNEL SYNDROME, BILATERAL 02/22/2006  . Essential hypertension 02/22/2006  . CONSTIPATION 02/22/2006    Current Outpatient Prescriptions on File Prior to Visit  Medication Sig Dispense Refill  . amLODipine (NORVASC) 10 MG tablet Take 1 tablet (10 mg total) by mouth daily. 90 tablet 3  . atenolol (TENORMIN) 100 MG tablet Take 1 tablet (100 mg total) by mouth daily. 90 tablet 1  . atorvastatin (LIPITOR) 40 MG tablet Take 1 tablet (40 mg total) by mouth daily. 90 tablet 3  . blood glucose meter kit and supplies KIT Dispense based on patient and insurance preference. Use up to four times daily as directed. (FOR ICD-9  250.00, 250.01). 1 each 0  . cyanocobalamin 2000 MCG tablet Take 1 tablet (2,000 mcg total) by mouth daily. 90 tablet 3  . DUREZOL 0.05 % EMUL Reported on 08/26/2015  0  . latanoprost (XALATAN) 0.005 % ophthalmic solution     . valsartan (DIOVAN) 80 MG tablet Take 1 tablet (80 mg total) by mouth daily. 90 tablet 1   No current facility-administered medications on file prior to visit.     Past Medical History:  Diagnosis Date  . ANEMIA, NORMOCYTIC   . BREAST CYST, HX OF   . CARPAL TUNNEL SYNDROME, BILATERAL   . DIABETES MELLITUS, TYPE II   . GLUCOMA   . HYPERLIPIDEMIA   . HYPERTENSION   . OBESITY   . PERIPHERAL NEUROPATHY   . RENAL FAILURE, CHRONIC     Past Surgical History:  Procedure Laterality Date  . CATARACT EXTRACTION BILATERAL W/ ANTERIOR VITRECTOMY  05/2010    Social History   Social History  . Marital status: Widowed    Spouse name: N/A  . Number of children: N/A  . Years of education: N/A   Social History Main Topics  . Smoking status: Never Smoker  . Smokeless tobacco: Not on file  . Alcohol use No  . Drug use: Unknown  . Sexual activity: Not on file   Other Topics Concern  . 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 Arthritis Other   . Alcohol abuse Other   . Diabetes Other     parent, grandparent  . Hypertension Other     parent, grandparent and other    Review of Systems     Objective:  There were no vitals filed for this visit. There were no vitals filed for this visit. There is no height or weight on file to calculate BMI.   Physical Exam    Constitutional: Appears well-developed and well-nourished. No distress.  HENT:  Head: Normocephalic and atraumatic.  Neck: Neck supple. No tracheal deviation present. No thyromegaly present.  Cardiovascular: Normal rate, regular rhythm and normal heart sounds.   No murmur heard. No carotid  bruit  Pulmonary/Chest: Effort normal and breath sounds normal. No respiratory distress. No has no wheezes. No rales.  Musculoskeletal: No edema.  Lymphadenopathy: No cervical adenopathy.  Skin: Skin is warm and dry. Not diaphoretic.  Psychiatric: Normal mood and affect. Behavior is normal.     Assessment & Plan:    See Problem List for Assessment and Plan of chronic medical problems.

## 2015-12-15 ENCOUNTER — Encounter: Payer: Self-pay | Admitting: Gastroenterology

## 2015-12-23 DIAGNOSIS — N184 Chronic kidney disease, stage 4 (severe): Secondary | ICD-10-CM | POA: Diagnosis not present

## 2015-12-23 DIAGNOSIS — N2581 Secondary hyperparathyroidism of renal origin: Secondary | ICD-10-CM | POA: Diagnosis not present

## 2016-01-23 DIAGNOSIS — I10 Essential (primary) hypertension: Secondary | ICD-10-CM | POA: Diagnosis not present

## 2016-01-23 DIAGNOSIS — N2581 Secondary hyperparathyroidism of renal origin: Secondary | ICD-10-CM | POA: Diagnosis not present

## 2016-01-23 DIAGNOSIS — D631 Anemia in chronic kidney disease: Secondary | ICD-10-CM | POA: Diagnosis not present

## 2016-01-23 DIAGNOSIS — N184 Chronic kidney disease, stage 4 (severe): Secondary | ICD-10-CM | POA: Diagnosis not present

## 2016-02-10 ENCOUNTER — Encounter: Payer: Commercial Managed Care - HMO | Admitting: Gastroenterology

## 2016-03-15 ENCOUNTER — Encounter: Payer: Self-pay | Admitting: Gastroenterology

## 2016-03-31 ENCOUNTER — Encounter: Payer: Commercial Managed Care - HMO | Admitting: Gastroenterology

## 2016-04-08 ENCOUNTER — Other Ambulatory Visit: Payer: Self-pay | Admitting: *Deleted

## 2016-04-08 MED ORDER — GLUCOSE BLOOD VI STRP: 1.0000 | ORAL_STRIP | Freq: Two times a day (BID) | 2 refills | Status: DC

## 2016-04-08 MED ORDER — TRUE METRIX LEVEL 1 LOW VI SOLN: 0 refills | Status: DC

## 2016-04-08 MED ORDER — TRUE METRIX AIR GLUCOSE METER W/DEVICE KIT: 1.0000 | PACK | Freq: Every day | 0 refills | Status: DC

## 2016-04-08 MED ORDER — BD SWAB SINGLE USE REGULAR PADS: MEDICATED_PAD | 2 refills | Status: DC

## 2016-04-08 MED ORDER — TRUEPLUS LANCETS 33G MISC: 2 refills | Status: DC

## 2016-04-08 NOTE — Telephone Encounter (Signed)
Rec'd fax pt needing rx for [employer] BS monitor w/supplies. Updated chart sent rx to [facility]...Johny Chess

## 2016-04-15 ENCOUNTER — Telehealth: Payer: Self-pay | Admitting: Emergency Medicine

## 2016-04-15 NOTE — Telephone Encounter (Signed)
LVM for pt to call back and schedule appt to follow-up with Dr Quay Burow, she was due for Follow-up in Sept 2017

## 2016-04-26 ENCOUNTER — Ambulatory Visit (AMBULATORY_SURGERY_CENTER): Payer: Self-pay

## 2016-04-26 ENCOUNTER — Encounter: Payer: Self-pay | Admitting: Gastroenterology

## 2016-04-26 VITALS — Ht 65.0 in | Wt 218.0 lb

## 2016-04-26 DIAGNOSIS — Z1211 Encounter for screening for malignant neoplasm of colon: Secondary | ICD-10-CM

## 2016-04-26 MED ORDER — NA SULFATE-K SULFATE-MG SULF 17.5-3.13-1.6 GM/177ML PO SOLN: 1.0000 | Freq: Once | ORAL | 0 refills | Status: AC

## 2016-04-26 NOTE — Progress Notes (Signed)
Denies allergies to eggs or soy products. Denies complication of anesthesia or sedation. Denies use of weight loss medication. Denies use of O2.    Patient declined Emmi.

## 2016-04-30 DIAGNOSIS — N184 Chronic kidney disease, stage 4 (severe): Secondary | ICD-10-CM | POA: Diagnosis not present

## 2016-04-30 DIAGNOSIS — I10 Essential (primary) hypertension: Secondary | ICD-10-CM | POA: Diagnosis not present

## 2016-04-30 DIAGNOSIS — E669 Obesity, unspecified: Secondary | ICD-10-CM | POA: Diagnosis not present

## 2016-04-30 DIAGNOSIS — D631 Anemia in chronic kidney disease: Secondary | ICD-10-CM | POA: Diagnosis not present

## 2016-04-30 DIAGNOSIS — N2581 Secondary hyperparathyroidism of renal origin: Secondary | ICD-10-CM | POA: Diagnosis not present

## 2016-05-10 ENCOUNTER — Encounter: Payer: Self-pay | Admitting: Gastroenterology

## 2016-05-10 ENCOUNTER — Ambulatory Visit (AMBULATORY_SURGERY_CENTER): Payer: Medicare HMO | Admitting: Gastroenterology

## 2016-05-10 VITALS — BP 129/74 | HR 75 | Temp 98.0°F | Resp 23 | Ht 65.0 in | Wt 218.0 lb

## 2016-05-10 DIAGNOSIS — I10 Essential (primary) hypertension: Secondary | ICD-10-CM | POA: Diagnosis not present

## 2016-05-10 DIAGNOSIS — D126 Benign neoplasm of colon, unspecified: Secondary | ICD-10-CM | POA: Diagnosis not present

## 2016-05-10 DIAGNOSIS — Z1212 Encounter for screening for malignant neoplasm of rectum: Secondary | ICD-10-CM

## 2016-05-10 DIAGNOSIS — D123 Benign neoplasm of transverse colon: Secondary | ICD-10-CM | POA: Diagnosis not present

## 2016-05-10 DIAGNOSIS — K635 Polyp of colon: Secondary | ICD-10-CM

## 2016-05-10 DIAGNOSIS — K5730 Diverticulosis of large intestine without perforation or abscess without bleeding: Secondary | ICD-10-CM

## 2016-05-10 DIAGNOSIS — D122 Benign neoplasm of ascending colon: Secondary | ICD-10-CM | POA: Diagnosis not present

## 2016-05-10 DIAGNOSIS — E119 Type 2 diabetes mellitus without complications: Secondary | ICD-10-CM | POA: Diagnosis not present

## 2016-05-10 DIAGNOSIS — Z1211 Encounter for screening for malignant neoplasm of colon: Secondary | ICD-10-CM | POA: Diagnosis present

## 2016-05-10 MED ORDER — SODIUM CHLORIDE 0.9 % IV SOLN
500.0000 mL | INTRAVENOUS | Status: DC
Start: 2016-05-10 — End: 2017-04-09

## 2016-05-10 NOTE — Patient Instructions (Signed)
YOU HAD AN ENDOSCOPIC PROCEDURE TODAY AT [facility]:   Refer to the procedure report that was given to you for any specific questions about what was found during the examination.  If the procedure report does not answer your questions, please call your gastroenterologist to clarify.  If you requested that your care partner not be given the details of your procedure findings, then the procedure report has been included in a sealed envelope for you to review at your convenience later.  YOU SHOULD EXPECT: Some feelings of bloating in the abdomen. Passage of more gas than usual.  Walking can help get rid of the air that was put into your GI tract during the procedure and reduce the bloating. If you had a lower endoscopy (such as a colonoscopy or flexible sigmoidoscopy) you may notice spotting of blood in your stool or on the toilet paper. If you underwent a bowel prep for your procedure, you may not have a normal bowel movement for a few days.  Please Note:  You might notice some irritation and congestion in your nose or some drainage.  This is from the oxygen used during your procedure.  There is no need for concern and it should clear up in a day or so.  SYMPTOMS TO REPORT IMMEDIATELY:   Following lower endoscopy (colonoscopy or flexible sigmoidoscopy):  Excessive amounts of blood in the stool  Significant tenderness or worsening of abdominal pains  Swelling of the abdomen that is new, acute  Fever of 100F or higher   For urgent or emergent issues, a gastroenterologist can be reached at any hour by calling [phone].   DIET:  We do recommend a small meal at first, but then you may proceed to your regular diet.  Drink plenty of fluids but you should avoid alcoholic beverages for 24 hours.  ACTIVITY:  You should plan to take it easy for the rest of today and you should NOT DRIVE or use heavy machinery until tomorrow (because of the sedation medicines used during the test).     FOLLOW UP: Our staff will call the number listed on your records the next business day following your procedure to check on you and address any questions or concerns that you may have regarding the information given to you following your procedure. If we do not reach you, we will leave a message.  However, if you are feeling well and you are not experiencing any problems, there is no need to return our call.  We will assume that you have returned to your regular daily activities without incident.  If any biopsies were taken you will be contacted by phone or by letter within the next 1-3 weeks.  Please call us at [phone] if you have not heard about the biopsies in 3 weeks.  Polyps (handout given) High Fiber Diet (handout given) Diverticulosis (handout given) Await for biopsy results to determined next repeat colonoscopy screening  SIGNATURES/CONFIDENTIALITY: You and/or your care partner have signed paperwork which will be entered into your electronic medical record.  These signatures attest to the fact that that the information above on your After Visit Summary has been reviewed and is understood.  Full responsibility of the confidentiality of this discharge information lies with you and/or your care-partner.

## 2016-05-10 NOTE — Progress Notes (Signed)
Report to PACU, RN, vss, BBS= Clear.  

## 2016-05-10 NOTE — Progress Notes (Signed)
Called to room to assist during endoscopic procedure.  Patient ID and intended procedure confirmed with present staff. Received instructions for my participation in the procedure from the performing physician.  

## 2016-05-10 NOTE — Op Note (Signed)
[facility] Patient Name: Erin Liu Procedure Date: 05/10/2016 7:57 AM MRN: [id number] Endoscopist: Milus Banister , MD Age: 76 Referring MD:  Date of Birth: 1941-01-05 Gender: Female Account #: [id number] Procedure:                Colonoscopy Indications:              Screening for colorectal malignant neoplasm;                            colonoscopy 2007 no polyps Medicines:                Monitored Anesthesia Care Procedure:                Pre-Anesthesia Assessment:                           - Prior to the procedure, a History and Physical                            was performed, and patient medications and                            allergies were reviewed. The patient's tolerance of                            previous anesthesia was also reviewed. The risks                            and benefits of the procedure and the sedation                            options and risks were discussed with the patient.                            All questions were answered, and informed consent                            was obtained. Prior Anticoagulants: The patient has                            taken no previous anticoagulant or antiplatelet                            agents. ASA Grade Assessment: II - A patient with                            mild systemic disease. After reviewing the risks                            and benefits, the patient was deemed in                            satisfactory condition to undergo the procedure.  After obtaining informed consent, the colonoscope                            was passed under direct vision. Throughout the                            procedure, the patient's blood pressure, pulse, and                            oxygen saturations were monitored continuously. The                            Colonoscope was introduced through the anus and                            advanced to the the cecum, identified by                             appendiceal orifice and ileocecal valve. The                            colonoscopy was performed without difficulty. The                            patient tolerated the procedure well. The quality                            of the bowel preparation was excellent. The                            ileocecal valve, appendiceal orifice, and rectum                            were photographed. Scope In: 8:01:45 AM Scope Out: 8:14:37 AM Scope Withdrawal Time: 0 hours 8 minutes 54 seconds  Total Procedure Duration: 0 hours 12 minutes 52 seconds  Findings:                 Two sessile polyps were found in the transverse                            colon and ascending colon. The polyps were 2 to 4                            mm in size. These polyps were removed with a cold                            snare. Resection and retrieval were complete.                           Many small and large-mouthed diverticula were found                            in the left colon.  The exam was otherwise without abnormality on                            direct and retroflexion views. Complications:            No immediate complications. Estimated blood loss:                            None. Estimated Blood Loss:     Estimated blood loss: none                           . Impression:               - Two 2 to 4 mm polyps in the transverse colon and                            in the ascending colon, removed with a cold snare.                            Resected and retrieved.                           - Diverticulosis in the left colon.                           - The examination was otherwise normal on direct                            and retroflexion views. Recommendation:           - Patient has a contact number available for                            emergencies. The signs and symptoms of potential                            delayed complications were  discussed with the                            patient. Return to normal activities tomorrow.                            Written discharge instructions were provided to the                            patient.                           - Resume previous diet.                           - Continue present medications.                           You will receive a letter within 2-3 weeks with the  pathology results and my final recommendations.                           If the polyp(s) is proven to be 'pre-cancerous' on                            pathology, you will need repeat colonoscopy in 5                            years. Milus Banister, MD 05/10/2016 8:17:15 AM This report has been signed electronically.

## 2016-05-11 ENCOUNTER — Telehealth: Payer: Self-pay | Admitting: *Deleted

## 2016-05-11 NOTE — Telephone Encounter (Signed)
  Follow up Call-  Call back number 05/10/2016  Post procedure Call Back phone  # [phone]  Permission to leave phone message Yes  Some recent data might be hidden     Patient questions:  Do you have a fever, pain , or abdominal swelling?no Pain Score 0  Have you tolerated food without any problems?yes  Have you been able to return to your normal activities? yes  Do you have any questions about your discharge instructions: Diet  no Medications no Follow up visit no  Do you have questions or concerns about your Care? no  Actions: * If pain score is 4 or above: :"No action needed, pain less than 4

## 2016-05-18 ENCOUNTER — Encounter: Payer: Self-pay | Admitting: Gastroenterology

## 2016-09-10 ENCOUNTER — Telehealth: Payer: Self-pay

## 2016-09-10 MED ORDER — VALSARTAN 80 MG PO TABS: 80.0000 mg | ORAL_TABLET | Freq: Every day | ORAL | 1 refills | Status: DC

## 2016-09-10 MED ORDER — AMLODIPINE BESYLATE 10 MG PO TABS: 10.0000 mg | ORAL_TABLET | Freq: Every day | ORAL | 3 refills | Status: DC

## 2016-09-10 MED ORDER — ATORVASTATIN CALCIUM 40 MG PO TABS: 40.0000 mg | ORAL_TABLET | Freq: Every day | ORAL | 3 refills | Status: DC

## 2016-09-10 NOTE — Telephone Encounter (Signed)
Fax received from [facility] to fill atorvastatin, amlodipine and valsartan LOV: 09/01/2016.   erx sent as requested.

## 2017-02-16 ENCOUNTER — Other Ambulatory Visit: Payer: Self-pay | Admitting: Internal Medicine

## 2017-04-09 NOTE — Progress Notes (Signed)
  Subjective:    Patient ID: Erin Liu, female    DOB: 06/10/1940, 77 y.o.   MRN: [id number]  HPI She is here for a physical exam.   She is here with her daughter.  She has not been seen in over a year.  She has not seen any other doctors in the past year.  She has no concerns.  Medications and allergies reviewed with patient and updated if appropriate.  Patient Active Problem List   Diagnosis Date Noted  . B12 deficiency 04/11/2017  . Left flank pain 08/30/2015  . Cough 08/26/2015  . Lumbago 02/25/2015  . Essential tremor   . Glaucoma 01/08/2010  . RENAL FAILURE, CHRONIC 12/10/2008  . DM (diabetes mellitus), secondary, uncontrolled, with renal complications (HCC) 02/22/2006  . Diabetic neuropathy (HCC) 02/22/2006  . Dyslipidemia 02/22/2006  . OBESITY 02/22/2006  . 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 CARPAL TUNNEL SYNDROME, BILATERAL 02/22/2006  . Essential hypertension 02/22/2006  . CONSTIPATION 02/22/2006    Current Outpatient Medications on File Prior to Visit  Medication Sig Dispense Refill  . Alcohol Swabs (B-D SINGLE USE SWABS REGULAR) PADS Use as directed. Dx E11.9 200 each 2  . amLODipine (NORVASC) 10 MG tablet Take 1 tablet (10 mg total) by mouth daily. 90 tablet 3  . Blood Glucose Calibration (TRUE METRIX LEVEL 1) Low SOLN Use as directed Dx. E11.9 3 each 0  . blood glucose meter kit and supplies KIT Dispense based on patient and insurance preference. Use up to four times daily as directed. (FOR ICD-9 250.00, 250.01). 1 each 0  . Blood Glucose Monitoring Suppl (TRUE METRIX AIR GLUCOSE METER) w/Device KIT 1 Device by Does not apply route daily. Use to check blood sugars twice a day. Dx E11.9 1 kit 0  . glucose blood (TRUE METRIX BLOOD GLUCOSE TEST) test strip 1 each by Other route 2 (two) times daily. Use to check blood sugars twice a day. Dx E11.9 200 each 2  . TRUEPLUS LANCETS 33G MISC Use to help check blood sugars twice a day. Dx E11.9 200 each 2   No current  facility-administered medications on file prior to visit.     Past Medical History:  Diagnosis Date  . ANEMIA, NORMOCYTIC   . BREAST CYST, HX OF   . CARPAL TUNNEL SYNDROME, BILATERAL   . DIABETES MELLITUS, TYPE II   . Glaucoma   . GLUCOMA   . HYPERLIPIDEMIA   . HYPERTENSION   . OBESITY   . PERIPHERAL NEUROPATHY   . RENAL FAILURE, CHRONIC     Past Surgical History:  Procedure Laterality Date  . CATARACT EXTRACTION BILATERAL W/ ANTERIOR VITRECTOMY  05/2010  . COLONOSCOPY     [facility]/7-8 years    Social History   Socioeconomic History  . Marital status: Widowed    Spouse name: None  . Number of children: None  . Years of education: None  . Highest education level: None  Social Needs  . Financial resource strain: None  . Food insecurity - worry: None  . Food insecurity - inability: None  . Transportation needs - medical: None  . Transportation needs - non-medical: None  Occupational History  . None  Tobacco Use  . Smoking status: Never Smoker  . Smokeless tobacco: Never Used  Substance and Sexual Activity  . Alcohol use: No  . Drug use: No  . Sexual activity: None  Other Topics Concern  . None  Social History Narrative   Widowed, lives with son.   Retired Medical illustrator    Family History  Problem Relation Age of Onset  . Arthritis Son   . Alcohol abuse Son   . Diabetes Son        parent, grandparent  . Hypertension Son        parent, grandparent and other  . Colon cancer Son 34    Review of Systems  Constitutional: Negative for chills, fatigue and fever.  Eyes: Negative for visual disturbance.  Respiratory: Negative for cough, shortness of breath and wheezing.   Cardiovascular: Negative for chest pain, palpitations and leg swelling.  Gastrointestinal: Negative for abdominal pain, blood in stool, constipation, diarrhea and nausea.  Genitourinary: Negative for dysuria and hematuria.  Musculoskeletal: Positive for back pain.  Skin: Negative for color  change and rash.  Neurological: Negative for light-headedness and headaches.  Psychiatric/Behavioral: Negative for dysphoric mood. The patient is not nervous/anxious.        No memory concerns per family       Objective:   Vitals:   04/11/17 0924  BP: 130/62  Pulse: 78  Resp: 16  Temp: 97.6 F (36.4 C)  SpO2: 98%   Filed Weights   04/11/17 0924  Weight: 218 lb (98.9 kg)   Body mass index is 36.28 kg/m.  Wt Readings from Last 3 Encounters:  04/11/17 218 lb (98.9 kg)  05/10/16 218 lb (98.9 kg)  04/26/16 218 lb (98.9 kg)     Physical Exam Constitutional: She appears well-developed and well-nourished. No distress.  HENT:  Head: Normocephalic and atraumatic.  Right Ear: External ear normal. Normal ear canal and TM Left Ear: External ear normal.  Normal ear canal and TM Mouth/Throat: Oropharynx is clear and moist.  Eyes: Conjunctivae and EOM are normal.  Neck: Neck supple. No tracheal deviation present. No thyromegaly present.  No carotid bruit  Cardiovascular: Normal rate, regular rhythm and normal heart sounds.   No murmur heard.  No edema. Pulmonary/Chest: Effort normal and breath sounds normal. No respiratory distress. She has no wheezes. She has no rales.  Breast: deferred  Abdominal: Soft. She exhibits no distension. There is no tenderness.  Lymphadenopathy: She has no cervical adenopathy.  Skin: Skin is warm and dry. She is not diaphoretic.  Psychiatric: She has a normal mood and affect. Her behavior is normal.   Diabetic Foot Exam - Simple   Simple Foot Form Diabetic Foot exam was performed with the following findings:  Yes   Visual Inspection No deformities, no ulcerations, no other skin breakdown bilaterally:  Yes See comments:  Yes Sensation Testing See comments:  Yes Pulse Check Posterior Tibialis and Dorsalis pulse intact bilaterally:  Yes Comments Slightly decreased sensation bilateral feet Long, discolored nails, dry skin         Assessment  & Plan:   Physical exam: Screening blood work ordered Immunizations  Discussed shingrix, others up to date Colonoscopy no longer needed due to age 1 no longer needed due to age 36   no longer sees GYN Dexa - will schedule -ordered Eye exams  Not to date -- will schedule Exercise  None -- stressed regular exercise Weight  Advised weight loss Skin no concerns Substance abuse      none  See Problem List for Assessment and Plan of chronic medical problems.

## 2017-04-09 NOTE — Patient Instructions (Addendum)
Call and schedule an eye appointment.   Test(s) ordered today. Your results will be released to [place] (or called to you) after review, usually within 72hours after test completion. If any changes need to be made, you will be notified at that same time.  All other Health Maintenance issues reviewed.   All recommended immunizations and age-appropriate screenings are up-to-date or discussed.  No immunizations administered today.   Medications reviewed and updated.  No changes recommended at this time.  Your prescription(s) have been submitted to your pharmacy. Please take as directed and contact our office if you believe you are having problem(s) with the medication(s).  A referral was ordered for podiatry  Please followup in 6 months   Health Maintenance, Female Adopting a healthy lifestyle and getting preventive care can go a long way to promote health and wellness. Talk with your health care provider about what schedule of regular examinations is right for you. This is a good chance for you to check in with your provider about disease prevention and staying healthy. In between checkups, there are plenty of things you can do on your own. Experts have done a lot of research about which lifestyle changes and preventive measures are most likely to keep you healthy. Ask your health care provider for more information. Weight and diet Eat a healthy diet  Be sure to include plenty of vegetables, fruits, low-fat dairy products, and lean protein.  Do not eat a lot of foods high in solid fats, added sugars, or salt.  Get regular exercise. This is one of the most important things you can do for your health. ? Most adults should exercise for at least 150 minutes each week. The exercise should increase your heart rate and make you sweat (moderate-intensity exercise). ? Most adults should also do strengthening exercises at least twice a week. This is in addition to the moderate-intensity  exercise.  Maintain a healthy weight  Body mass index (BMI) is a measurement that can be used to identify possible weight problems. It estimates body fat based on height and weight. Your health care provider can help determine your BMI and help you achieve or maintain a healthy weight.  For females 3 years of age and older: ? A BMI below 18.5 is considered underweight. ? A BMI of 18.5 to 24.9 is normal. ? A BMI of 25 to 29.9 is considered overweight. ? A BMI of 30 and above is considered obese.  Watch levels of cholesterol and blood lipids  You should start having your blood tested for lipids and cholesterol at 77 years of age, then have this test every 5 years.  You may need to have your cholesterol levels checked more often if: ? Your lipid or cholesterol levels are high. ? You are older than 78 years of age. ? You are at high risk for heart disease.  Cancer screening Lung Cancer  Lung cancer screening is recommended for adults 34-31 years old who are at high risk for lung cancer because of a history of smoking.  A yearly low-dose CT scan of the lungs is recommended for people who: ? Currently smoke. ? Have quit within the past 15 years. ? Have at least a 30-pack-year history of smoking. A pack year is smoking an average of one pack of cigarettes a day for 1 year.  Yearly screening should continue until it has been 15 years since you quit.  Yearly screening should stop if you develop a health problem that would  prevent you from having lung cancer treatment.  Breast Cancer  Practice breast self-awareness. This means understanding how your breasts normally appear and feel.  It also means doing regular breast self-exams. Let your health care provider know about any changes, no matter how small.  If you are in your 20s or 30s, you should have a clinical breast exam (CBE) by a health care provider every 1-3 years as part of a regular health exam.  If you are 82 or older, have  a CBE every year. Also consider having a breast X-ray (mammogram) every year.  If you have a family history of breast cancer, talk to your health care provider about genetic screening.  If you are at high risk for breast cancer, talk to your health care provider about having an MRI and a mammogram every year.  Breast cancer gene (BRCA) assessment is recommended for women who have family members with BRCA-related cancers. BRCA-related cancers include: ? Breast. ? Ovarian. ? Tubal. ? Peritoneal cancers.  Results of the assessment will determine the need for genetic counseling and BRCA1 and BRCA2 testing.  Cervical Cancer Your health care provider may recommend that you be screened regularly for cancer of the pelvic organs (ovaries, uterus, and vagina). This screening involves a pelvic examination, including checking for microscopic changes to the surface of your cervix (Pap test). You may be encouraged to have this screening done every 3 years, beginning at age 87.  For women ages 8-65, health care providers may recommend pelvic exams and Pap testing every 3 years, or they may recommend the Pap and pelvic exam, combined with testing for human papilloma virus (HPV), every 5 years. Some types of HPV increase your risk of cervical cancer. Testing for HPV may also be done on women of any age with unclear Pap test results.  Other health care providers may not recommend any screening for nonpregnant women who are considered low risk for pelvic cancer and who do not have symptoms. Ask your health care provider if a screening pelvic exam is right for you.  If you have had past treatment for cervical cancer or a condition that could lead to cancer, you need Pap tests and screening for cancer for at least 20 years after your treatment. If Pap tests have been discontinued, your risk factors (such as having a new sexual partner) need to be reassessed to determine if screening should resume. Some women have  medical problems that increase the chance of getting cervical cancer. In these cases, your health care provider may recommend more frequent screening and Pap tests.  Colorectal Cancer  This type of cancer can be detected and often prevented.  Routine colorectal cancer screening usually begins at 77 years of age and continues through 77 years of age.  Your health care provider may recommend screening at an earlier age if you have risk factors for colon cancer.  Your health care provider may also recommend using home test kits to check for hidden blood in the stool.  A small camera at the end of a tube can be used to examine your colon directly (sigmoidoscopy or colonoscopy). This is done to check for the earliest forms of colorectal cancer.  Routine screening usually begins at age 57.  Direct examination of the colon should be repeated every 5-10 years through 77 years of age. However, you may need to be screened more often if early forms of precancerous polyps or small growths are found.  Skin Cancer  Check your  skin from head to toe regularly.  Tell your health care provider about any new moles or changes in moles, especially if there is a change in a mole's shape or color.  Also tell your health care provider if you have a mole that is larger than the size of a pencil eraser.  Always use sunscreen. Apply sunscreen liberally and repeatedly throughout the day.  Protect yourself by wearing long sleeves, pants, a wide-brimmed hat, and sunglasses whenever you are outside.  Heart disease, diabetes, and high blood pressure  High blood pressure causes heart disease and increases the risk of stroke. High blood pressure is more likely to develop in: ? People who have blood pressure in the high end of the normal range (130-139/85-89 mm Hg). ? People who are overweight or obese. ? People who are African American.  If you are 54-36 years of age, have your blood pressure checked every 3-5  years. If you are 63 years of age or older, have your blood pressure checked every year. You should have your blood pressure measured twice-once when you are at a hospital or clinic, and once when you are not at a hospital or clinic. Record the average of the two measurements. To check your blood pressure when you are not at a hospital or clinic, you can use: ? An automated blood pressure machine at a pharmacy. ? A home blood pressure monitor.  If you are between 46 years and 46 years old, ask your health care provider if you should take aspirin to prevent strokes.  Have regular diabetes screenings. This involves taking a blood sample to check your fasting blood sugar level. ? If you are at a normal weight and have a low risk for diabetes, have this test once every three years after 77 years of age. ? If you are overweight and have a high risk for diabetes, consider being tested at a younger age or more often. Preventing infection Hepatitis B  If you have a higher risk for hepatitis B, you should be screened for this virus. You are considered at high risk for hepatitis B if: ? You were born in a country where hepatitis B is common. Ask your health care provider which countries are considered high risk. ? Your parents were born in a high-risk country, and you have not been immunized against hepatitis B (hepatitis B vaccine). ? You have HIV or AIDS. ? You use needles to inject street drugs. ? You live with someone who has hepatitis B. ? You have had sex with someone who has hepatitis B. ? You get hemodialysis treatment. ? You take certain medicines for conditions, including cancer, organ transplantation, and autoimmune conditions.  Hepatitis C  Blood testing is recommended for: ? Everyone born from [birth year] through 1965. ? Anyone with known risk factors for hepatitis C.  Sexually transmitted infections (STIs)  You should be screened for sexually transmitted infections (STIs) including  gonorrhea and chlamydia if: ? You are sexually active and are younger than 77 years of age. ? You are older than 77 years of age and your health care provider tells you that you are at risk for this type of infection. ? Your sexual activity has changed since you were last screened and you are at an increased risk for chlamydia or gonorrhea. Ask your health care provider if you are at risk.  If you do not have HIV, but are at risk, it may be recommended that you take a prescription medicine daily  to prevent HIV infection. This is called pre-exposure prophylaxis (PrEP). You are considered at risk if: ? You are sexually active and do not regularly use condoms or know the HIV status of your partner(s). ? You take drugs by injection. ? You are sexually active with a partner who has HIV.  Talk with your health care provider about whether you are at high risk of being infected with HIV. If you choose to begin PrEP, you should first be tested for HIV. You should then be tested every 3 months for as long as you are taking PrEP. Pregnancy  If you are premenopausal and you may become pregnant, ask your health care provider about preconception counseling.  If you may become pregnant, take 400 to 800 micrograms (mcg) of folic acid every day.  If you want to prevent pregnancy, talk to your health care provider about birth control (contraception). Osteoporosis and menopause  Osteoporosis is a disease in which the bones lose minerals and strength with aging. This can result in serious bone fractures. Your risk for osteoporosis can be identified using a bone density scan.  If you are 59 years of age or older, or if you are at risk for osteoporosis and fractures, ask your health care provider if you should be screened.  Ask your health care provider whether you should take a calcium or vitamin D supplement to lower your risk for osteoporosis.  Menopause may have certain physical symptoms and  risks.  Hormone replacement therapy may reduce some of these symptoms and risks. Talk to your health care provider about whether hormone replacement therapy is right for you. Follow these instructions at home:  Schedule regular health, dental, and eye exams.  Stay current with your immunizations.  Do not use any tobacco products including cigarettes, chewing tobacco, or electronic cigarettes.  If you are pregnant, do not drink alcohol.  If you are breastfeeding, limit how much and how often you drink alcohol.  Limit alcohol intake to no more than 1 drink per day for nonpregnant women. One drink equals 12 ounces of beer, 5 ounces of wine, or 1 ounces of hard liquor.  Do not use street drugs.  Do not share needles.  Ask your health care provider for help if you need support or information about quitting drugs.  Tell your health care provider if you often feel depressed.  Tell your health care provider if you have ever been abused or do not feel safe at home. This information is not intended to replace advice given to you by your health care provider. Make sure you discuss any questions you have with your health care provider. Document Released: 09/07/2010 Document Revised: 07/31/2015 Document Reviewed: 11/26/2014 Elsevier Interactive Patient Education  [employer].

## 2017-04-11 ENCOUNTER — Ambulatory Visit (INDEPENDENT_AMBULATORY_CARE_PROVIDER_SITE_OTHER): Payer: Medicare HMO | Admitting: Internal Medicine

## 2017-04-11 ENCOUNTER — Other Ambulatory Visit (INDEPENDENT_AMBULATORY_CARE_PROVIDER_SITE_OTHER): Payer: Medicare HMO

## 2017-04-11 ENCOUNTER — Encounter: Payer: Self-pay | Admitting: Internal Medicine

## 2017-04-11 ENCOUNTER — Other Ambulatory Visit: Payer: Self-pay | Admitting: Internal Medicine

## 2017-04-11 VITALS — BP 130/62 | HR 78 | Temp 97.6°F | Resp 16 | Wt 218.0 lb

## 2017-04-11 DIAGNOSIS — E1365 Other specified diabetes mellitus with hyperglycemia: Secondary | ICD-10-CM | POA: Diagnosis not present

## 2017-04-11 DIAGNOSIS — Z13820 Encounter for screening for osteoporosis: Secondary | ICD-10-CM | POA: Diagnosis not present

## 2017-04-11 DIAGNOSIS — N183 Chronic kidney disease, stage 3 unspecified: Secondary | ICD-10-CM

## 2017-04-11 DIAGNOSIS — E1329 Other specified diabetes mellitus with other diabetic kidney complication: Secondary | ICD-10-CM | POA: Diagnosis not present

## 2017-04-11 DIAGNOSIS — E538 Deficiency of other specified B group vitamins: Secondary | ICD-10-CM | POA: Diagnosis not present

## 2017-04-11 DIAGNOSIS — Z0000 Encounter for general adult medical examination without abnormal findings: Secondary | ICD-10-CM | POA: Diagnosis not present

## 2017-04-11 DIAGNOSIS — D539 Nutritional anemia, unspecified: Secondary | ICD-10-CM | POA: Diagnosis not present

## 2017-04-11 DIAGNOSIS — E2839 Other primary ovarian failure: Secondary | ICD-10-CM

## 2017-04-11 DIAGNOSIS — IMO0002 Reserved for concepts with insufficient information to code with codable children: Secondary | ICD-10-CM

## 2017-04-11 DIAGNOSIS — E785 Hyperlipidemia, unspecified: Secondary | ICD-10-CM | POA: Diagnosis not present

## 2017-04-11 DIAGNOSIS — E0843 Diabetes mellitus due to underlying condition with diabetic autonomic (poly)neuropathy: Secondary | ICD-10-CM | POA: Diagnosis not present

## 2017-04-11 DIAGNOSIS — I10 Essential (primary) hypertension: Secondary | ICD-10-CM

## 2017-04-11 DIAGNOSIS — E1140 Type 2 diabetes mellitus with diabetic neuropathy, unspecified: Secondary | ICD-10-CM | POA: Diagnosis not present

## 2017-04-11 DIAGNOSIS — E1322 Other specified diabetes mellitus with diabetic chronic kidney disease: Secondary | ICD-10-CM | POA: Diagnosis not present

## 2017-04-11 LAB — COMPREHENSIVE METABOLIC PANEL
ALT: 15 U/L (ref 0–35)
AST: 20 U/L (ref 0–37)
Albumin: 4.1 g/dL (ref 3.5–5.2)
Alkaline Phosphatase: 54 U/L (ref 39–117)
BUN: 67 mg/dL — ABNORMAL HIGH (ref 6–23)
CO2: 22 meq/L (ref 19–32)
Calcium: 9.9 mg/dL (ref 8.4–10.5)
Chloride: 107 mEq/L (ref 96–112)
Creatinine, Ser: 2.71 mg/dL — ABNORMAL HIGH (ref 0.40–1.20)
GFR: 21.88 mL/min — AB (ref >60.00)
GLUCOSE: 132 mg/dL — AB (ref 70–99)
POTASSIUM: 6.2 meq/L — AB (ref 3.5–5.1)
SODIUM: 138 meq/L (ref 135–145)
Total Bilirubin: 0.5 mg/dL (ref 0.2–1.2)
Total Protein: 7.8 g/dL (ref 6.0–8.3)

## 2017-04-11 LAB — CBC WITH DIFFERENTIAL/PLATELET
BASOS PCT: 0.5 % (ref 0.0–3.0)
Basophils Absolute: 0 10*3/uL (ref 0.0–0.1)
EOS PCT: 1.3 % (ref 0.0–5.0)
Eosinophils Absolute: 0.1 10*3/uL (ref 0.0–0.7)
HCT: 30.6 % — ABNORMAL LOW (ref 36.0–46.0)
Hemoglobin: 9.9 g/dL — ABNORMAL LOW (ref 12.0–15.0)
LYMPHS ABS: 2.5 10*3/uL (ref 0.7–4.0)
Lymphocytes Relative: 35 % (ref 12.0–46.0)
MCHC: 32.4 g/dL (ref 30.0–36.0)
MCV: 87.9 fl (ref 78.0–100.0)
MONOS PCT: 9.2 % (ref 3.0–12.0)
Monocytes Absolute: 0.7 10*3/uL (ref 0.1–1.0)
NEUTROS ABS: 3.9 10*3/uL (ref 1.4–7.7)
NEUTROS PCT: 54 % (ref 43.0–77.0)
PLATELETS: 182 10*3/uL (ref 150.0–400.0)
RBC: 3.49 Mil/uL — ABNORMAL LOW (ref 3.87–5.11)
RDW: 14.2 % (ref 11.5–15.5)
WBC: 7.2 10*3/uL (ref 4.0–10.5)

## 2017-04-11 LAB — LIPID PANEL
Cholesterol: 206 mg/dL — ABNORMAL HIGH (ref 0–200)
HDL: 68 mg/dL (ref >39.00)
LDL CALC: 120 mg/dL — AB (ref 0–99)
NONHDL: 138.14
Total CHOL/HDL Ratio: 3
Triglycerides: 91 mg/dL (ref 0.0–149.0)
VLDL: 18.2 mg/dL (ref 0.0–40.0)

## 2017-04-11 LAB — VITAMIN B12: VITAMIN B 12: 366 pg/mL (ref 211–911)

## 2017-04-11 LAB — HEMOGLOBIN A1C: HEMOGLOBIN A1C: 6.4 % (ref 4.6–6.5)

## 2017-04-11 LAB — TSH: TSH: 1.96 u[IU]/mL (ref 0.35–4.50)

## 2017-04-11 MED ORDER — ATORVASTATIN CALCIUM 40 MG PO TABS: 40.0000 mg | ORAL_TABLET | Freq: Every day | ORAL | 1 refills | Status: DC

## 2017-04-11 MED ORDER — VALSARTAN 80 MG PO TABS: 80.0000 mg | ORAL_TABLET | Freq: Every day | ORAL | 1 refills | Status: DC

## 2017-04-11 MED ORDER — CYANOCOBALAMIN 2000 MCG PO TABS: 2000.0000 ug | ORAL_TABLET | Freq: Every day | ORAL | 3 refills | Status: DC

## 2017-04-11 MED ORDER — ATENOLOL 50 MG PO TABS: 50.0000 mg | ORAL_TABLET | Freq: Every day | ORAL | 0 refills | Status: DC

## 2017-04-11 MED ORDER — ATENOLOL 50 MG PO TABS: 50.0000 mg | ORAL_TABLET | Freq: Every day | ORAL | 1 refills | Status: DC

## 2017-04-11 NOTE — Assessment & Plan Note (Signed)
Has not seen her kidney doctor in over a year Advised her to schedule  a follow-up-we will put a referral in so that her daughter was contacted who will be driving her CMP today

## 2017-04-11 NOTE — Telephone Encounter (Signed)
Call daughter with results  Potassium is high which is chronic and other blood work is all stable.  Sugars are well controlled.    Stop diovan (this can elevate the potassium)  Start atenolol again at 50 mg daily (pending)   Recheck bmp this week.

## 2017-04-11 NOTE — Telephone Encounter (Signed)
Spoke with daughter, rx sent to local and mail order pharmacy, aware to come back for blood work.

## 2017-04-11 NOTE — Assessment & Plan Note (Signed)
Blood pressure well controlled Continue current medications Has not been taking atenolol-discontinue given blood pressure at [facility] CMP

## 2017-04-11 NOTE — Telephone Encounter (Signed)
LVM with daughter, will follow-up again tomorrow.

## 2017-04-11 NOTE — Telephone Encounter (Signed)
CRITICAL VALUE STICKER  CRITICAL VALUE: Potassium 6.2  RECEIVER (on-site recipient of call): Almyra Free  DATE & TIME NOTIFIED: 2/4 11:29am  MESSENGER (representative from lab): [place]  MD NOTIFIED: [place]  TIME OF NOTIFICATION:11:30  RESPONSE:

## 2017-04-11 NOTE — Assessment & Plan Note (Signed)
Diet controlled Noncompliant with a diabetic diet Not exercising regularly Check A1c Stressed the importance of a diabetic diet and regular exercise Schedule eye exam

## 2017-04-11 NOTE — Assessment & Plan Note (Signed)
Slight decrease in sensation bilateral bottom of feet  long, Discolored nails-referred to podiatry

## 2017-04-11 NOTE — Assessment & Plan Note (Signed)
Not taking vitamin B12 We will check level

## 2017-04-11 NOTE — Assessment & Plan Note (Signed)
Anemia-likely of chronic disease/chronic kidney function disease CBC today

## 2017-04-11 NOTE — Assessment & Plan Note (Signed)
Check lipid panel, CMP Continue statin  stressed regular exercise and healthy diet

## 2017-04-14 ENCOUNTER — Other Ambulatory Visit (INDEPENDENT_AMBULATORY_CARE_PROVIDER_SITE_OTHER): Payer: Medicare HMO

## 2017-04-14 ENCOUNTER — Telehealth: Payer: Self-pay

## 2017-04-14 DIAGNOSIS — N183 Chronic kidney disease, stage 3 unspecified: Secondary | ICD-10-CM

## 2017-04-14 LAB — BASIC METABOLIC PANEL
BUN: 68 mg/dL — AB (ref 6–23)
CHLORIDE: 108 meq/L (ref 96–112)
CO2: 22 mEq/L (ref 19–32)
Calcium: 9.9 mg/dL (ref 8.4–10.5)
Creatinine, Ser: 2.7 mg/dL — ABNORMAL HIGH (ref 0.40–1.20)
GFR: 21.98 mL/min — ABNORMAL LOW (ref >60.00)
Glucose, Bld: 104 mg/dL — ABNORMAL HIGH (ref 70–99)
POTASSIUM: 6.4 meq/L — AB (ref 3.5–5.1)
Sodium: 138 mEq/L (ref 135–145)

## 2017-04-14 NOTE — Telephone Encounter (Signed)
Noted.  See result note.  

## 2017-04-14 NOTE — Telephone Encounter (Signed)
CRITICAL VALUE STICKER  CRITICAL VALUE: Potassium 6.4  RECEIVER (on-site recipient of call): [place] NOTIFIED: 4:30  MESSENGER (representative from lab): Hope  MD NOTIFIED:   TIME OF NOTIFICATION:  RESPONSE:

## 2017-04-15 ENCOUNTER — Other Ambulatory Visit (INDEPENDENT_AMBULATORY_CARE_PROVIDER_SITE_OTHER): Payer: Medicare HMO

## 2017-04-15 DIAGNOSIS — N189 Chronic kidney disease, unspecified: Secondary | ICD-10-CM | POA: Diagnosis not present

## 2017-04-15 LAB — PHOSPHORUS: Phosphorus: 4.7 mg/dL — ABNORMAL HIGH (ref 2.3–4.6)

## 2017-04-18 ENCOUNTER — Other Ambulatory Visit: Payer: Medicare HMO

## 2017-04-25 ENCOUNTER — Ambulatory Visit (INDEPENDENT_AMBULATORY_CARE_PROVIDER_SITE_OTHER)
Admission: RE | Admit: 2017-04-25 | Discharge: 2017-04-25 | Disposition: A | Discharge status: Home / Self Care | Payer: Medicare HMO | Source: Ambulatory Visit | Attending: Internal Medicine | Admitting: Internal Medicine

## 2017-04-25 DIAGNOSIS — E2839 Other primary ovarian failure: Secondary | ICD-10-CM

## 2017-04-25 DIAGNOSIS — Z13820 Encounter for screening for osteoporosis: Secondary | ICD-10-CM

## 2017-06-09 NOTE — Progress Notes (Deleted)
Subjective:    Patient ID: Erin Liu, female    DOB: 1940/12/15, 77 y.o.   MRN: [id number]  HPI The patient is here for an acute visit.  Feet pain:   Medications and allergies reviewed with patient and updated if appropriate.  Patient Active Problem List   Diagnosis Date Noted  . B12 deficiency 04/11/2017  . Left flank pain 08/30/2015  . Cough 08/26/2015  . Lumbago 02/25/2015  . Essential tremor   . Glaucoma 01/08/2010  . RENAL FAILURE, CHRONIC 12/10/2008  . DM (diabetes mellitus), secondary, uncontrolled, with renal complications ([place]) 97/35/3299  . Diabetic neuropathy ([place]) 02/22/2006  . Dyslipidemia 02/22/2006  . OBESITY 02/22/2006  . 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 CARPAL TUNNEL SYNDROME, BILATERAL 02/22/2006  . Essential hypertension 02/22/2006  . CONSTIPATION 02/22/2006    Current Outpatient Medications on File Prior to Visit  Medication Sig Dispense Refill  . Alcohol Swabs (B-D SINGLE USE SWABS REGULAR) PADS Use as directed. Dx E11.9 200 each 2  . amLODipine (NORVASC) 10 MG tablet Take 1 tablet (10 mg total) by mouth daily. 90 tablet 3  . atenolol (TENORMIN) 50 MG tablet Take 1 tablet (50 mg total) by mouth daily. 7 tablet 0  . atorvastatin (LIPITOR) 40 MG tablet Take 1 tablet (40 mg total) by mouth daily. 90 tablet 1  . Blood Glucose Calibration (TRUE METRIX LEVEL 1) Low SOLN Use as directed Dx. E11.9 3 each 0  . blood glucose meter kit and supplies KIT Dispense based on patient and insurance preference. Use up to four times daily as directed. (FOR ICD-9 250.00, 250.01). 1 each 0  . Blood Glucose Monitoring Suppl (TRUE METRIX AIR GLUCOSE METER) w/Device KIT 1 Device by Does not apply route daily. Use to check blood sugars twice a day. Dx E11.9 1 kit 0  . cyanocobalamin 2000 MCG tablet Take 1 tablet (2,000 mcg total) by mouth daily. 90 tablet 3  . glucose blood (TRUE METRIX BLOOD GLUCOSE TEST) test strip 1 each by Other route 2 (two) times daily. Use to check  blood sugars twice a day. Dx E11.9 200 each 2  . TRUEPLUS LANCETS 33G MISC Use to help check blood sugars twice a day. Dx E11.9 200 each 2   No current facility-administered medications on file prior to visit.     Past Medical History:  Diagnosis Date  . ANEMIA, NORMOCYTIC   . BREAST CYST, HX OF   . CARPAL TUNNEL SYNDROME, BILATERAL   . DIABETES MELLITUS, TYPE II   . Glaucoma   . GLUCOMA   . HYPERLIPIDEMIA   . HYPERTENSION   . OBESITY   . PERIPHERAL NEUROPATHY   . RENAL FAILURE, CHRONIC     Past Surgical History:  Procedure Laterality Date  . CATARACT EXTRACTION BILATERAL W/ ANTERIOR VITRECTOMY  05/2010  . COLONOSCOPY     [facility]/7-8 years    Social History   Socioeconomic History  . Marital status: Widowed    Spouse name: Not on file  . Number of children: Not on file  . Years of education: Not on file  . Highest education level: Not on file  Occupational History  . Not on file  Social Needs  . Financial resource strain: Not on file  . Food insecurity:    Worry: Not on file    Inability: Not on file  . Transportation needs:    Medical: Not on file    Non-medical: Not on file  Tobacco Use  . Smoking  status: Never Smoker  . Smokeless tobacco: Never Used  Substance and Sexual Activity  . Alcohol use: No  . Drug use: No  . Sexual activity: Not on file  Lifestyle  . Physical activity:    Days per week: Not on file    Minutes per session: Not on file  . Stress: Not on file  Relationships  . Social connections:    Talks on phone: Not on file    Gets together: Not on file    Attends religious service: Not on file    Active member of club or organization: Not on file    Attends meetings of clubs or organizations: Not on file    Relationship status: Not on file  Other Topics Concern  . 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 Arthritis Son   . Alcohol abuse  Son   . Diabetes Son        parent, grandparent  . Hypertension Son        parent, grandparent and other  . Colon cancer Son 72    Review of Systems     Objective:  There were no vitals filed for this visit. BP Readings from Last 3 Encounters:  04/11/17 130/62  05/10/16 129/74  08/29/15 138/60   Wt Readings from Last 3 Encounters:  04/11/17 218 lb (98.9 kg)  05/10/16 218 lb (98.9 kg)  04/26/16 218 lb (98.9 kg)   There is no height or weight on file to calculate BMI.   Physical Exam         Assessment & Plan:    See Problem List for Assessment and Plan of chronic medical problems.

## 2017-06-10 ENCOUNTER — Ambulatory Visit: Payer: Medicare HMO | Admitting: Internal Medicine

## 2017-06-11 DIAGNOSIS — R2242 Localized swelling, mass and lump, left lower limb: Secondary | ICD-10-CM | POA: Diagnosis not present

## 2017-06-11 DIAGNOSIS — M109 Gout, unspecified: Secondary | ICD-10-CM | POA: Diagnosis not present

## 2017-06-24 ENCOUNTER — Other Ambulatory Visit: Payer: Self-pay | Admitting: Internal Medicine

## 2017-07-04 ENCOUNTER — Other Ambulatory Visit: Payer: Self-pay | Admitting: Internal Medicine

## 2017-07-12 ENCOUNTER — Ambulatory Visit: Payer: Medicare HMO | Admitting: Internal Medicine

## 2017-07-12 ENCOUNTER — Encounter: Payer: Self-pay | Admitting: Internal Medicine

## 2017-07-12 VITALS — BP 126/60 | HR 67 | Temp 97.7°F | Resp 16 | Wt 211.0 lb

## 2017-07-12 DIAGNOSIS — M10372 Gout due to renal impairment, left ankle and foot: Secondary | ICD-10-CM

## 2017-07-12 DIAGNOSIS — N184 Chronic kidney disease, stage 4 (severe): Secondary | ICD-10-CM

## 2017-07-12 DIAGNOSIS — M109 Gout, unspecified: Secondary | ICD-10-CM | POA: Insufficient documentation

## 2017-07-12 DIAGNOSIS — M10371 Gout due to renal impairment, right ankle and foot: Secondary | ICD-10-CM | POA: Insufficient documentation

## 2017-07-12 MED ORDER — PREDNISONE 10 MG (21) PO TBPK: ORAL_TABLET | ORAL | 0 refills | Status: DC

## 2017-07-12 MED ORDER — ALLOPURINOL 100 MG PO TABS: 200.0000 mg | ORAL_TABLET | Freq: Every day | ORAL | 6 refills | Status: DC

## 2017-07-12 NOTE — Assessment & Plan Note (Signed)
Reviewed her kidney function with her and her great-niece again and stressed the importance of following up.  Her great niece will help her call today and make an appointment Discussed that she is very high potassium and that she has severe kidney dysfunction may need to consider dialysis Last blood work faxed to [facility] not repeat blood work today

## 2017-07-12 NOTE — Patient Instructions (Addendum)
Call up your kidney doctor and make a follow up appointment.    Start the prednisone (steroid taper) for your gout in the left foot.   Start the allopurinol daily to help prevent gout.     Call if no improvement

## 2017-07-12 NOTE — Assessment & Plan Note (Signed)
Treated at urgent care approximately 2 weeks ago with steroids and indomethacin Discussed with her that she cannot take any NSAIDs Still symptomatic so I will give her a short prednisone taper Given her kidney function she is high risk for recurrence Start allopurinol 200 mg daily

## 2017-07-12 NOTE — Progress Notes (Signed)
Subjective:    Patient ID: Erin Liu, female    DOB: 30-Nov-1940, 77 y.o.   MRN: [id number]  HPI The patient is here for follow up.  Her great niece is with her.  Gout: She was seen at urgent care recently and was diagnosed with gout.  She started having entire left foot pain.  The foot was swollen.  She denies redness.  It was warm.  She did not have any injury or accident that may have caused the pain.  Urgent care diagnosed her with gout.  She had a similar episode in that foot 2-3 years ago, but nothing since then.  They gave her a shot of steroid and it helped.  They gave her prescriptions for indomethacin and prednisone pills. She took them and finished them.  She still has some pain in the left foot.  She denies swelling.  She denies any warmth or redness.  She does feel that the medication they prescribed helped a lot, but her symptoms did not resolve completely.  Chronic kidney disease: She was here last in February.  Her kidney function is severely reduced and she has had long-standing hyperkalemia.  Before and after her blood work at that time I stressed the importance of her following up with her nephrologist who she has not seen in over a year.  She states she has not made that appointment.  Her great niece was not aware that she had chronic kidney disease, but her daughter was here at her last appointment.  Medications and allergies reviewed with patient and updated if appropriate.  Patient Active Problem List   Diagnosis Date Noted  . B12 deficiency 04/11/2017  . Left flank pain 08/30/2015  . Cough 08/26/2015  . Lumbago 02/25/2015  . Essential tremor   . Glaucoma 01/08/2010  . Chronic kidney disease (CKD), stage IV (severe) ([place]) 12/10/2008  . DM (diabetes mellitus), secondary, uncontrolled, with renal complications ([place]) 24/11/7351  . Diabetic neuropathy ([place]) 02/22/2006  . Dyslipidemia 02/22/2006  . OBESITY 02/22/2006  . 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 CARPAL  TUNNEL SYNDROME, BILATERAL 02/22/2006  . Essential hypertension 02/22/2006  . CONSTIPATION 02/22/2006    Current Outpatient Medications on File Prior to Visit  Medication Sig Dispense Refill  . Alcohol Swabs (B-D SINGLE USE SWABS REGULAR) PADS Use as directed. Dx E11.9 200 each 2  . amLODipine (NORVASC) 10 MG tablet TAKE 1 TABLET EVERY DAY 90 tablet 2  . atenolol (TENORMIN) 50 MG tablet Take 1 tablet (50 mg total) by mouth daily. 7 tablet 0  . atorvastatin (LIPITOR) 40 MG tablet TAKE 1 TABLET EVERY DAY 90 tablet 2  . Blood Glucose Calibration (TRUE METRIX LEVEL 1) Low SOLN Use as directed Dx. E11.9 3 each 0  . blood glucose meter kit and supplies KIT Dispense based on patient and insurance preference. Use up to four times daily as directed. (FOR ICD-9 250.00, 250.01). 1 each 0  . Blood Glucose Monitoring Suppl (TRUE METRIX AIR GLUCOSE METER) w/Device KIT 1 Device by Does not apply route daily. Use to check blood sugars twice a day. Dx E11.9 1 kit 0  . cyanocobalamin 2000 MCG tablet Take 1 tablet (2,000 mcg total) by mouth daily. 90 tablet 3  . glucose blood (TRUE METRIX BLOOD GLUCOSE TEST) test strip 1 each by Other route 2 (two) times daily. Use to check blood sugars twice a day. Dx E11.9 200 each 2  . TRUEPLUS LANCETS 33G MISC Use to help check blood  sugars twice a day. Dx E11.9 200 each 2   No current facility-administered medications on file prior to visit.     Past Medical History:  Diagnosis Date  . ANEMIA, NORMOCYTIC   . BREAST CYST, HX OF   . CARPAL TUNNEL SYNDROME, BILATERAL   . DIABETES MELLITUS, TYPE II   . Glaucoma   . GLUCOMA   . HYPERLIPIDEMIA   . HYPERTENSION   . OBESITY   . PERIPHERAL NEUROPATHY   . RENAL FAILURE, CHRONIC     Past Surgical History:  Procedure Laterality Date  . CATARACT EXTRACTION BILATERAL W/ ANTERIOR VITRECTOMY  05/2010  . COLONOSCOPY     [facility]/7-8 years    Social History   Socioeconomic History  . Marital status: Widowed    Spouse  name: Not on file  . Number of children: Not on file  . Years of education: Not on file  . Highest education level: Not on file  Occupational History  . Not on file  Social Needs  . Financial resource strain: Not on file  . Food insecurity:    Worry: Not on file    Inability: Not on file  . Transportation needs:    Medical: Not on file    Non-medical: Not on file  Tobacco Use  . Smoking status: Never Smoker  . Smokeless tobacco: Never Used  Substance and Sexual Activity  . Alcohol use: No  . Drug use: No  . Sexual activity: Not on file  Lifestyle  . Physical activity:    Days per week: Not on file    Minutes per session: Not on file  . Stress: Not on file  Relationships  . Social connections:    Talks on phone: Not on file    Gets together: Not on file    Attends religious service: Not on file    Active member of club or organization: Not on file    Attends meetings of clubs or organizations: Not on file    Relationship status: Not on file  Other Topics Concern  . 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 Arthritis Son   . Alcohol abuse Son   . Diabetes Son        parent, grandparent  . Hypertension Son        parent, grandparent and other  . Colon cancer Son 51    Review of Systems  Constitutional: Negative for chills and fever.  Musculoskeletal:       Pain left foot  Skin: Negative for color change, rash and wound.       Objective:   Vitals:   07/12/17 1522  BP: 126/60  Pulse: 67  Resp: 16  Temp: 97.7 F (36.5 C)  SpO2: 96%   BP Readings from Last 3 Encounters:  07/12/17 126/60  04/11/17 130/62  05/10/16 129/74   Wt Readings from Last 3 Encounters:  07/12/17 211 lb (95.7 kg)  04/11/17 218 lb (98.9 kg)  05/10/16 218 lb (98.9 kg)   Body mass index is 35.11 kg/m.   Physical Exam  Constitutional: She appears well-developed and  well-nourished. No distress.  Musculoskeletal:  Mild swelling dorsal aspect of left proximal foot that is tender to palpation.  No ankle or MTP joint swelling or tenderness.  No pain with movement of ankle or toes.  Tenderness with palpation of dorsal aspect of foot  Skin: She is not diaphoretic. No erythema.  Left foot skin dry and scaly, no area of erythema or warmth           Assessment & Plan:    See Problem List for Assessment and Plan of chronic medical problems.

## 2017-07-14 ENCOUNTER — Telehealth: Payer: Self-pay | Admitting: Emergency Medicine

## 2017-07-14 NOTE — Telephone Encounter (Signed)
Lab faxed. Spoke with pt's daughter to inform.

## 2017-07-14 NOTE — Telephone Encounter (Signed)
Copied from [place] [id number]. Topic: Referral - Question >> Jul 14, 2017  1:01 PM Wynetta Emery, [place] C wrote: Reason for CRM: pt's daughter says that pt has a referral to [facility]. They need a copy of pt's lab work so that they can see her. Daughter didn't have fax number to provide.

## 2017-07-26 ENCOUNTER — Telehealth: Payer: Self-pay | Admitting: Internal Medicine

## 2017-07-26 DIAGNOSIS — N2581 Secondary hyperparathyroidism of renal origin: Secondary | ICD-10-CM | POA: Diagnosis not present

## 2017-07-26 DIAGNOSIS — D631 Anemia in chronic kidney disease: Secondary | ICD-10-CM | POA: Diagnosis not present

## 2017-07-26 DIAGNOSIS — N189 Chronic kidney disease, unspecified: Secondary | ICD-10-CM | POA: Diagnosis not present

## 2017-07-26 DIAGNOSIS — N184 Chronic kidney disease, stage 4 (severe): Secondary | ICD-10-CM | POA: Diagnosis not present

## 2017-07-26 DIAGNOSIS — I129 Hypertensive chronic kidney disease with stage 1 through stage 4 chronic kidney disease, or unspecified chronic kidney disease: Secondary | ICD-10-CM | POA: Diagnosis not present

## 2017-07-26 NOTE — Telephone Encounter (Signed)
Copied from [place] [id number]. Topic: Quick Communication - See Telephone Encounter >> Jul 26, 2017  8:21 AM Robina Ade, Helene Kelp D wrote: CRM for notification. See Telephone encounter for: 07/26/17. Patient daughter called and would like to talk to Dr. Quay Burow or her CMA about patients medications which she is supposed to be taking and what she is not. Please call her back, thanks.

## 2017-07-27 NOTE — Telephone Encounter (Signed)
Tried contacting pts daughter, unable to LVM due to mail box being full.

## 2017-07-27 NOTE — Telephone Encounter (Signed)
Spoke with pt's daughter to clarify medications.

## 2017-07-27 NOTE — Telephone Encounter (Signed)
Desmond Lope (EC) [phone]   [name] calling to follow up on request for call back. Advised msg was routed to the [place].

## 2017-08-17 ENCOUNTER — Telehealth: Payer: Self-pay | Admitting: Internal Medicine

## 2017-08-17 NOTE — Telephone Encounter (Signed)
Pt daughter would like a call back reagrding her medications, she would like to know what the patient is supposed to be taking. States she is supposed to be off of her lipitor per dr. Quay Burow. And prednisone is suppsoed to be off of as well.

## 2017-08-18 NOTE — Addendum Note (Signed)
Addended by: Binnie Rail on: 08/18/2017 02:06 PM   Modules accepted: Orders

## 2017-08-18 NOTE — Telephone Encounter (Signed)
Spoke with pt's daughter. States she was under the impression that the Atorvastatin was d/ced but they are still receiving it through mail order. She also states that diabetic med was d/ced but pts Kidney dr says shes needs to be put back on diabetes med due to decreased kidney function.

## 2017-08-18 NOTE — Telephone Encounter (Signed)
She should still be taking the atorvastatin.  I did not discontinue that medication.  She is not on any diabetic medication and has not been on any medication for the diabetes and at least a couple of years.  Her last A1c in February was well controlled at 6.4% so she does not need medication.  If we did start medication it would have to be insulin and since her A1c is well controlled she does not need this now.  I do not see anything in the kidney doctors note suggesting medication for her sugars.

## 2017-08-18 NOTE — Telephone Encounter (Signed)
LVM for pts daughter to call back and discuss.

## 2017-08-19 NOTE — Telephone Encounter (Signed)
LVM with pts daughter to inform.

## 2017-09-07 ENCOUNTER — Other Ambulatory Visit: Payer: Self-pay | Admitting: Internal Medicine

## 2017-09-07 NOTE — Telephone Encounter (Signed)
Patient is out of bp medicine. Please call once sent to pharmacy.

## 2017-09-09 DIAGNOSIS — N189 Chronic kidney disease, unspecified: Secondary | ICD-10-CM | POA: Diagnosis not present

## 2017-09-09 NOTE — Telephone Encounter (Signed)
Pt daughter Desmond Lope [phone] calling back about RX Atenolol 50 MG  Pt has been out of this medicine since the 3rd of July

## 2017-09-11 ENCOUNTER — Other Ambulatory Visit: Payer: Self-pay | Admitting: Internal Medicine

## 2017-09-12 NOTE — Telephone Encounter (Signed)
Reviewed chart pt is up-to-date sent refills to [facility].Marland KitchenJohny Chess

## 2017-09-29 ENCOUNTER — Other Ambulatory Visit (HOSPITAL_COMMUNITY): Payer: Self-pay

## 2017-09-30 ENCOUNTER — Inpatient Hospital Stay (HOSPITAL_COMMUNITY): Admission: RE | Admit: 2017-09-30 | Payer: Medicare HMO | Source: Ambulatory Visit

## 2017-10-02 ENCOUNTER — Observation Stay (HOSPITAL_COMMUNITY): Payer: Medicare HMO

## 2017-10-02 ENCOUNTER — Emergency Department (HOSPITAL_COMMUNITY): Payer: Medicare HMO

## 2017-10-02 ENCOUNTER — Encounter (HOSPITAL_COMMUNITY): Payer: Self-pay

## 2017-10-02 ENCOUNTER — Observation Stay (HOSPITAL_COMMUNITY)
Admission: EM | Admit: 2017-10-02 | Discharge: 2017-10-04 | Disposition: A | Discharge status: Home Health | Payer: Medicare HMO | Attending: Internal Medicine | Admitting: Internal Medicine

## 2017-10-02 ENCOUNTER — Other Ambulatory Visit: Payer: Self-pay

## 2017-10-02 DIAGNOSIS — R52 Pain, unspecified: Secondary | ICD-10-CM

## 2017-10-02 DIAGNOSIS — I130 Hypertensive heart and chronic kidney disease with heart failure and stage 1 through stage 4 chronic kidney disease, or unspecified chronic kidney disease: Secondary | ICD-10-CM | POA: Diagnosis not present

## 2017-10-02 DIAGNOSIS — E1122 Type 2 diabetes mellitus with diabetic chronic kidney disease: Secondary | ICD-10-CM | POA: Diagnosis not present

## 2017-10-02 DIAGNOSIS — E669 Obesity, unspecified: Secondary | ICD-10-CM | POA: Insufficient documentation

## 2017-10-02 DIAGNOSIS — N184 Chronic kidney disease, stage 4 (severe): Secondary | ICD-10-CM | POA: Insufficient documentation

## 2017-10-02 DIAGNOSIS — N179 Acute kidney failure, unspecified: Secondary | ICD-10-CM | POA: Insufficient documentation

## 2017-10-02 DIAGNOSIS — Z8261 Family history of arthritis: Secondary | ICD-10-CM | POA: Insufficient documentation

## 2017-10-02 DIAGNOSIS — Z833 Family history of diabetes mellitus: Secondary | ICD-10-CM | POA: Insufficient documentation

## 2017-10-02 DIAGNOSIS — E785 Hyperlipidemia, unspecified: Secondary | ICD-10-CM | POA: Insufficient documentation

## 2017-10-02 DIAGNOSIS — Z9841 Cataract extraction status, right eye: Secondary | ICD-10-CM | POA: Insufficient documentation

## 2017-10-02 DIAGNOSIS — Z9889 Other specified postprocedural states: Secondary | ICD-10-CM | POA: Insufficient documentation

## 2017-10-02 DIAGNOSIS — G250 Essential tremor: Secondary | ICD-10-CM | POA: Diagnosis not present

## 2017-10-02 DIAGNOSIS — I081 Rheumatic disorders of both mitral and tricuspid valves: Secondary | ICD-10-CM | POA: Insufficient documentation

## 2017-10-02 DIAGNOSIS — Z87891 Personal history of nicotine dependence: Secondary | ICD-10-CM | POA: Diagnosis not present

## 2017-10-02 DIAGNOSIS — H409 Unspecified glaucoma: Secondary | ICD-10-CM | POA: Diagnosis not present

## 2017-10-02 DIAGNOSIS — I214 Non-ST elevation (NSTEMI) myocardial infarction: Secondary | ICD-10-CM | POA: Diagnosis not present

## 2017-10-02 DIAGNOSIS — I959 Hypotension, unspecified: Secondary | ICD-10-CM | POA: Diagnosis not present

## 2017-10-02 DIAGNOSIS — E876 Hypokalemia: Secondary | ICD-10-CM | POA: Diagnosis not present

## 2017-10-02 DIAGNOSIS — Z9842 Cataract extraction status, left eye: Secondary | ICD-10-CM | POA: Insufficient documentation

## 2017-10-02 DIAGNOSIS — Z6833 Body mass index (BMI) 33.0-33.9, adult: Secondary | ICD-10-CM | POA: Insufficient documentation

## 2017-10-02 DIAGNOSIS — D539 Nutritional anemia, unspecified: Secondary | ICD-10-CM | POA: Insufficient documentation

## 2017-10-02 DIAGNOSIS — IMO0002 Reserved for concepts with insufficient information to code with codable children: Secondary | ICD-10-CM

## 2017-10-02 DIAGNOSIS — E538 Deficiency of other specified B group vitamins: Secondary | ICD-10-CM | POA: Diagnosis not present

## 2017-10-02 DIAGNOSIS — R001 Bradycardia, unspecified: Secondary | ICD-10-CM | POA: Diagnosis not present

## 2017-10-02 DIAGNOSIS — Z79899 Other long term (current) drug therapy: Secondary | ICD-10-CM | POA: Insufficient documentation

## 2017-10-02 DIAGNOSIS — M19071 Primary osteoarthritis, right ankle and foot: Secondary | ICD-10-CM | POA: Diagnosis not present

## 2017-10-02 DIAGNOSIS — I447 Left bundle-branch block, unspecified: Secondary | ICD-10-CM | POA: Diagnosis not present

## 2017-10-02 DIAGNOSIS — R569 Unspecified convulsions: Secondary | ICD-10-CM | POA: Insufficient documentation

## 2017-10-02 DIAGNOSIS — G319 Degenerative disease of nervous system, unspecified: Secondary | ICD-10-CM | POA: Insufficient documentation

## 2017-10-02 DIAGNOSIS — M109 Gout, unspecified: Secondary | ICD-10-CM | POA: Diagnosis not present

## 2017-10-02 DIAGNOSIS — G5603 Carpal tunnel syndrome, bilateral upper limbs: Secondary | ICD-10-CM | POA: Diagnosis not present

## 2017-10-02 DIAGNOSIS — I6782 Cerebral ischemia: Secondary | ICD-10-CM | POA: Diagnosis not present

## 2017-10-02 DIAGNOSIS — E1151 Type 2 diabetes mellitus with diabetic peripheral angiopathy without gangrene: Secondary | ICD-10-CM | POA: Insufficient documentation

## 2017-10-02 DIAGNOSIS — R0902 Hypoxemia: Secondary | ICD-10-CM | POA: Diagnosis not present

## 2017-10-02 DIAGNOSIS — Z8249 Family history of ischemic heart disease and other diseases of the circulatory system: Secondary | ICD-10-CM | POA: Insufficient documentation

## 2017-10-02 DIAGNOSIS — E1365 Other specified diabetes mellitus with hyperglycemia: Secondary | ICD-10-CM

## 2017-10-02 DIAGNOSIS — E875 Hyperkalemia: Secondary | ICD-10-CM | POA: Insufficient documentation

## 2017-10-02 DIAGNOSIS — R55 Syncope and collapse: Secondary | ICD-10-CM | POA: Diagnosis not present

## 2017-10-02 DIAGNOSIS — I5030 Unspecified diastolic (congestive) heart failure: Secondary | ICD-10-CM | POA: Insufficient documentation

## 2017-10-02 DIAGNOSIS — E1140 Type 2 diabetes mellitus with diabetic neuropathy, unspecified: Secondary | ICD-10-CM | POA: Insufficient documentation

## 2017-10-02 DIAGNOSIS — Z794 Long term (current) use of insulin: Secondary | ICD-10-CM | POA: Insufficient documentation

## 2017-10-02 DIAGNOSIS — E1329 Other specified diabetes mellitus with other diabetic kidney complication: Secondary | ICD-10-CM

## 2017-10-02 DIAGNOSIS — Z888 Allergy status to other drugs, medicaments and biological substances status: Secondary | ICD-10-CM | POA: Insufficient documentation

## 2017-10-02 LAB — CBC
HCT: 25.7 % — ABNORMAL LOW (ref 36.0–46.0)
Hemoglobin: 8.1 g/dL — ABNORMAL LOW (ref 12.0–15.0)
MCH: 28.3 pg (ref 26.0–34.0)
MCHC: 31.5 g/dL (ref 30.0–36.0)
MCV: 89.9 fL (ref 78.0–100.0)
PLATELETS: 252 10*3/uL (ref 150–400)
RBC: 2.86 MIL/uL — ABNORMAL LOW (ref 3.87–5.11)
RDW: 13.5 % (ref 11.5–15.5)
WBC: 8.6 10*3/uL (ref 4.0–10.5)

## 2017-10-02 LAB — I-STAT CHEM 8, ED
BUN: 29 mg/dL — ABNORMAL HIGH (ref 8–23)
Calcium, Ion: 1.12 mmol/L — ABNORMAL LOW (ref 1.15–1.40)
Chloride: 97 mmol/L — ABNORMAL LOW (ref 98–111)
Creatinine, Ser: 2.2 mg/dL — ABNORMAL HIGH (ref 0.44–1.00)
Glucose, Bld: 188 mg/dL — ABNORMAL HIGH (ref 70–99)
HEMATOCRIT: 25 % — AB (ref 36.0–46.0)
HEMOGLOBIN: 8.5 g/dL — AB (ref 12.0–15.0)
POTASSIUM: 2.8 mmol/L — AB (ref 3.5–5.1)
SODIUM: 137 mmol/L (ref 135–145)
TCO2: 26 mmol/L (ref 22–32)

## 2017-10-02 LAB — BASIC METABOLIC PANEL
Anion gap: 12 (ref 5–15)
BUN: 29 mg/dL — AB (ref 8–23)
CALCIUM: 9.2 mg/dL (ref 8.9–10.3)
CO2: 29 mmol/L (ref 22–32)
CREATININE: 2.28 mg/dL — AB (ref 0.44–1.00)
Chloride: 98 mmol/L (ref 98–111)
GFR, EST AFRICAN AMERICAN: 23 mL/min — AB (ref >60)
GFR, EST NON AFRICAN AMERICAN: 20 mL/min — AB (ref >60)
Glucose, Bld: 163 mg/dL — ABNORMAL HIGH (ref 70–99)
Potassium: 2.2 mmol/L — CL (ref 3.5–5.1)
Sodium: 139 mmol/L (ref 135–145)

## 2017-10-02 LAB — IRON AND TIBC
IRON: 18 ug/dL — AB (ref 28–170)
SATURATION RATIOS: 9 % — AB (ref 10.4–31.8)
TIBC: 193 ug/dL — ABNORMAL LOW (ref 250–450)
UIBC: 175 ug/dL

## 2017-10-02 LAB — GLUCOSE, CAPILLARY: GLUCOSE-CAPILLARY: 302 mg/dL — AB (ref 70–99)

## 2017-10-02 LAB — TROPONIN I: Troponin I: 0.05 ng/mL (ref <0.03)

## 2017-10-02 LAB — URINALYSIS, ROUTINE W REFLEX MICROSCOPIC
Bilirubin Urine: NEGATIVE
GLUCOSE, UA: NEGATIVE mg/dL
Ketones, ur: NEGATIVE mg/dL
NITRITE: NEGATIVE
PROTEIN: 30 mg/dL — AB
SPECIFIC GRAVITY, URINE: 1.011 (ref 1.005–1.030)
pH: 6 (ref 5.0–8.0)

## 2017-10-02 LAB — HEMOGLOBIN A1C
Hgb A1c MFr Bld: 6.9 % — ABNORMAL HIGH (ref 4.8–5.6)
Mean Plasma Glucose: 151.33 mg/dL

## 2017-10-02 LAB — FERRITIN: Ferritin: 405 ng/mL — ABNORMAL HIGH (ref 11–307)

## 2017-10-02 LAB — RETICULOCYTES
RBC.: 3.1 MIL/uL — AB (ref 3.87–5.11)
RETIC CT PCT: 1 % (ref 0.4–3.1)
Retic Count, Absolute: 31 10*3/uL (ref 19.0–186.0)

## 2017-10-02 LAB — CBG MONITORING, ED: Glucose-Capillary: 151 mg/dL — ABNORMAL HIGH (ref 70–99)

## 2017-10-02 LAB — POTASSIUM: POTASSIUM: 2.8 mmol/L — AB (ref 3.5–5.1)

## 2017-10-02 MED ORDER — ASPIRIN EC 81 MG PO TBEC
81.0000 mg | DELAYED_RELEASE_TABLET | Freq: Every day | ORAL | Status: DC
  Administered 2017-10-02 – 2017-10-04 (×3): 81 mg via ORAL
  Filled 2017-10-02 (×3): qty 1

## 2017-10-02 MED ORDER — VITAMIN B-12 1000 MCG PO TABS: 2000.0000 ug | ORAL_TABLET | Freq: Every day | ORAL | Status: DC

## 2017-10-02 MED ORDER — ATENOLOL 50 MG PO TABS
50.0000 mg | ORAL_TABLET | Freq: Every day | ORAL | Status: DC
  Administered 2017-10-03 – 2017-10-04 (×2): 50 mg via ORAL
  Filled 2017-10-02 (×2): qty 1

## 2017-10-02 MED ORDER — ATORVASTATIN CALCIUM 40 MG PO TABS
40.0000 mg | ORAL_TABLET | Freq: Every day | ORAL | Status: DC
  Administered 2017-10-03: 40 mg via ORAL
  Filled 2017-10-02: qty 1

## 2017-10-02 MED ORDER — POTASSIUM CHLORIDE 10 MEQ/100ML IV SOLN
10.0000 meq | INTRAVENOUS | Status: AC
  Administered 2017-10-03 (×3): 10 meq via INTRAVENOUS
  Filled 2017-10-02 (×3): qty 100

## 2017-10-02 MED ORDER — ALLOPURINOL 100 MG PO TABS
200.0000 mg | ORAL_TABLET | Freq: Every day | ORAL | Status: DC
  Administered 2017-10-03 – 2017-10-04 (×2): 200 mg via ORAL
  Filled 2017-10-02 (×2): qty 2

## 2017-10-02 MED ORDER — POTASSIUM CHLORIDE CRYS ER 20 MEQ PO TBCR
40.0000 meq | EXTENDED_RELEASE_TABLET | Freq: Once | ORAL | Status: AC
  Administered 2017-10-03: 40 meq via ORAL
  Filled 2017-10-02: qty 2

## 2017-10-02 MED ORDER — INSULIN ASPART 100 UNIT/ML ~~LOC~~ SOLN
0.0000 [IU] | Freq: Three times a day (TID) | SUBCUTANEOUS | Status: DC
  Administered 2017-10-03: 5 [IU] via SUBCUTANEOUS
  Administered 2017-10-03: 15 [IU] via SUBCUTANEOUS
  Administered 2017-10-03 – 2017-10-04 (×3): 3 [IU] via SUBCUTANEOUS

## 2017-10-02 MED ORDER — POTASSIUM CHLORIDE 10 MEQ/100ML IV SOLN
10.0000 meq | INTRAVENOUS | Status: AC
  Administered 2017-10-02 (×3): 10 meq via INTRAVENOUS
  Filled 2017-10-02 (×3): qty 100

## 2017-10-02 MED ORDER — ENOXAPARIN SODIUM 30 MG/0.3ML ~~LOC~~ SOLN
30.0000 mg | SUBCUTANEOUS | Status: DC
  Administered 2017-10-02 – 2017-10-03 (×2): 30 mg via SUBCUTANEOUS
  Filled 2017-10-02 (×2): qty 0.3

## 2017-10-02 MED ORDER — POTASSIUM CHLORIDE CRYS ER 20 MEQ PO TBCR
40.0000 meq | EXTENDED_RELEASE_TABLET | Freq: Once | ORAL | Status: AC
  Administered 2017-10-02: 40 meq via ORAL
  Filled 2017-10-02: qty 2

## 2017-10-02 MED ORDER — PREDNISONE 20 MG PO TABS
40.0000 mg | ORAL_TABLET | Freq: Every day | ORAL | Status: DC
  Administered 2017-10-03: 40 mg via ORAL
  Filled 2017-10-02: qty 2

## 2017-10-02 NOTE — ED Notes (Signed)
Per lab- able to add on troponin

## 2017-10-02 NOTE — H&P (Addendum)
History and Physical    DOA: 10/02/2017  PCP: Binnie Rail, MD  Patient coming from: home  Chief Complaint: syncope  HPI: Erin Liu is a 77 y.o. female with history h/o diabetes, hypertension, hyperlipidemia, gout and chronic kidney disease presented after a syncopal episode.  Patient has gouty flares time to time and when she has a flare she has severe pain usually in her left foot and uses Rollator with seat during the episodes.  Last Sunday patient had a gouty flare in the right foot which is unusual for her, family tried conservative measures including over-the-counter topical creams, soaking the foot in Epsom salts with no relief.  Patient's pain worsened to the point that she is been bedbound and unable to bear weight.  Daughter got a wheelchair and took her to urgent care this morning where she was prescribed prednisone.  Patient took 40 mg of prednisone on the way back home.  She was helped into the house by son and daughter.  She was sitting on the Rollator seat with family around her when she was noted to be shaking in upper extremities and fell backwards losing consciousness for few minutes.  Patient denies any prodrome of sweating palpitations chest pain prior to the episode.  She does recall hurting in her foot at the time.  No report of incontinence, typical tonic-clonic movements or tongue bite.  No report of prior dizzy spells over the last week.  She denies any new medication changes except for prednisone that was prescribed today and Lokelma  prescribed by her nephrologist in May for hyperkalemia.She is compliant with her daily dose of allopurinol.  W/U in the ED revealed hypokalemia on labs, creatinine at her baseline and anemia with Hgb 8 and mildly elevated troponin at 0.05 in setting of CKD. Hospitalist service requested to admit for further evaluation and management of syncope.     Review of Systems: As per HPI otherwise 10 point review of systems negative.    Past  Medical History:  Diagnosis Date  . ANEMIA, NORMOCYTIC   . BREAST CYST, HX OF   . CARPAL TUNNEL SYNDROME, BILATERAL   . DIABETES MELLITUS, TYPE II   . Glaucoma   . GLUCOMA   . HYPERLIPIDEMIA   . HYPERTENSION   . OBESITY   . PERIPHERAL NEUROPATHY   . RENAL FAILURE, CHRONIC     Past Surgical History:  Procedure Laterality Date  . CATARACT EXTRACTION BILATERAL W/ ANTERIOR VITRECTOMY  05/2010  . COLONOSCOPY     [facility]/7-8 years    Social history: Remote history of smoking.  Quit many years back She has never used smokeless tobacco. She reports that she does not drink alcohol or use drugs.   Allergies  Allergen Reactions  . Lisinopril     REACTION: cough    Family History  Problem Relation Age of Onset  . Arthritis Son   . Alcohol abuse Son   . Diabetes Son        parent, grandparent  . Hypertension Son        parent, grandparent and other  . Colon cancer Son 57      Prior to Admission medications   Medication Sig Start Date End Date Taking? Authorizing Provider  allopurinol (ZYLOPRIM) 100 MG tablet Take 2 tablets (200 mg total) by mouth daily. 07/12/17  Yes Burns, Claudina Lick, MD  amLODipine (NORVASC) 10 MG tablet TAKE 1 TABLET EVERY DAY 07/04/17  Yes Burns, Claudina Lick, MD  atenolol (TENORMIN)  50 MG tablet TAKE 1 TABLET BY MOUTH ONCE DAILY 09/12/17  Yes Burns, Claudina Lick, MD  atorvastatin (LIPITOR) 40 MG tablet TAKE 1 TABLET EVERY DAY 07/04/17  Yes Burns, Claudina Lick, MD  LOKELMA 10 g PACK packet Take 10 g by mouth daily. 09/12/17  Yes [provider]  predniSONE (DELTASONE) 10 MG tablet Take 10-40 mg by mouth See admin instructions. Day 1-2 take 34m, day 3-4 take 368m day 5-6 take 202mday 7-8 take 61m63ml finished then stop. 10/02/17  Yes [provider]  Alcohol Swabs (B-D SINGLE USE SWABS REGULAR) PADS Use as directed. Dx E11.9 04/08/16   BurnBinnie Rail  Blood Glucose Calibration (TRUE METRIX LEVEL 1) Low SOLN Use as directed Dx. E11.9 04/08/16   BurnBinnie Rail    blood glucose meter kit and supplies KIT Dispense based on patient and insurance preference. Use up to four times daily as directed. (FOR ICD-9 250.00, 250.01). 08/29/15   BurnBinnie Rail  Blood Glucose Monitoring Suppl (TRUE METRIX AIR GLUCOSE METER) w/Device KIT 1 Device by Does not apply route daily. Use to check blood sugars twice a day. Dx E11.9 04/08/16   BurnBinnie Rail  cyanocobalamin 2000 MCG tablet Take 1 tablet (2,000 mcg total) by mouth daily. Patient not taking: Reported on 10/02/2017 04/11/17   BurnBinnie Rail  glucose blood (TRUE METRIX BLOOD GLUCOSE TEST) test strip 1 each by Other route 2 (two) times daily. Use to check blood sugars twice a day. Dx E11.9 04/08/16   BurnBinnie Rail  TRUEPLUS LANCETS 33G MISC Use to help check blood sugars twice a day. Dx E11.9 04/08/16   BurnBinnie Rail    Physical Exam: Vitals:   10/02/17 1700 10/02/17 1730 10/02/17 1800 10/02/17 1900  BP: (!) 130/96 122/79 133/66 114/77  Pulse: 76 73 73 75  Resp: (!) 24 20 (!) 21 17  SpO2: 96% 97% 97% 98%  Weight:      Height:        Constitutional: NAD, calm, comfortable Vitals:   10/02/17 1700 10/02/17 1730 10/02/17 1800 10/02/17 1900  BP: (!) 130/96 122/79 133/66 114/77  Pulse: 76 73 73 75  Resp: (!) 24 20 (!) 21 17  SpO2: 96% 97% 97% 98%  Weight:      Height:       Eyes: PERRL, lids and conjunctivae normal ENMT: Mucous membranes are moist. Posterior pharynx clear of any exudate or lesions.Normal dentition.  Neck: normal, supple, no masses, no thyromegaly Respiratory: clear to auscultation bilaterally, no wheezing, no crackles. Normal respiratory effort. No accessory muscle use.  Cardiovascular: Regular rate and rhythm, no murmurs / rubs / gallops. No extremity edema. 2+ pedal pulses. No carotid bruits.  Abdomen: no tenderness, no masses palpated. No hepatosplenomegaly. Bowel sounds positive.  Musculoskeletal: Swelling along the dorsum of her right foot with tenderness to palpation and  decreased range of motion on flexion. Neurologic: CN 2-12 grossly intact. Sensation intact, DTR normal. Strength 5/5 in all 4.  Psychiatric: Normal judgment and insight. Alert and oriented x 3. Normal mood.  SKIN/catheters: no rashes, lesions, ulcers. No induration  Labs on Admission: I have personally reviewed following labs and imaging studies  CBC: Recent Labs  Lab 10/02/17 1433  WBC 8.6  HGB 8.1*  HCT 25.7*  MCV 89.9  PLT 252 950asic Metabolic Panel: Recent Labs  Lab 10/02/17 1433  NA 139  K 2.2*  CL 98  CO2 29  GLUCOSE 163*  BUN 29*  CREATININE 2.28*  CALCIUM 9.2   GFR: Estimated Creatinine Clearance: 24.2 mL/min (A) (by C-G formula based on SCr of 2.28 mg/dL (H)). Liver Function Tests: No results for input(s): AST, ALT, ALKPHOS, BILITOT, PROT, ALBUMIN in the last 168 hours. No results for input(s): LIPASE, AMYLASE in the last 168 hours. No results for input(s): AMMONIA in the last 168 hours. Coagulation Profile: No results for input(s): INR, PROTIME in the last 168 hours. Cardiac Enzymes: Recent Labs  Lab 10/02/17 1433  TROPONINI 0.05*   BNP (last 3 results) No results for input(s): PROBNP in the last 8760 hours. HbA1C: No results for input(s): HGBA1C in the last 72 hours. CBG: Recent Labs  Lab 10/02/17 1436  GLUCAP 151*   Lipid Profile: No results for input(s): CHOL, HDL, LDLCALC, TRIG, CHOLHDL, LDLDIRECT in the last 72 hours. Thyroid Function Tests: No results for input(s): TSH, T4TOTAL, FREET4, T3FREE, THYROIDAB in the last 72 hours. Anemia Panel: No results for input(s): VITAMINB12, FOLATE, FERRITIN, TIBC, IRON, RETICCTPCT in the last 72 hours. Urine analysis:    Component Value Date/Time   COLORURINE YELLOW 10/02/2017 1718   APPEARANCEUR CLOUDY (A) 10/02/2017 1718   LABSPEC 1.011 10/02/2017 1718   PHURINE 6.0 10/02/2017 1718   GLUCOSEU NEGATIVE 10/02/2017 1718   GLUCOSEU NEGATIVE 08/26/2015 1633   HGBUR SMALL (A) 10/02/2017 1718   HGBUR  moderate 06/16/2009 0928   BILIRUBINUR NEGATIVE 10/02/2017 1718   KETONESUR NEGATIVE 10/02/2017 1718   PROTEINUR 30 (A) 10/02/2017 1718   UROBILINOGEN 0.2 08/26/2015 1633   NITRITE NEGATIVE 10/02/2017 1718   LEUKOCYTESUR LARGE (A) 10/02/2017 1718    Radiological Exams on Admission: Ct Head Wo Contrast  Result Date: 10/02/2017 CLINICAL DATA:  Seizure EXAM: CT HEAD WITHOUT CONTRAST TECHNIQUE: Contiguous axial images were obtained from the base of the skull through the vertex without intravenous contrast. COMPARISON:  None. FINDINGS: Brain: There is atrophy and chronic small vessel disease changes. No acute intracranial abnormality. Specifically, no hemorrhage, hydrocephalus, mass lesion, acute infarction, or significant intracranial injury. Vascular: No hyperdense vessel or unexpected calcification. Skull: No acute calvarial abnormality. Sinuses/Orbits: Visualized paranasal sinuses and mastoids clear. Orbital soft tissues unremarkable. Other: None IMPRESSION: No acute intracranial abnormality. Atrophy, chronic microvascular disease. Electronically Signed   By: Rolm Baptise M.D.   On: 10/02/2017 17:53    EKG: Independently reviewed.  Normal sinus rhythm with left bundle branch block (no recent EKG to compare) Had LBBB in 2017 EKG strip    Assessment and Plan:   1.  Syncope: Vagal versus cardiac.  Patient was in pain during the episode which could explain a vagal syncopal event.  Daughter also reports that when EMS arrived her blood pressure was "very low".  Currently her systolic blood pressure is in 140s.  Check orthostatics.  Her labs show significant hypokalemia, she does have abnormal EKG and mildly elevated troponin (although possible in the setting of hypotension).  Will admit to telemetry to monitor for arrhythmias.  Repeat potassium level tonight after replacement that has been started in the ER (per [facility] received 40 mg oral and 10 mg IV).  Hold the new medication: Lokelma. Will obtain echo  and continue atenolol.  Add baby aspirin for now.  Consult cardiology for a.m.  2.  Hypokalemia: Likely secondary to [facility]. Plan as described above  3.  Chronic kidney disease stage III-IV: Stable at creatinine around 2.  Resume home medications except Lokelma.   4.  Anemia: Likely  anemia of chronic disease although her hemoglobin dropped to 8 from 9.9 in February 2019.  Will order iron studies.  No signs of acute bleeding  5 Gouty flare:.  Resume prednisone and allopurinol.  X-ray of left foot ordered.  6.  Abnormal UA: Denies any symptoms of dysuria.  Defer antibiotics.  7.  Hypertension: Hold Norvasc for now until orthostasis ruled out.  Resume atenolol  8. ?DM: check HgbA1C. SSI for now  DVT prophylaxis: Lovenox  Code Status: Full code  Family Communication: Discussed with patient as well as family bedside. Health care proxy would be son and daughter Consults called: Epic message sent to cardiology Gay Filler Admission status:  Patient admitted as observation as anticipated LOS less than 2 midnights    Guilford Shi MD [facility] Pager [phone]  If 7PM-7AM, please contact night-coverage [email or web address] Password [facility]  10/02/2017, 7:26 PM

## 2017-10-02 NOTE — ED Notes (Signed)
Patient transported to CT 

## 2017-10-02 NOTE — ED Provider Notes (Signed)
[facility] EMERGENCY DEPARTMENT Provider Note   CSN: [id number] Arrival date & time: 10/02/17  1410     History   Chief Complaint Chief Complaint  Patient presents with  . Loss of Consciousness    HPI TISHIA MAESTRE is a 77 y.o. female.  Patient is a 77 year old female who presents after syncopal episode.  She has a history of diabetes, hypertension, hyperlipidemia and chronic kidney disease.  She was out with her family this morning and when she came back home she was having pain in her right foot which she feels is a gout flareup.  She was having trouble walking because the pain in her family carried her inside.  As she was going inside she had a syncopal event.  She is unresponsive for a few minutes.  She had no visualized seizure activity.  No incontinence.  She did not bite her tongue.  She denies any history of similar episodes in the past.  She denies any preceding palpitations, dizziness or other symptoms that led to the syncope.  She denies any recent illnesses other than the gout.  She did go to urgent care earlier today and was given a prescription for a prednisone pack for which she took the first dose today.  She has not taken any other new medications.  She denies any chest pain or shortness of breath.     Past Medical History:  Diagnosis Date  . ANEMIA, NORMOCYTIC   . BREAST CYST, HX OF   . CARPAL TUNNEL SYNDROME, BILATERAL   . DIABETES MELLITUS, TYPE II   . Glaucoma   . GLUCOMA   . HYPERLIPIDEMIA   . HYPERTENSION   . OBESITY   . PERIPHERAL NEUROPATHY   . RENAL FAILURE, CHRONIC     Patient Active Problem List   Diagnosis Date Noted  . 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 B12 deficiency 04/11/2017  . Left flank pain 08/30/2015  . Cough 08/26/2015  . Lumbago 02/25/2015  . Essential tremor   . Glaucoma 01/08/2010  . Chronic kidney disease (CKD), stage IV (severe) ([place]) 12/10/2008  . DM (diabetes mellitus), secondary,  uncontrolled, with renal complications ([place]) 98/92/1194  . Diabetic neuropathy ([place]) 02/22/2006  . Dyslipidemia 02/22/2006  . OBESITY 02/22/2006  . 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 CARPAL TUNNEL SYNDROME, BILATERAL 02/22/2006  . Essential hypertension 02/22/2006  . CONSTIPATION 02/22/2006    Past Surgical History:  Procedure Laterality Date  . CATARACT EXTRACTION BILATERAL W/ ANTERIOR VITRECTOMY  05/2010  . COLONOSCOPY     [facility]/7-8 years     OB History   None      Home Medications    Prior to Admission medications   Medication Sig Start Date End Date Taking? Authorizing Provider  allopurinol (ZYLOPRIM) 100 MG tablet Take 2 tablets (200 mg total) by mouth daily. 07/12/17  Yes Burns, Claudina Lick, MD  amLODipine (NORVASC) 10 MG tablet TAKE 1 TABLET EVERY DAY 07/04/17  Yes Burns, Claudina Lick, MD  atenolol (TENORMIN) 50 MG tablet TAKE 1 TABLET BY MOUTH ONCE DAILY 09/12/17  Yes Burns, Claudina Lick, MD  atorvastatin (LIPITOR) 40 MG tablet TAKE 1 TABLET EVERY DAY 07/04/17  Yes Burns, Claudina Lick, MD  LOKELMA 10 g PACK packet Take 10 g by mouth daily. 09/12/17  Yes [provider]  predniSONE (DELTASONE) 10 MG tablet Take 10-40 mg by mouth See admin instructions. Day 1-2 take 82m, day 3-4 take 378m day 5-6 take 2020mday  7-8 take 12m til finished then stop. 10/02/17  Yes [provider]  Alcohol Swabs (B-D SINGLE USE SWABS REGULAR) PADS Use as directed. Dx E11.9 04/08/16   BBinnie Rail MD  Blood Glucose Calibration (TRUE METRIX LEVEL 1) Low SOLN Use as directed Dx. E11.9 04/08/16   BBinnie Rail MD  blood glucose meter kit and supplies KIT Dispense based on patient and insurance preference. Use up to four times daily as directed. (FOR ICD-9 250.00, 250.01). 08/29/15   BBinnie Rail MD  Blood Glucose Monitoring Suppl (TRUE METRIX AIR GLUCOSE METER) w/Device KIT 1 Device by Does not apply route daily. Use to check blood sugars twice a day. Dx E11.9 04/08/16   BBinnie Rail MD  cyanocobalamin  2000 MCG tablet Take 1 tablet (2,000 mcg total) by mouth daily. Patient not taking: Reported on 10/02/2017 04/11/17   BBinnie Rail MD  glucose blood (TRUE METRIX BLOOD GLUCOSE TEST) test strip 1 each by Other route 2 (two) times daily. Use to check blood sugars twice a day. Dx E11.9 04/08/16   BBinnie Rail MD  TRUEPLUS LANCETS 33G MISC Use to help check blood sugars twice a day. Dx E11.9 04/08/16   BBinnie Rail MD    Family History Family History  Problem Relation Age of Onset  . Arthritis Son   . Alcohol abuse Son   . Diabetes Son        parent, grandparent  . Hypertension Son        parent, grandparent and other  . Colon cancer Son 533   Social History Social History   Tobacco Use  . Smoking status: Never Smoker  . Smokeless tobacco: Never Used  Substance Use Topics  . Alcohol use: No  . Drug use: No     Allergies   Lisinopril   Review of Systems Review of Systems  Constitutional: Negative for chills, diaphoresis, fatigue and fever.  HENT: Negative for congestion, rhinorrhea and sneezing.   Eyes: Negative.   Respiratory: Negative for cough, chest tightness and shortness of breath.   Cardiovascular: Negative for chest pain and leg swelling.  Gastrointestinal: Negative for abdominal pain, blood in stool, diarrhea, nausea and vomiting.  Genitourinary: Negative for difficulty urinating, flank pain, frequency and hematuria.  Musculoskeletal: Positive for arthralgias. Negative for back pain.  Skin: Negative for rash.  Neurological: Positive for syncope. Negative for dizziness, speech difficulty, weakness, numbness and headaches.     Physical Exam Updated Vital Signs BP 133/66   Pulse 73   Resp (!) 21   Ht '5\' 6"'  (1.676 m)   Wt 96.6 kg (213 lb)   SpO2 97%   BMI 34.38 kg/m   Physical Exam  Constitutional: She is oriented to person, place, and time. She appears well-developed and well-nourished.  HENT:  Head: Normocephalic and atraumatic.  Eyes: Pupils are  equal, round, and reactive to light.  Neck: Normal range of motion. Neck supple.  Cardiovascular: Normal rate, regular rhythm and normal heart sounds.  Pulmonary/Chest: Effort normal and breath sounds normal. No respiratory distress. She has no wheezes. She has no rales. She exhibits no tenderness.  Abdominal: Soft. Bowel sounds are normal. There is no tenderness. There is no rebound and no guarding.  Musculoskeletal: Normal range of motion. She exhibits edema.  Mild warmth and edema to the dorsum of the right foot.  Lymphadenopathy:    She has no cervical adenopathy.  Neurological: She is alert and oriented to person, place,  and time.  Motor 5/5 all extremities Sensation grossly intact to LT all extremities Finger to Nose intact, no pronator drift CN II-XII grossly intact    Skin: Skin is warm and dry. No rash noted.  Psychiatric: She has a normal mood and affect.     ED Treatments / Results  Labs (all labs ordered are listed, but only abnormal results are displayed) Labs Reviewed  BASIC METABOLIC PANEL - Abnormal; Notable for the following components:      Result Value   Potassium 2.2 (*)    Glucose, Bld 163 (*)    BUN 29 (*)    Creatinine, Ser 2.28 (*)    GFR calc non Af Amer 20 (*)    GFR calc Af Amer 23 (*)    All other components within normal limits  CBC - Abnormal; Notable for the following components:   RBC 2.86 (*)    Hemoglobin 8.1 (*)    HCT 25.7 (*)    All other components within normal limits  URINALYSIS, ROUTINE W REFLEX MICROSCOPIC - Abnormal; Notable for the following components:   APPearance CLOUDY (*)    Hgb urine dipstick SMALL (*)    Protein, ur 30 (*)    Leukocytes, UA LARGE (*)    Bacteria, UA RARE (*)    All other components within normal limits  TROPONIN I - Abnormal; Notable for the following components:   Troponin I 0.05 (*)    All other components within normal limits  CBG MONITORING, ED - Abnormal; Notable for the following components:    Glucose-Capillary 151 (*)    All other components within normal limits  I-STAT CHEM 8, ED    EKG EKG Interpretation  Date/Time:  Sunday October 02 2017 14:17:17 EDT Ventricular Rate:  75 PR Interval:    QRS Duration: 171 QT Interval:  480 QTC Calculation: 537 R Axis:   -25 Text Interpretation:  Sinus rhythm Left bundle branch block new LBBB  as compared to EKG from 2003 Confirmed by Malvin Johns [phone]) on 10/02/2017 2:21:10 PM Also confirmed by Malvin Johns [phone]), editor Philomena Doheny [phone])  on 10/02/2017 3:45:24 PM   Radiology Ct Head Wo Contrast  Result Date: 10/02/2017 CLINICAL DATA:  Seizure EXAM: CT HEAD WITHOUT CONTRAST TECHNIQUE: Contiguous axial images were obtained from the base of the skull through the vertex without intravenous contrast. COMPARISON:  None. FINDINGS: Brain: There is atrophy and chronic small vessel disease changes. No acute intracranial abnormality. Specifically, no hemorrhage, hydrocephalus, mass lesion, acute infarction, or significant intracranial injury. Vascular: No hyperdense vessel or unexpected calcification. Skull: No acute calvarial abnormality. Sinuses/Orbits: Visualized paranasal sinuses and mastoids clear. Orbital soft tissues unremarkable. Other: None IMPRESSION: No acute intracranial abnormality. Atrophy, chronic microvascular disease. Electronically Signed   By: Rolm Baptise M.D.   On: 10/02/2017 17:53    Procedures Procedures (including critical care time)  Medications Ordered in ED Medications  potassium chloride 10 mEq in 100 mL IVPB (10 mEq Intravenous New Bag/Given 10/02/17 1820)  potassium chloride SA (K-DUR,KLOR-CON) CR tablet 40 mEq (40 mEq Oral Given 10/02/17 1618)     Initial Impression / Assessment and Plan / ED Course  I have reviewed the triage vital signs and the nursing notes.  Pertinent labs & imaging results that were available during my care of the patient were reviewed by me and considered in my medical decision  making (see chart for details).     Patient is a 77 year old female who presents after a syncopal  event.  She is neurologically intact.  She is not reporting any chest pain or palpitations.  Her EKG shows a sinus rhythm with a left bundle branch block.  This is new compared to her old EKG from 2003.  There is no more recent EKGs.  Her troponin is minimally bumped but she has denied any chest tightness or pain during her ED stay.  She has a markedly low potassium of 2.1 and was started on potassium replacement both orally and IV.  Her hemoglobin is low but she has baseline anemia of chronic disease.  Her creatinine is similar to her baseline values.  I spoke with the hospitalist, Dr. Earnest Conroy who will admit the pt.  CRITICAL CARE Performed by: Malvin Johns Total critical care time: 60 minutes Critical care time was exclusive of separately billable procedures and treating other patients. Critical care was necessary to treat or prevent imminent or life-threatening deterioration. Critical care was time spent personally by me on the following activities: development of treatment plan with patient and/or surrogate as well as nursing, discussions with consultants, evaluation of patient's response to treatment, examination of patient, obtaining history from patient or surrogate, ordering and performing treatments and interventions, ordering and review of laboratory studies, ordering and review of radiographic studies, pulse oximetry and re-evaluation of patient's condition.   Final Clinical Impressions(s) / ED Diagnoses   Final diagnoses:  Syncope and collapse  LBBB (left bundle branch block)  NSTEMI (non-ST elevated myocardial infarction) [facility])  Hypokalemia    ED Discharge Orders    None       Malvin Johns, MD 10/02/17 1859

## 2017-10-02 NOTE — ED Triage Notes (Signed)
Pt brought in by [facility] from home for syncopal episode after leaving PCP. Pt was seen for gout. Per pt daughter- pt was weak after getting home, pt denies falling but had near syncopal episode. Pt initially A+Ox2 on EMS arrival. Pt daughter states pt started prednisone today (pt took 40mg  PTA). Per EMS LBBB noted on 12-lead- family states no hx. Pt 91% on RA. Pt currently A+Ox4 and has no complaints. Per pt family pt has a "strong urine smell".

## 2017-10-03 ENCOUNTER — Observation Stay (HOSPITAL_BASED_OUTPATIENT_CLINIC_OR_DEPARTMENT_OTHER): Payer: Medicare HMO

## 2017-10-03 ENCOUNTER — Encounter (HOSPITAL_COMMUNITY): Payer: Self-pay | Admitting: Cardiology

## 2017-10-03 DIAGNOSIS — I447 Left bundle-branch block, unspecified: Secondary | ICD-10-CM | POA: Diagnosis not present

## 2017-10-03 DIAGNOSIS — M109 Gout, unspecified: Secondary | ICD-10-CM | POA: Diagnosis not present

## 2017-10-03 DIAGNOSIS — E876 Hypokalemia: Secondary | ICD-10-CM | POA: Diagnosis not present

## 2017-10-03 DIAGNOSIS — R52 Pain, unspecified: Secondary | ICD-10-CM | POA: Diagnosis not present

## 2017-10-03 DIAGNOSIS — R55 Syncope and collapse: Secondary | ICD-10-CM | POA: Diagnosis not present

## 2017-10-03 DIAGNOSIS — I361 Nonrheumatic tricuspid (valve) insufficiency: Secondary | ICD-10-CM | POA: Diagnosis not present

## 2017-10-03 LAB — ECHOCARDIOGRAM COMPLETE
Height: 66 in
WEIGHTICAEL: 3319.25 [oz_av]

## 2017-10-03 LAB — CBC
HCT: 28 % — ABNORMAL LOW (ref 36.0–46.0)
Hemoglobin: 8.9 g/dL — ABNORMAL LOW (ref 12.0–15.0)
MCH: 27.9 pg (ref 26.0–34.0)
MCHC: 31.8 g/dL (ref 30.0–36.0)
MCV: 87.8 fL (ref 78.0–100.0)
PLATELETS: 260 10*3/uL (ref 150–400)
RBC: 3.19 MIL/uL — AB (ref 3.87–5.11)
RDW: 13.3 % (ref 11.5–15.5)
WBC: 8.1 10*3/uL (ref 4.0–10.5)

## 2017-10-03 LAB — TROPONIN I
TROPONIN I: 0.03 ng/mL — AB (ref <0.03)
Troponin I: 0.03 ng/mL (ref <0.03)
Troponin I: 0.03 ng/mL (ref <0.03)

## 2017-10-03 LAB — BASIC METABOLIC PANEL
ANION GAP: 15 (ref 5–15)
BUN: 32 mg/dL — ABNORMAL HIGH (ref 8–23)
CALCIUM: 9.1 mg/dL (ref 8.9–10.3)
CHLORIDE: 98 mmol/L (ref 98–111)
CO2: 25 mmol/L (ref 22–32)
Creatinine, Ser: 2.02 mg/dL — ABNORMAL HIGH (ref 0.44–1.00)
GFR calc non Af Amer: 23 mL/min — ABNORMAL LOW (ref >60)
GFR, EST AFRICAN AMERICAN: 26 mL/min — AB (ref >60)
Glucose, Bld: 206 mg/dL — ABNORMAL HIGH (ref 70–99)
Potassium: 3.1 mmol/L — ABNORMAL LOW (ref 3.5–5.1)
SODIUM: 138 mmol/L (ref 135–145)

## 2017-10-03 LAB — GLUCOSE, CAPILLARY
GLUCOSE-CAPILLARY: 225 mg/dL — AB (ref 70–99)
GLUCOSE-CAPILLARY: 408 mg/dL — AB (ref 70–99)
GLUCOSE-CAPILLARY: 248 mg/dL — AB (ref 70–99)
Glucose-Capillary: 167 mg/dL — ABNORMAL HIGH (ref 70–99)
Glucose-Capillary: 395 mg/dL — ABNORMAL HIGH (ref 70–99)

## 2017-10-03 LAB — MAGNESIUM: Magnesium: 1.8 mg/dL (ref 1.7–2.4)

## 2017-10-03 MED ORDER — AMLODIPINE BESYLATE 10 MG PO TABS
10.0000 mg | ORAL_TABLET | Freq: Every day | ORAL | Status: DC
  Administered 2017-10-03 – 2017-10-04 (×2): 10 mg via ORAL
  Filled 2017-10-03 (×2): qty 1

## 2017-10-03 MED ORDER — HYDROCODONE-ACETAMINOPHEN 5-325 MG PO TABS
1.0000 | ORAL_TABLET | ORAL | Status: DC | PRN
  Administered 2017-10-03: 1 via ORAL
  Filled 2017-10-03: qty 1

## 2017-10-03 MED ORDER — ACETAMINOPHEN 325 MG PO TABS: 650.0000 mg | ORAL_TABLET | Freq: Four times a day (QID) | ORAL | Status: DC | PRN

## 2017-10-03 MED ORDER — POTASSIUM CHLORIDE CRYS ER 20 MEQ PO TBCR
40.0000 meq | EXTENDED_RELEASE_TABLET | Freq: Once | ORAL | Status: AC
  Administered 2017-10-03: 40 meq via ORAL
  Filled 2017-10-03: qty 2

## 2017-10-03 MED ORDER — INSULIN ASPART 100 UNIT/ML ~~LOC~~ SOLN
4.0000 [IU] | Freq: Three times a day (TID) | SUBCUTANEOUS | Status: DC
  Administered 2017-10-03 – 2017-10-04 (×3): 4 [IU] via SUBCUTANEOUS

## 2017-10-03 MED ORDER — DOCUSATE SODIUM 100 MG PO CAPS: 100.0000 mg | ORAL_CAPSULE | Freq: Two times a day (BID) | ORAL | Status: DC | PRN

## 2017-10-03 MED ORDER — MAGNESIUM SULFATE IN D5W 1-5 GM/100ML-% IV SOLN
1.0000 g | Freq: Once | INTRAVENOUS | Status: AC
  Administered 2017-10-03: 1 g via INTRAVENOUS
  Filled 2017-10-03: qty 100

## 2017-10-03 MED ORDER — INSULIN ASPART 100 UNIT/ML ~~LOC~~ SOLN: 4.0000 [IU] | Freq: Three times a day (TID) | SUBCUTANEOUS | Status: DC

## 2017-10-03 MED ORDER — SODIUM CHLORIDE 0.9 % IV SOLN
INTRAVENOUS | Status: DC | PRN
  Administered 2017-10-03: 15:00:00 via INTRAVENOUS

## 2017-10-03 MED ORDER — INSULIN ASPART 100 UNIT/ML ~~LOC~~ SOLN
4.0000 [IU] | Freq: Once | SUBCUTANEOUS | Status: AC
  Administered 2017-10-03: 4 [IU] via SUBCUTANEOUS

## 2017-10-03 NOTE — Consult Note (Signed)
CARDIOLOGY CONSULT NOTE  Patient ID: Erin Liu MRN: [id number] DOB/AGE: 05/28/1940 77 y.o.  Admit date: 10/02/2017 Primary Physician Binnie Rail, MD Primary Cardiologist :  New Chief Complaint  Syncope Requesting  Dr. Eliseo Squires  HPI:   The patient has no prior cardiac history.  She gets around pretty well at home but has been limited by gout for the past week.  She went to urgent care and was treated with steroids and was coming home from this visit yesterday.  Her family was pulling her up into the house on her rolling walker.  When she got into the house briefly became unresponsive.  He said her arms went up and they thought a few seconds that she was having a seizure but she quickly improved.  She never fully lost consciousness.  All of this is related by her daughter who was there.  The patient does not recall symptoms.  She otherwise has had no cardiac history or work-up.  She denies any ongoing cardiovascular symptoms. The patient denies any new symptoms such as chest discomfort, neck or arm discomfort. There has been no new shortness of breath, PND or orthopnea. There have been no reported palpitations, presyncope or syncope.  In the ED she was noted to have anemia which is baseline and hypokalemia.   Echo today demonstrated NL LV function and no significant valvular disease.  Head CT demonstrated no acute changes.   Troponin has been nondiagnostic with flat mild elevation at 0.03.       Past Medical History:  Diagnosis Date  . ANEMIA, NORMOCYTIC   . BREAST CYST, HX OF   . CARPAL TUNNEL SYNDROME, BILATERAL   . DIABETES MELLITUS, TYPE II   . Glaucoma   . GLUCOMA   . HYPERLIPIDEMIA   . HYPERTENSION   . OBESITY   . PERIPHERAL NEUROPATHY   . RENAL FAILURE, CHRONIC     Past Surgical History:  Procedure Laterality Date  . CATARACT EXTRACTION BILATERAL W/ ANTERIOR VITRECTOMY  05/2010  . COLONOSCOPY     [facility]/7-8 years    Allergies  Allergen Reactions  . Lisinopril    REACTION: cough   Medications Prior to Admission  Medication Sig Dispense Refill Last Dose  . allopurinol (ZYLOPRIM) 100 MG tablet Take 2 tablets (200 mg total) by mouth daily. 60 tablet 6 10/02/2017 at Unknown time  . amLODipine (NORVASC) 10 MG tablet TAKE 1 TABLET EVERY DAY 90 tablet 2 10/02/2017 at Unknown time  . atenolol (TENORMIN) 50 MG tablet TAKE 1 TABLET BY MOUTH ONCE DAILY 30 tablet 5 10/02/2017 at 930am  . atorvastatin (LIPITOR) 40 MG tablet TAKE 1 TABLET EVERY DAY 90 tablet 2 10/02/2017 at Unknown time  . LOKELMA 10 g PACK packet Take 10 g by mouth daily.  6 10/02/2017 at Unknown time  . predniSONE (DELTASONE) 10 MG tablet Take 10-40 mg by mouth See admin instructions. Day 1-2 take 86m, day 3-4 take 350m day 5-6 take 2029mday 7-8 take 81m81ml finished then stop.   10/02/2017 at Unknown time  . Alcohol Swabs (B-D SINGLE USE SWABS REGULAR) PADS Use as directed. Dx E11.9 200 each 2 Taking  . Blood Glucose Calibration (TRUE METRIX LEVEL 1) Low SOLN Use as directed Dx. E11.9 3 each 0 Taking  . blood glucose meter kit and supplies KIT Dispense based on patient and insurance preference. Use up to four times daily as directed. (FOR ICD-9 250.00, 250.01). 1 each 0 Taking  . Blood Glucose Monitoring  Suppl (TRUE METRIX AIR GLUCOSE METER) w/Device KIT 1 Device by Does not apply route daily. Use to check blood sugars twice a day. Dx E11.9 1 kit 0 Taking  . cyanocobalamin 2000 MCG tablet Take 1 tablet (2,000 mcg total) by mouth daily. (Patient not taking: Reported on 10/02/2017) 90 tablet 3 Not Taking at Unknown time  . glucose blood (TRUE METRIX BLOOD GLUCOSE TEST) test strip 1 each by Other route 2 (two) times daily. Use to check blood sugars twice a day. Dx E11.9 200 each 2 Taking  . TRUEPLUS LANCETS 33G MISC Use to help check blood sugars twice a day. Dx E11.9 200 each 2 Taking   Family History  Problem Relation Age of Onset  . Arthritis Son   . Alcohol abuse Son   . Diabetes Son        parent,  grandparent  . Hypertension Son        parent, grandparent and other  . Colon cancer Son 31    Social History   Socioeconomic History  . Marital status: Widowed    Spouse name: Not on file  . Number of children: Not on file  . Years of education: Not on file  . Highest education level: Not on file  Occupational History  . Not on file  Social Needs  . Financial resource strain: Not on file  . Food insecurity:    Worry: Not on file    Inability: Not on file  . Transportation needs:    Medical: Not on file    Non-medical: Not on file  Tobacco Use  . Smoking status: Never Smoker  . Smokeless tobacco: Never Used  Substance and Sexual Activity  . Alcohol use: No  . Drug use: No  . Sexual activity: Not on file  Lifestyle  . Physical activity:    Days per week: Not on file    Minutes per session: Not on file  . Stress: Not on file  Relationships  . Social connections:    Talks on phone: Not on file    Gets together: Not on file    Attends religious service: Not on file    Active member of club or organization: Not on file    Attends meetings of clubs or organizations: Not on file    Relationship status: Not on file  . Intimate partner violence:    Fear of current or ex partner: Not on file    Emotionally abused: Not on file    Physically abused: Not on file    Forced sexual activity: Not on file  Other Topics Concern  . Not on file  Social History Narrative   Widowed, lives with son. Retired Medical illustrator     ROS: Positive for constipation as stated in the HPI and negative and for all other systems.  Physical Exam: Blood pressure (!) 143/64, pulse 72, temperature 98.7 F (37.1 C), temperature source Oral, resp. rate 18, height _0  (1.676 m), weight 207 lb 7.3 oz (94.1 kg), SpO2 96 %.  GENERAL:  Well appearing NECK:  No jugular venous distention, waveform within normal limits, carotid upstroke brisk and symmetric, no bruits, no thyromegaly LUNGS:  Clear to  auscultation bilaterally CHEST:  Unremarkable HEART:  PMI not displaced or sustained,S1 and S2 within normal limits, no S3, no S4, no clicks, no rubs, no murmurs ABD:  Flat, positive bowel sounds normal in frequency in pitch, no bruits, no rebound, no guarding, no midline pulsatile mass, no hepatomegaly,  no splenomegaly EXT:  2 plus pulses throughout except decreased pulses on the right radial, no edema, no cyanosis no clubbing, swelling right ankle and foot   Labs: Lab Results  Component Value Date   BUN 32 (H) 10/03/2017   Lab Results  Component Value Date   CREATININE 2.02 (H) 10/03/2017   Lab Results  Component Value Date   NA 138 10/03/2017   K 3.1 (L) 10/03/2017   CL 98 10/03/2017   CO2 25 10/03/2017   Lab Results  Component Value Date   TROPONINI 0.03 (HH) 10/03/2017   Lab Results  Component Value Date   WBC 8.1 10/03/2017   HGB 8.9 (L) 10/03/2017   HCT 28.0 (L) 10/03/2017   MCV 87.8 10/03/2017   PLT 260 10/03/2017   Lab Results  Component Value Date   CHOL 206 (H) 04/11/2017   HDL 68.00 04/11/2017   LDLCALC 120 (H) 04/11/2017   LDLDIRECT 230.5 09/11/2010   TRIG 91.0 04/11/2017   CHOLHDL 3 04/11/2017   Lab Results  Component Value Date   ALT 15 04/11/2017   AST 20 04/11/2017   ALKPHOS 54 04/11/2017   BILITOT 0.5 04/11/2017   Echo:    - Left ventricle: The cavity size was normal. There was mild   concentric hypertrophy. Systolic function was normal. The   estimated ejection fraction was in the range of 50% to 55%. Wall   motion was normal; there were no regional wall motion   abnormalities. There was an increased relative contribution of   atrial contraction to ventricular filling. Doppler parameters are   consistent with abnormal left ventricular relaxation (grade 1   diastolic dysfunction). Doppler parameters are consistent with   high ventricular filling pressure. - Ventricular septum: Septal motion showed moderate paradox. These   changes are  consistent with intraventricular conduction delay. - Mitral valve: Calcified annulus. There was mild regurgitation. - Tricuspid valve: There was mild regurgitation. - Pulmonary arteries: Systolic pressure could not be accurately   estimated.   EKG:   NSR, LAD, LBBB.  No change dating back to 2014.    ASSESSMENT AND PLAN:   SYNCOPE: The etiology is not clear.  There is no arrhythmia except infrequent PVCs on tele.  She does have a left bundle branch block but this is been chronic.  She otherwise has no cardiovascular symptoms.  He was having pain and stress from being moved yesterday.  Suspect this could be a vagal episode.  They could not do orthostatics because she cannot stand.  At this point I do not think that further cardiovascular testing would be fruitful.  At this discussion with patient and her daughter and unless they have recurrent syncope I would not suggest further testing.  DM:  Per the primary team.    HTN:  BP is elevated.  I would continue previous meds at discharge.    CKD III:   Creat is baseline.   Follow per PCP and renal    Signed: Minus Breeding 10/03/2017, 12:18 PM

## 2017-10-03 NOTE — Progress Notes (Addendum)
Progress Note    Erin Liu  HFW:[id number] DOB: [birth year]  DOA: 10/02/2017 PCP: Binnie Rail, MD    Brief Narrative:     Medical records reviewed and are as summarized below:  Erin Liu is an 77 y.o. female with history h/o diabetes, hypertension, hyperlipidemia, gout and chronic kidney disease presented after a syncopal episode.  Patient has gouty flares time to time and when she has a flare she has severe pain usually in her left foot and uses Rollator with seat during the episodes.  Last Sunday patient had a gouty flare in the right foot which is unusual for her, family tried conservative measures including over-the-counter topical creams, soaking the foot in Epsom salts with no relief.  Patient's pain worsened to the point that she is been bedbound and unable to bear weight.  Daughter got a wheelchair and took her to urgent care this morning where she was prescribed prednisone.  Patient took 40 mg of prednisone on the way back home.  She was helped into the house by son and daughter.  She was sitting on the Rollator seat with family around her when she was noted to be shaking in upper extremities and fell backwards losing consciousness for few minutes.  Patient denies any prodrome of sweating palpitations chest pain prior to the episode.  She does recall hurting in her foot at the time.  No report of incontinence, typical tonic-clonic movements or tongue bite.  No report of prior dizzy spells over the last week.  She denies any new medication changes except for prednisone that was prescribed today and Lokelma  prescribed by her nephrologist in May for hyperkalemia.She is compliant with her daily dose of allopurinol.  W/U in the ED revealed hypokalemia on labs, creatinine at her baseline and anemia with Hgb 8 and mildly elevated troponin at 0.05 in setting of CKD. Hospitalist service requested to admit for further evaluation and management of syncope.        Assessment/Plan:   Active Problems:   Syncope  Vasovagal response to pain appreciated cardiology eval-- no further work up -tylenol and PRN Vicodin for pain prednisone burst for gout -once pain controlled, plan to d/c home  Gout -prednisone  Hypokalemia -replete along with Mg -stop lokelma -BMP in 1 week after d/c  CKD -Cr stable at 2  Anemia of CD -hgb stable  DM -suspect will be uncontrolled while on steroids -SSI -added carb mod diet  obesity Body mass index is 33.48 kg/m.   Family Communication/Anticipated D/C date and plan/Code Status   DVT prophylaxis: Lovenox ordered. Code Status: Full Code.  Family Communication: at bedside Disposition Plan: home later today or in the AM   Medical Consultants:   cards   Subjective:   Still with pain in foot, afraid to stand  Objective:    Vitals:   10/03/17 0504 10/03/17 0650 10/03/17 0830 10/03/17 1158  BP: (!) 151/69  (!) 134/54 (!) 143/64  Pulse: 74  80 72  Resp: 18  18 18   Temp: 98.3 F (36.8 C)  98.2 F (36.8 C) 98.7 F (37.1 C)  TempSrc: Oral  Oral Oral  SpO2: 97%  99% 96%  Weight:  94.1 kg (207 lb 7.3 oz)    Height:        Intake/Output Summary (Last 24 hours) at 10/03/2017 1354 Last data filed at 10/03/2017 1350 Gross per 24 hour  Intake 1307.14 ml  Output 250 ml  Net 1057.14 ml  Filed Weights   10/02/17 1426 10/02/17 2034 10/03/17 0650  Weight: 96.6 kg (213 lb) 95.4 kg (210 lb 5.1 oz) 94.1 kg (207 lb 7.3 oz)    Exam: Some mild swelling over dorsum of right foot, no redness, no warmth No increased work of breathing, no wheezing No LE edema Pleasant and cooperative  Data Reviewed:   I have personally reviewed following labs and imaging studies:  Labs: Labs show the following:   Basic Metabolic Panel: Recent Labs  Lab 10/02/17 1433 10/02/17 1926 10/02/17 2208 10/03/17 0615 10/03/17 1050  NA 139 137  --  138  --   K 2.2* 2.8* 2.8* 3.1*  --   CL 98 97*   --  98  --   CO2 29  --   --  25  --   GLUCOSE 163* 188*  --  206*  --   BUN 29* 29*  --  32*  --   CREATININE 2.28* 2.20*  --  2.02*  --   CALCIUM 9.2  --   --  9.1  --   MG  --   --   --   --  1.8   GFR Estimated Creatinine Clearance: 27 mL/min (A) (by C-G formula based on SCr of 2.02 mg/dL (H)). Liver Function Tests: No results for input(s): AST, ALT, ALKPHOS, BILITOT, PROT, ALBUMIN in the last 168 hours. No results for input(s): LIPASE, AMYLASE in the last 168 hours. No results for input(s): AMMONIA in the last 168 hours. Coagulation profile No results for input(s): INR, PROTIME in the last 168 hours.  CBC: Recent Labs  Lab 10/02/17 1433 10/02/17 1926 10/03/17 0615  WBC 8.6  --  8.1  HGB 8.1* 8.5* 8.9*  HCT 25.7* 25.0* 28.0*  MCV 89.9  --  87.8  PLT 252  --  260   Cardiac Enzymes: Recent Labs  Lab 10/02/17 1433 10/03/17 0017 10/03/17 0615 10/03/17 1050  TROPONINI 0.05* 0.03* 0.03* 0.03*   BNP (last 3 results) No results for input(s): PROBNP in the last 8760 hours. CBG: Recent Labs  Lab 10/02/17 1436 10/02/17 2302 10/03/17 0800 10/03/17 1114  GLUCAP 151* 302* 167* 225*   D-Dimer: No results for input(s): DDIMER in the last 72 hours. Hgb A1c: Recent Labs    10/02/17 1934  HGBA1C 6.9*   Lipid Profile: No results for input(s): CHOL, HDL, LDLCALC, TRIG, CHOLHDL, LDLDIRECT in the last 72 hours. Thyroid function studies: No results for input(s): TSH, T4TOTAL, T3FREE, THYROIDAB in the last 72 hours.  Invalid input(s): FREET3 Anemia work up: Recent Labs    10/02/17 2208  FERRITIN 405*  TIBC 193*  IRON 18*  RETICCTPCT 1.0   Sepsis Labs: Recent Labs  Lab 10/02/17 1433 10/03/17 0615  WBC 8.6 8.1    Microbiology No results found for this or any previous visit (from the past 240 hour(s)).  Procedures and diagnostic studies:  Ct Head Wo Contrast  Result Date: 10/02/2017 CLINICAL DATA:  Seizure EXAM: CT HEAD WITHOUT CONTRAST TECHNIQUE:  Contiguous axial images were obtained from the base of the skull through the vertex without intravenous contrast. COMPARISON:  None. FINDINGS: Brain: There is atrophy and chronic small vessel disease changes. No acute intracranial abnormality. Specifically, no hemorrhage, hydrocephalus, mass lesion, acute infarction, or significant intracranial injury. Vascular: No hyperdense vessel or unexpected calcification. Skull: No acute calvarial abnormality. Sinuses/Orbits: Visualized paranasal sinuses and mastoids clear. Orbital soft tissues unremarkable. Other: None IMPRESSION: No acute intracranial abnormality. Atrophy, chronic microvascular disease. Electronically  Signed   By: Rolm Baptise M.D.   On: 10/02/2017 17:53   Dg Foot 2 Views Right  Result Date: 10/02/2017 CLINICAL DATA:  Pain EXAM: RIGHT FOOT - 2 VIEW COMPARISON:  None. FINDINGS: Diffuse vascular calcifications. Calcaneal spurs. Degenerative changes in the hindfoot. No acute bony abnormality. Specifically, no fracture, subluxation, or dislocation. IMPRESSION: No acute bony abnormality. Electronically Signed   By: Rolm Baptise M.D.   On: 10/02/2017 21:15    Medications:   . allopurinol  200 mg Oral Daily  . aspirin EC  81 mg Oral Daily  . atenolol  50 mg Oral Daily  . atorvastatin  40 mg Oral q1800  . enoxaparin (LOVENOX) injection  30 mg Subcutaneous Q24H  . insulin aspart  0-15 Units Subcutaneous TID WC  . predniSONE  40 mg Oral Q breakfast   Continuous Infusions: . magnesium sulfate 1 - 4 g bolus IVPB       LOS: 0 days   Geradine Girt  [facility]   *Please refer to [email or web address], password [email or web address] to get updated schedule on who will round on this patient, as hospitalists switch teams weekly. If 7PM-7AM, please contact night-coverage at [email or web address], password [email or web address] for any overnight needs.  10/03/2017, 1:54 PM

## 2017-10-03 NOTE — Progress Notes (Signed)
Patient CBG is 408.  Paged doctor

## 2017-10-03 NOTE — Progress Notes (Signed)
Patient was trying to get out of bed and trying to pull her heart monitor off when I walked in the room, her blood glucose was 395, MD on call paged.

## 2017-10-03 NOTE — Progress Notes (Signed)
Inpatient Diabetes Program Recommendations  AACE/ADA: New Consensus Statement on Inpatient Glycemic Control (2015)  Target Ranges:  Prepandial:   less than 140 mg/dL      Peak postprandial:   less than 180 mg/dL (1-2 hours)      Critically ill patients:  140 - 180 mg/dL   Lab Results  Component Value Date   GLUCAP 225 (H) 10/03/2017   HGBA1C 6.9 (H) 10/02/2017    Review of Glycemic Control Results for Clauson, ALANNA STORTI (MRN [id number]) as of 10/03/2017 13:49  Ref. Range 10/02/2017 14:36 10/02/2017 23:02 10/03/2017 08:00 10/03/2017 11:14  Glucose-Capillary Latest Ref Range: 70 - 99 mg/dL 151 (H) 302 (H) 167 (H) 225 (H)   Diabetes history: Type 2 DM -A1C=6.9% Outpatient Diabetes medications:  None noted Current orders for Inpatient glycemic control:  Novolog moderate tid with meals, Prednisone 40 mg daily Inpatient Diabetes Program Recommendations:   While in the hospital and on steroids, consider adding Novolog 3 units tid with meals since Prednisone usually increases post-prandial blood sugars.   Thanks,  Adah Perl, RN, BC-ADM Inpatient Diabetes Coordinator Pager [phone] (8a-5p)

## 2017-10-03 NOTE — Progress Notes (Signed)
  Echocardiogram 2D Echocardiogram has been performed.  Jlyn Bracamonte L Androw 10/03/2017, 10:18 AM

## 2017-10-04 DIAGNOSIS — I447 Left bundle-branch block, unspecified: Secondary | ICD-10-CM | POA: Diagnosis not present

## 2017-10-04 DIAGNOSIS — R52 Pain, unspecified: Secondary | ICD-10-CM | POA: Diagnosis not present

## 2017-10-04 DIAGNOSIS — R55 Syncope and collapse: Secondary | ICD-10-CM | POA: Diagnosis not present

## 2017-10-04 DIAGNOSIS — E876 Hypokalemia: Secondary | ICD-10-CM | POA: Diagnosis not present

## 2017-10-04 DIAGNOSIS — M109 Gout, unspecified: Secondary | ICD-10-CM | POA: Diagnosis not present

## 2017-10-04 LAB — GLUCOSE, CAPILLARY
Glucose-Capillary: 159 mg/dL — ABNORMAL HIGH (ref 70–99)
Glucose-Capillary: 181 mg/dL — ABNORMAL HIGH (ref 70–99)

## 2017-10-04 LAB — BASIC METABOLIC PANEL
Anion gap: 8 (ref 5–15)
BUN: 47 mg/dL — AB (ref 8–23)
CHLORIDE: 101 mmol/L (ref 98–111)
CO2: 27 mmol/L (ref 22–32)
Calcium: 8.9 mg/dL (ref 8.9–10.3)
Creatinine, Ser: 2.5 mg/dL — ABNORMAL HIGH (ref 0.44–1.00)
GFR calc Af Amer: 20 mL/min — ABNORMAL LOW (ref >60)
GFR, EST NON AFRICAN AMERICAN: 17 mL/min — AB (ref >60)
GLUCOSE: 157 mg/dL — AB (ref 70–99)
Potassium: 3.4 mmol/L — ABNORMAL LOW (ref 3.5–5.1)
Sodium: 136 mmol/L (ref 135–145)

## 2017-10-04 MED ORDER — ACETAMINOPHEN 325 MG PO TABS: 650.0000 mg | ORAL_TABLET | Freq: Four times a day (QID) | ORAL | Status: DC | PRN

## 2017-10-04 MED ORDER — HYDROCODONE-ACETAMINOPHEN 5-325 MG PO TABS: 1.0000 | ORAL_TABLET | ORAL | 0 refills | Status: DC | PRN

## 2017-10-04 MED ORDER — PREDNISONE 20 MG PO TABS
30.0000 mg | ORAL_TABLET | Freq: Every day | ORAL | Status: DC
  Administered 2017-10-04: 30 mg via ORAL
  Filled 2017-10-04: qty 1

## 2017-10-04 MED ORDER — POTASSIUM CHLORIDE CRYS ER 20 MEQ PO TBCR
40.0000 meq | EXTENDED_RELEASE_TABLET | Freq: Once | ORAL | Status: AC
  Administered 2017-10-04: 40 meq via ORAL
  Filled 2017-10-04: qty 2

## 2017-10-04 NOTE — Progress Notes (Signed)
Charge RN discharged pt. [name] grandson is driving her home. Pt discharged via wheelchair. Pt is not in distress.

## 2017-10-04 NOTE — Discharge Summary (Signed)
Physician Discharge Summary  Erin Liu WLS:[id number] DOB: 1941-03-05 DOA: 10/02/2017  PCP: Binnie Rail, MD  Admit date: 10/02/2017 Discharge date: 10/04/2017  Admitted From: home Discharge disposition: home   Recommendations for Outpatient Follow-Up:   1. Pain management of gout-- bowel regimen while taking meds 2. Stopped lokema for hyperkalemia- BMP periodically 3. strict diabetic diet while on steroids 4. Home health with 24 hour supervision by family   Discharge Diagnosis:   Active Problems:   Syncope gout Pain from gout   Discharge Condition: Improved.  Diet recommendation: Low sodium, heart healthy.  Carbohydrate-modified.  Regular.  Wound care: None.  Code status: Full.   History of Present Illness:   Erin Liu is an 77 y.o. female with history h/odiabetes, hypertension, hyperlipidemia, goutand chronic kidney disease presented after a syncopal episode. Patient has gouty flares time to time and when she has a flare she has severe pain usually in her left foot and uses Rollator with seat during the episodes. Last Sunday patient had a goutyflare in the right foot which is unusual for her, family tried conservative measures including over-the-counter topical creams, soaking the foot in Epsom salts with no relief. Patient's pain worsened to the point that she is been bedbound and unable to bear weight. Daughter got a wheelchair and took her to urgent care this morning where she was prescribed prednisone. Patient took 40 mg of prednisone on the way back home. She was helped into the house by son and daughter. She was sitting on the Rollator seat with family around her when she was noted to be shaking in upper extremities and fell backwards losing consciousness for few minutes. Patient denies any prodrome of sweating palpitations chest pain prior to the episode. She does recall hurting in her foot at the time. No report of incontinence,  typical tonic-clonic movements or tongue bite. No report of prior dizzy spells over the last week. She denies any new medication changes except for prednisone that was prescribed today and Lokelmaprescribed by her nephrologist in May for hyperkalemia.She iscompliant with her daily dose of allopurinol.  W/U in the ED revealed hypokalemia on labs, creatinine at her baseline and anemia with Hgb 8 and mildly elevated troponin at 0.05 in setting of CKD. Hospitalist service requested to admit for further evaluation and management of syncope     Hospital Course by Problem:   Vasovagal response to pain appreciated cardiology eval-- no further work up -tylenol and PRN Vicodin for pain prednisone taper   Gout -prednisone taper  Hypokalemia -repleted along with Mg -stop lokelma -BMP in 1 week after d/c  CKD -Cr stable at 2  Anemia of CD -hgb stable  DM -suspect will be uncontrolled while on steroids - carb mod diet  obesity Body mass index is 33.48 kg/m.      Medical Consultants:   cardiology   Discharge Exam:   Vitals:   10/04/17 0833 10/04/17 1206  BP: (!) 145/76 123/65  Pulse: 75 62  Resp:  16  Temp:  97.8 F (36.6 C)  SpO2:  98%   Vitals:   10/04/17 0056 10/04/17 0341 10/04/17 0833 10/04/17 1206  BP: (!) 148/68 (!) 147/67 (!) 145/76 123/65  Pulse: 65 66 75 62  Resp: _0 Temp: 98 F (36.7 C) 97.9 F (36.6 C)  97.8 F (36.6 C)  TempSrc: Oral Oral  Oral  SpO2: 100% 93%  98%  Weight:  95 kg (209 lb  7 oz)    Height:        General exam: Appears calm and comfortable. Able to work with PT   The results of significant diagnostics from this hospitalization (including imaging, microbiology, ancillary and laboratory) are listed below for reference.     Procedures and Diagnostic Studies:   Ct Head Wo Contrast  Result Date: 10/02/2017 CLINICAL DATA:  Seizure EXAM: CT HEAD WITHOUT CONTRAST TECHNIQUE: Contiguous axial images were obtained  from the base of the skull through the vertex without intravenous contrast. COMPARISON:  None. FINDINGS: Brain: There is atrophy and chronic small vessel disease changes. No acute intracranial abnormality. Specifically, no hemorrhage, hydrocephalus, mass lesion, acute infarction, or significant intracranial injury. Vascular: No hyperdense vessel or unexpected calcification. Skull: No acute calvarial abnormality. Sinuses/Orbits: Visualized paranasal sinuses and mastoids clear. Orbital soft tissues unremarkable. Other: None IMPRESSION: No acute intracranial abnormality. Atrophy, chronic microvascular disease. Electronically Signed   By: Rolm Baptise M.D.   On: 10/02/2017 17:53   Dg Foot 2 Views Right  Result Date: 10/02/2017 CLINICAL DATA:  Pain EXAM: RIGHT FOOT - 2 VIEW COMPARISON:  None. FINDINGS: Diffuse vascular calcifications. Calcaneal spurs. Degenerative changes in the hindfoot. No acute bony abnormality. Specifically, no fracture, subluxation, or dislocation. IMPRESSION: No acute bony abnormality. Electronically Signed   By: Rolm Baptise M.D.   On: 10/02/2017 21:15     Labs:   Basic Metabolic Panel: Recent Labs  Lab 10/02/17 1433 10/02/17 1926  10/03/17 0615 10/03/17 1050 10/04/17 0546  NA 139 137  --  138  --  136  K 2.2* 2.8*   < > 3.1*  --  3.4*  CL 98 97*  --  98  --  101  CO2 29  --   --  25  --  27  GLUCOSE 163* 188*  --  206*  --  157*  BUN 29* 29*  --  32*  --  47*  CREATININE 2.28* 2.20*  --  2.02*  --  2.50*  CALCIUM 9.2  --   --  9.1  --  8.9  MG  --   --   --   --  1.8  --    < > = values in this interval not displayed.   GFR Estimated Creatinine Clearance: 21.9 mL/min (A) (by C-G formula based on SCr of 2.5 mg/dL (H)). Liver Function Tests: No results for input(s): AST, ALT, ALKPHOS, BILITOT, PROT, ALBUMIN in the last 168 hours. No results for input(s): LIPASE, AMYLASE in the last 168 hours. No results for input(s): AMMONIA in the last 168 hours. Coagulation  profile No results for input(s): INR, PROTIME in the last 168 hours.  CBC: Recent Labs  Lab 10/02/17 1433 10/02/17 1926 10/03/17 0615  WBC 8.6  --  8.1  HGB 8.1* 8.5* 8.9*  HCT 25.7* 25.0* 28.0*  MCV 89.9  --  87.8  PLT 252  --  260   Cardiac Enzymes: Recent Labs  Lab 10/02/17 1433 10/03/17 0017 10/03/17 0615 10/03/17 1050  TROPONINI 0.05* 0.03* 0.03* 0.03*   BNP: Invalid input(s): POCBNP CBG: Recent Labs  Lab 10/03/17 1649 10/03/17 2029 10/03/17 2119 10/04/17 0815 10/04/17 1207  GLUCAP 408* 395* 248* 159* 181*   D-Dimer No results for input(s): DDIMER in the last 72 hours. Hgb A1c Recent Labs    10/02/17 1934  HGBA1C 6.9*   Lipid Profile No results for input(s): CHOL, HDL, LDLCALC, TRIG, CHOLHDL, LDLDIRECT in the last 72 hours. Thyroid function studies  No results for input(s): TSH, T4TOTAL, T3FREE, THYROIDAB in the last 72 hours.  Invalid input(s): FREET3 Anemia work up [employer]    10/02/17 2208  FERRITIN 405*  TIBC 193*  IRON 18*  RETICCTPCT 1.0   Microbiology No results found for this or any previous visit (from the past 240 hour(s)).   Discharge Instructions:   Discharge Instructions    Diet - low sodium heart healthy   Complete by:  As directed    Diet Carb Modified   Complete by:  As directed    Discharge instructions   Complete by:  As directed    Resume prednisone in the AM at 30 mg x 1 day then 20 mg x 2 days then 10 mg x 2 days---- prednisone will raise your blood sugars so be sure to follow a diabetic/low sugar diet to avoid high blood sugars   Increase activity slowly   Complete by:  As directed      Allergies as of 10/04/2017      Reactions   Lisinopril    REACTION: cough      Medication List    STOP taking these medications   LOKELMA 10 g Pack packet Generic drug:  sodium zirconium cyclosilicate     TAKE these medications   acetaminophen 325 MG tablet Commonly known as:  TYLENOL Take 2 tablets (650 mg total)  by mouth every 6 (six) hours as needed for mild pain.   allopurinol 100 MG tablet Commonly known as:  ZYLOPRIM Take 2 tablets (200 mg total) by mouth daily.   amLODipine 10 MG tablet Commonly known as:  NORVASC TAKE 1 TABLET EVERY DAY   atenolol 50 MG tablet Commonly known as:  TENORMIN TAKE 1 TABLET BY MOUTH ONCE DAILY   atorvastatin 40 MG tablet Commonly known as:  LIPITOR TAKE 1 TABLET EVERY DAY   B-D SINGLE USE SWABS REGULAR Pads Use as directed. Dx E11.9   blood glucose meter kit and supplies Kit Dispense based on patient and insurance preference. Use up to four times daily as directed. (FOR ICD-9 250.00, 250.01).   cyanocobalamin 2000 MCG tablet Take 1 tablet (2,000 mcg total) by mouth daily.   glucose blood test strip Commonly known as:  TRUE METRIX BLOOD GLUCOSE TEST 1 each by Other route 2 (two) times daily. Use to check blood sugars twice a day. Dx E11.9   HYDROcodone-acetaminophen 5-325 MG tablet Commonly known as:  NORCO/VICODIN Take 1 tablet by mouth every 4 (four) hours as needed for severe pain.   predniSONE 10 MG tablet Commonly known as:  DELTASONE Take 10-40 mg by mouth See admin instructions. Day 1-2 take 32m, day 3-4 take 393m day 5-6 take 2082mday 7-8 take 59m33ml finished then stop.   TRUE METRIX AIR GLUCOSE METER w/Device Kit 1 Device by Does not apply route daily. Use to check blood sugars twice a day. Dx E11.9   TRUE METRIX LEVEL 1 Low Soln Use as directed Dx. E11.9   TRUEPLUS LANCETS 33G Misc Use to help check blood sugars twice a day. Dx E11.9      Follow-up Information    BurnBinnie Rail Follow up in 1 week(s).   Specialty:  Internal Medicine Contact information: [phone]        Time coordinating discharge: 25 min obs  Signed:  [facility] 10/04/2017, 2:58 PM

## 2017-10-04 NOTE — Evaluation (Signed)
Physical Therapy Evaluation Patient Details Name: Erin Liu MRN: [id number] DOB: [birth year] Today's Date: 10/04/2017   History of Present Illness   Erin Liu is a 77 y.o. female with history h/o diabetes, hypertension, hyperlipidemia, gout and chronic kidney disease presented after a syncopal episode.  Clinical Impression  Pt admitted with above. Pt with delayed processing, mild confusion, decreased insight to safety and deficits and is at increased falls risk. Unsure of accuracy of patient's report of PLOF. Pt states she amb without AD PTA however pt now requires minA and use of RW for safe mobility. Pt states her son is always home as he is on disability. Pt unsafe to return home unless 24/7 assist is available. Acute PT to con't to follow.    Follow Up Recommendations Home health PT;Supervision/Assistance - 24 hour(will need SNF if 24/7 assist isn't available)    Equipment Recommendations  None recommended by PT    Recommendations for Other Services OT consult     Precautions / Restrictions Precautions Precautions: Fall Precaution Comments: poor memory Restrictions Weight Bearing Restrictions: No      Mobility  Bed Mobility Overal bed mobility: Needs Assistance Bed Mobility: Supine to Sit     Supine to sit: Min assist     General bed mobility comments: HOB elevated, increased time, verbal and tactile directional cues  Transfers Overall transfer level: Needs assistance Equipment used: Rolling walker (2 wheeled) Transfers: Sit to/from Stand Sit to Stand: Min assist         General transfer comment: minA for initial power up, verbal cues to push up from bed not pull up on walker, increased time  Ambulation/Gait Ambulation/Gait assistance: Min assist Gait Distance (Feet): 120 Feet Assistive device: Rolling walker (2 wheeled) Gait Pattern/deviations: Step-through pattern;Decreased stride length;Shuffle;Trunk flexed Gait velocity: slow Gait velocity  interpretation: <1.8 ft/sec, indicate of risk for recurrent falls General Gait Details: pt with short shuffled steps, increased UE support on RW, minA for walker management during turns and to stay in walker,  Stairs            Wheelchair Mobility    Modified Rankin (Stroke Patients Only)       Balance Overall balance assessment: Needs assistance Sitting-balance support: Feet supported;Bilateral upper extremity supported Sitting balance-Leahy Scale: Fair Sitting balance - Comments: pt unable to don socks at EOB   Standing balance support: Bilateral upper extremity supported Standing balance-Leahy Scale: Poor Standing balance comment: requires UE support                             Pertinent Vitals/Pain Pain Assessment: 0-10 Pain Score: 8  Pain Location: foot, at gout flair up Pain Descriptors / Indicators: Aching Pain Intervention(s): Premedicated before session    Home Living Family/patient expects to be discharged to:: Private residence Living Arrangements: Children(son) Available Help at Discharge: Family;Available 24 hours/day Type of Home: House Home Access: Stairs to enter Entrance Stairs-Rails: Right Entrance Stairs-Number of Steps: 2 Home Layout: Two level;Able to live on main level with bedroom/bathroom Home Equipment: Gilford Rile - 2 wheels;Shower seat;Wheelchair - manual      Prior Function Level of Independence: Independent         Comments: pt reports daughter does their grocery shopping but she is able to dress and bath herself and typically amb without AD     Hand Dominance   Dominant Hand: Right    Extremity/Trunk Assessment   Upper Extremity Assessment Upper Extremity  Assessment: Generalized weakness    Lower Extremity Assessment Lower Extremity Assessment: Generalized weakness    Cervical / Trunk Assessment Cervical / Trunk Assessment: Kyphotic  Communication   Communication: No difficulties  Cognition  Arousal/Alertness: Awake/alert Behavior During Therapy: Flat affect Overall Cognitive Status: Impaired/Different from baseline Area of Impairment: Orientation;Attention;Memory;Following commands;Safety/judgement;Awareness;Problem solving                 Orientation Level: Disoriented to;Time(provided patients cues for date) Current Attention Level: Sustained Memory: Decreased short-term memory Following Commands: Follows one step commands with increased time Safety/Judgement: Decreased awareness of deficits;Decreased awareness of safety Awareness: Emergent Problem Solving: Slow processing;Decreased initiation;Difficulty sequencing;Requires verbal cues;Requires tactile cues General Comments: pt very slow to respond, pt with hard time processing commands,, poor comprehension, poor memory, unsure of accuracy of PLOF report      General Comments General comments (skin integrity, edema, etc.): pt with mild swelling in feet    Exercises     Assessment/Plan    PT Assessment Patient needs continued PT services  PT Problem List Decreased strength;Decreased activity tolerance;Decreased balance;Decreased mobility;Decreased coordination;Decreased cognition;Decreased knowledge of use of DME;Decreased safety awareness       PT Treatment Interventions DME instruction;Gait training;Stair training;Functional mobility training;Therapeutic activities;Therapeutic exercise;Balance training    PT Goals (Current goals can be found in the Care Plan section)  Acute Rehab PT Goals Patient Stated Goal: home today PT Goal Formulation: With patient Time For Goal Achievement: 10/18/17 Potential to Achieve Goals: Good    Frequency Min 3X/week   Barriers to discharge        Co-evaluation               AM-PAC PT "6 Clicks" Daily Activity  Outcome Measure Difficulty turning over in bed (including adjusting bedclothes, sheets and blankets)?: Unable Difficulty moving from lying on back to  sitting on the side of the bed? : Unable Difficulty sitting down on and standing up from a chair with arms (e.g., wheelchair, bedside commode, etc,.)?: Unable Help needed moving to and from a bed to chair (including a wheelchair)?: A Little Help needed walking in hospital room?: A Little Help needed climbing 3-5 steps with a railing? : A Lot 6 Click Score: 11    End of Session Equipment Utilized During Treatment: Gait belt Activity Tolerance: Patient tolerated treatment well Patient left: in chair;with call bell/phone within reach;with chair alarm set Nurse Communication: Mobility status PT Visit Diagnosis: Unsteadiness on feet (R26.81)    Time: 6295-2841 PT Time Calculation (min) (ACUTE ONLY): 23 min   Charges:   PT Evaluation $PT Eval Moderate Complexity: 1 Mod PT Treatments $Gait Training: 8-22 mins        Kittie Plater, PT, DPT Pager #: [phone] Office #: [phone]   [place] 10/04/2017, 1:33 PM

## 2017-10-04 NOTE — Care Management Note (Signed)
Case Management Note  Patient Details  Name: Erin Liu MRN: [id number] Date of Birth: 04-06-1940  Subjective/Objective:                Spoke to patient and family. They would like to use [facility] for home health services. Referral made to [facility]. No further CM needs.     Action/Plan:   Expected Discharge Date:  10/04/17               Expected Discharge Plan:  [place]  In-House Referral:     Discharge planning Services  CM Consult  Post Acute Care Choice:  Home Health Choice offered to:  Patient  DME Arranged:    DME Agency:     HH Arranged:  PT, OT HH Agency:  [facility]  Status of Service:  Completed, signed off  If discussed at [place] of Stay Meetings, dates discussed:    Additional Comments:  Carles Collet, RN 10/04/2017, 3:35 PM

## 2017-10-04 NOTE — Progress Notes (Signed)
RN rounded on pt. Pt states she does not need anything at this time. [name] family members request water for themselves. RN took water to pt's three family members.

## 2017-10-04 NOTE — Discharge Instructions (Signed)
24 hour supervision by family as high fall risk Bowel regimen while on pain medications-- they can cause constipation-- colase/miralax/etc

## 2017-10-04 NOTE — Care Management Obs Status (Signed)
[place] NOTIFICATION   Patient Details  Name: JOZELYN KUWAHARA MRN: [id number] Date of Birth: March 28, 1940   Medicare Observation Status Notification Given:  Yes    Carles Collet, RN 10/04/2017, 1:43 PM

## 2017-10-05 ENCOUNTER — Telehealth: Payer: Self-pay | Admitting: *Deleted

## 2017-10-05 NOTE — Telephone Encounter (Signed)
Yes, please change

## 2017-10-05 NOTE — Telephone Encounter (Signed)
Change appt notes.Marland KitchenJohny Liu

## 2017-10-05 NOTE — Telephone Encounter (Signed)
Transition Care Management Follow-up Telephone Call   Date discharged? 10/04/17   How have you been since you were released from the hospital? Spoke w/daughter Erin Liu) she states mom is doing alright   Do you understand why you were in the hospital? YES   Do you understand the discharge instructions? YES   Where were you discharged to? Home   Items Reviewed:  Medications reviewed: YES, not taking the amlodipine   Allergies reviewed: YES  Dietary changes reviewed: YES  Referrals reviewed: No referral needed   Functional Questionnaire:   Activities of Daily Living (ADLs):   She states she are independent in the following: bathing and hygiene, feeding, continence, grooming and toileting States they require assistance with the following: ambulation and dressing   Any transportation issues/concerns?: NO   Any patient concerns? NO   Confirmed importance and date/time of follow-up visits scheduled YES, pt had already had a 6 month appt scheduled for 10/10/17, and due to transportation need to keep the same appt time  Provider Appointment booked with Dr. Quay Burow  Confirmed with patient if condition begins to worsen call PCP or go to the ER.  Patient was given the office number and encouraged to call back with question or concerns.  : YES

## 2017-10-05 NOTE — Telephone Encounter (Signed)
Pt was d/c charge from [place] on yesterday. Called pt for TCM call per daughter mother has appt for 6 month f/u on Mon, and needed to keep same day/time due to transportation. Do you want me to change to [facility] f/u to be able to charge for TCM.Marland KitchenJohny Chess

## 2017-10-06 ENCOUNTER — Telehealth: Payer: Self-pay | Admitting: Internal Medicine

## 2017-10-06 DIAGNOSIS — H409 Unspecified glaucoma: Secondary | ICD-10-CM | POA: Diagnosis not present

## 2017-10-06 DIAGNOSIS — E1142 Type 2 diabetes mellitus with diabetic polyneuropathy: Secondary | ICD-10-CM | POA: Diagnosis not present

## 2017-10-06 DIAGNOSIS — D649 Anemia, unspecified: Secondary | ICD-10-CM | POA: Diagnosis not present

## 2017-10-06 DIAGNOSIS — R55 Syncope and collapse: Secondary | ICD-10-CM | POA: Diagnosis not present

## 2017-10-06 DIAGNOSIS — N183 Chronic kidney disease, stage 3 (moderate): Secondary | ICD-10-CM | POA: Diagnosis not present

## 2017-10-06 DIAGNOSIS — M10071 Idiopathic gout, right ankle and foot: Secondary | ICD-10-CM | POA: Diagnosis not present

## 2017-10-06 DIAGNOSIS — E1122 Type 2 diabetes mellitus with diabetic chronic kidney disease: Secondary | ICD-10-CM | POA: Diagnosis not present

## 2017-10-06 DIAGNOSIS — I129 Hypertensive chronic kidney disease with stage 1 through stage 4 chronic kidney disease, or unspecified chronic kidney disease: Secondary | ICD-10-CM | POA: Diagnosis not present

## 2017-10-06 NOTE — Telephone Encounter (Signed)
Copied from [place] [id number]. Topic: Quick Communication - See Telephone Encounter >> Oct 06, 2017  4:42 PM Percell Belt A wrote: CRM for notification. See Telephone encounter for: 10/06/17. Amber with [facility] [phone] Need verbals for PT Balance and walking  2 week 4

## 2017-10-07 ENCOUNTER — Encounter (HOSPITAL_COMMUNITY): Payer: Medicare HMO

## 2017-10-07 ENCOUNTER — Telehealth: Payer: Self-pay | Admitting: Internal Medicine

## 2017-10-07 DIAGNOSIS — M10071 Idiopathic gout, right ankle and foot: Secondary | ICD-10-CM | POA: Diagnosis not present

## 2017-10-07 DIAGNOSIS — D649 Anemia, unspecified: Secondary | ICD-10-CM | POA: Diagnosis not present

## 2017-10-07 DIAGNOSIS — N183 Chronic kidney disease, stage 3 (moderate): Secondary | ICD-10-CM | POA: Diagnosis not present

## 2017-10-07 DIAGNOSIS — I129 Hypertensive chronic kidney disease with stage 1 through stage 4 chronic kidney disease, or unspecified chronic kidney disease: Secondary | ICD-10-CM | POA: Diagnosis not present

## 2017-10-07 DIAGNOSIS — H409 Unspecified glaucoma: Secondary | ICD-10-CM | POA: Diagnosis not present

## 2017-10-07 DIAGNOSIS — R55 Syncope and collapse: Secondary | ICD-10-CM | POA: Diagnosis not present

## 2017-10-07 DIAGNOSIS — E1122 Type 2 diabetes mellitus with diabetic chronic kidney disease: Secondary | ICD-10-CM | POA: Diagnosis not present

## 2017-10-07 DIAGNOSIS — E1142 Type 2 diabetes mellitus with diabetic polyneuropathy: Secondary | ICD-10-CM | POA: Diagnosis not present

## 2017-10-07 NOTE — Telephone Encounter (Signed)
Left detailed message giving verbal orders for below.  

## 2017-10-07 NOTE — Telephone Encounter (Signed)
Copied from [place] [phone]. Topic: General - Other >> Oct 07, 2017 11:41 AM Lennox Solders wrote: Reason for YTR:[id number] OT [facility] is calling and needs verbal order for OT for twice a wk for 2 wk and then once a wk for 2 wk

## 2017-10-07 NOTE — Telephone Encounter (Signed)
Left detailed message with Amber @ [place] giving verbal orders for below.

## 2017-10-09 NOTE — Patient Instructions (Addendum)
  Test(s) ordered today. Your results will be released to [place] (or called to you) after review, usually within 72hours after test completion. If any changes need to be made, you will be notified at that same time.  Medications reviewed and updated.  No changes recommended at this time.   If your swelling does not improve please call your kidney doctor.  If your sugars remain high please call or come in.    Please followup in 3 months

## 2017-10-09 NOTE — Progress Notes (Signed)
Subjective:    Patient ID: Erin Liu, female    DOB: Dec 28, 1940, 77 y.o.   MRN: [id number]  HPI The patient is here for follow up.  Admitted 10/02/17 - 10/04/17 for syncope.  She had a gout flare one week prior.  She tried otc creams and epsom salts and the pain worsened.  She became bedbound.  She went to urgent care that morning and was prescribed prednisone.  She took 40 mg on the way home.   She was sitting on her rollator at home with family around and she started shaking in her upper extremities and fell backwards losing consciousness for a few minutes.  Patient denied sweating, palpitations, chest pain prior to the episode.  There was no incontinence, typical tonic-clonic movements or tongue biting.  ED work up showed hypokalemia, mildly elevated troponin likely related to CKD.  Anemia and Cr at baseline.    Hospital course:  Vasovagal syncope related to pain: Cardiology consulted and no further w/u needed Ct of head showed no acute intracranial abnormality, atrophy and chronic microvascular dz  Gout: Foot Xray - no acute bony abnormality Tylenol and vicodin prn for pain  Prednisone taper   Hypokalemia: repleted along with Mg Stop lokelma BMP 1 week after d/c  CKD, anemia: Stable  DM: Advised low carb diet hospitalist discussed with her that sugars would be high while on prednisone  Gout, right foot:  She finished the prednisone yesterday.  She denies pain and has not needed any pain medication for a couple of days.  She still has swelling in the right foot.  She also states her left thumb was swollen and painful while her foot was and it has improved - they wondered if she had gout there as well.  It is no longer tender.  She has had gout in her hand previously.  She is doing PT.    Tremor in hands;  It occurs randomly.  It is not new.  She notices it more, but does not think it is worse.  She denies a family history.  It does not inhibit her from doing anything.      Diabetes:  She is compliant with a low carb/sugar diet.  She has checked her sugar occasionally since being home.  It was 291 one day, and then a couple of days ago it was < 200.   She did not check it yesterday or today.  She diabetes is diet controlled.    Syncope:  She denies any chest pain, palpitations, SOB, lightheadedness/dizziness since being discharged.  She is eating and drinking normally.    Hypertension: She is taking her medication daily. She is compliant with a low sodium diet.    CKD, anemia:  She has follow up with her kidney doctor next month she believes.  She is not taking the lokelma.  Urinary incontinence: She is experiencing urinary incontinence.  This is not new, but it has gotten slightly worse.  She does wear a pad or depends and that does help.  She denies stress incontinence-she just cannot make it to the bathroom in time.  Medications and allergies reviewed with patient and updated if appropriate.  Patient Active Problem List   Diagnosis Date Noted  . Syncope 10/02/2017  . 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 B12 deficiency 04/11/2017  . Left flank pain 08/30/2015  . Cough 08/26/2015  . Lumbago 02/25/2015  . Essential tremor   . Glaucoma 01/08/2010  .  Chronic kidney disease (CKD), stage IV (severe) ([place]) 12/10/2008  . DM (diabetes mellitus), secondary, uncontrolled, with renal complications ([place]) 70/62/3762  . Diabetic neuropathy ([place]) 02/22/2006  . Dyslipidemia 02/22/2006  . OBESITY 02/22/2006  . 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 CARPAL TUNNEL SYNDROME, BILATERAL 02/22/2006  . Essential hypertension 02/22/2006  . CONSTIPATION 02/22/2006    Current Outpatient Medications on File Prior to Visit  Medication Sig Dispense Refill  . acetaminophen (TYLENOL) 325 MG tablet Take 2 tablets (650 mg total) by mouth every 6 (six) hours as needed for mild pain.    . Alcohol Swabs (B-D SINGLE USE SWABS REGULAR) PADS Use as directed. Dx E11.9 200  each 2  . allopurinol (ZYLOPRIM) 100 MG tablet Take 2 tablets (200 mg total) by mouth daily. 60 tablet 6  . amLODipine (NORVASC) 10 MG tablet TAKE 1 TABLET EVERY DAY 90 tablet 2  . atenolol (TENORMIN) 50 MG tablet TAKE 1 TABLET BY MOUTH ONCE DAILY 30 tablet 5  . atorvastatin (LIPITOR) 40 MG tablet TAKE 1 TABLET EVERY DAY 90 tablet 2  . Blood Glucose Calibration (TRUE METRIX LEVEL 1) Low SOLN Use as directed Dx. E11.9 3 each 0  . blood glucose meter kit and supplies KIT Dispense based on patient and insurance preference. Use up to four times daily as directed. (FOR ICD-9 250.00, 250.01). 1 each 0  . Blood Glucose Monitoring Suppl (TRUE METRIX AIR GLUCOSE METER) w/Device KIT 1 Device by Does not apply route daily. Use to check blood sugars twice a day. Dx E11.9 1 kit 0  . cyanocobalamin 2000 MCG tablet Take 1 tablet (2,000 mcg total) by mouth daily. 90 tablet 3  . glucose blood (TRUE METRIX BLOOD GLUCOSE TEST) test strip 1 each by Other route 2 (two) times daily. Use to check blood sugars twice a day. Dx E11.9 200 each 2  . HYDROcodone-acetaminophen (NORCO/VICODIN) 5-325 MG tablet Take 1 tablet by mouth every 4 (four) hours as needed for severe pain. 15 tablet 0  . TRUEPLUS LANCETS 33G MISC Use to help check blood sugars twice a day. Dx E11.9 200 each 2   No current facility-administered medications on file prior to visit.     Past Medical History:  Diagnosis Date  . ANEMIA, NORMOCYTIC   . BREAST CYST, HX OF   . CARPAL TUNNEL SYNDROME, BILATERAL   . DIABETES MELLITUS, TYPE II   . Glaucoma   . HYPERLIPIDEMIA   . HYPERTENSION   . OBESITY   . PERIPHERAL NEUROPATHY   . RENAL FAILURE, CHRONIC     Past Surgical History:  Procedure Laterality Date  . CATARACT EXTRACTION BILATERAL W/ ANTERIOR VITRECTOMY  05/2010  . COLONOSCOPY     [facility]/7-8 years    Social History   Socioeconomic History  . Marital status: Widowed    Spouse name: Not on file  . Number of children: Not on file  . Years  of education: Not on file  . Highest education level: Not on file  Occupational History  . Not on file  Social Needs  . Financial resource strain: Not on file  . Food insecurity:    Worry: Not on file    Inability: Not on file  . Transportation needs:    Medical: Not on file    Non-medical: Not on file  Tobacco Use  . Smoking status: Never Smoker  . Smokeless tobacco: Never Used  Substance and Sexual Activity  . Alcohol use: No  . Drug use: No  . Sexual  activity: Not on file  Lifestyle  . Physical activity:    Days per week: Not on file    Minutes per session: Not on file  . Stress: Not on file  Relationships  . Social connections:    Talks on phone: Not on file    Gets together: Not on file    Attends religious service: Not on file    Active member of club or organization: Not on file    Attends meetings of clubs or organizations: Not on file    Relationship status: Not on file  Other Topics Concern  . 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 Arthritis Son   . Alcohol abuse Son   . Diabetes Son   . Hypertension Son   . Colon cancer Son 34  . Heart attack Mother        Died age 13s  . Heart attack Father        No details 32s  . Heart disease Sister        Slight heart attack, died age 71    Review of Systems  Constitutional: Negative for appetite change, chills and fever.  Respiratory: Negative for cough, shortness of breath and wheezing.   Cardiovascular: Negative for chest pain, palpitations and leg swelling (foot swelling only).  Gastrointestinal: Negative for abdominal pain and nausea.  Genitourinary: Negative for dysuria and hematuria.       Incontinence - can't make it to the bathroom - wears depends  Skin: Negative for color change.  Neurological: Negative for dizziness and light-headedness.       Objective:   Vitals:   10/10/17 0809  BP: (!)  148/64  Pulse: 70  Resp: 16  Temp: 98 F (36.7 C)  SpO2: 95%   BP Readings from Last 3 Encounters:  10/10/17 (!) 148/64  10/04/17 123/65  07/12/17 126/60   Wt Readings from Last 3 Encounters:  10/10/17 206 lb (93.4 kg)  10/04/17 209 lb 7 oz (95 kg)  07/12/17 211 lb (95.7 kg)   Body mass index is 33.25 kg/m.   Physical Exam    Constitutional: Appears well-developed and well-nourished. No distress.  HENT:  Head: Normocephalic and atraumatic.  Neck: Neck supple. No tracheal deviation present. No thyromegaly present.  No cervical lymphadenopathy Cardiovascular: Normal rate, regular rhythm and normal heart sounds.   2/6 end systolic murmur heard. No carotid bruit .  1 + b/l LE pitting edema Pulmonary/Chest: Effort normal and breath sounds normal. No respiratory distress. No has no wheezes. No rales.  Abdomen: soft, NT, ND Neuro: resting tremor b/l hands Skin: Skin is warm and dry. Not diaphoretic.  Psychiatric: Normal mood and affect. Behavior is normal.    ECHOCARDIOGRAM COMPLETE                            *[facility]*                         [address]                        [place], [place] [address]  [phone]  ------------------------------------------------------------------- Transthoracic Echocardiography  Patient:    Shannan, Slinker MR #:       [id number] Study Date: 10/03/2017 Gender:     F Age:        79 Height:     167.6 cm Weight:     94.1 kg BSA:        2.13 m^2 Pt. Status: Room:       [facility], [place]  PERFORMING   [facility], Inpatient  SONOGRAPHER  [facility]  ADMITTING    Kamineni, Neelima  ORDERING     Kamineni, Neelima  REFERRING    Kamineni, Neelima  cc:  ------------------------------------------------------------------- LV EF: 50% -   55%  ------------------------------------------------------------------- Indications:       Syncope 780.2.  ------------------------------------------------------------------- History:   PMH:  Chronic kidney disease  Risk factors:  Diabetes mellitus. Dyslipidemia.  ------------------------------------------------------------------- Study Conclusions  - Left ventricle: The cavity size was normal. There was mild   concentric hypertrophy. Systolic function was normal. The   estimated ejection fraction was in the range of 50% to 55%. Wall   motion was normal; there were no regional wall motion   abnormalities. There was an increased relative contribution of   atrial contraction to ventricular filling. Doppler parameters are   consistent with abnormal left ventricular relaxation (grade 1   diastolic dysfunction). Doppler parameters are consistent with   high ventricular filling pressure. - Ventricular septum: Septal motion showed moderate paradox. These   changes are consistent with intraventricular conduction delay. - Mitral valve: Calcified annulus. There was mild regurgitation. - Tricuspid valve: There was mild regurgitation. - Pulmonary arteries: Systolic pressure could not be accurately   estimated.  ------------------------------------------------------------------- Study data:  No prior study was available for comparison.  Study status:  Routine.  Procedure:  The patient reported no pain pre or post test. Transthoracic echocardiography. Image quality was adequate.  Study completion:  There were no complications. Transthoracic echocardiography.  M-mode, complete 2D, spectral Doppler, and color Doppler.  Birthdate:  Patient birthdate: 1940/11/01.  Age:  Patient is 77 yr old.  Sex:  Gender: female. BMI: 33.5 kg/m^2.  Blood pressure:     151/69  Patient status: Inpatient.  Study date:  Study date: 10/03/2017. Study time: 09:47 AM.  Location:   Bedside.  -------------------------------------------------------------------  ------------------------------------------------------------------- Left ventricle:  The cavity size was normal. There was mild concentric hypertrophy. Systolic function was normal. The estimated ejection fraction was in the range of 50% to 55%. Wall motion was normal; there were no regional wall motion abnormalities. There was an increased relative contribution of atrial contraction to ventricular filling. Doppler parameters are consistent with abnormal left ventricular relaxation (grade 1 diastolic dysfunction). Doppler parameters are consistent with high ventricular filling pressure.  ------------------------------------------------------------------- Aortic valve:   Trileaflet; normal thickness leaflets. Mobility was not restricted.  Doppler:  Transvalvular velocity was within the normal range. There was no stenosis. There was no regurgitation.   ------------------------------------------------------------------- Aorta:  Aortic root: The aortic root was normal in size.  ------------------------------------------------------------------- Mitral valve:   Calcified annulus. Mobility was not restricted. Doppler:  Transvalvular velocity was within the normal range. There was no evidence for stenosis. There was mild regurgitation. Valve area by pressure half-time: 4.78 cm^2. Indexed valve area by pressure half-time: 2.25 cm^2/m^2.    Peak gradient (D): 4 mm Hg.   ------------------------------------------------------------------- Left atrium:  The atrium was normal in size.  ------------------------------------------------------------------- Right ventricle:  The cavity size was  normal. Wall thickness was normal. Systolic function was normal.  ------------------------------------------------------------------- Ventricular septum:   Septal motion showed moderate paradox. These changes are consistent  with intraventricular conduction delay.  ------------------------------------------------------------------- Pulmonic valve:    Structurally normal valve.   Cusp separation was normal.  Doppler:  Transvalvular velocity was within the normal range. There was no evidence for stenosis. There was no regurgitation.  ------------------------------------------------------------------- Tricuspid valve:   Structurally normal valve.    Doppler: Transvalvular velocity was within the normal range. There was mild regurgitation.  ------------------------------------------------------------------- Pulmonary artery:   The main pulmonary artery was normal-sized. Systolic pressure could not be accurately estimated.  ------------------------------------------------------------------- Right atrium:  The atrium was normal in size.  ------------------------------------------------------------------- Pericardium:  There was no pericardial effusion.  ------------------------------------------------------------------- Systemic veins: Inferior vena cava: The vessel was normal in size.  ------------------------------------------------------------------- Measurements   Left ventricle                         Value          Reference  LV ID, ED, PLAX chordal                44    mm       43 - 52  LV ID, ES, PLAX chordal                32    mm       23 - 38  LV fx shortening, PLAX chordal (L)     27    %        >=29  LV PW thickness, ED                    11    mm       ----------  IVS/LV PW ratio, ED                    1              <=1.3  Stroke volume, 2D                      91    ml       ----------  Stroke volume/bsa, 2D                  43    ml/m^2   ----------  LV e&', lateral                         6.85  cm/s     ----------  LV E/e&', lateral                       15.33          ----------  LV e&', medial                          5.22  cm/s     ----------  LV E/e&', medial                         20.11          ----------  LV e&', average                         6.04  cm/s     ----------  LV E/e&', average                       17.4           ----------    Ventricular septum                     Value          Reference  IVS thickness, ED                      11    mm       ----------    LVOT                                   Value          Reference  LVOT ID, S                             21    mm       ----------  LVOT area                              3.46  cm^2     ----------  LVOT peak velocity, S                  122   cm/s     ----------  LVOT mean velocity, S                  82.7  cm/s     ----------  LVOT VTI, S                            26.4  cm       ----------  LVOT peak gradient, S                  6     mm Hg    ----------    Aorta                                  Value          Reference  Aortic root ID, ED                     28    mm       ----------    Left atrium                            Value          Reference  LA ID, A-P, ES                         35    mm       ----------  LA ID/bsa, A-P                         1.64  cm/m^2   <=2.2  LA volume, ES, 1-p A4C  70.2  ml       ----------  LA volume/bsa, ES, 1-p A4C             33    ml/m^2   ----------  LA volume, ES, 1-p A2C                 57.2  ml       ----------  LA volume/bsa, ES, 1-p A2C             26.9  ml/m^2   ----------    Mitral valve                           Value          Reference  Mitral E-wave peak velocity            105   cm/s     ----------  Mitral A-wave peak velocity            138   cm/s     ----------  Mitral deceleration time               158   ms       150 - 230  Mitral pressure half-time              46    ms       ----------  Mitral peak gradient, D                4     mm Hg    ----------  Mitral E/A ratio, peak                 0.8            ----------  Mitral valve area, PHT, DP             4.78  cm^2     ----------  Mitral valve area/bsa, PHT, DP          2.25  cm^2/m^2 ----------    Right atrium                           Value          Reference  RA ID, S-I, ES, A4C            (H)     50.8  mm       34 - 49  RA area, ES, A4C                       19    cm^2     8.3 - 19.5  RA volume, ES, A/L                     57.6  ml       ----------  RA volume/bsa, ES, A/L                 27.1  ml/m^2   ----------    Systemic veins                         Value          Reference  Estimated CVP                          3  mm Hg    ----------    Right ventricle                        Value          Reference  TAPSE                                  21.7  mm       ----------  RV s&', lateral, S                      12.4  cm/s     ----------  Legend: (L)  and  (H)  mark values outside specified reference range.  ------------------------------------------------------------------- Prepared and Electronically Authenticated by  Fransico Him, MD 2019-07-29T10:12:53   Lab Results  Component Value Date   WBC 8.1 10/03/2017   HGB 8.9 (L) 10/03/2017   HCT 28.0 (L) 10/03/2017   PLT 260 10/03/2017   GLUCOSE 168 (H) 10/10/2017   CHOL 206 (H) 04/11/2017   TRIG 91.0 04/11/2017   HDL 68.00 04/11/2017   LDLDIRECT 230.5 09/11/2010   LDLCALC 120 (H) 04/11/2017   ALT 15 04/11/2017   AST 20 04/11/2017   NA 140 10/10/2017   K 4.1 10/10/2017   CL 102 10/10/2017   CREATININE 2.04 (H) 10/10/2017   BUN 51 (H) 10/10/2017   CO2 29 10/10/2017   TSH 1.96 04/11/2017   HGBA1C 6.9 (H) 10/02/2017   MICROALBUR 5.3 (H) 02/25/2015     Assessment & Plan:   Reviewed with patient and her family her hospital course, tests done while in the hospital and most recent blood work.  See Problem List for Assessment and Plan of chronic medical problems.

## 2017-10-10 ENCOUNTER — Ambulatory Visit (INDEPENDENT_AMBULATORY_CARE_PROVIDER_SITE_OTHER): Payer: Medicare HMO | Admitting: Internal Medicine

## 2017-10-10 ENCOUNTER — Other Ambulatory Visit (INDEPENDENT_AMBULATORY_CARE_PROVIDER_SITE_OTHER): Payer: Medicare HMO

## 2017-10-10 ENCOUNTER — Encounter: Payer: Self-pay | Admitting: Internal Medicine

## 2017-10-10 VITALS — BP 148/64 | HR 70 | Temp 98.0°F | Resp 16 | Wt 206.0 lb

## 2017-10-10 DIAGNOSIS — N184 Chronic kidney disease, stage 4 (severe): Secondary | ICD-10-CM

## 2017-10-10 DIAGNOSIS — I10 Essential (primary) hypertension: Secondary | ICD-10-CM

## 2017-10-10 DIAGNOSIS — R32 Unspecified urinary incontinence: Secondary | ICD-10-CM

## 2017-10-10 DIAGNOSIS — E1329 Other specified diabetes mellitus with other diabetic kidney complication: Secondary | ICD-10-CM | POA: Diagnosis not present

## 2017-10-10 DIAGNOSIS — R251 Tremor, unspecified: Secondary | ICD-10-CM | POA: Diagnosis not present

## 2017-10-10 DIAGNOSIS — M10371 Gout due to renal impairment, right ankle and foot: Secondary | ICD-10-CM | POA: Diagnosis not present

## 2017-10-10 DIAGNOSIS — R55 Syncope and collapse: Secondary | ICD-10-CM | POA: Diagnosis not present

## 2017-10-10 DIAGNOSIS — IMO0002 Reserved for concepts with insufficient information to code with codable children: Secondary | ICD-10-CM

## 2017-10-10 DIAGNOSIS — E1365 Other specified diabetes mellitus with hyperglycemia: Secondary | ICD-10-CM | POA: Diagnosis not present

## 2017-10-10 LAB — BASIC METABOLIC PANEL
BUN: 51 mg/dL — AB (ref 6–23)
CALCIUM: 10.2 mg/dL (ref 8.4–10.5)
CO2: 29 mEq/L (ref 19–32)
Chloride: 102 mEq/L (ref 96–112)
Creatinine, Ser: 2.04 mg/dL — ABNORMAL HIGH (ref 0.40–1.20)
GFR: 30.33 mL/min — ABNORMAL LOW (ref >60.00)
GLUCOSE: 168 mg/dL — AB (ref 70–99)
Potassium: 4.1 mEq/L (ref 3.5–5.1)
Sodium: 140 mEq/L (ref 135–145)

## 2017-10-10 LAB — GLUCOSE, POCT (MANUAL RESULT ENTRY): POC Glucose: 195 mg/dl — AB (ref 70–99)

## 2017-10-10 NOTE — Assessment & Plan Note (Signed)
Following with nephrology and has an appointment next month Had low potassium on admission which was repleted.  She had been started on lokelma by nephrology and this was discontinued BMP today to recheck renal function and potassium

## 2017-10-10 NOTE — Assessment & Plan Note (Signed)
Looks like she had right foot and left thumb gout at the same time Pain has resolved Completed prednisone taper yesterday Still has residual foot and lower leg swelling, but this is bilateral Continue allopurinol 200 mg daily

## 2017-10-10 NOTE — Assessment & Plan Note (Signed)
She is unable to make it to the bathroom in time Wearing a pad/diaper which is helping Discussed neurology referral and medication as options of treatment Also discussed physical therapy, which at this point I think she would have difficulty doing Discussed that we can do any of these options if she would like, also discussed that if she can tolerate this may be best to avoid medication Advised going to the bathroom more frequently

## 2017-10-10 NOTE — Assessment & Plan Note (Addendum)
Diet controlled Sugars elevated recently due to prednisone, recent a1c 6.9% Glucose here today 195 Monitor sugar at home closely and if sugar is persistently high may need to start insulin, but would like to avoid that because I think she is at high risk for hypoglycemia and I am not sure she would be able to do her own insulin

## 2017-10-10 NOTE — Assessment & Plan Note (Signed)
She has been experiencing bilateral hand tremor.  She states it occurs randomly and has been going on for a while No family history It does not bother her or inhibit her from doing anything at this time Discussed referral to neurology, but deferred for now

## 2017-10-10 NOTE — Assessment & Plan Note (Signed)
BP slightly elevated today, but other measure recently were good - possibly side effect from prednisone No change in meds today Has nephrology f/u next month for recheck

## 2017-10-10 NOTE — Assessment & Plan Note (Signed)
Cardiology evaluated patient - echo, ct done -  Likely vasovagal in nature due to severe pain from gout No concerning symptoms since syncopal episode No further evaluation necessary

## 2017-10-11 ENCOUNTER — Other Ambulatory Visit (HOSPITAL_COMMUNITY): Payer: Self-pay | Admitting: *Deleted

## 2017-10-11 DIAGNOSIS — M10071 Idiopathic gout, right ankle and foot: Secondary | ICD-10-CM | POA: Diagnosis not present

## 2017-10-11 DIAGNOSIS — E1122 Type 2 diabetes mellitus with diabetic chronic kidney disease: Secondary | ICD-10-CM | POA: Diagnosis not present

## 2017-10-11 DIAGNOSIS — R55 Syncope and collapse: Secondary | ICD-10-CM | POA: Diagnosis not present

## 2017-10-11 DIAGNOSIS — H409 Unspecified glaucoma: Secondary | ICD-10-CM | POA: Diagnosis not present

## 2017-10-11 DIAGNOSIS — I129 Hypertensive chronic kidney disease with stage 1 through stage 4 chronic kidney disease, or unspecified chronic kidney disease: Secondary | ICD-10-CM | POA: Diagnosis not present

## 2017-10-11 DIAGNOSIS — E1142 Type 2 diabetes mellitus with diabetic polyneuropathy: Secondary | ICD-10-CM | POA: Diagnosis not present

## 2017-10-11 DIAGNOSIS — N183 Chronic kidney disease, stage 3 (moderate): Secondary | ICD-10-CM | POA: Diagnosis not present

## 2017-10-11 DIAGNOSIS — D649 Anemia, unspecified: Secondary | ICD-10-CM | POA: Diagnosis not present

## 2017-10-12 ENCOUNTER — Encounter (HOSPITAL_COMMUNITY)
Admission: RE | Admit: 2017-10-12 | Discharge: 2017-10-12 | Disposition: A | Discharge status: Home / Self Care | Payer: Medicare HMO | Source: Ambulatory Visit | Attending: Nephrology | Admitting: Nephrology

## 2017-10-12 DIAGNOSIS — D631 Anemia in chronic kidney disease: Secondary | ICD-10-CM | POA: Diagnosis not present

## 2017-10-12 DIAGNOSIS — D649 Anemia, unspecified: Secondary | ICD-10-CM

## 2017-10-12 DIAGNOSIS — N189 Chronic kidney disease, unspecified: Secondary | ICD-10-CM | POA: Diagnosis not present

## 2017-10-12 DIAGNOSIS — H409 Unspecified glaucoma: Secondary | ICD-10-CM | POA: Diagnosis not present

## 2017-10-12 DIAGNOSIS — E1122 Type 2 diabetes mellitus with diabetic chronic kidney disease: Secondary | ICD-10-CM | POA: Diagnosis not present

## 2017-10-12 DIAGNOSIS — E1142 Type 2 diabetes mellitus with diabetic polyneuropathy: Secondary | ICD-10-CM

## 2017-10-12 DIAGNOSIS — N183 Chronic kidney disease, stage 3 (moderate): Secondary | ICD-10-CM | POA: Diagnosis not present

## 2017-10-12 DIAGNOSIS — I129 Hypertensive chronic kidney disease with stage 1 through stage 4 chronic kidney disease, or unspecified chronic kidney disease: Secondary | ICD-10-CM | POA: Diagnosis not present

## 2017-10-12 DIAGNOSIS — R55 Syncope and collapse: Secondary | ICD-10-CM | POA: Diagnosis not present

## 2017-10-12 DIAGNOSIS — M10071 Idiopathic gout, right ankle and foot: Secondary | ICD-10-CM | POA: Diagnosis not present

## 2017-10-12 MED ORDER — SODIUM CHLORIDE 0.9 % IV SOLN
510.0000 mg | INTRAVENOUS | Status: DC
  Administered 2017-10-12: 510 mg via INTRAVENOUS
  Filled 2017-10-12: qty 17

## 2017-10-13 ENCOUNTER — Telehealth: Payer: Self-pay | Admitting: Emergency Medicine

## 2017-10-13 DIAGNOSIS — R55 Syncope and collapse: Secondary | ICD-10-CM | POA: Diagnosis not present

## 2017-10-13 DIAGNOSIS — H409 Unspecified glaucoma: Secondary | ICD-10-CM | POA: Diagnosis not present

## 2017-10-13 DIAGNOSIS — I129 Hypertensive chronic kidney disease with stage 1 through stage 4 chronic kidney disease, or unspecified chronic kidney disease: Secondary | ICD-10-CM | POA: Diagnosis not present

## 2017-10-13 DIAGNOSIS — E1142 Type 2 diabetes mellitus with diabetic polyneuropathy: Secondary | ICD-10-CM | POA: Diagnosis not present

## 2017-10-13 DIAGNOSIS — N183 Chronic kidney disease, stage 3 (moderate): Secondary | ICD-10-CM | POA: Diagnosis not present

## 2017-10-13 DIAGNOSIS — R600 Localized edema: Secondary | ICD-10-CM | POA: Insufficient documentation

## 2017-10-13 DIAGNOSIS — M10071 Idiopathic gout, right ankle and foot: Secondary | ICD-10-CM | POA: Diagnosis not present

## 2017-10-13 DIAGNOSIS — E1122 Type 2 diabetes mellitus with diabetic chronic kidney disease: Secondary | ICD-10-CM | POA: Diagnosis not present

## 2017-10-13 DIAGNOSIS — D649 Anemia, unspecified: Secondary | ICD-10-CM | POA: Diagnosis not present

## 2017-10-13 MED ORDER — FUROSEMIDE 20 MG PO TABS: 20.0000 mg | ORAL_TABLET | Freq: Every day | ORAL | 3 refills | Status: DC

## 2017-10-13 NOTE — Telephone Encounter (Signed)
Spoke with pts daughter to inform.  

## 2017-10-13 NOTE — Telephone Encounter (Signed)
Start lasix 20 mg daily.   cmp on Monday.    Ordered.

## 2017-10-13 NOTE — Telephone Encounter (Signed)
Copied from [place] [id number]. Topic: Inquiry >> Oct 13, 2017  9:03 AM Pricilla Handler wrote: Reason for CRM: Patient's daughter [name] called stating that both of the patient's feet are swollen and painful. Patient wants Dr. Quay Burow to know. Patient's daughter states that Dr. Quay Burow wanted her to call and let her know. Please call [phone].      Thank You!!!

## 2017-10-14 DIAGNOSIS — R55 Syncope and collapse: Secondary | ICD-10-CM | POA: Diagnosis not present

## 2017-10-14 DIAGNOSIS — D649 Anemia, unspecified: Secondary | ICD-10-CM | POA: Diagnosis not present

## 2017-10-14 DIAGNOSIS — M10071 Idiopathic gout, right ankle and foot: Secondary | ICD-10-CM | POA: Diagnosis not present

## 2017-10-14 DIAGNOSIS — I129 Hypertensive chronic kidney disease with stage 1 through stage 4 chronic kidney disease, or unspecified chronic kidney disease: Secondary | ICD-10-CM | POA: Diagnosis not present

## 2017-10-14 DIAGNOSIS — N183 Chronic kidney disease, stage 3 (moderate): Secondary | ICD-10-CM | POA: Diagnosis not present

## 2017-10-14 DIAGNOSIS — E1142 Type 2 diabetes mellitus with diabetic polyneuropathy: Secondary | ICD-10-CM | POA: Diagnosis not present

## 2017-10-14 DIAGNOSIS — H409 Unspecified glaucoma: Secondary | ICD-10-CM | POA: Diagnosis not present

## 2017-10-14 DIAGNOSIS — E1122 Type 2 diabetes mellitus with diabetic chronic kidney disease: Secondary | ICD-10-CM | POA: Diagnosis not present

## 2017-10-17 ENCOUNTER — Other Ambulatory Visit (INDEPENDENT_AMBULATORY_CARE_PROVIDER_SITE_OTHER): Payer: Medicare HMO

## 2017-10-17 DIAGNOSIS — R600 Localized edema: Secondary | ICD-10-CM | POA: Diagnosis not present

## 2017-10-17 LAB — COMPREHENSIVE METABOLIC PANEL
ALT: 12 U/L (ref 0–35)
AST: 20 U/L (ref 0–37)
Albumin: 3.7 g/dL (ref 3.5–5.2)
Alkaline Phosphatase: 64 U/L (ref 39–117)
BUN: 40 mg/dL — AB (ref 6–23)
CHLORIDE: 99 meq/L (ref 96–112)
CO2: 28 meq/L (ref 19–32)
CREATININE: 2.94 mg/dL — AB (ref 0.40–1.20)
Calcium: 10.5 mg/dL (ref 8.4–10.5)
GFR: 19.89 mL/min — ABNORMAL LOW (ref >60.00)
Glucose, Bld: 126 mg/dL — ABNORMAL HIGH (ref 70–99)
Potassium: 4.3 mEq/L (ref 3.5–5.1)
SODIUM: 137 meq/L (ref 135–145)
Total Bilirubin: 0.8 mg/dL (ref 0.2–1.2)
Total Protein: 7.9 g/dL (ref 6.0–8.3)

## 2017-10-18 DIAGNOSIS — E1122 Type 2 diabetes mellitus with diabetic chronic kidney disease: Secondary | ICD-10-CM | POA: Diagnosis not present

## 2017-10-18 DIAGNOSIS — N183 Chronic kidney disease, stage 3 (moderate): Secondary | ICD-10-CM | POA: Diagnosis not present

## 2017-10-18 DIAGNOSIS — H409 Unspecified glaucoma: Secondary | ICD-10-CM | POA: Diagnosis not present

## 2017-10-18 DIAGNOSIS — R55 Syncope and collapse: Secondary | ICD-10-CM | POA: Diagnosis not present

## 2017-10-18 DIAGNOSIS — M10071 Idiopathic gout, right ankle and foot: Secondary | ICD-10-CM | POA: Diagnosis not present

## 2017-10-18 DIAGNOSIS — E1142 Type 2 diabetes mellitus with diabetic polyneuropathy: Secondary | ICD-10-CM | POA: Diagnosis not present

## 2017-10-18 DIAGNOSIS — I129 Hypertensive chronic kidney disease with stage 1 through stage 4 chronic kidney disease, or unspecified chronic kidney disease: Secondary | ICD-10-CM | POA: Diagnosis not present

## 2017-10-18 DIAGNOSIS — D649 Anemia, unspecified: Secondary | ICD-10-CM | POA: Diagnosis not present

## 2017-10-19 ENCOUNTER — Telehealth: Payer: Self-pay | Admitting: Internal Medicine

## 2017-10-19 DIAGNOSIS — E1142 Type 2 diabetes mellitus with diabetic polyneuropathy: Secondary | ICD-10-CM | POA: Diagnosis not present

## 2017-10-19 DIAGNOSIS — R55 Syncope and collapse: Secondary | ICD-10-CM | POA: Diagnosis not present

## 2017-10-19 DIAGNOSIS — E1122 Type 2 diabetes mellitus with diabetic chronic kidney disease: Secondary | ICD-10-CM | POA: Diagnosis not present

## 2017-10-19 DIAGNOSIS — D649 Anemia, unspecified: Secondary | ICD-10-CM | POA: Diagnosis not present

## 2017-10-19 DIAGNOSIS — M10071 Idiopathic gout, right ankle and foot: Secondary | ICD-10-CM | POA: Diagnosis not present

## 2017-10-19 DIAGNOSIS — N183 Chronic kidney disease, stage 3 (moderate): Secondary | ICD-10-CM | POA: Diagnosis not present

## 2017-10-19 DIAGNOSIS — H409 Unspecified glaucoma: Secondary | ICD-10-CM | POA: Diagnosis not present

## 2017-10-19 DIAGNOSIS — I129 Hypertensive chronic kidney disease with stage 1 through stage 4 chronic kidney disease, or unspecified chronic kidney disease: Secondary | ICD-10-CM | POA: Diagnosis not present

## 2017-10-19 NOTE — Telephone Encounter (Signed)
No DPR for our office for [facility]).

## 2017-10-19 NOTE — Telephone Encounter (Signed)
Call daughter and let her know we need her mother's permission to tell her anything.

## 2017-10-19 NOTE — Telephone Encounter (Signed)
Copied from [place] [id number]. Topic: Quick Communication - See Telephone Encounter >> Oct 19, 2017  8:03 AM Gardiner Ramus wrote: CRM for notification. See Telephone encounter for: 10/19/17.pt daughter Financial risk analyst) called and stated that she would like a call from dr burns because she would like better clarification on labs. Pt daughter stated that the person who gave lab results could not answer all question. PT daughter would also like to know if labs were sent [facility]. Please advise Cb# [phone] pt daughter stated that it was urgent she get into kidney appointment.

## 2017-10-19 NOTE — Telephone Encounter (Signed)
Spoke with pts daughter to advise that labs have been sent to [facility]. Note added to appt in Nov to get updated DPR for our office.

## 2017-10-20 DIAGNOSIS — E1122 Type 2 diabetes mellitus with diabetic chronic kidney disease: Secondary | ICD-10-CM | POA: Diagnosis not present

## 2017-10-20 DIAGNOSIS — D649 Anemia, unspecified: Secondary | ICD-10-CM | POA: Diagnosis not present

## 2017-10-20 DIAGNOSIS — M10071 Idiopathic gout, right ankle and foot: Secondary | ICD-10-CM | POA: Diagnosis not present

## 2017-10-20 DIAGNOSIS — E1142 Type 2 diabetes mellitus with diabetic polyneuropathy: Secondary | ICD-10-CM | POA: Diagnosis not present

## 2017-10-20 DIAGNOSIS — N183 Chronic kidney disease, stage 3 (moderate): Secondary | ICD-10-CM | POA: Diagnosis not present

## 2017-10-20 DIAGNOSIS — H409 Unspecified glaucoma: Secondary | ICD-10-CM | POA: Diagnosis not present

## 2017-10-20 DIAGNOSIS — R55 Syncope and collapse: Secondary | ICD-10-CM | POA: Diagnosis not present

## 2017-10-20 DIAGNOSIS — I129 Hypertensive chronic kidney disease with stage 1 through stage 4 chronic kidney disease, or unspecified chronic kidney disease: Secondary | ICD-10-CM | POA: Diagnosis not present

## 2017-10-24 DIAGNOSIS — H409 Unspecified glaucoma: Secondary | ICD-10-CM | POA: Diagnosis not present

## 2017-10-24 DIAGNOSIS — N183 Chronic kidney disease, stage 3 (moderate): Secondary | ICD-10-CM | POA: Diagnosis not present

## 2017-10-24 DIAGNOSIS — E1142 Type 2 diabetes mellitus with diabetic polyneuropathy: Secondary | ICD-10-CM | POA: Diagnosis not present

## 2017-10-24 DIAGNOSIS — E1122 Type 2 diabetes mellitus with diabetic chronic kidney disease: Secondary | ICD-10-CM | POA: Diagnosis not present

## 2017-10-24 DIAGNOSIS — M10071 Idiopathic gout, right ankle and foot: Secondary | ICD-10-CM | POA: Diagnosis not present

## 2017-10-24 DIAGNOSIS — D649 Anemia, unspecified: Secondary | ICD-10-CM | POA: Diagnosis not present

## 2017-10-24 DIAGNOSIS — I129 Hypertensive chronic kidney disease with stage 1 through stage 4 chronic kidney disease, or unspecified chronic kidney disease: Secondary | ICD-10-CM | POA: Diagnosis not present

## 2017-10-24 DIAGNOSIS — R55 Syncope and collapse: Secondary | ICD-10-CM | POA: Diagnosis not present

## 2017-10-25 DIAGNOSIS — M10071 Idiopathic gout, right ankle and foot: Secondary | ICD-10-CM | POA: Diagnosis not present

## 2017-10-25 DIAGNOSIS — H409 Unspecified glaucoma: Secondary | ICD-10-CM | POA: Diagnosis not present

## 2017-10-25 DIAGNOSIS — R55 Syncope and collapse: Secondary | ICD-10-CM | POA: Diagnosis not present

## 2017-10-25 DIAGNOSIS — I129 Hypertensive chronic kidney disease with stage 1 through stage 4 chronic kidney disease, or unspecified chronic kidney disease: Secondary | ICD-10-CM | POA: Diagnosis not present

## 2017-10-25 DIAGNOSIS — N183 Chronic kidney disease, stage 3 (moderate): Secondary | ICD-10-CM | POA: Diagnosis not present

## 2017-10-25 DIAGNOSIS — E1122 Type 2 diabetes mellitus with diabetic chronic kidney disease: Secondary | ICD-10-CM | POA: Diagnosis not present

## 2017-10-25 DIAGNOSIS — E1142 Type 2 diabetes mellitus with diabetic polyneuropathy: Secondary | ICD-10-CM | POA: Diagnosis not present

## 2017-10-25 DIAGNOSIS — D649 Anemia, unspecified: Secondary | ICD-10-CM | POA: Diagnosis not present

## 2017-10-27 DIAGNOSIS — H409 Unspecified glaucoma: Secondary | ICD-10-CM | POA: Diagnosis not present

## 2017-10-27 DIAGNOSIS — M10071 Idiopathic gout, right ankle and foot: Secondary | ICD-10-CM | POA: Diagnosis not present

## 2017-10-27 DIAGNOSIS — E1122 Type 2 diabetes mellitus with diabetic chronic kidney disease: Secondary | ICD-10-CM | POA: Diagnosis not present

## 2017-10-27 DIAGNOSIS — D649 Anemia, unspecified: Secondary | ICD-10-CM | POA: Diagnosis not present

## 2017-10-27 DIAGNOSIS — E1142 Type 2 diabetes mellitus with diabetic polyneuropathy: Secondary | ICD-10-CM | POA: Diagnosis not present

## 2017-10-27 DIAGNOSIS — N183 Chronic kidney disease, stage 3 (moderate): Secondary | ICD-10-CM | POA: Diagnosis not present

## 2017-10-27 DIAGNOSIS — R55 Syncope and collapse: Secondary | ICD-10-CM | POA: Diagnosis not present

## 2017-10-27 DIAGNOSIS — I129 Hypertensive chronic kidney disease with stage 1 through stage 4 chronic kidney disease, or unspecified chronic kidney disease: Secondary | ICD-10-CM | POA: Diagnosis not present

## 2017-10-28 DIAGNOSIS — D631 Anemia in chronic kidney disease: Secondary | ICD-10-CM | POA: Diagnosis not present

## 2017-10-28 DIAGNOSIS — N184 Chronic kidney disease, stage 4 (severe): Secondary | ICD-10-CM | POA: Diagnosis not present

## 2017-10-28 DIAGNOSIS — I129 Hypertensive chronic kidney disease with stage 1 through stage 4 chronic kidney disease, or unspecified chronic kidney disease: Secondary | ICD-10-CM | POA: Diagnosis not present

## 2017-10-28 DIAGNOSIS — N2581 Secondary hyperparathyroidism of renal origin: Secondary | ICD-10-CM | POA: Diagnosis not present

## 2017-10-31 ENCOUNTER — Telehealth: Payer: Self-pay | Admitting: Internal Medicine

## 2017-10-31 DIAGNOSIS — E1122 Type 2 diabetes mellitus with diabetic chronic kidney disease: Secondary | ICD-10-CM | POA: Diagnosis not present

## 2017-10-31 DIAGNOSIS — D649 Anemia, unspecified: Secondary | ICD-10-CM | POA: Diagnosis not present

## 2017-10-31 DIAGNOSIS — R55 Syncope and collapse: Secondary | ICD-10-CM | POA: Diagnosis not present

## 2017-10-31 DIAGNOSIS — E1142 Type 2 diabetes mellitus with diabetic polyneuropathy: Secondary | ICD-10-CM | POA: Diagnosis not present

## 2017-10-31 DIAGNOSIS — M10071 Idiopathic gout, right ankle and foot: Secondary | ICD-10-CM | POA: Diagnosis not present

## 2017-10-31 DIAGNOSIS — H409 Unspecified glaucoma: Secondary | ICD-10-CM | POA: Diagnosis not present

## 2017-10-31 DIAGNOSIS — N183 Chronic kidney disease, stage 3 (moderate): Secondary | ICD-10-CM | POA: Diagnosis not present

## 2017-10-31 DIAGNOSIS — I129 Hypertensive chronic kidney disease with stage 1 through stage 4 chronic kidney disease, or unspecified chronic kidney disease: Secondary | ICD-10-CM | POA: Diagnosis not present

## 2017-10-31 NOTE — Telephone Encounter (Signed)
Without knowing what the cause of the pain is I can not prescribe something.  She would need to be seen to help determine the cause.

## 2017-10-31 NOTE — Telephone Encounter (Signed)
Copied from [place] [id number]. Topic: Quick Communication - See Telephone Encounter >> Oct 31, 2017  1:53 PM Vernona Rieger wrote: CRM for notification. See Telephone encounter for: 10/31/17.  Patient's daughter, [name] called and said she is having pains on her left side. She said she saw the kidney doctor on Friday & she failed to mention that to the doctor. She told her daughter after the fact. She said that she is wondering if Dr Eilleen Kempf would prescribe something for the pain or what she could take OTC? Call back to [place].  [facility] [phone] - [place] [place] [phone]

## 2017-10-31 NOTE — Telephone Encounter (Signed)
I did advise daughter she may need an OV for this issue. Daughter states that she had issues getting her a ride here due to work. Please advise.

## 2017-11-01 DIAGNOSIS — M10071 Idiopathic gout, right ankle and foot: Secondary | ICD-10-CM | POA: Diagnosis not present

## 2017-11-01 DIAGNOSIS — N183 Chronic kidney disease, stage 3 (moderate): Secondary | ICD-10-CM | POA: Diagnosis not present

## 2017-11-01 DIAGNOSIS — H409 Unspecified glaucoma: Secondary | ICD-10-CM | POA: Diagnosis not present

## 2017-11-01 DIAGNOSIS — I129 Hypertensive chronic kidney disease with stage 1 through stage 4 chronic kidney disease, or unspecified chronic kidney disease: Secondary | ICD-10-CM | POA: Diagnosis not present

## 2017-11-01 DIAGNOSIS — E1122 Type 2 diabetes mellitus with diabetic chronic kidney disease: Secondary | ICD-10-CM | POA: Diagnosis not present

## 2017-11-01 DIAGNOSIS — E1142 Type 2 diabetes mellitus with diabetic polyneuropathy: Secondary | ICD-10-CM | POA: Diagnosis not present

## 2017-11-01 DIAGNOSIS — R55 Syncope and collapse: Secondary | ICD-10-CM | POA: Diagnosis not present

## 2017-11-01 DIAGNOSIS — D649 Anemia, unspecified: Secondary | ICD-10-CM | POA: Diagnosis not present

## 2017-11-01 NOTE — Telephone Encounter (Signed)
Pts daughter is aware. Appointment set up for tomorrow.

## 2017-11-01 NOTE — Progress Notes (Signed)
Subjective:    Patient ID: Erin Liu, female    DOB: [birth year], 77 y.o.   MRN: [id number]  HPI The patient is here for an acute visit.   Left lower back -   Has always had lower back pain.  2 weeks ago she started having different lower back pain.  The pain is focused in her left lower back.  It does not radiate.  She denies any injuries or new activities that may have caused it.  She does get cramping in her bilateral legs, but this is not new.  She denies any new numbness or tingling.  She only has the pain when she moves around.  She denies any lower back pain with sitting or laying.  She thinks the pain has gotten worse since it started.  She has taken some Tylenol, but is not sure if it is helped or not.  She has not tried any other medications.  She is never seen anyone for back pain in the past and has never had imaging.  Medications and allergies reviewed with patient and updated if appropriate.  Patient Active Problem List   Diagnosis Date Noted  . Bilateral leg edema 10/13/2017  . Tremor of both hands 10/10/2017  . Urinary incontinence 10/10/2017  . Syncope 10/02/2017  . 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 B12 deficiency 04/11/2017  . Left flank pain 08/30/2015  . Cough 08/26/2015  . Lumbago 02/25/2015  . Essential tremor   . Glaucoma 01/08/2010  . Chronic kidney disease (CKD), stage IV (severe) ([place]) 12/10/2008  . DM (diabetes mellitus), secondary, uncontrolled, with renal complications ([place]) 62/13/0865  . Diabetic neuropathy ([place]) 02/22/2006  . Dyslipidemia 02/22/2006  . OBESITY 02/22/2006  . 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 CARPAL TUNNEL SYNDROME, BILATERAL 02/22/2006  . Essential hypertension 02/22/2006  . CONSTIPATION 02/22/2006    Current Outpatient Medications on File Prior to Visit  Medication Sig Dispense Refill  . acetaminophen (TYLENOL) 325 MG tablet Take 2 tablets (650 mg total) by mouth every 6 (six) hours as needed for mild  pain.    . Alcohol Swabs (B-D SINGLE USE SWABS REGULAR) PADS Use as directed. Dx E11.9 200 each 2  . allopurinol (ZYLOPRIM) 100 MG tablet Take 2 tablets (200 mg total) by mouth daily. 60 tablet 6  . amLODipine (NORVASC) 10 MG tablet TAKE 1 TABLET EVERY DAY 90 tablet 2  . atenolol (TENORMIN) 50 MG tablet TAKE 1 TABLET BY MOUTH ONCE DAILY 30 tablet 5  . atorvastatin (LIPITOR) 40 MG tablet TAKE 1 TABLET EVERY DAY 90 tablet 2  . Blood Glucose Calibration (TRUE METRIX LEVEL 1) Low SOLN Use as directed Dx. E11.9 3 each 0  . blood glucose meter kit and supplies KIT Dispense based on patient and insurance preference. Use up to four times daily as directed. (FOR ICD-9 250.00, 250.01). 1 each 0  . Blood Glucose Monitoring Suppl (TRUE METRIX AIR GLUCOSE METER) w/Device KIT 1 Device by Does not apply route daily. Use to check blood sugars twice a day. Dx E11.9 1 kit 0  . cyanocobalamin 2000 MCG tablet Take 1 tablet (2,000 mcg total) by mouth daily. 90 tablet 3  . furosemide (LASIX) 20 MG tablet Take 1 tablet (20 mg total) by mouth daily. 30 tablet 3  . glucose blood (TRUE METRIX BLOOD GLUCOSE TEST) test strip 1 each by Other route 2 (two) times daily. Use to check blood sugars twice a day. Dx E11.9 200  each 2  . HYDROcodone-acetaminophen (NORCO/VICODIN) 5-325 MG tablet Take 1 tablet by mouth every 4 (four) hours as needed for severe pain. 15 tablet 0  . TRUEPLUS LANCETS 33G MISC Use to help check blood sugars twice a day. Dx E11.9 200 each 2   No current facility-administered medications on file prior to visit.     Past Medical History:  Diagnosis Date  . ANEMIA, NORMOCYTIC   . BREAST CYST, HX OF   . CARPAL TUNNEL SYNDROME, BILATERAL   . DIABETES MELLITUS, TYPE II   . Glaucoma   . HYPERLIPIDEMIA   . HYPERTENSION   . OBESITY   . PERIPHERAL NEUROPATHY   . RENAL FAILURE, CHRONIC     Past Surgical History:  Procedure Laterality Date  . CATARACT EXTRACTION BILATERAL W/ ANTERIOR VITRECTOMY  05/2010    . COLONOSCOPY     [facility]/7-8 years    Social History   Socioeconomic History  . Marital status: Widowed    Spouse name: Not on file  . Number of children: Not on file  . Years of education: Not on file  . Highest education level: Not on file  Occupational History  . Not on file  Social Needs  . Financial resource strain: Not on file  . Food insecurity:    Worry: Not on file    Inability: Not on file  . Transportation needs:    Medical: Not on file    Non-medical: Not on file  Tobacco Use  . Smoking status: Never Smoker  . Smokeless tobacco: Never Used  Substance and Sexual Activity  . Alcohol use: No  . Drug use: No  . Sexual activity: Not on file  Lifestyle  . Physical activity:    Days per week: Not on file    Minutes per session: Not on file  . Stress: Not on file  Relationships  . Social connections:    Talks on phone: Not on file    Gets together: Not on file    Attends religious service: Not on file    Active member of club or organization: Not on file    Attends meetings of clubs or organizations: Not on file    Relationship status: Not on file  Other Topics Concern  . 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 Arthritis Son   . Alcohol abuse Son   . Diabetes Son   . Hypertension Son   . Colon cancer Son 19  . Heart attack Mother        Died age 42s  . Heart attack Father        No details 76s  . Heart disease Sister        Slight heart attack, died age 34    Review of Systems  Constitutional: Negative for chills and fever.  Gastrointestinal: Negative for abdominal pain, constipation and diarrhea.  Genitourinary: Negative for dysuria and hematuria.  Musculoskeletal: Positive for back pain (Without radiation, left lower back only).       Objective:   Vitals:   11/02/17 0940  BP: 134/62  Pulse: 70  Resp: 14  Temp: 97.6 F (36.4 C)   SpO2: 97%   BP Readings from Last 3 Encounters:  11/02/17 134/62  10/12/17 135/83  10/10/17 (!) 148/64   Wt Readings from Last 3 Encounters:  10/12/17 206 lb (93.4 kg)  10/10/17 206  lb (93.4 kg)  10/04/17 209 lb 7 oz (95 kg)   Body mass index is 33.25 kg/m.   Physical Exam  Constitutional: She appears well-developed and well-nourished. No distress.  HENT:  Head: Normocephalic and atraumatic.  Musculoskeletal: She exhibits no tenderness (Lumbar spine lower left lower back without tenderness with palpation, no pain with moving around in the wheelchair.  No pain at rest).  Neurological: No sensory deficit.  Skin: Skin is warm and dry. No rash noted. She is not diaphoretic.           Assessment & Plan:    See Problem List for Assessment and Plan of chronic medical problems.

## 2017-11-02 ENCOUNTER — Ambulatory Visit (INDEPENDENT_AMBULATORY_CARE_PROVIDER_SITE_OTHER): Payer: Medicare HMO | Admitting: Internal Medicine

## 2017-11-02 ENCOUNTER — Ambulatory Visit (INDEPENDENT_AMBULATORY_CARE_PROVIDER_SITE_OTHER)
Admission: RE | Admit: 2017-11-02 | Discharge: 2017-11-02 | Disposition: A | Discharge status: Home / Self Care | Payer: Medicare HMO | Source: Ambulatory Visit | Attending: Internal Medicine | Admitting: Internal Medicine

## 2017-11-02 ENCOUNTER — Encounter: Payer: Self-pay | Admitting: Internal Medicine

## 2017-11-02 VITALS — BP 134/62 | HR 70 | Temp 97.6°F | Resp 14 | Ht 66.0 in

## 2017-11-02 DIAGNOSIS — M545 Low back pain, unspecified: Secondary | ICD-10-CM

## 2017-11-02 NOTE — Assessment & Plan Note (Signed)
Left lower back pain started approximately 2 weeks ago-she states it has gotten worse No radiation No pain with sitting or laying-she only has pain with moving around Possible strain or arthritis X-ray today since she has not had imaging in the past Twice daily-advised not to take more than 3000 mg daily Ice, heat Topical arthritis medications Will refer to orthopedics for further evaluation and treatment

## 2017-11-02 NOTE — Patient Instructions (Addendum)
Start taking tylenol 2 tabs two times a day ( do not take more than 3000 mg at day)  Have an xray of your lower back today.  We will call you with the results.   Try icing the back or applying heat.  Try topical arthritis medications.     A referral was ordered for orthopedics.  They will call you to set up an appointment.

## 2017-11-03 ENCOUNTER — Telehealth: Payer: Self-pay | Admitting: Internal Medicine

## 2017-11-03 DIAGNOSIS — H409 Unspecified glaucoma: Secondary | ICD-10-CM | POA: Diagnosis not present

## 2017-11-03 DIAGNOSIS — E1122 Type 2 diabetes mellitus with diabetic chronic kidney disease: Secondary | ICD-10-CM | POA: Diagnosis not present

## 2017-11-03 DIAGNOSIS — E1142 Type 2 diabetes mellitus with diabetic polyneuropathy: Secondary | ICD-10-CM | POA: Diagnosis not present

## 2017-11-03 DIAGNOSIS — R55 Syncope and collapse: Secondary | ICD-10-CM | POA: Diagnosis not present

## 2017-11-03 DIAGNOSIS — M10071 Idiopathic gout, right ankle and foot: Secondary | ICD-10-CM | POA: Diagnosis not present

## 2017-11-03 DIAGNOSIS — N183 Chronic kidney disease, stage 3 (moderate): Secondary | ICD-10-CM | POA: Diagnosis not present

## 2017-11-03 DIAGNOSIS — D649 Anemia, unspecified: Secondary | ICD-10-CM | POA: Diagnosis not present

## 2017-11-03 DIAGNOSIS — I129 Hypertensive chronic kidney disease with stage 1 through stage 4 chronic kidney disease, or unspecified chronic kidney disease: Secondary | ICD-10-CM | POA: Diagnosis not present

## 2017-11-03 NOTE — Telephone Encounter (Signed)
Copied from [place] [id number]. Topic: Quick Communication - See Telephone Encounter >> Nov 03, 2017  4:37 PM Rutherford Nail, NT wrote: CRM for notification. See Telephone encounter for: 11/03/17. Amber, a physical therapist with [facility] calling to get verbal orders for the following: Continuation of physical therapy 2x a week for 2 weeks 1x a week for 2 weeks CB#: [phone]

## 2017-11-04 NOTE — Telephone Encounter (Signed)
Called and gave verbal orders for PT per provider.

## 2017-11-09 DIAGNOSIS — R55 Syncope and collapse: Secondary | ICD-10-CM | POA: Diagnosis not present

## 2017-11-09 DIAGNOSIS — N183 Chronic kidney disease, stage 3 (moderate): Secondary | ICD-10-CM | POA: Diagnosis not present

## 2017-11-09 DIAGNOSIS — M10071 Idiopathic gout, right ankle and foot: Secondary | ICD-10-CM | POA: Diagnosis not present

## 2017-11-09 DIAGNOSIS — D649 Anemia, unspecified: Secondary | ICD-10-CM | POA: Diagnosis not present

## 2017-11-09 DIAGNOSIS — E1142 Type 2 diabetes mellitus with diabetic polyneuropathy: Secondary | ICD-10-CM | POA: Diagnosis not present

## 2017-11-09 DIAGNOSIS — E1122 Type 2 diabetes mellitus with diabetic chronic kidney disease: Secondary | ICD-10-CM | POA: Diagnosis not present

## 2017-11-09 DIAGNOSIS — I129 Hypertensive chronic kidney disease with stage 1 through stage 4 chronic kidney disease, or unspecified chronic kidney disease: Secondary | ICD-10-CM | POA: Diagnosis not present

## 2017-11-09 DIAGNOSIS — H409 Unspecified glaucoma: Secondary | ICD-10-CM | POA: Diagnosis not present

## 2017-11-11 DIAGNOSIS — I129 Hypertensive chronic kidney disease with stage 1 through stage 4 chronic kidney disease, or unspecified chronic kidney disease: Secondary | ICD-10-CM | POA: Diagnosis not present

## 2017-11-11 DIAGNOSIS — D649 Anemia, unspecified: Secondary | ICD-10-CM | POA: Diagnosis not present

## 2017-11-11 DIAGNOSIS — E1122 Type 2 diabetes mellitus with diabetic chronic kidney disease: Secondary | ICD-10-CM | POA: Diagnosis not present

## 2017-11-11 DIAGNOSIS — E1142 Type 2 diabetes mellitus with diabetic polyneuropathy: Secondary | ICD-10-CM | POA: Diagnosis not present

## 2017-11-11 DIAGNOSIS — N183 Chronic kidney disease, stage 3 (moderate): Secondary | ICD-10-CM | POA: Diagnosis not present

## 2017-11-11 DIAGNOSIS — H409 Unspecified glaucoma: Secondary | ICD-10-CM | POA: Diagnosis not present

## 2017-11-11 DIAGNOSIS — M10071 Idiopathic gout, right ankle and foot: Secondary | ICD-10-CM | POA: Diagnosis not present

## 2017-11-11 DIAGNOSIS — R55 Syncope and collapse: Secondary | ICD-10-CM | POA: Diagnosis not present

## 2017-11-14 DIAGNOSIS — D649 Anemia, unspecified: Secondary | ICD-10-CM | POA: Diagnosis not present

## 2017-11-14 DIAGNOSIS — N183 Chronic kidney disease, stage 3 (moderate): Secondary | ICD-10-CM | POA: Diagnosis not present

## 2017-11-14 DIAGNOSIS — H409 Unspecified glaucoma: Secondary | ICD-10-CM | POA: Diagnosis not present

## 2017-11-14 DIAGNOSIS — R55 Syncope and collapse: Secondary | ICD-10-CM | POA: Diagnosis not present

## 2017-11-14 DIAGNOSIS — E1142 Type 2 diabetes mellitus with diabetic polyneuropathy: Secondary | ICD-10-CM | POA: Diagnosis not present

## 2017-11-14 DIAGNOSIS — E1122 Type 2 diabetes mellitus with diabetic chronic kidney disease: Secondary | ICD-10-CM | POA: Diagnosis not present

## 2017-11-14 DIAGNOSIS — I129 Hypertensive chronic kidney disease with stage 1 through stage 4 chronic kidney disease, or unspecified chronic kidney disease: Secondary | ICD-10-CM | POA: Diagnosis not present

## 2017-11-14 DIAGNOSIS — M10071 Idiopathic gout, right ankle and foot: Secondary | ICD-10-CM | POA: Diagnosis not present

## 2017-11-16 DIAGNOSIS — E1142 Type 2 diabetes mellitus with diabetic polyneuropathy: Secondary | ICD-10-CM | POA: Diagnosis not present

## 2017-11-16 DIAGNOSIS — D649 Anemia, unspecified: Secondary | ICD-10-CM | POA: Diagnosis not present

## 2017-11-16 DIAGNOSIS — N183 Chronic kidney disease, stage 3 (moderate): Secondary | ICD-10-CM | POA: Diagnosis not present

## 2017-11-16 DIAGNOSIS — M10071 Idiopathic gout, right ankle and foot: Secondary | ICD-10-CM | POA: Diagnosis not present

## 2017-11-16 DIAGNOSIS — I129 Hypertensive chronic kidney disease with stage 1 through stage 4 chronic kidney disease, or unspecified chronic kidney disease: Secondary | ICD-10-CM | POA: Diagnosis not present

## 2017-11-16 DIAGNOSIS — R55 Syncope and collapse: Secondary | ICD-10-CM | POA: Diagnosis not present

## 2017-11-16 DIAGNOSIS — E1122 Type 2 diabetes mellitus with diabetic chronic kidney disease: Secondary | ICD-10-CM | POA: Diagnosis not present

## 2017-11-16 DIAGNOSIS — H409 Unspecified glaucoma: Secondary | ICD-10-CM | POA: Diagnosis not present

## 2017-11-21 ENCOUNTER — Ambulatory Visit (INDEPENDENT_AMBULATORY_CARE_PROVIDER_SITE_OTHER): Payer: Medicare HMO | Admitting: Family Medicine

## 2017-11-21 ENCOUNTER — Encounter (INDEPENDENT_AMBULATORY_CARE_PROVIDER_SITE_OTHER): Payer: Self-pay | Admitting: Family Medicine

## 2017-11-21 DIAGNOSIS — G8929 Other chronic pain: Secondary | ICD-10-CM | POA: Diagnosis not present

## 2017-11-21 DIAGNOSIS — M545 Low back pain: Secondary | ICD-10-CM | POA: Diagnosis not present

## 2017-11-21 MED ORDER — TIZANIDINE HCL 2 MG PO TABS: 1.0000 mg | ORAL_TABLET | Freq: Three times a day (TID) | ORAL | 1 refills | Status: DC | PRN

## 2017-11-21 NOTE — Progress Notes (Signed)
Office Visit Note   Patient: Erin Liu           Date of Birth: 1940-12-29           MRN: [id number] Visit Date: 11/21/2017 Requested by: Binnie Rail, MD [place], [place] [address] PCP: Binnie Rail, MD  Subjective: Chief Complaint  Patient presents with  . Lower Back - Pain    Pain across lower back, but mainly on left side x months.  No known injury.  Denies radiculopathy.    HPI: She is a 77 year old with left-sided low back/posterior hip pain, seen at the request of Dr. Quay Burow.  Long-standing problems with her back, but in the past 2 or 3 weeks she has had a change in symptoms.  Now it seems to be left-sided, making it very difficult to walk.  She denies any radicular pain, no numbness or weakness, no new bowel or bladder dysfunction.  She has chronic troubles with urinary incontinence.  She has tried Tylenol for her pain with minimal relief.  She does get relief with sitting and lying down.  Denies any rash on her skin, denies any fever or chills.  She had x-rays done August 28 which were unrevealing, I reviewed those on computer as well.              ROS: She has diabetes and chronic kidney disease as well as hypertension.  Objective: Vital Signs: There were no vitals taken for this visit.  Physical Exam:  Back: No visible rash.  No bony tenderness over the spinous processes.  She does have some tenderness in the left-sided lumbar paraspinous muscles near the L5-S1 level.  No pain in the sciatic notch, straight leg raise negative.  Lower extremity strength and reflexes are normal.  Imaging: No new x-rays today.  Assessment & Plan: 1.  Left-sided low back/posterior hip pain, possibly muscular strain. -She already has home health, so we will ask him to have physical therapy do some treatments.  Trial of very low-dose tizanidine as needed. -If symptoms do not improve over the next couple weeks, then possibly MRI scan followed by epidural steroid injection if  indicated.   Follow-Up Instructions: No follow-ups on file.       Procedures: None today.   PMFS History: Patient Active Problem List   Diagnosis Date Noted  . Bilateral leg edema 10/13/2017  . Tremor of both hands 10/10/2017  . Urinary incontinence 10/10/2017  . Syncope 10/02/2017  . 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 B12 deficiency 04/11/2017  . Left flank pain 08/30/2015  . Cough 08/26/2015  . Lumbago 02/25/2015  . Essential tremor   . Glaucoma 01/08/2010  . Chronic kidney disease (CKD), stage IV (severe) ([place]) 12/10/2008  . DM (diabetes mellitus), secondary, uncontrolled, with renal complications ([place]) 46/65/9935  . Diabetic neuropathy ([place]) 02/22/2006  . Dyslipidemia 02/22/2006  . OBESITY 02/22/2006  . 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 CARPAL TUNNEL SYNDROME, BILATERAL 02/22/2006  . Essential hypertension 02/22/2006  . CONSTIPATION 02/22/2006   Past Medical History:  Diagnosis Date  . ANEMIA, NORMOCYTIC   . BREAST CYST, HX OF   . CARPAL TUNNEL SYNDROME, BILATERAL   . DIABETES MELLITUS, TYPE II   . Glaucoma   . HYPERLIPIDEMIA   . HYPERTENSION   . OBESITY   . PERIPHERAL NEUROPATHY   . RENAL FAILURE, CHRONIC     Family History  Problem Relation Age of Onset  . Arthritis  Son   . Alcohol abuse Son   . Diabetes Son   . Hypertension Son   . Colon cancer Son 63  . Heart attack Mother        Died age 58s  . Heart attack Father        No details 51s  . Heart disease Sister        Slight heart attack, died age 64    Past Surgical History:  Procedure Laterality Date  . CATARACT EXTRACTION BILATERAL W/ ANTERIOR VITRECTOMY  05/2010  . COLONOSCOPY     [facility]/7-8 years   Social History   Occupational History  . Not on file  Tobacco Use  . Smoking status: Never Smoker  . Smokeless tobacco: Never Used  Substance and Sexual Activity  . Alcohol use: No  . Drug use: No  . Sexual activity: Not on file

## 2017-11-22 DIAGNOSIS — I129 Hypertensive chronic kidney disease with stage 1 through stage 4 chronic kidney disease, or unspecified chronic kidney disease: Secondary | ICD-10-CM | POA: Diagnosis not present

## 2017-11-22 DIAGNOSIS — N183 Chronic kidney disease, stage 3 (moderate): Secondary | ICD-10-CM | POA: Diagnosis not present

## 2017-11-22 DIAGNOSIS — E1142 Type 2 diabetes mellitus with diabetic polyneuropathy: Secondary | ICD-10-CM | POA: Diagnosis not present

## 2017-11-22 DIAGNOSIS — M10071 Idiopathic gout, right ankle and foot: Secondary | ICD-10-CM | POA: Diagnosis not present

## 2017-11-22 DIAGNOSIS — E1122 Type 2 diabetes mellitus with diabetic chronic kidney disease: Secondary | ICD-10-CM | POA: Diagnosis not present

## 2017-11-22 DIAGNOSIS — D649 Anemia, unspecified: Secondary | ICD-10-CM | POA: Diagnosis not present

## 2017-11-22 DIAGNOSIS — R55 Syncope and collapse: Secondary | ICD-10-CM | POA: Diagnosis not present

## 2017-11-22 DIAGNOSIS — H409 Unspecified glaucoma: Secondary | ICD-10-CM | POA: Diagnosis not present

## 2017-12-02 DIAGNOSIS — E1122 Type 2 diabetes mellitus with diabetic chronic kidney disease: Secondary | ICD-10-CM | POA: Diagnosis not present

## 2017-12-02 DIAGNOSIS — H409 Unspecified glaucoma: Secondary | ICD-10-CM | POA: Diagnosis not present

## 2017-12-02 DIAGNOSIS — R55 Syncope and collapse: Secondary | ICD-10-CM | POA: Diagnosis not present

## 2017-12-02 DIAGNOSIS — D649 Anemia, unspecified: Secondary | ICD-10-CM | POA: Diagnosis not present

## 2017-12-02 DIAGNOSIS — M10071 Idiopathic gout, right ankle and foot: Secondary | ICD-10-CM | POA: Diagnosis not present

## 2017-12-02 DIAGNOSIS — I129 Hypertensive chronic kidney disease with stage 1 through stage 4 chronic kidney disease, or unspecified chronic kidney disease: Secondary | ICD-10-CM | POA: Diagnosis not present

## 2017-12-02 DIAGNOSIS — N183 Chronic kidney disease, stage 3 (moderate): Secondary | ICD-10-CM | POA: Diagnosis not present

## 2017-12-02 DIAGNOSIS — E1142 Type 2 diabetes mellitus with diabetic polyneuropathy: Secondary | ICD-10-CM | POA: Diagnosis not present

## 2017-12-09 DIAGNOSIS — E113593 Type 2 diabetes mellitus with proliferative diabetic retinopathy without macular edema, bilateral: Secondary | ICD-10-CM | POA: Diagnosis not present

## 2017-12-09 DIAGNOSIS — Z961 Presence of intraocular lens: Secondary | ICD-10-CM | POA: Diagnosis not present

## 2017-12-09 DIAGNOSIS — H401132 Primary open-angle glaucoma, bilateral, moderate stage: Secondary | ICD-10-CM | POA: Diagnosis not present

## 2017-12-12 LAB — HM DIABETES EYE EXAM

## 2017-12-14 ENCOUNTER — Other Ambulatory Visit: Payer: Self-pay

## 2017-12-14 ENCOUNTER — Emergency Department (HOSPITAL_COMMUNITY)
Admission: EM | Admit: 2017-12-14 | Discharge: 2017-12-14 | Disposition: A | Discharge status: Home / Self Care | Payer: Medicare HMO | Attending: Emergency Medicine | Admitting: Emergency Medicine

## 2017-12-14 ENCOUNTER — Encounter (HOSPITAL_COMMUNITY): Payer: Self-pay | Admitting: *Deleted

## 2017-12-14 DIAGNOSIS — I959 Hypotension, unspecified: Secondary | ICD-10-CM | POA: Diagnosis not present

## 2017-12-14 DIAGNOSIS — I951 Orthostatic hypotension: Secondary | ICD-10-CM

## 2017-12-14 DIAGNOSIS — I129 Hypertensive chronic kidney disease with stage 1 through stage 4 chronic kidney disease, or unspecified chronic kidney disease: Secondary | ICD-10-CM | POA: Diagnosis not present

## 2017-12-14 DIAGNOSIS — R55 Syncope and collapse: Secondary | ICD-10-CM | POA: Insufficient documentation

## 2017-12-14 DIAGNOSIS — E119 Type 2 diabetes mellitus without complications: Secondary | ICD-10-CM | POA: Insufficient documentation

## 2017-12-14 DIAGNOSIS — Z79899 Other long term (current) drug therapy: Secondary | ICD-10-CM | POA: Diagnosis not present

## 2017-12-14 DIAGNOSIS — N184 Chronic kidney disease, stage 4 (severe): Secondary | ICD-10-CM | POA: Diagnosis not present

## 2017-12-14 DIAGNOSIS — R531 Weakness: Secondary | ICD-10-CM | POA: Diagnosis not present

## 2017-12-14 LAB — URINALYSIS, ROUTINE W REFLEX MICROSCOPIC
Bilirubin Urine: NEGATIVE
Glucose, UA: NEGATIVE mg/dL
Hgb urine dipstick: NEGATIVE
KETONES UR: NEGATIVE mg/dL
NITRITE: NEGATIVE
PROTEIN: NEGATIVE mg/dL
Specific Gravity, Urine: 1.01 (ref 1.005–1.030)
pH: 5.5 (ref 5.0–8.0)

## 2017-12-14 LAB — URINALYSIS, MICROSCOPIC (REFLEX)

## 2017-12-14 LAB — BASIC METABOLIC PANEL
Anion gap: 13 (ref 5–15)
BUN: 83 mg/dL — AB (ref 8–23)
CALCIUM: 10.3 mg/dL (ref 8.9–10.3)
CO2: 22 mmol/L (ref 22–32)
CREATININE: 2.9 mg/dL — AB (ref 0.44–1.00)
Chloride: 102 mmol/L (ref 98–111)
GFR calc Af Amer: 17 mL/min — ABNORMAL LOW (ref >60)
GFR calc non Af Amer: 15 mL/min — ABNORMAL LOW (ref >60)
Glucose, Bld: 127 mg/dL — ABNORMAL HIGH (ref 70–99)
Potassium: 4.6 mmol/L (ref 3.5–5.1)
SODIUM: 137 mmol/L (ref 135–145)

## 2017-12-14 LAB — CBC
HCT: 31.3 % — ABNORMAL LOW (ref 36.0–46.0)
Hemoglobin: 9.6 g/dL — ABNORMAL LOW (ref 12.0–15.0)
MCH: 28.2 pg (ref 26.0–34.0)
MCHC: 30.7 g/dL (ref 30.0–36.0)
MCV: 91.8 fL (ref 80.0–100.0)
NRBC: 0 % (ref 0.0–0.2)
PLATELETS: 161 10*3/uL (ref 150–400)
RBC: 3.41 MIL/uL — ABNORMAL LOW (ref 3.87–5.11)
RDW: 17.5 % — AB (ref 11.5–15.5)
WBC: 5.2 10*3/uL (ref 4.0–10.5)

## 2017-12-14 LAB — CBG MONITORING, ED: Glucose-Capillary: 130 mg/dL — ABNORMAL HIGH (ref 70–99)

## 2017-12-14 NOTE — ED Provider Notes (Signed)
Patient with syncopal event today.  Assumed care from Dr. Venora Maples at 4 PM.  Patient with overall unremarkable work-up.  Obtaining a urinalysis to rule out infection.  Patient has had some frequency but denies any dysuria.  Patient had in and out performed to obtain urinalysis but it was lost in the lab.  Patient does not want to stay for another and and out cath.  She does not have any infectious symptoms.  No fever.  No white count. No dysuria. She would prefer to follow-up with her primary care doctor and was discharged from ED in good condition.   Lennice Sites, DO 12/14/17 2144

## 2017-12-14 NOTE — ED Provider Notes (Signed)
[facility] EMERGENCY DEPARTMENT Provider Note   CSN: [id number] Arrival date & time: 12/14/17  1407     History   Chief Complaint Chief Complaint  Patient presents with  . Near Syncope    HPI Erin Liu is a 78 y.o. female with DM, gout, HTN, anemia, h/o vasovagal syncope who presents for evaluation of syncopal episode. She reports being in her usual state of health until this morning. She was sitting on a stool in the kitchen cooking rice when her vision suddenly went dark. He son was nearby and caught her before she hit the floor. Per family, the patient had shaking of extremities but denied tongue biting or urinary/bowel incontinence and patient quickly returned to normal level of consciousness without prolonged state of confusion. She denies any prodromal symptoms such as chest pain, palpitations, flushing, nausea/vomiting, severe pain, dizziness or lightheadedness. She reports normal po intake.   Per chart review, she had a similar episode of syncope with shaking of extremities 3 months ago and the cause was determined to be a vasovagal response to severe pain in her foot from an acute gout flare. Her gout as since resolved and she is not currently experiencing any severe pain.   HPI  Past Medical History:  Diagnosis Date  . ANEMIA, NORMOCYTIC   . BREAST CYST, HX OF   . CARPAL TUNNEL SYNDROME, BILATERAL   . DIABETES MELLITUS, TYPE II   . Glaucoma   . HYPERLIPIDEMIA   . HYPERTENSION   . OBESITY   . PERIPHERAL NEUROPATHY   . RENAL FAILURE, CHRONIC     Patient Active Problem List   Diagnosis Date Noted  . Bilateral leg edema 10/13/2017  . Tremor of both hands 10/10/2017  . Urinary incontinence 10/10/2017  . Syncope 10/02/2017  . 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 B12 deficiency 04/11/2017  . Left flank pain 08/30/2015  . Cough 08/26/2015  . Lumbago 02/25/2015  . Essential tremor   . Glaucoma 01/08/2010  . Chronic  kidney disease (CKD), stage IV (severe) ([place]) 12/10/2008  . DM (diabetes mellitus), secondary, uncontrolled, with renal complications ([place]) 70/48/8891  . Diabetic neuropathy ([place]) 02/22/2006  . Dyslipidemia 02/22/2006  . OBESITY 02/22/2006  . 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 CARPAL TUNNEL SYNDROME, BILATERAL 02/22/2006  . Essential hypertension 02/22/2006  . CONSTIPATION 02/22/2006    Past Surgical History:  Procedure Laterality Date  . CATARACT EXTRACTION BILATERAL W/ ANTERIOR VITRECTOMY  05/2010  . COLONOSCOPY     [facility]/7-8 years     OB History   None      Home Medications    Prior to Admission medications   Medication Sig Start Date End Date Taking? Authorizing Provider  acetaminophen (TYLENOL) 325 MG tablet Take 2 tablets (650 mg total) by mouth every 6 (six) hours as needed for mild pain. 10/04/17   Geradine Girt, DO  Alcohol Swabs (B-D SINGLE USE SWABS REGULAR) PADS Use as directed. Dx E11.9 04/08/16   Binnie Rail, MD  allopurinol (ZYLOPRIM) 100 MG tablet Take 2 tablets (200 mg total) by mouth daily. 07/12/17   Binnie Rail, MD  amLODipine (NORVASC) 10 MG tablet TAKE 1 TABLET EVERY DAY 07/04/17   Binnie Rail, MD  atenolol (TENORMIN) 50 MG tablet TAKE 1 TABLET BY MOUTH ONCE DAILY 09/12/17   Binnie Rail, MD  atorvastatin (LIPITOR) 40 MG tablet TAKE 1 TABLET EVERY DAY 07/04/17   Binnie Rail, MD  Blood  Glucose Calibration (TRUE METRIX LEVEL 1) Low SOLN Use as directed Dx. E11.9 04/08/16   Binnie Rail, MD  blood glucose meter kit and supplies KIT Dispense based on patient and insurance preference. Use up to four times daily as directed. (FOR ICD-9 250.00, 250.01). 08/29/15   Binnie Rail, MD  Blood Glucose Monitoring Suppl (TRUE METRIX AIR GLUCOSE METER) w/Device KIT 1 Device by Does not apply route daily. Use to check blood sugars twice a day. Dx E11.9 04/08/16   Binnie Rail, MD  cyanocobalamin 2000 MCG tablet Take 1 tablet (2,000 mcg total) by mouth daily. 04/11/17    Binnie Rail, MD  furosemide (LASIX) 20 MG tablet Take 1 tablet (20 mg total) by mouth daily. 10/13/17   Binnie Rail, MD  glucose blood (TRUE METRIX BLOOD GLUCOSE TEST) test strip 1 each by Other route 2 (two) times daily. Use to check blood sugars twice a day. Dx E11.9 04/08/16   Binnie Rail, MD  tiZANidine (ZANAFLEX) 2 MG tablet Take 0.5 tablets (1 mg total) by mouth every 8 (eight) hours as needed for muscle spasms. 11/21/17   Hilts, Legrand Como, MD  TRUEPLUS LANCETS 33G MISC Use to help check blood sugars twice a day. Dx E11.9 04/08/16   Binnie Rail, MD    Family History Family History  Problem Relation Age of Onset  . Arthritis Son   . Alcohol abuse Son   . Diabetes Son   . Hypertension Son   . Colon cancer Son 84  . Heart attack Mother        Died age 30s  . Heart attack Father        No details 65s  . Heart disease Sister        Slight heart attack, died age 80    Social History Social History   Tobacco Use  . Smoking status: Never Smoker  . Smokeless tobacco: Never Used  Substance Use Topics  . Alcohol use: No  . Drug use: No     Allergies   Lisinopril   Review of Systems Review of Systems See HPI  Physical Exam Updated Vital Signs BP (!) 141/58 (BP Location: Right Arm)   Pulse 70   Temp (!) 97.5 F (36.4 C) (Oral)   Resp 16   Ht '5\' 9"'  (1.753 m)   Wt 96.6 kg   SpO2 100%   BMI 31.45 kg/m   Physical Exam Vitals:   12/14/17 1430 12/14/17 1431  BP: (!) 141/58   Pulse: 70   Resp: 16   Temp: (!) 97.5 F (36.4 C)   TempSrc: Oral   SpO2: 100%   Weight:  96.6 kg  Height:  '5\' 9"'  (1.753 m)   General: Vital signs reviewed.  Patient is well-developed and well-nourished, in no acute distress and cooperative with exam.  Head: Normocephalic and atraumatic. Eyes: EOMI, conjunctivae normal, no scleral icterus.  Neck: Supple, trachea midline, normal ROM, no JVD Cardiovascular: RRR, S1 normal, S2 normal, no murmurs, gallops, or rubs. Pulmonary/Chest:  Clear to auscultation bilaterally, no wheezes, rales, or rhonchi. Abdominal: Soft, non-tender, non-distended, BS +, no masses, organomegaly, or guarding present.  Musculoskeletal: No joint deformities, erythema, or stiffness, ROM full and nontender. Extremities: No lower extremity edema bilaterally,  pulses symmetric and intact bilaterally. Neurological: A&O x3, Strength is normal and symmetric bilaterally, cranial nerve II-XII are grossly intact, no focal motor deficit, sensory intact to light touch bilaterally.  Skin: Warm, dry and intact. No  rashes or erythema. Psychiatric: Normal mood and affect. speech and behavior is normal. Cognition and memory are normal.    ED Treatments / Results  Labs (all labs ordered are listed, but only abnormal results are displayed) Labs Reviewed  CBC - Abnormal; Notable for the following components:      Result Value   RBC 3.41 (*)    Hemoglobin 9.6 (*)    HCT 31.3 (*)    RDW 17.5 (*)    All other components within normal limits  CBG MONITORING, ED - Abnormal; Notable for the following components:   Glucose-Capillary 130 (*)    All other components within normal limits  URINALYSIS, ROUTINE W REFLEX MICROSCOPIC  BASIC METABOLIC PANEL    EKG None  Radiology No results found.  Procedures Procedures (including critical care time)  Medications Ordered in ED Medications - No data to display   Initial Impression / Assessment and Plan / ED Course  I have reviewed the triage vital signs and the nursing notes.  Pertinent labs & imaging results that were available during my care of the patient were reviewed by me and considered in my medical decision making (see chart for details).     77yo syncopal episode. Hemodynamically stable. Most likely orthostatic syncope given positive orthostatic vital signs.  Doubt seizures due to lack of tongue biting, urinary/bowel incontinence, and lack of post-ictal phase. EKG with old LBBB, no new ischemic changes  or abnormal rhythm. No chest pain, palpitations, diaphoresis. Doubt cardiogenic syncope. Vital signs stable, bilateral upper extremity pulses strong and symmetric, doubt aortic dissection. No chest pain, tachycardia, or hypoxia to suggest PE. Hemoglobin stable at 9.6. No hypoglycemia.   Urinalysis and BMP pending   Final Clinical Impressions(s) / ED Diagnoses   Final diagnoses:  Orthostatic syncope    ED Discharge Orders    None       Isabelle Course, MD 12/14/17 Lavelle, Kevin, MD 12/14/17 [phone]

## 2017-12-14 NOTE — ED Triage Notes (Signed)
States she was sitting at her sink and she felt like she was "going out" , denies loc denies pain . States she ate breakfast , states she normally walks with a walker at home

## 2017-12-14 NOTE — ED Notes (Signed)
Patient informed of need for second urine specimen. Given water and will try in 20 minutes, patient states "I don't want to be cathed again".

## 2017-12-14 NOTE — ED Notes (Signed)
Patient verbalizes understanding of medications and discharge instructions. No further questions at this time. VSS and patient ambulatory at discharge.   

## 2017-12-18 NOTE — Progress Notes (Signed)
Subjective:    Patient ID: Erin Liu, female    DOB: 1940/10/24, 77 y.o.   MRN: [id number]  HPI The patient is here for follow up from the ED.  ED on 12/14/17 for a syncopal episode.  She had gotten up at her home health aide noticed that she did not look good and was acting funny and dropped the bowl she was hanging onto.  She placed her on stool.  She was sitting on a stool in the kitchen and that is when she collapsed.  Her son was there and caught her.  Per her family she had shaking of her extremities, but no tongue biting or urinary/bowel incontinence.  She was not confused after the event.  She denies any prodromal symptoms such as chest pain, palpitations, flushing, N/V, pain, dizziness/lightheadedness.  She had been eating/drinking normally.  She had a similar episode three months ago and she was diagnosed at that time with a vasovagal episode.  That episode was in response to severe foot pain from gout.    In the ED her vitals were stable, but she had positive orthostatic vitals signs.   Her exam was unremarkable.  EKG showed no changes.  Blood work stable.  She was diagnosed with an orthostatic syncope.    Right foot pain: it started hurting the other day.  It hurts on top of the foot.  This is not similar to her usual gout pain.  She does not recall any injuries to the foot.  She wondered if she can still take Tylenol.  Medications and allergies reviewed with patient and updated if appropriate.  Patient Active Problem List   Diagnosis Date Noted  . Bilateral leg edema 10/13/2017  . Tremor of both hands 10/10/2017  . Urinary incontinence 10/10/2017  . Vasovagal syncope 10/02/2017  . 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 B12 deficiency 04/11/2017  . Left flank pain 08/30/2015  . Cough 08/26/2015  . Lumbago 02/25/2015  . Essential tremor   . Glaucoma 01/08/2010  . Chronic kidney disease (CKD), stage IV (severe) ([place]) 12/10/2008  . DM (diabetes  mellitus), secondary, uncontrolled, with renal complications ([place]) 98/92/1194  . Diabetic neuropathy ([place]) 02/22/2006  . Dyslipidemia 02/22/2006  . OBESITY 02/22/2006  . 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 CARPAL TUNNEL SYNDROME, BILATERAL 02/22/2006  . Essential hypertension 02/22/2006  . CONSTIPATION 02/22/2006    Current Outpatient Medications on File Prior to Visit  Medication Sig Dispense Refill  . acetaminophen (TYLENOL) 325 MG tablet Take 2 tablets (650 mg total) by mouth every 6 (six) hours as needed for mild pain.    . Alcohol Swabs (B-D SINGLE USE SWABS REGULAR) PADS Use as directed. Dx E11.9 200 each 2  . allopurinol (ZYLOPRIM) 100 MG tablet Take 2 tablets (200 mg total) by mouth daily. 60 tablet 6  . atenolol (TENORMIN) 50 MG tablet TAKE 1 TABLET BY MOUTH ONCE DAILY (Patient taking differently: Take 50 mg by mouth daily. ) 30 tablet 5  . atorvastatin (LIPITOR) 40 MG tablet TAKE 1 TABLET EVERY DAY (Patient taking differently: Take 40 mg by mouth daily at 6 PM. ) 90 tablet 2  . Blood Glucose Calibration (TRUE METRIX LEVEL 1) Low SOLN Use as directed Dx. E11.9 3 each 0  . blood glucose meter kit and supplies KIT Dispense based on patient and insurance preference. Use up to four times daily as directed. (FOR ICD-9 250.00, 250.01). 1 each 0  . Blood Glucose Monitoring  Suppl (TRUE METRIX AIR GLUCOSE METER) w/Device KIT 1 Device by Does not apply route daily. Use to check blood sugars twice a day. Dx E11.9 1 kit 0  . cyanocobalamin 2000 MCG tablet Take 1 tablet (2,000 mcg total) by mouth daily. 90 tablet 3  . furosemide (LASIX) 20 MG tablet Take 1 tablet (20 mg total) by mouth daily. 30 tablet 3  . glucose blood (TRUE METRIX BLOOD GLUCOSE TEST) test strip 1 each by Other route 2 (two) times daily. Use to check blood sugars twice a day. Dx E11.9 200 each 2  . latanoprost (XALATAN) 0.005 % ophthalmic solution Place 1 drop into both eyes at bedtime.  3  . tiZANidine (ZANAFLEX) 2 MG tablet Take  0.5 tablets (1 mg total) by mouth every 8 (eight) hours as needed for muscle spasms. 30 tablet 1  . TRUEPLUS LANCETS 33G MISC Use to help check blood sugars twice a day. Dx E11.9 200 each 2   No current facility-administered medications on file prior to visit.     Past Medical History:  Diagnosis Date  . ANEMIA, NORMOCYTIC   . BREAST CYST, HX OF   . CARPAL TUNNEL SYNDROME, BILATERAL   . DIABETES MELLITUS, TYPE II   . Glaucoma   . HYPERLIPIDEMIA   . HYPERTENSION   . OBESITY   . PERIPHERAL NEUROPATHY   . RENAL FAILURE, CHRONIC     Past Surgical History:  Procedure Laterality Date  . CATARACT EXTRACTION BILATERAL W/ ANTERIOR VITRECTOMY  05/2010  . COLONOSCOPY     [facility]/7-8 years    Social History   Socioeconomic History  . Marital status: Widowed    Spouse name: Not on file  . Number of children: Not on file  . Years of education: Not on file  . Highest education level: Not on file  Occupational History  . Not on file  Social Needs  . Financial resource strain: Not on file  . Food insecurity:    Worry: Not on file    Inability: Not on file  . Transportation needs:    Medical: Not on file    Non-medical: Not on file  Tobacco Use  . Smoking status: Never Smoker  . Smokeless tobacco: Never Used  Substance and Sexual Activity  . Alcohol use: No  . Drug use: No  . Sexual activity: Not on file  Lifestyle  . Physical activity:    Days per week: Not on file    Minutes per session: Not on file  . Stress: Not on file  Relationships  . Social connections:    Talks on phone: Not on file    Gets together: Not on file    Attends religious service: Not on file    Active member of club or organization: Not on file    Attends meetings of clubs or organizations: Not on file    Relationship status: Not on file  Other Topics Concern  . Not on file  Social History Narrative   Widowed, lives with son. Retired Medical illustrator    Family History  Problem Relation Age of  Onset  . Arthritis Son   . Alcohol abuse Son   . Diabetes Son   . Hypertension Son   . Colon cancer Son 99  . Heart attack Mother        Died age 63s  . Heart attack Father        No details 52s  . Heart disease Sister  Slight heart attack, died age 77    Review of Systems  Constitutional: Negative for chills and fever.  Respiratory: Positive for cough (dry cough). Negative for shortness of breath and wheezing.   Cardiovascular: Positive for leg swelling (right ankle). Negative for chest pain and palpitations.  Gastrointestinal: Negative for abdominal pain and diarrhea.  Neurological: Negative for light-headedness and headaches.       Objective:   Vitals:   12/19/17 1000  BP: 134/60  Pulse: 71  Resp: 16  Temp: (!) 97.5 F (36.4 C)  SpO2: 98%   BP Readings from Last 3 Encounters:  12/19/17 134/60  12/14/17 (!) 143/63  11/02/17 134/62   Wt Readings from Last 3 Encounters:  12/19/17 191 lb 12.8 oz (87 kg)  12/14/17 213 lb (96.6 kg)  10/12/17 206 lb (93.4 kg)   Body mass index is 28.32 kg/m.   Physical Exam    Constitutional: Appears well-developed and well-nourished. No distress.  HENT:  Head: Normocephalic and atraumatic.  Neck: Neck supple. No tracheal deviation present. No thyromegaly present.  No cervical lymphadenopathy Cardiovascular: Normal rate, regular rhythm and normal heart sounds.   No murmur heard. No carotid bruit .  No edema Pulmonary/Chest: Effort normal and breath sounds normal. No respiratory distress. No has no wheezes. No rales.  Skin: Skin is warm and dry. Not diaphoretic.  Psychiatric: Normal mood and affect. Behavior is normal.    Lab Results  Component Value Date   WBC 5.2 12/14/2017   HGB 9.6 (L) 12/14/2017   HCT 31.3 (L) 12/14/2017   PLT 161 12/14/2017   GLUCOSE 127 (H) 12/14/2017   CHOL 206 (H) 04/11/2017   TRIG 91.0 04/11/2017   HDL 68.00 04/11/2017   LDLDIRECT 230.5 09/11/2010   LDLCALC 120 (H) 04/11/2017   ALT 12  10/17/2017   AST 20 10/17/2017   NA 137 12/14/2017   K 4.6 12/14/2017   CL 102 12/14/2017   CREATININE 2.90 (H) 12/14/2017   BUN 83 (H) 12/14/2017   CO2 22 12/14/2017   TSH 1.96 04/11/2017   HGBA1C 6.9 (H) 10/02/2017   MICROALBUR 5.3 (H) 02/25/2015      Assessment & Plan:    See Problem List for Assessment and Plan of chronic medical problems.

## 2017-12-18 NOTE — Patient Instructions (Addendum)
  Flu immunization administered today.    Medications reviewed and updated.  Changes include :   Amlodipine decreased to 10 mg to 5 mg.    Your prescription(s) have been submitted to your pharmacy. Please take as directed and contact our office if you believe you are having problem(s) with the medication(s).    Please followup in November as scheduled

## 2017-12-19 ENCOUNTER — Encounter: Payer: Self-pay | Admitting: Internal Medicine

## 2017-12-19 ENCOUNTER — Ambulatory Visit (INDEPENDENT_AMBULATORY_CARE_PROVIDER_SITE_OTHER): Payer: Medicare HMO | Admitting: Internal Medicine

## 2017-12-19 VITALS — BP 134/60 | HR 71 | Temp 97.5°F | Resp 16 | Ht 69.0 in | Wt 191.8 lb

## 2017-12-19 DIAGNOSIS — I10 Essential (primary) hypertension: Secondary | ICD-10-CM | POA: Diagnosis not present

## 2017-12-19 DIAGNOSIS — N184 Chronic kidney disease, stage 4 (severe): Secondary | ICD-10-CM | POA: Diagnosis not present

## 2017-12-19 DIAGNOSIS — Z23 Encounter for immunization: Secondary | ICD-10-CM | POA: Diagnosis not present

## 2017-12-19 DIAGNOSIS — R55 Syncope and collapse: Secondary | ICD-10-CM | POA: Diagnosis not present

## 2017-12-19 DIAGNOSIS — N2581 Secondary hyperparathyroidism of renal origin: Secondary | ICD-10-CM | POA: Diagnosis not present

## 2017-12-19 MED ORDER — AMLODIPINE BESYLATE 5 MG PO TABS: 5.0000 mg | ORAL_TABLET | Freq: Every day | ORAL | 3 refills | Status: DC

## 2017-12-19 NOTE — Assessment & Plan Note (Signed)
Blood pressure currently well controlled here She does not monitor it at home and I have encouraged her to get a blood pressure cuff so that she can monitor it regularly Since she has had 2 episodes of epic disorders orthostatic syncope we will decrease her blood pressure medication Monitor BP at home

## 2017-12-19 NOTE — Assessment & Plan Note (Signed)
2 episodes - first ? Related to pain - from gout; second episode related to vasovagal - transient hypotension Will dec amlodipine to 5 mg daily Continue atenolol 50 mg daily Monitor BP at home Continue good hydration Pump legs prior to standing

## 2017-12-26 DIAGNOSIS — I129 Hypertensive chronic kidney disease with stage 1 through stage 4 chronic kidney disease, or unspecified chronic kidney disease: Secondary | ICD-10-CM | POA: Diagnosis not present

## 2017-12-26 DIAGNOSIS — N184 Chronic kidney disease, stage 4 (severe): Secondary | ICD-10-CM | POA: Diagnosis not present

## 2017-12-26 DIAGNOSIS — D631 Anemia in chronic kidney disease: Secondary | ICD-10-CM | POA: Diagnosis not present

## 2017-12-26 DIAGNOSIS — N2581 Secondary hyperparathyroidism of renal origin: Secondary | ICD-10-CM | POA: Diagnosis not present

## 2017-12-30 ENCOUNTER — Other Ambulatory Visit: Payer: Self-pay | Admitting: *Deleted

## 2017-12-30 DIAGNOSIS — N184 Chronic kidney disease, stage 4 (severe): Secondary | ICD-10-CM

## 2018-01-02 ENCOUNTER — Other Ambulatory Visit: Payer: Self-pay | Admitting: Internal Medicine

## 2018-01-09 NOTE — Progress Notes (Signed)
Subjective:    Patient ID: Erin Liu, female    DOB: [birth year], 77 y.o.   MRN: [id number]  HPI The patient is here for follow up.  Back pain;  She has chronic back pain - it is worse she yesterday.  She has seen ortho and has not taken tizanidine.  She is taking Tylenol.  She was advised that the pain was secondary to arthritis.  Vasovagal syncope:  She was here one month ago.  First episode was related to severe pain, ? Second episode related to transient hypotension. I decreased her amlodipine to 5 mg daily.  She was advised to monitor her BP at home and pump her legs prior to standing up.  She denies any lightheadedness or dizziness and has not had any other fainting episodes.  Hypertension: She is taking her medication daily. She is compliant with a low sodium diet.  She denies chest pain, edema, shortness of breath and regular headaches. She is not exercising regularly.      Diabetes: She is controlling her sugars without diet. She is compliant with a diabetic diet. She is not exercising regularly. She monitors her sugars infrequently.Marland Kitchen   Hyperlipidemia: She is taking her medication daily. She is compliant with a low fat/cholesterol diet. She is not exercising regularly.    Gout:  She is taking the allopurinol daily.  She denies any gout symptoms since she was here last.  She ate oatmeal today - nothing for lunch.  She is very fatigued here at the office and keeps falling asleep.  Her granddaughter states she got up at 3:00 in the morning.  She is complaining of a headache.  Medications and allergies reviewed with patient and updated if appropriate.  Patient Active Problem List   Diagnosis Date Noted  . Bilateral leg edema 10/13/2017  . Tremor of both hands 10/10/2017  . Urinary incontinence 10/10/2017  . Vasovagal syncope 10/02/2017  . 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 B12 deficiency 04/11/2017  . Left flank pain 08/30/2015  . Cough 08/26/2015   . Lumbago 02/25/2015  . Essential tremor   . Glaucoma 01/08/2010  . Chronic kidney disease (CKD), stage IV (severe) ([place]) 12/10/2008  . DM (diabetes mellitus), secondary, uncontrolled, with renal complications ([place]) 32/20/2542  . Diabetic neuropathy ([place]) 02/22/2006  . Dyslipidemia 02/22/2006  . OBESITY 02/22/2006  . 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 CARPAL TUNNEL SYNDROME, BILATERAL 02/22/2006  . Essential hypertension 02/22/2006  . CONSTIPATION 02/22/2006    Current Outpatient Medications on File Prior to Visit  Medication Sig Dispense Refill  . acetaminophen (TYLENOL) 325 MG tablet Take 2 tablets (650 mg total) by mouth every 6 (six) hours as needed for mild pain.    . Alcohol Swabs (B-D SINGLE USE SWABS REGULAR) PADS Use as directed. Dx E11.9 200 each 2  . allopurinol (ZYLOPRIM) 100 MG tablet Take 2 tablets (200 mg total) by mouth daily. 60 tablet 6  . amLODipine (NORVASC) 5 MG tablet Take 1 tablet (5 mg total) by mouth daily. 90 tablet 3  . atenolol (TENORMIN) 50 MG tablet TAKE 1 TABLET BY MOUTH ONCE DAILY (Patient taking differently: Take 50 mg by mouth daily. ) 30 tablet 5  . atorvastatin (LIPITOR) 40 MG tablet TAKE 1 TABLET EVERY DAY (Patient taking differently: Take 40 mg by mouth daily at 6 PM. ) 90 tablet 2  . Blood Glucose Calibration (TRUE METRIX LEVEL 1) Low SOLN Use as directed Dx. E11.9 3 each  0  . cyanocobalamin 2000 MCG tablet Take 1 tablet (2,000 mcg total) by mouth daily. 90 tablet 3  . furosemide (LASIX) 20 MG tablet Take 1 tablet (20 mg total) by mouth daily. 30 tablet 3  . glucose blood (TRUE METRIX BLOOD GLUCOSE TEST) test strip 1 each by Other route 2 (two) times daily. Use to check blood sugars twice a day. Dx E11.9 200 each 2  . latanoprost (XALATAN) 0.005 % ophthalmic solution Place 1 drop into both eyes at bedtime.  3  . tiZANidine (ZANAFLEX) 2 MG tablet Take 0.5 tablets (1 mg total) by mouth every 8 (eight) hours as needed for muscle spasms. 30 tablet 1  .  TRUEPLUS LANCETS 33G MISC Use to help check blood sugars twice a day. Dx E11.9 200 each 2   No current facility-administered medications on file prior to visit.     Past Medical History:  Diagnosis Date  . ANEMIA, NORMOCYTIC   . BREAST CYST, HX OF   . CARPAL TUNNEL SYNDROME, BILATERAL   . DIABETES MELLITUS, TYPE II   . Glaucoma   . HYPERLIPIDEMIA   . HYPERTENSION   . OBESITY   . PERIPHERAL NEUROPATHY   . RENAL FAILURE, CHRONIC     Past Surgical History:  Procedure Laterality Date  . CATARACT EXTRACTION BILATERAL W/ ANTERIOR VITRECTOMY  05/2010  . COLONOSCOPY     [facility]/7-8 years    Social History   Socioeconomic History  . Marital status: Widowed    Spouse name: Not on file  . Number of children: Not on file  . Years of education: Not on file  . Highest education level: Not on file  Occupational History  . Not on file  Social Needs  . Financial resource strain: Not on file  . Food insecurity:    Worry: Not on file    Inability: Not on file  . Transportation needs:    Medical: Not on file    Non-medical: Not on file  Tobacco Use  . Smoking status: Never Smoker  . Smokeless tobacco: Never Used  Substance and Sexual Activity  . Alcohol use: No  . Drug use: No  . Sexual activity: Not on file  Lifestyle  . Physical activity:    Days per week: Not on file    Minutes per session: Not on file  . Stress: Not on file  Relationships  . Social connections:    Talks on phone: Not on file    Gets together: Not on file    Attends religious service: Not on file    Active member of club or organization: Not on file    Attends meetings of clubs or organizations: Not on file    Relationship status: Not on file  Other Topics Concern  . 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 Arthritis Son   . Alcohol abuse Son   . Diabetes Son   . Hypertension Son   . Colon cancer  Son 51  . Heart attack Mother        Died age 54s  . Heart attack Father        No details 17s  . Heart disease Sister        Slight heart attack, died age 31    Review of Systems  Constitutional: Positive for fatigue. Negative for appetite change, chills and fever.  Respiratory: Negative for cough, shortness of breath and wheezing.   Cardiovascular: Positive for palpitations. Negative for chest pain and leg swelling.  Gastrointestinal: Negative for nausea.  Genitourinary: Negative for frequency and hematuria.       No urine odor  Musculoskeletal: Positive for back pain.  Neurological: Positive for headaches. Negative for light-headedness.       Objective:   Vitals:   01/10/18 1513  BP: (!) 102/52  Pulse: 79  Resp: 18  Temp: 98.1 F (36.7 C)  SpO2: 97%   BP Readings from Last 3 Encounters:  01/10/18 (!) 102/52  12/19/17 134/60  12/14/17 (!) 143/63   Wt Readings from Last 3 Encounters:  01/10/18 184 lb (83.5 kg)  12/19/17 191 lb 12.8 oz (87 kg)  12/14/17 213 lb (96.6 kg)   Body mass index is 27.17 kg/m.   Physical Exam    Constitutional: Appears fatigued, falling asleep throughout visit. No distress.  HENT:  Head: Normocephalic and atraumatic.  Neck: Neck supple. No tracheal deviation present. No thyromegaly present.  No cervical lymphadenopathy Cardiovascular: Normal rate, regular rhythm and normal heart sounds.   No murmur heard..  No edema Pulmonary/Chest: Effort normal and breath sounds normal. No respiratory distress. No has no wheezes. No rales. Abdomen: Soft, nontender Skin: Skin is warm and dry. Not diaphoretic.        Assessment & Plan:    See Problem List for Assessment and Plan of chronic medical problems.

## 2018-01-10 ENCOUNTER — Emergency Department (HOSPITAL_COMMUNITY)
Admission: EM | Admit: 2018-01-10 | Discharge: 2018-01-11 | Disposition: A | Discharge status: Home / Self Care | Payer: Medicare HMO | Attending: Emergency Medicine | Admitting: Emergency Medicine

## 2018-01-10 ENCOUNTER — Other Ambulatory Visit (INDEPENDENT_AMBULATORY_CARE_PROVIDER_SITE_OTHER): Payer: Medicare HMO

## 2018-01-10 ENCOUNTER — Other Ambulatory Visit: Payer: Self-pay

## 2018-01-10 ENCOUNTER — Ambulatory Visit (INDEPENDENT_AMBULATORY_CARE_PROVIDER_SITE_OTHER): Payer: Medicare HMO | Admitting: Internal Medicine

## 2018-01-10 ENCOUNTER — Encounter (HOSPITAL_COMMUNITY): Payer: Self-pay | Admitting: Emergency Medicine

## 2018-01-10 ENCOUNTER — Telehealth: Payer: Self-pay | Admitting: Internal Medicine

## 2018-01-10 ENCOUNTER — Encounter: Payer: Self-pay | Admitting: Internal Medicine

## 2018-01-10 ENCOUNTER — Ambulatory Visit: Payer: Medicare HMO | Admitting: Internal Medicine

## 2018-01-10 VITALS — BP 102/52 | HR 79 | Temp 98.1°F | Resp 18 | Ht 69.0 in | Wt 184.0 lb

## 2018-01-10 DIAGNOSIS — Z79899 Other long term (current) drug therapy: Secondary | ICD-10-CM | POA: Insufficient documentation

## 2018-01-10 DIAGNOSIS — E785 Hyperlipidemia, unspecified: Secondary | ICD-10-CM

## 2018-01-10 DIAGNOSIS — IMO0002 Reserved for concepts with insufficient information to code with codable children: Secondary | ICD-10-CM

## 2018-01-10 DIAGNOSIS — R5383 Other fatigue: Secondary | ICD-10-CM | POA: Diagnosis not present

## 2018-01-10 DIAGNOSIS — I447 Left bundle-branch block, unspecified: Secondary | ICD-10-CM | POA: Diagnosis not present

## 2018-01-10 DIAGNOSIS — I10 Essential (primary) hypertension: Secondary | ICD-10-CM

## 2018-01-10 DIAGNOSIS — E876 Hypokalemia: Secondary | ICD-10-CM | POA: Diagnosis not present

## 2018-01-10 DIAGNOSIS — R55 Syncope and collapse: Secondary | ICD-10-CM

## 2018-01-10 DIAGNOSIS — E0843 Diabetes mellitus due to underlying condition with diabetic autonomic (poly)neuropathy: Secondary | ICD-10-CM

## 2018-01-10 DIAGNOSIS — N184 Chronic kidney disease, stage 4 (severe): Secondary | ICD-10-CM | POA: Diagnosis not present

## 2018-01-10 DIAGNOSIS — M10371 Gout due to renal impairment, right ankle and foot: Secondary | ICD-10-CM

## 2018-01-10 DIAGNOSIS — E1365 Other specified diabetes mellitus with hyperglycemia: Secondary | ICD-10-CM

## 2018-01-10 DIAGNOSIS — E1329 Other specified diabetes mellitus with other diabetic kidney complication: Secondary | ICD-10-CM

## 2018-01-10 DIAGNOSIS — E1122 Type 2 diabetes mellitus with diabetic chronic kidney disease: Secondary | ICD-10-CM | POA: Diagnosis not present

## 2018-01-10 DIAGNOSIS — I129 Hypertensive chronic kidney disease with stage 1 through stage 4 chronic kidney disease, or unspecified chronic kidney disease: Secondary | ICD-10-CM | POA: Insufficient documentation

## 2018-01-10 LAB — LIPID PANEL
CHOLESTEROL: 135 mg/dL (ref 0–200)
HDL: 46.7 mg/dL (ref >39.00)
LDL Cholesterol: 71 mg/dL (ref 0–99)
NonHDL: 88.45
Total CHOL/HDL Ratio: 3
Triglycerides: 89 mg/dL (ref 0.0–149.0)
VLDL: 17.8 mg/dL (ref 0.0–40.0)

## 2018-01-10 LAB — COMPREHENSIVE METABOLIC PANEL
ALK PHOS: 56 U/L (ref 39–117)
ALT: 15 U/L (ref 0–35)
ALT: 18 U/L (ref 0–44)
ANION GAP: 12 (ref 5–15)
AST: 34 U/L (ref 0–37)
AST: 42 U/L — ABNORMAL HIGH (ref 15–41)
Albumin: 3.7 g/dL (ref 3.5–5.2)
Albumin: 3.4 g/dL — ABNORMAL LOW (ref 3.5–5.0)
Alkaline Phosphatase: 58 U/L (ref 38–126)
BILIRUBIN TOTAL: 0.9 mg/dL (ref 0.2–1.2)
BUN: 80 mg/dL — AB (ref 6–23)
BUN: 78 mg/dL — ABNORMAL HIGH (ref 8–23)
CO2: 27 mEq/L (ref 19–32)
CO2: 25 mmol/L (ref 22–32)
CREATININE: 3.47 mg/dL — AB (ref 0.40–1.20)
Calcium: 9.7 mg/dL (ref 8.4–10.5)
Calcium: 9.4 mg/dL (ref 8.9–10.3)
Chloride: 92 mEq/L — ABNORMAL LOW (ref 96–112)
Chloride: 95 mmol/L — ABNORMAL LOW (ref 98–111)
Creatinine, Ser: 3.81 mg/dL — ABNORMAL HIGH (ref 0.44–1.00)
GFR calc non Af Amer: 11 mL/min — ABNORMAL LOW (ref >60)
GFR, EST AFRICAN AMERICAN: 12 mL/min — AB (ref >60)
GFR: 16.42 mL/min — AB (ref >60.00)
Glucose, Bld: 155 mg/dL — ABNORMAL HIGH (ref 70–99)
Glucose, Bld: 172 mg/dL — ABNORMAL HIGH (ref 70–99)
POTASSIUM: 2.5 mmol/L — AB (ref 3.5–5.1)
Potassium: 2.7 mEq/L — CL (ref 3.5–5.1)
SODIUM: 132 mmol/L — AB (ref 135–145)
Sodium: 132 mEq/L — ABNORMAL LOW (ref 135–145)
TOTAL PROTEIN: 7.5 g/dL (ref 6.0–8.3)
TOTAL PROTEIN: 7 g/dL (ref 6.5–8.1)
Total Bilirubin: 1 mg/dL (ref 0.3–1.2)

## 2018-01-10 LAB — CBC WITH DIFFERENTIAL/PLATELET
BASOS ABS: 0 10*3/uL (ref 0.0–0.1)
Basophils Relative: 0.7 % (ref 0.0–3.0)
EOS ABS: 0.1 10*3/uL (ref 0.0–0.7)
Eosinophils Relative: 1.3 % (ref 0.0–5.0)
HCT: 27.3 % — ABNORMAL LOW (ref 36.0–46.0)
Hemoglobin: 9.1 g/dL — ABNORMAL LOW (ref 12.0–15.0)
LYMPHS ABS: 0.9 10*3/uL (ref 0.7–4.0)
Lymphocytes Relative: 13.8 % (ref 12.0–46.0)
MCHC: 33.2 g/dL (ref 30.0–36.0)
MCV: 90.6 fl (ref 78.0–100.0)
MONO ABS: 0.6 10*3/uL (ref 0.1–1.0)
MONOS PCT: 8.5 % (ref 3.0–12.0)
NEUTROS PCT: 75.7 % (ref 43.0–77.0)
Neutro Abs: 5.1 10*3/uL (ref 1.4–7.7)
Platelets: 170 10*3/uL (ref 150.0–400.0)
RBC: 3.01 Mil/uL — AB (ref 3.87–5.11)
RDW: 19.4 % — AB (ref 11.5–15.5)
WBC: 6.7 10*3/uL (ref 4.0–10.5)

## 2018-01-10 LAB — CBC
HEMATOCRIT: 28 % — AB (ref 36.0–46.0)
Hemoglobin: 8.9 g/dL — ABNORMAL LOW (ref 12.0–15.0)
MCH: 29.2 pg (ref 26.0–34.0)
MCHC: 31.8 g/dL (ref 30.0–36.0)
MCV: 91.8 fL (ref 80.0–100.0)
Platelets: 154 10*3/uL (ref 150–400)
RBC: 3.05 MIL/uL — ABNORMAL LOW (ref 3.87–5.11)
RDW: 17 % — AB (ref 11.5–15.5)
WBC: 6.8 10*3/uL (ref 4.0–10.5)
nRBC: 0.3 % — ABNORMAL HIGH (ref 0.0–0.2)

## 2018-01-10 LAB — URIC ACID: URIC ACID, SERUM: 3.3 mg/dL (ref 2.4–7.0)

## 2018-01-10 LAB — HEMOGLOBIN A1C: Hgb A1c MFr Bld: 6.5 % (ref 4.6–6.5)

## 2018-01-10 MED ORDER — POTASSIUM CHLORIDE CRYS ER 20 MEQ PO TBCR
40.0000 meq | EXTENDED_RELEASE_TABLET | Freq: Once | ORAL | Status: AC
  Administered 2018-01-11: 40 meq via ORAL
  Filled 2018-01-10: qty 2

## 2018-01-10 MED ORDER — POTASSIUM CHLORIDE 10 MEQ/100ML IV SOLN
10.0000 meq | Freq: Once | INTRAVENOUS | Status: AC
  Administered 2018-01-11: 10 meq via INTRAVENOUS
  Filled 2018-01-10: qty 100

## 2018-01-10 NOTE — Telephone Encounter (Signed)
Thank you :)

## 2018-01-10 NOTE — ED Triage Notes (Signed)
Pt presents with pain with ambulation x 3-4 weeks; walks with cane at home; states hx of gout; denies falls, denies weakness

## 2018-01-10 NOTE — Assessment & Plan Note (Signed)
Following with nephrology Will check CMP given current symptoms-concern for worsening kidney function

## 2018-01-10 NOTE — Telephone Encounter (Signed)
Saa,from [facility] calling to report critical potassium level of 2.7. Dr. Larose Kells, on call physician called and notified of critical value.

## 2018-01-10 NOTE — Telephone Encounter (Addendum)
Spoke w/ pt's granddaughter, ref labs:  K+ 2.7 in the context of CRI w/ worsening creatinine (2.9 increase to 3.47) and low dose lasix. States the pt is not feeling well, very tired, rec ER eval now    Offered to d/w Desmond Lope, my contact # was provided

## 2018-01-10 NOTE — Assessment & Plan Note (Signed)
She is acting somewhat lethargic-easily arousable but constantly falling asleep with some difficulty answering questions She did get up at 3:00 in the morning and that may be causing some of the symptoms-family does not seem overly concerned No obvious signs of infection, but that is the other concern Fingerstick here today over 200 Concern for worsening kidney function-possibly uremia CBC, CMP Advised not to take any tizanidine, which they states she is not taking-we will discontinue from last Depending on his results may need to follow-up with nephrology soon If confusion/fatigue/headaches persist may need to go to the emergency room

## 2018-01-10 NOTE — Assessment & Plan Note (Signed)
No gout symptoms since her last visit Continue allopurinol Check uric acid level

## 2018-01-10 NOTE — ED Notes (Signed)
Upon reviewing chart-- patient went to provider today and notified patient that K+ was low and kidney funct tests worsening

## 2018-01-10 NOTE — Patient Instructions (Addendum)
  Tests ordered today. Your results will be released to [facility] (or called to you) after review, usually within 72hours after test completion. If any changes need to be made, you will be notified at that same time.   Medications reviewed and updated.  Changes include :   Stop the Amlodipine.  Stop the tizanidine (muscle relaxer )     Please followup in 6 months, sooner if needed

## 2018-01-10 NOTE — ED Notes (Signed)
Secretary, Karena Addison advised that pt left, this nurse went outside and advised pt that she needs to stay d/t critical lab results.  Pt and daughter agreed to stay and be seen.

## 2018-01-10 NOTE — Assessment & Plan Note (Signed)
Check lipid panel, CMP Continue statin

## 2018-01-10 NOTE — Assessment & Plan Note (Signed)
Blood pressures on the low side today despite decreasing amlodipine 3 weeks ago Will discontinue completely Monitor blood pressure Continue atenolol 50 mg daily CMP

## 2018-01-10 NOTE — Telephone Encounter (Signed)
Copied from [facility] [id number]. Topic: Quick Communication - See Telephone Encounter >> Jan 10, 2018  4:41 PM Rutherford Nail, NT wrote: CRM for notification. See Telephone encounter for: 01/10/18. Patient's daughter, Desmond Lope, calling and states that the patient is needing a wheelchair. Would like the order placed to [facility] since they know her history per daughter, if that is possible. CB#: [phone]

## 2018-01-10 NOTE — Assessment & Plan Note (Signed)
Diet controlled Fingerstick here today just over 200 Monitor sugars at home intermittently Check A1c today

## 2018-01-10 NOTE — Assessment & Plan Note (Signed)
2 episodes in the past several months Amlodipine decreased to 5 mg 3 weeks ago and will discontinue today given hypotension Monitor blood pressure at home

## 2018-01-11 LAB — BASIC METABOLIC PANEL
ANION GAP: 7 (ref 5–15)
Anion gap: 7 (ref 5–15)
BUN: 68 mg/dL — AB (ref 8–23)
BUN: 79 mg/dL — AB (ref 8–23)
CALCIUM: 7.4 mg/dL — AB (ref 8.9–10.3)
CALCIUM: 9.1 mg/dL (ref 8.9–10.3)
CO2: 22 mmol/L (ref 22–32)
CO2: 26 mmol/L (ref 22–32)
CREATININE: 2.84 mg/dL — AB (ref 0.44–1.00)
CREATININE: 3.16 mg/dL — AB (ref 0.44–1.00)
Chloride: 107 mmol/L (ref 98–111)
Chloride: 101 mmol/L (ref 98–111)
GFR calc Af Amer: 17 mL/min — ABNORMAL LOW (ref >60)
GFR calc Af Amer: 15 mL/min — ABNORMAL LOW (ref >60)
GFR, EST NON AFRICAN AMERICAN: 15 mL/min — AB (ref >60)
GFR, EST NON AFRICAN AMERICAN: 13 mL/min — AB (ref >60)
GLUCOSE: 116 mg/dL — AB (ref 70–99)
Glucose, Bld: 106 mg/dL — ABNORMAL HIGH (ref 70–99)
POTASSIUM: 2.3 mmol/L — AB (ref 3.5–5.1)
Potassium: 2.9 mmol/L — ABNORMAL LOW (ref 3.5–5.1)
SODIUM: 136 mmol/L (ref 135–145)
Sodium: 134 mmol/L — ABNORMAL LOW (ref 135–145)

## 2018-01-11 LAB — MAGNESIUM: MAGNESIUM: 1.7 mg/dL (ref 1.7–2.4)

## 2018-01-11 MED ORDER — POTASSIUM CHLORIDE CRYS ER 20 MEQ PO TBCR: 40.0000 meq | EXTENDED_RELEASE_TABLET | Freq: Every day | ORAL | 0 refills | Status: DC

## 2018-01-11 MED ORDER — POTASSIUM CHLORIDE CRYS ER 20 MEQ PO TBCR
40.0000 meq | EXTENDED_RELEASE_TABLET | Freq: Once | ORAL | Status: AC
  Administered 2018-01-11: 40 meq via ORAL
  Filled 2018-01-11: qty 2

## 2018-01-11 MED ORDER — SODIUM CHLORIDE 0.9 % IV BOLUS
500.0000 mL | Freq: Once | INTRAVENOUS | Status: AC
  Administered 2018-01-11: 500 mL via INTRAVENOUS

## 2018-01-11 MED ORDER — MAGNESIUM OXIDE 400 (241.3 MG) MG PO TABS
400.0000 mg | ORAL_TABLET | Freq: Once | ORAL | Status: AC
  Administered 2018-01-11: 400 mg via ORAL
  Filled 2018-01-11: qty 1

## 2018-01-11 MED ORDER — POTASSIUM CHLORIDE 10 MEQ/100ML IV SOLN
10.0000 meq | Freq: Once | INTRAVENOUS | Status: AC
  Administered 2018-01-11: 10 meq via INTRAVENOUS
  Filled 2018-01-11: qty 100

## 2018-01-11 NOTE — ED Provider Notes (Signed)
[facility] EMERGENCY DEPARTMENT Provider Note   CSN: [id number] Arrival date & time: 01/10/18  1817     History   Chief Complaint Chief Complaint  Patient presents with  . Gout  . Pain    HPI [name] is a 77 y.o. female.  HPI  77 year old female past medical history significant for CKD, hypertension, presents to the emergency department today after being referred by her primary care doctor for hypokalemia and elevated creatinine.  Patient had a regular work-up today by her primary care doctor blood work at that time.  She was notified that her potassium was low and that her kidney function was elevated.  Patient was instructed by her primary care doctor to come to the ED.  Patient denies any current symptoms.  She states that she does have a history of hypokalemia and has been on oral potassium supplementation as prescribed by her nephrologist.  She does have baseline CKD.  Patient is on Lasix daily.  Patient denies any lightheadedness, dizziness, chest pain, shortness of breath, palpitations.  She denies any lower leg swelling or pain.  She takes her medications as prescribed.  Pt denies any fever, chill, ha, vision changes, lightheadedness, dizziness, congestion, neck pain, cp, sob, cough, abd pain, n/v/d, urinary symptoms, change in bowel habits, melena, hematochezia, lower extremity paresthesias.  Past Medical History:  Diagnosis Date  . ANEMIA, NORMOCYTIC   . BREAST CYST, HX OF   . CARPAL TUNNEL SYNDROME, BILATERAL   . DIABETES MELLITUS, TYPE II   . Glaucoma   . HYPERLIPIDEMIA   . HYPERTENSION   . OBESITY   . PERIPHERAL NEUROPATHY   . RENAL FAILURE, CHRONIC     Patient Active Problem List   Diagnosis Date Noted  . Lethargic 01/10/2018  . Bilateral leg edema 10/13/2017  . Tremor of both hands 10/10/2017  . Urinary incontinence 10/10/2017  . Vasovagal syncope 10/02/2017  . 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 B12  deficiency 04/11/2017  . Left flank pain 08/30/2015  . Cough 08/26/2015  . Lumbago 02/25/2015  . Essential tremor   . Glaucoma 01/08/2010  . Chronic kidney disease (CKD), stage IV (severe) ([place]) 12/10/2008  . DM (diabetes mellitus), secondary, uncontrolled, with renal complications ([place]) 62/69/4854  . Diabetic neuropathy ([place]) 02/22/2006  . Dyslipidemia 02/22/2006  . OBESITY 02/22/2006  . 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 CARPAL TUNNEL SYNDROME, BILATERAL 02/22/2006  . Essential hypertension 02/22/2006  . CONSTIPATION 02/22/2006    Past Surgical History:  Procedure Laterality Date  . CATARACT EXTRACTION BILATERAL W/ ANTERIOR VITRECTOMY  05/2010  . COLONOSCOPY     [facility]/7-8 years     OB History   None      Home Medications    Prior to Admission medications   Medication Sig Start Date End Date Taking? Authorizing Provider  acetaminophen (TYLENOL) 325 MG tablet Take 2 tablets (650 mg total) by mouth every 6 (six) hours as needed for mild pain. 10/04/17  Yes Geradine Girt, DO  allopurinol (ZYLOPRIM) 100 MG tablet Take 2 tablets (200 mg total) by mouth daily. 07/12/17  Yes Burns, Claudina Lick, MD  atenolol (TENORMIN) 50 MG tablet TAKE 1 TABLET BY MOUTH ONCE DAILY Patient taking differently: Take 50 mg by mouth daily.  09/12/17  Yes Burns, Claudina Lick, MD  atorvastatin (LIPITOR) 40 MG tablet TAKE 1 TABLET EVERY DAY Patient taking differently: Take 40 mg by mouth daily at 6 PM.  07/04/17  Yes Burns,  Claudina Lick, MD  cyanocobalamin 2000 MCG tablet Take 1 tablet (2,000 mcg total) by mouth daily. 04/11/17  Yes Burns, Claudina Lick, MD  furosemide (LASIX) 20 MG tablet Take 1 tablet (20 mg total) by mouth daily. 10/13/17  Yes Burns, Claudina Lick, MD  latanoprost (XALATAN) 0.005 % ophthalmic solution Place 1 drop into both eyes at bedtime. 12/09/17  Yes [provider]  Alcohol Swabs (B-D SINGLE USE SWABS REGULAR) PADS Use as directed. Dx E11.9 04/08/16   Binnie Rail, MD  Blood Glucose Calibration (TRUE METRIX  LEVEL 1) Low SOLN Use as directed Dx. E11.9 04/08/16   Burns, Claudina Lick, MD  glucose blood (TRUE METRIX BLOOD GLUCOSE TEST) test strip 1 each by Other route 2 (two) times daily. Use to check blood sugars twice a day. Dx E11.9 04/08/16   Binnie Rail, MD  TRUEPLUS LANCETS 33G MISC Use to help check blood sugars twice a day. Dx E11.9 04/08/16   Binnie Rail, MD    Family History Family History  Problem Relation Age of Onset  . Arthritis Son   . Alcohol abuse Son   . Diabetes Son   . Hypertension Son   . Colon cancer Son 45  . Heart attack Mother        Died age 2s  . Heart attack Father        No details 43s  . Heart disease Sister        Slight heart attack, died age 66    Social History Social History   Tobacco Use  . Smoking status: Never Smoker  . Smokeless tobacco: Never Used  Substance Use Topics  . Alcohol use: No  . Drug use: No     Allergies   Lisinopril   Review of Systems Review of Systems  All other systems reviewed and are negative.    Physical Exam Updated Vital Signs BP 107/70   Pulse 73   Temp 97.6 F (36.4 C) (Oral)   Resp (!) 24   SpO2 99%   Physical Exam  Constitutional: She appears well-developed and well-nourished. No distress.  HENT:  Head: Normocephalic and atraumatic.  Eyes: Right eye exhibits no discharge. Left eye exhibits no discharge. No scleral icterus.  Neck: Normal range of motion. Neck supple.  Cardiovascular: Normal rate, regular rhythm, normal heart sounds and intact distal pulses. Exam reveals no gallop and no friction rub.  No murmur heard. Pulmonary/Chest: Effort normal and breath sounds normal. No stridor. No respiratory distress. She has no wheezes. She has no rales. She exhibits no tenderness.  Abdominal: Soft. Bowel sounds are normal.  Musculoskeletal: Normal range of motion. She exhibits no edema or tenderness.  Neurological: She is alert.  Skin: Skin is warm and dry. Capillary refill takes less than 2 seconds. No  pallor.  Psychiatric: Her behavior is normal. Judgment and thought content normal.  Nursing note and vitals reviewed.    ED Treatments / Results  Labs (all labs ordered are listed, but only abnormal results are displayed) Labs Reviewed  CBC - Abnormal; Notable for the following components:      Result Value   RBC 3.05 (*)    Hemoglobin 8.9 (*)    HCT 28.0 (*)    RDW 17.0 (*)    nRBC 0.3 (*)    All other components within normal limits  COMPREHENSIVE METABOLIC PANEL - Abnormal; Notable for the following components:   Sodium 132 (*)    Potassium 2.5 (*)  Chloride 95 (*)    Glucose, Bld 172 (*)    BUN 78 (*)    Creatinine, Ser 3.81 (*)    Albumin 3.4 (*)    AST 42 (*)    GFR calc non Af Amer 11 (*)    GFR calc Af Amer 12 (*)    All other components within normal limits    EKG EKG Interpretation  Date/Time:  Tuesday January 10 2018 21:51:44 EST Ventricular Rate:  70 PR Interval:    QRS Duration: 181 QT Interval:  503 QTC Calculation: 543 R Axis:   -34 Text Interpretation:  Sinus rhythm Left bundle branch block Baseline wander in lead(s) V1 No significant change since last tracing Confirmed by Addison Lank [phone]) on 01/11/2018 12:15:47 AM   Radiology No results found.  Procedures Procedures (including critical care time)  Medications Ordered in ED Medications  potassium chloride 10 mEq in 100 mL IVPB (10 mEq Intravenous New Bag/Given 01/11/18 0106)  sodium chloride 0.9 % bolus 500 mL (500 mLs Intravenous New Bag/Given 01/11/18 0109)  potassium chloride SA (K-DUR,KLOR-CON) CR tablet 40 mEq (40 mEq Oral Given 01/11/18 0105)     Initial Impression / Assessment and Plan / ED Course  I have reviewed the triage vital signs and the nursing notes.  Pertinent labs & imaging results that were available during my care of the patient were reviewed by me and considered in my medical decision making (see chart for details).  Clinical Course as of Jan 12 115  Wed Jan 11, 2018  0015 nRBC(!): 0.3 [PC]    Clinical Course User Index [PC] Cardama, Grayce Sessions, MD    Patient presents to the emergency department today to be referred by her primary care doctor for hypokalemia and increased serum creatinine.  She has no symptoms at this time.  She does have baseline CKD.  Vital signs are reassuring.  Heart regular rate and rhythm.  Lungs clear to auscultation bilaterally.  Lab work shows a mild hypokalemia of 2.5.  Glucose elevated at 172.  Creatinine is 3.81 however baseline appears to be 2.9.  Patient is on Lasix which I do believe is contributing to her hypokalemia and mildly elevated creatinine.  Hemoglobin of 8.9 is at patient's baseline.  EKG shows a sinus rhythm with left bundle branch block and appears similar to prior tracing.  Patient to be given oral and IV potassium.  She also given fluids.  We will recheck metabolic panel for improvement in potassium and decrease in serum creatinine. Pt to be signed out to Dr. Leonette Monarch who will follow up on lab results and set dispo accordingly. He has seen pt and is agreeable with the above plan.   Final Clinical Impressions(s) / ED Diagnoses   Final diagnoses:  Hypokalemia    ED Discharge Orders    None       Aaron Edelman 01/11/18 0122    Fatima Blank, MD 01/11/18 [phone]

## 2018-01-11 NOTE — ED Provider Notes (Signed)
Blood pressure 139/64, pulse 74, temperature 97.6 F (36.4 C), temperature source Oral, resp. rate (!) 21, SpO2 95 %.  Assuming care from Dr. Leonette Monarch.  In short, Erin Liu is a 77 y.o. female with a chief complaint of Gout and Pain .  Refer to the original H&P for additional details.  The current plan of care is to f/u repeat BMP and discharge if potassium is up-trending.   8:07 AM The patient's repeat BMP has a potassium of 2.9 which is up from earlier values. Will discharge home with plan to continue potassium supplements and f/u with PCP on Friday for repeat labs.       Margette Fast, MD 01/11/18 [phone]

## 2018-01-11 NOTE — ED Notes (Signed)
Discharge instructions discussed with Pt. Pt verbalized understanding. Pt stable and leaving via WC.    

## 2018-01-11 NOTE — Discharge Instructions (Signed)
You were seen in the ED today with low potassium. You will need to call your PCP this morning and schedule an appointment for repeat potassium labs to be done on Friday. Return to the ED with any weakness, shortness or breath, chest pain, heart skipping beats, or other concerning symptoms.

## 2018-01-12 DIAGNOSIS — E876 Hypokalemia: Secondary | ICD-10-CM | POA: Insufficient documentation

## 2018-01-12 NOTE — Progress Notes (Signed)
Subjective:    Patient ID: Erin Liu, female    DOB: 1940-11-14, 77 y.o.   MRN: [id number]  HPI The patient is here for follow up from the ED.  She was here three days ago for follow up.  She was lethargic, complaining of a headache and ? Confused.  Her blood work came back with very low potassium and worsening of her kidney function.  She went to the ED for further evaluation and treatment.  When she went to the ED she had no concerning symptoms at that time. In the ED the EKG showed no acute changes. She received IVF, potassium and her kidney function and potassium improved. She was discharged home.   BMP Latest Ref Rng & Units 01/11/2018 01/11/2018 01/10/2018  Glucose 70 - 99 mg/dL 116(H) 106(H) 172(H)  BUN 8 - 23 mg/dL 79(H) 68(H) 78(H)  Creatinine 0.44 - 1.00 mg/dL 3.16(H) 2.84(H) 3.81(H)  Sodium 135 - 145 mmol/L 134(L) 136 132(L)  Potassium 3.5 - 5.1 mmol/L 2.9(L) 2.3(LL) 2.5(LL)  Chloride 98 - 111 mmol/L 101 107 95(L)  CO2 22 - 32 mmol/L 26 22 25   Calcium 8.9 - 10.3 mg/dL 9.1 7.4(L) 9.4    She has been taking the potassium supplementation prescribed by ED.  She is taking Lokelma prescribed by her nephrologist that lowers potassium. She is taking lasix daily.    She denies any symptoms.  She denies leg edema, SOB, lightheadedness and confusion.   Her BP is low here today.  She denies any symptoms.    Needs a wheelchair:    Patient suffers from diabetic neuropathy, poor balance, low stamia and physical deconditioning which impairs their ability to perform daily activities like toileting, feeding, dressing, grooming, bathing. In the home. A can or walker will not resolve issue with performing activities of daily living. A wheelchair will allow patient to safely perform dialy activities. Patient can safely propel the wheel chair in the home or has a caregiver who can provide assistance.    Medications and allergies reviewed with patient and updated if appropriate.  Patient  Active Problem List   Diagnosis Date Noted  . Hypokalemia 01/12/2018  . Lethargic 01/10/2018  . Bilateral leg edema 10/13/2017  . Tremor of both hands 10/10/2017  . Urinary incontinence 10/10/2017  . Vasovagal syncope 10/02/2017  . 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 B12 deficiency 04/11/2017  . Left flank pain 08/30/2015  . Cough 08/26/2015  . Lumbago 02/25/2015  . Essential tremor   . Glaucoma 01/08/2010  . Chronic kidney disease (CKD), stage IV (severe) ([place]) 12/10/2008  . DM (diabetes mellitus), secondary, uncontrolled, with renal complications ([place]) 33/82/5053  . Diabetic neuropathy ([place]) 02/22/2006  . Dyslipidemia 02/22/2006  . OBESITY 02/22/2006  . 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 CARPAL TUNNEL SYNDROME, BILATERAL 02/22/2006  . Essential hypertension 02/22/2006  . CONSTIPATION 02/22/2006    Current Outpatient Medications on File Prior to Visit  Medication Sig Dispense Refill  . acetaminophen (TYLENOL) 325 MG tablet Take 2 tablets (650 mg total) by mouth every 6 (six) hours as needed for mild pain.    . Alcohol Swabs (B-D SINGLE USE SWABS REGULAR) PADS Use as directed. Dx E11.9 200 each 2  . allopurinol (ZYLOPRIM) 100 MG tablet Take 2 tablets (200 mg total) by mouth daily. 60 tablet 6  . atenolol (TENORMIN) 50 MG tablet TAKE 1 TABLET BY MOUTH ONCE DAILY (Patient taking differently: Take 50 mg by mouth daily. ) 30  tablet 5  . atorvastatin (LIPITOR) 40 MG tablet TAKE 1 TABLET EVERY DAY (Patient taking differently: Take 40 mg by mouth daily at 6 PM. ) 90 tablet 2  . Blood Glucose Calibration (TRUE METRIX LEVEL 1) Low SOLN Use as directed Dx. E11.9 3 each 0  . cyanocobalamin 2000 MCG tablet Take 1 tablet (2,000 mcg total) by mouth daily. 90 tablet 3  . furosemide (LASIX) 20 MG tablet Take 1 tablet (20 mg total) by mouth daily. 30 tablet 3  . glucose blood (TRUE METRIX BLOOD GLUCOSE TEST) test strip 1 each by Other route 2 (two) times daily. Use to check blood  sugars twice a day. Dx E11.9 200 each 2  . latanoprost (XALATAN) 0.005 % ophthalmic solution Place 1 drop into both eyes at bedtime.  3  . potassium chloride SA (K-DUR,KLOR-CON) 20 MEQ tablet Take 2 tablets (40 mEq total) by mouth daily for 5 days. 10 tablet 0  . TRUEPLUS LANCETS 33G MISC Use to help check blood sugars twice a day. Dx E11.9 200 each 2   No current facility-administered medications on file prior to visit.     Past Medical History:  Diagnosis Date  . ANEMIA, NORMOCYTIC   . BREAST CYST, HX OF   . CARPAL TUNNEL SYNDROME, BILATERAL   . DIABETES MELLITUS, TYPE II   . Glaucoma   . HYPERLIPIDEMIA   . HYPERTENSION   . OBESITY   . PERIPHERAL NEUROPATHY   . RENAL FAILURE, CHRONIC     Past Surgical History:  Procedure Laterality Date  . CATARACT EXTRACTION BILATERAL W/ ANTERIOR VITRECTOMY  05/2010  . COLONOSCOPY     [facility]/7-8 years    Social History   Socioeconomic History  . Marital status: Widowed    Spouse name: Not on file  . Number of children: Not on file  . Years of education: Not on file  . Highest education level: Not on file  Occupational History  . Not on file  Social Needs  . Financial resource strain: Not on file  . Food insecurity:    Worry: Not on file    Inability: Not on file  . Transportation needs:    Medical: Not on file    Non-medical: Not on file  Tobacco Use  . Smoking status: Never Smoker  . Smokeless tobacco: Never Used  Substance and Sexual Activity  . Alcohol use: No  . Drug use: No  . Sexual activity: Not on file  Lifestyle  . Physical activity:    Days per week: Not on file    Minutes per session: Not on file  . Stress: Not on file  Relationships  . Social connections:    Talks on phone: Not on file    Gets together: Not on file    Attends religious service: Not on file    Active member of club or organization: Not on file    Attends meetings of clubs or organizations: Not on file    Relationship status: Not on file    Other Topics Concern  . 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 Arthritis Son   . Alcohol abuse Son   . Diabetes Son   . Hypertension Son   . Colon cancer Son 82  . Heart attack Mother        Died age 61s  . Heart attack Father  No details 80s  . Heart disease Sister        Slight heart attack, died age 75    Review of Systems  Constitutional: Positive for fatigue. Negative for chills and fever.  Respiratory: Negative for cough, shortness of breath and wheezing.   Cardiovascular: Negative for chest pain, palpitations and leg swelling.  Endocrine: Positive for cold intolerance.  Neurological: Negative for dizziness, light-headedness and headaches.  Psychiatric/Behavioral: Negative for confusion.       Objective:   Vitals:   01/13/18 1258  BP: (!) 90/48  Pulse: 71  Resp: 16  Temp: 97.6 F (36.4 C)  SpO2: 98%   BP Readings from Last 3 Encounters:  01/13/18 (!) 90/48  01/11/18 129/87  01/10/18 (!) 102/52   Wt Readings from Last 3 Encounters:  01/10/18 184 lb (83.5 kg)  12/19/17 191 lb 12.8 oz (87 kg)  12/14/17 213 lb (96.6 kg)   Body mass index is 27.17 kg/m.   Physical Exam    Constitutional: Appears chronically ill. No distress.  HENT:  Head: Normocephalic and atraumatic.  Neck: Neck supple. No tracheal deviation present. No thyromegaly present.  No cervical lymphadenopathy Cardiovascular: Normal rate, regular rhythm and normal heart sounds.   No murmur heard. No carotid bruit .  No edema Pulmonary/Chest: Effort normal and breath sounds normal. No respiratory distress. No has no wheezes. No rales.  Skin: Skin is warm and dry. Not diaphoretic.  Psychiatric: Normal mood and affect. Behavior is normal.      Assessment & Plan:    See Problem List for Assessment and Plan of chronic medical problems.

## 2018-01-12 NOTE — Telephone Encounter (Signed)
Rx for wheelchair has been faxed. Seeing pt tomorrow for visit.

## 2018-01-13 ENCOUNTER — Encounter: Payer: Self-pay | Admitting: Internal Medicine

## 2018-01-13 ENCOUNTER — Other Ambulatory Visit (INDEPENDENT_AMBULATORY_CARE_PROVIDER_SITE_OTHER): Payer: Medicare HMO

## 2018-01-13 ENCOUNTER — Ambulatory Visit (INDEPENDENT_AMBULATORY_CARE_PROVIDER_SITE_OTHER): Payer: Medicare HMO | Admitting: Internal Medicine

## 2018-01-13 VITALS — BP 90/48 | HR 71 | Temp 97.6°F | Resp 16 | Ht 69.0 in

## 2018-01-13 DIAGNOSIS — R5381 Other malaise: Secondary | ICD-10-CM | POA: Insufficient documentation

## 2018-01-13 DIAGNOSIS — E876 Hypokalemia: Secondary | ICD-10-CM | POA: Diagnosis not present

## 2018-01-13 DIAGNOSIS — N184 Chronic kidney disease, stage 4 (severe): Secondary | ICD-10-CM

## 2018-01-13 DIAGNOSIS — I10 Essential (primary) hypertension: Secondary | ICD-10-CM

## 2018-01-13 LAB — BASIC METABOLIC PANEL
BUN: 68 mg/dL — AB (ref 6–23)
CHLORIDE: 102 meq/L (ref 96–112)
CO2: 28 mEq/L (ref 19–32)
Calcium: 9.7 mg/dL (ref 8.4–10.5)
Creatinine, Ser: 2.34 mg/dL — ABNORMAL HIGH (ref 0.40–1.20)
GFR: 25.87 mL/min — AB (ref >60.00)
GLUCOSE: 114 mg/dL — AB (ref 70–99)
POTASSIUM: 3.9 meq/L (ref 3.5–5.1)
Sodium: 138 mEq/L (ref 135–145)

## 2018-01-13 MED ORDER — SODIUM ZIRCONIUM CYCLOSILICATE 5 G PO PACK: 10.0000 g | PACK | Freq: Every day | ORAL | Status: DC

## 2018-01-13 MED ORDER — SODIUM ZIRCONIUM CYCLOSILICATE 5 G PO PACK: 5.0000 g | PACK | Freq: Every day | ORAL | Status: DC

## 2018-01-13 NOTE — Assessment & Plan Note (Signed)
Management per Dr Posey Pronto at [facility]

## 2018-01-13 NOTE — Patient Instructions (Addendum)
  Tests ordered today. Your results will be released to [facility] (or called to you) after review, usually within 72hours after test completion. If any changes need to be made, you will be notified at that same time.   Medications reviewed and updated.  Changes include :    Stop the atenolol.   Stop the lasix for now.   Stop the potassium pills from ED.  Follow up with the kidney doctor regarding the lokelma, which lowers the potassium.      If she has any leg swelling or shortness of breath over the weekend she should restart the lasix.  If there are any questions over the weekend you can call the on-call doctor.     Please followup next wednesday

## 2018-01-13 NOTE — Assessment & Plan Note (Signed)
She needs a wheelchair - this is medically necessary Patient suffers from diabetic neuropathy, poor balance, low stamia and physical deconditioning which impairs their ability to perform daily activities like toileting, feeding, dressing, grooming, bathing. In the home. A can or walker will not resolve issue with performing activities of daily living. A wheelchair will allow patient to safely perform dialy activities. Patient can safely propel the wheel chair in the home or has a caregiver who can provide assistance.

## 2018-01-13 NOTE — Assessment & Plan Note (Signed)
Has been taking too high of a dose of lokelma - was supposed to be taking 5 g daily , but has been taking 10 g daily  - will start 5 g daily Stop potassium from ED Hold lasix Bmp today and will repeat on Wednesday -  Further instruction depending on result

## 2018-01-13 NOTE — Assessment & Plan Note (Signed)
Hypotensive here today, asymptomatic Stop atenolol Stop lasix - no edema and will try to hold  - monitor for edema and SOB F/u next Wednesday to check htn, fluid status, potassium

## 2018-01-17 NOTE — Progress Notes (Signed)
Subjective:    Patient ID: Erin Liu, female    DOB: May 18, 1940, 77 y.o.   MRN: [id number]  HPI The patient is here for follow up.  She is here with her daughter and granddaughter.  Chronic kidney disease, hyperkalemia: She follows with nephrology.  She is was taking lokelma to help lower her potassium, but since last week this was discontinued.  She was admitted last week for very low potassium it was discovered that she was taking too high of a dose of the lokelma.   We also discontinued the Lasix to see if she needed this.  She had also been taking potassium supplementation from the emergency room which we discontinued as well.  She is here for follow-up and recheck of her kidney function and potassium.  Hypertension: Her blood pressure was low when she was here last week.  The atenolol was discontinued.  Lasix was also discontinued last week.  Overall she states she feels well and has no concerns.  She denies any lightheadedness or dizziness.  She denies any chest pain, palpitations and obvious leg edema.  She denies shortness of breath, but she does have a sedentary lifestyle.  She has had some mild hoarseness and cough and her family think she may be coming down with a cold.  She denies fevers, chills, sinus pressure, shortness of breath and wheezing.  Medications and allergies reviewed with patient and updated if appropriate.  Patient Active Problem List   Diagnosis Date Noted  . Physical deconditioning 01/13/2018  . Hypokalemia 01/12/2018  . Tremor of both hands 10/10/2017  . Urinary incontinence 10/10/2017  . Vasovagal syncope 10/02/2017  . 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 B12 deficiency 04/11/2017  . Left flank pain 08/30/2015  . Cough 08/26/2015  . Lumbago 02/25/2015  . Essential tremor   . Glaucoma 01/08/2010  . Chronic kidney disease (CKD), stage IV (severe) ([place]) 12/10/2008  . DM (diabetes mellitus), secondary, uncontrolled, with renal  complications ([place]) 53/66/4403  . Diabetic neuropathy ([place]) 02/22/2006  . Dyslipidemia 02/22/2006  . OBESITY 02/22/2006  . 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 CARPAL TUNNEL SYNDROME, BILATERAL 02/22/2006  . Essential hypertension 02/22/2006  . CONSTIPATION 02/22/2006    Current Outpatient Medications on File Prior to Visit  Medication Sig Dispense Refill  . acetaminophen (TYLENOL) 325 MG tablet Take 2 tablets (650 mg total) by mouth every 6 (six) hours as needed for mild pain.    . Alcohol Swabs (B-D SINGLE USE SWABS REGULAR) PADS Use as directed. Dx E11.9 200 each 2  . allopurinol (ZYLOPRIM) 100 MG tablet Take 2 tablets (200 mg total) by mouth daily. 60 tablet 6  . atorvastatin (LIPITOR) 40 MG tablet TAKE 1 TABLET EVERY DAY (Patient taking differently: Take 40 mg by mouth daily at 6 PM. ) 90 tablet 2  . Blood Glucose Calibration (TRUE METRIX LEVEL 1) Low SOLN Use as directed Dx. E11.9 3 each 0  . cyanocobalamin 2000 MCG tablet Take 1 tablet (2,000 mcg total) by mouth daily. 90 tablet 3  . glucose blood (TRUE METRIX BLOOD GLUCOSE TEST) test strip 1 each by Other route 2 (two) times daily. Use to check blood sugars twice a day. Dx E11.9 200 each 2  . latanoprost (XALATAN) 0.005 % ophthalmic solution Place 1 drop into both eyes at bedtime.  3  . TRUEPLUS LANCETS 33G MISC Use to help check blood sugars twice a day. Dx E11.9 200 each 2   No  current facility-administered medications on file prior to visit.     Past Medical History:  Diagnosis Date  . ANEMIA, NORMOCYTIC   . BREAST CYST, HX OF   . CARPAL TUNNEL SYNDROME, BILATERAL   . DIABETES MELLITUS, TYPE II   . Glaucoma   . HYPERLIPIDEMIA   . HYPERTENSION   . OBESITY   . PERIPHERAL NEUROPATHY   . RENAL FAILURE, CHRONIC     Past Surgical History:  Procedure Laterality Date  . CATARACT EXTRACTION BILATERAL W/ ANTERIOR VITRECTOMY  05/2010  . COLONOSCOPY     [facility]/7-8 years    Social History   Socioeconomic History  . Marital  status: Widowed    Spouse name: Not on file  . Number of children: Not on file  . Years of education: Not on file  . Highest education level: Not on file  Occupational History  . Not on file  Social Needs  . Financial resource strain: Not on file  . Food insecurity:    Worry: Not on file    Inability: Not on file  . Transportation needs:    Medical: Not on file    Non-medical: Not on file  Tobacco Use  . Smoking status: Never Smoker  . Smokeless tobacco: Never Used  Substance and Sexual Activity  . Alcohol use: No  . Drug use: No  . Sexual activity: Not on file  Lifestyle  . Physical activity:    Days per week: Not on file    Minutes per session: Not on file  . Stress: Not on file  Relationships  . Social connections:    Talks on phone: Not on file    Gets together: Not on file    Attends religious service: Not on file    Active member of club or organization: Not on file    Attends meetings of clubs or organizations: Not on file    Relationship status: Not on file  Other Topics Concern  . 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 Arthritis Son   . Alcohol abuse Son   . Diabetes Son   . Hypertension Son   . Colon cancer Son 54  . Heart attack Mother        Died age 16s  . Heart attack Father        No details 32s  . Heart disease Sister        Slight heart attack, died age 38    Review of Systems  Constitutional: Negative for appetite change and fever.  HENT: Positive for voice change. Negative for congestion and sinus pressure.   Respiratory: Positive for cough (Dry cough). Negative for shortness of breath and wheezing.   Cardiovascular: Negative for chest pain, palpitations and leg swelling.  Neurological: Negative for light-headedness and headaches.       Objective:   Vitals:   01/18/18 1438  BP: (!) 102/48  Pulse: 86  Resp: 16  Temp: 97.7 F  (36.5 C)  SpO2: 99%   BP Readings from Last 3 Encounters:  01/18/18 (!) 102/48  01/13/18 (!) 90/48  01/11/18 129/87   Wt Readings from Last 3 Encounters:  01/18/18 186 lb (84.4 kg)  01/10/18 184 lb (83.5 kg)  12/19/17 191 lb 12.8 oz (87 kg)   Body mass index is 27.47 kg/m.   Physical Exam    Constitutional: Chronically ill-appearing. No  distress.  HENT:  Head: Normocephalic and atraumatic.  Neck: Neck supple. No tracheal deviation present. No thyromegaly present.  No cervical lymphadenopathy Cardiovascular: Normal rate, regular rhythm and normal heart sounds.     No edema Pulmonary/Chest: Effort normal and breath sounds normal. No respiratory distress. No has no wheezes. No rales.  Skin: Skin is warm and dry. Not diaphoretic.  Psychiatric: Normal mood and affect. Behavior is normal.      Assessment & Plan:    See Problem List for Assessment and Plan of chronic medical problems.

## 2018-01-18 ENCOUNTER — Ambulatory Visit (INDEPENDENT_AMBULATORY_CARE_PROVIDER_SITE_OTHER): Payer: Medicare HMO | Admitting: Internal Medicine

## 2018-01-18 ENCOUNTER — Other Ambulatory Visit (INDEPENDENT_AMBULATORY_CARE_PROVIDER_SITE_OTHER): Payer: Medicare HMO

## 2018-01-18 ENCOUNTER — Encounter: Payer: Self-pay | Admitting: Internal Medicine

## 2018-01-18 VITALS — BP 102/48 | HR 86 | Temp 97.7°F | Resp 16 | Ht 69.0 in | Wt 186.0 lb

## 2018-01-18 DIAGNOSIS — E1365 Other specified diabetes mellitus with hyperglycemia: Secondary | ICD-10-CM

## 2018-01-18 DIAGNOSIS — J069 Acute upper respiratory infection, unspecified: Secondary | ICD-10-CM

## 2018-01-18 DIAGNOSIS — I10 Essential (primary) hypertension: Secondary | ICD-10-CM | POA: Diagnosis not present

## 2018-01-18 DIAGNOSIS — N184 Chronic kidney disease, stage 4 (severe): Secondary | ICD-10-CM | POA: Diagnosis not present

## 2018-01-18 DIAGNOSIS — E1329 Other specified diabetes mellitus with other diabetic kidney complication: Secondary | ICD-10-CM | POA: Diagnosis not present

## 2018-01-18 DIAGNOSIS — IMO0002 Reserved for concepts with insufficient information to code with codable children: Secondary | ICD-10-CM

## 2018-01-18 LAB — COMPREHENSIVE METABOLIC PANEL
ALK PHOS: 64 U/L (ref 39–117)
ALT: 12 U/L (ref 0–35)
AST: 24 U/L (ref 0–37)
Albumin: 3.6 g/dL (ref 3.5–5.2)
BILIRUBIN TOTAL: 0.6 mg/dL (ref 0.2–1.2)
BUN: 45 mg/dL — AB (ref 6–23)
CO2: 28 meq/L (ref 19–32)
CREATININE: 2.69 mg/dL — AB (ref 0.40–1.20)
Calcium: 9.6 mg/dL (ref 8.4–10.5)
Chloride: 102 mEq/L (ref 96–112)
GFR: 22.03 mL/min — ABNORMAL LOW (ref >60.00)
GLUCOSE: 150 mg/dL — AB (ref 70–99)
Potassium: 4.3 mEq/L (ref 3.5–5.1)
Sodium: 138 mEq/L (ref 135–145)
TOTAL PROTEIN: 7.2 g/dL (ref 6.0–8.3)

## 2018-01-18 LAB — PHOSPHORUS: Phosphorus: 2.8 mg/dL (ref 2.3–4.6)

## 2018-01-18 LAB — CBC WITH DIFFERENTIAL/PLATELET
BASOS ABS: 0 10*3/uL (ref 0.0–0.1)
Basophils Relative: 0.7 % (ref 0.0–3.0)
EOS PCT: 2.8 % (ref 0.0–5.0)
Eosinophils Absolute: 0.2 10*3/uL (ref 0.0–0.7)
HCT: 27.2 % — ABNORMAL LOW (ref 36.0–46.0)
HEMOGLOBIN: 9 g/dL — AB (ref 12.0–15.0)
LYMPHS ABS: 1.5 10*3/uL (ref 0.7–4.0)
Lymphocytes Relative: 21.4 % (ref 12.0–46.0)
MCHC: 33 g/dL (ref 30.0–36.0)
MCV: 91.1 fl (ref 78.0–100.0)
MONO ABS: 0.7 10*3/uL (ref 0.1–1.0)
MONOS PCT: 10.7 % (ref 3.0–12.0)
NEUTROS PCT: 64.4 % (ref 43.0–77.0)
Neutro Abs: 4.5 10*3/uL (ref 1.4–7.7)
Platelets: 228 10*3/uL (ref 150.0–400.0)
RBC: 2.98 Mil/uL — AB (ref 3.87–5.11)
RDW: 18.9 % — ABNORMAL HIGH (ref 11.5–15.5)
WBC: 7 10*3/uL (ref 4.0–10.5)

## 2018-01-18 LAB — VITAMIN D 25 HYDROXY (VIT D DEFICIENCY, FRACTURES): VITD: 9.9 ng/mL — AB (ref 30.00–100.00)

## 2018-01-18 NOTE — Assessment & Plan Note (Signed)
Currently on no medication In the past few weeks her amlodipine and atenolol and Lasix have all been discontinued Blood pressure on low side today Monitor off of medication

## 2018-01-18 NOTE — Assessment & Plan Note (Signed)
Sugars are diet controlled  Lab Results  Component Value Date   HGBA1C 6.5 01/10/2018   Recent A1c 6.5% We will continue to monitor

## 2018-01-18 NOTE — Assessment & Plan Note (Signed)
Last week Lasix was discontinued and lokelma discontinued Following with Dr. Posey Pronto at [place] check CMP, phosphorus, PTH, vitamin D, CBC today-we will forward results to Dr. Posey Pronto

## 2018-01-18 NOTE — Patient Instructions (Signed)
  Medications reviewed and updated.  Changes include :   none    Please followup in 6 months   

## 2018-01-18 NOTE — Assessment & Plan Note (Signed)
Current cold symptoms likely viral in nature Over-the-counter cold medications as needed Monitor closely Call if no improvement or symptoms worsen

## 2018-01-19 LAB — PTH, INTACT AND CALCIUM
Calcium: 9.4 mg/dL (ref 8.6–10.4)
PTH: 113 pg/mL — ABNORMAL HIGH (ref 14–64)

## 2018-01-19 MED ORDER — VITAMIN D (ERGOCALCIFEROL) 1.25 MG (50000 UNIT) PO CAPS: 50000.0000 [IU] | ORAL_CAPSULE | ORAL | 1 refills | Status: DC

## 2018-01-27 DIAGNOSIS — R5381 Other malaise: Secondary | ICD-10-CM | POA: Diagnosis not present

## 2018-01-27 DIAGNOSIS — N184 Chronic kidney disease, stage 4 (severe): Secondary | ICD-10-CM | POA: Diagnosis not present

## 2018-02-10 ENCOUNTER — Other Ambulatory Visit (HOSPITAL_COMMUNITY): Payer: Medicare HMO

## 2018-02-10 ENCOUNTER — Encounter (HOSPITAL_COMMUNITY): Payer: Medicare HMO

## 2018-02-10 ENCOUNTER — Encounter: Payer: Medicare HMO | Admitting: Vascular Surgery

## 2018-02-13 ENCOUNTER — Ambulatory Visit: Payer: Self-pay | Admitting: *Deleted

## 2018-02-13 NOTE — Telephone Encounter (Signed)
Attempted to call pt's daughter regarding her medications. Unable to contact her. Left message to call the office back in the morning.

## 2018-02-14 NOTE — Telephone Encounter (Signed)
Wrong practice

## 2018-02-15 ENCOUNTER — Other Ambulatory Visit: Payer: Self-pay | Admitting: Internal Medicine

## 2018-02-15 NOTE — Telephone Encounter (Signed)
Copied from [place] [phone]. Topic: Quick Communication - Rx Refill/Question >> Feb 15, 2018  1:59 PM Bea Graff, NT wrote: Medication: atorvastatin (LIPITOR) 40 MG tablet   Has the patient contacted their pharmacy? Yes.   (Agent: If no, request that the patient contact the pharmacy for the refill.) (Agent: If yes, when and what did the pharmacy advise?)  Preferred Pharmacy (with phone number or street name): [facility] [phone] - [place], [place] - [phone] (Phone) [phone] (Fax)    Agent: Please be advised that RX refills may take up to 3 business days. We ask that you follow-up with your pharmacy.

## 2018-02-16 MED ORDER — ATORVASTATIN CALCIUM 40 MG PO TABS: 40.0000 mg | ORAL_TABLET | Freq: Every day | ORAL | 2 refills | Status: DC

## 2018-02-20 DIAGNOSIS — D631 Anemia in chronic kidney disease: Secondary | ICD-10-CM | POA: Diagnosis not present

## 2018-02-20 DIAGNOSIS — I129 Hypertensive chronic kidney disease with stage 1 through stage 4 chronic kidney disease, or unspecified chronic kidney disease: Secondary | ICD-10-CM | POA: Diagnosis not present

## 2018-02-20 DIAGNOSIS — N2581 Secondary hyperparathyroidism of renal origin: Secondary | ICD-10-CM | POA: Diagnosis not present

## 2018-02-20 DIAGNOSIS — N184 Chronic kidney disease, stage 4 (severe): Secondary | ICD-10-CM | POA: Diagnosis not present

## 2018-02-26 DIAGNOSIS — R5381 Other malaise: Secondary | ICD-10-CM | POA: Diagnosis not present

## 2018-02-26 DIAGNOSIS — N184 Chronic kidney disease, stage 4 (severe): Secondary | ICD-10-CM | POA: Diagnosis not present

## 2018-02-27 ENCOUNTER — Telehealth: Payer: Self-pay | Admitting: Internal Medicine

## 2018-02-27 NOTE — Telephone Encounter (Signed)
Attempted to call patient to schedule AWV. Could not leave message because voicemail is full. Will try to call patient at a later time. SF

## 2018-03-04 ENCOUNTER — Other Ambulatory Visit: Payer: Self-pay | Admitting: Internal Medicine

## 2018-03-20 ENCOUNTER — Ambulatory Visit: Payer: Medicare HMO

## 2018-03-29 DIAGNOSIS — R5381 Other malaise: Secondary | ICD-10-CM | POA: Diagnosis not present

## 2018-03-29 DIAGNOSIS — N184 Chronic kidney disease, stage 4 (severe): Secondary | ICD-10-CM | POA: Diagnosis not present

## 2018-03-31 ENCOUNTER — Inpatient Hospital Stay (HOSPITAL_COMMUNITY): Admission: RE | Admit: 2018-03-31 | Payer: Medicare HMO | Source: Ambulatory Visit

## 2018-03-31 ENCOUNTER — Other Ambulatory Visit (HOSPITAL_COMMUNITY): Payer: Medicare HMO

## 2018-03-31 ENCOUNTER — Encounter: Payer: Medicare HMO | Admitting: Vascular Surgery

## 2018-03-31 NOTE — Progress Notes (Addendum)
Subjective:   Erin Liu is a 78 y.o. female who presents for Medicare Annual (Subsequent) preventive examination.  Review of Systems:  No ROS.  Medicare Wellness Visit. Additional risk factors are reflected in the social history.  Cardiac Risk Factors include: advanced age (>78men, >31 women);diabetes mellitus;hypertension;dyslipidemia;sedentary lifestyle Sleep patterns: has interrupted sleep, gets up 1-2 times nightly to void and sleeps hours varies nightly. Patient reports insomnia issues, discussed recommended sleep tips. Relevant patient education assigned to patient using Emmi.  Home Safety/Smoke Alarms: Feels safe in home. Smoke alarms in place.  Living environment; residence and Firearm Safety: 1-story house/ trailer, equipment: Cane, Type: [address] [place], [place], Type: Conservation officer, nature and [employer], Type: Tub Surveyor, quantity, no firearms. Lives with son, no needs for DME, good support system Seat Belt Safety/Bike Helmet: Wears seat belt.     Objective:     Vitals: BP 134/82   Pulse 88   Temp 98.5 F (36.9 C)   Resp 17   Ht 5\' 9"  (1.753 m)   Wt 186 lb (84.4 kg)   SpO2 99%   BMI 27.47 kg/m   Body mass index is 27.47 kg/m.  Advanced Directives 04/03/2018 10/02/2017 10/02/2017 05/10/2016 04/26/2016  Does Patient Have a Medical Advance Directive? No No No No No  Would patient like information on creating a medical advance directive? Yes (ED - Information included in AVS) No - Patient declined - - No - Patient declined    Tobacco Social History   Tobacco Use  Smoking Status Never Smoker  Smokeless Tobacco Never Used     Counseling given: Not Answered  Past Medical History:  Diagnosis Date  . ANEMIA, NORMOCYTIC   . BREAST CYST, HX OF   . CARPAL TUNNEL SYNDROME, BILATERAL   . DIABETES MELLITUS, TYPE II   . Glaucoma   . HYPERLIPIDEMIA   . HYPERTENSION   . OBESITY   . PERIPHERAL NEUROPATHY   . RENAL FAILURE, CHRONIC    Past Surgical History:  Procedure  Laterality Date  . CATARACT EXTRACTION BILATERAL W/ ANTERIOR VITRECTOMY  05/2010  . COLONOSCOPY     [facility]/7-8 years   Family History  Problem Relation Age of Onset  . Arthritis Son   . Alcohol abuse Son   . Diabetes Son   . Hypertension Son   . Colon cancer Son 80  . Heart attack Mother        Died age 94s  . Heart attack Father        No details 30s  . Heart disease Sister        Slight heart attack, died age 46   Social History   Socioeconomic History  . Marital status: Widowed    Spouse name: Not on file  . Number of children: 3  . Years of education: Not on file  . Highest education level: Not on file  Occupational History  . Not on file  Social Needs  . Financial resource strain: Not hard at all  . Food insecurity:    Worry: Never true    Inability: Never true  . Transportation needs:    Medical: No    Non-medical: No  Tobacco Use  . Smoking status: Never Smoker  . Smokeless tobacco: Never Used  Substance and Sexual Activity  . Alcohol use: No  . Drug use: No  . Sexual activity: Not Currently  Lifestyle  . Physical activity:    Days per week: 0 days    Minutes per  session: 0 min  . Stress: Not at all  Relationships  . Social connections:    Talks on phone: More than three times a week    Gets together: Once a week    Attends religious service: 1 to 4 times per year    Active member of club or organization: Not on file    Attends meetings of clubs or organizations: Not on file    Relationship status: Not on file  Other Topics Concern  . 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 acetaminophen (TYLENOL) 325 MG tablet Take 2 tablets (650 mg total) by mouth every 6 (six) hours as needed for mild pain.  . Alcohol Swabs (B-D SINGLE USE SWABS REGULAR) PADS Use as directed. Dx E11.9  . allopurinol (ZYLOPRIM) 100 MG tablet TAKE 2 TABLETS BY  MOUTH DAILY  . atorvastatin (LIPITOR) 40 MG tablet Take 1 tablet (40 mg total) by mouth daily.  . Blood Glucose Calibration (TRUE METRIX LEVEL 1) Low SOLN Use as directed Dx. E11.9  . cyanocobalamin 2000 MCG tablet Take 1 tablet (2,000 mcg total) by mouth daily.  Marland Kitchen glucose blood (TRUE METRIX BLOOD GLUCOSE TEST) test strip 1 each by Other route 2 (two) times daily. Use to check blood sugars twice a day. Dx E11.9  . latanoprost (XALATAN) 0.005 % ophthalmic solution Place 1 drop into both eyes at bedtime.  . TRUEPLUS LANCETS 33G MISC Use to help check blood sugars twice a day. Dx E11.9  . [DISCONTINUED] Vitamin D, Ergocalciferol, (DRISDOL) 1.25 MG (50000 UT) CAPS capsule Take 1 capsule (50,000 Units total) by mouth every 7 (seven) days. (Patient not taking: Reported on 04/03/2018)   No facility-administered encounter medications on file as of 04/03/2018.     Activities of Daily Living In your present state of health, do you have any difficulty performing the following activities: 04/03/2018 10/02/2017  Hearing? N N  Vision? N N  Difficulty concentrating or making decisions? Y N  Walking or climbing stairs? Y N  Dressing or bathing? N N  Doing errands, shopping? Tempie Donning  Preparing Food and eating ? N -  Using the Toilet? N -  In the past six months, have you accidently leaked urine? N -  Do you have problems with loss of bowel control? N -  Managing your Medications? Y -  Managing your Finances? Y -  Housekeeping or managing your Housekeeping? Y -  Some recent data might be hidden    Patient Care Team: Binnie Rail, MD as PCP - General (Internal Medicine) Hayden Pedro, MD (Ophthalmology) Milus Banister, MD (Gastroenterology) Elmarie Shiley, MD (Nephrology)    Assessment:   This is a routine wellness examination for Erin Liu. Physical assessment deferred to PCP.  Exercise Activities and Dietary recommendations Current Exercise Habits: The patient does not participate in regular exercise  at present(chair exercise print-out provided), Exercise limited by: orthopedic condition(s)   Diet (meal preparation, eat out, water intake, caffeinated beverages, dairy products, fruits and vegetables): in general, a "healthy" diet  , well balanced   Reviewed heart healthy and diabetic diet. Encouraged patient to increase daily water and healthy fluid intake.  Goals   None     Fall Risk Fall Risk  04/03/2018 04/11/2017 02/25/2015 10/15/2013 05/10/2012  Falls in the past year? 0 Yes No No No  Number falls in past yr: -  2 or more - - -  Injury with Fall? - No - - -  Risk for fall due to : Impaired balance/gait;Impaired mobility - - - -  Follow up Falls prevention discussed;Education provided - - - -   Depression Screen PHQ 2/9 Scores 04/03/2018 04/11/2017 02/25/2015 10/15/2013  PHQ - 2 Score 0 0 1 0     Cognitive Function MMSE - Mini Mental State Exam 04/03/2018  Orientation to time 4  Orientation to Place 5  Registration 3  Attention/ Calculation 4  Recall 1  Language- name 2 objects 2  Language- repeat 1  Language- follow 3 step command 3  Language- read & follow direction 1  Write a sentence 1  Copy design 0  Total score 25        Immunization History  Administered Date(s) Administered  . Influenza Split 01/12/2011  . Influenza Whole 01/17/2007, 12/10/2008, 01/08/2010  . Influenza, High Dose Seasonal PF 12/11/2016, 12/19/2017  . Influenza,inj,Quad PF,6+ Mos 11/10/2012  . Influenza-Unspecified 12/07/2011, 12/22/2013, 01/08/2015  . Pneumococcal Conjugate-13 09/25/2013  . Pneumococcal Polysaccharide-23 09/11/2010  . Tdap 09/11/2010   Screening Tests Health Maintenance  Topic Date Due  . OPHTHALMOLOGY EXAM  05/02/2015  . FOOT EXAM  04/11/2018  . HEMOGLOBIN A1C  07/11/2018  . TETANUS/TDAP  09/10/2020  . DEXA SCAN  04/25/2022  . INFLUENZA VACCINE  Completed  . PNA vac Low Risk Adult  Completed      Plan:     An appointment was scheduled to see PCP today as patient  states she has had a cough for approximately 1 month and the cough is not getting better.   Reviewed health maintenance screenings with patient today and relevant education, vaccines, and/or referrals were provided.  Patient states she saw Dr. Katy Fitch within the last year. Nurse will request eye exam results.   Continue doing brain stimulating activities (puzzles, reading, adult coloring books, staying active) to keep memory sharp.   Continue to eat heart healthy diet (full of fruits, vegetables, whole grains, lean protein, water--limit salt, fat, and sugar intake) and increase physical activity as tolerated.  I have personally reviewed and noted the following in the patient's chart:   . Medical and social history . Use of alcohol, tobacco or illicit drugs  . Current medications and supplements . Functional ability and status . Nutritional status . Physical activity . Advanced directives . List of other physicians . Vitals . Screenings to include cognitive, depression, and falls . Referrals and appointments  In addition, I have reviewed and discussed with patient certain preventive protocols, quality metrics, and best practice recommendations. A written personalized care plan for preventive services as well as general preventive health recommendations were provided to patient.     Michiel Cowboy, RN  04/03/2018    Medical screening examination/treatment/procedure(s) were performed by non-physician practitioner and as supervising physician I was immediately available for consultation/collaboration. I agree with above. Binnie Rail, MD

## 2018-04-02 ENCOUNTER — Other Ambulatory Visit: Payer: Self-pay | Admitting: Internal Medicine

## 2018-04-03 ENCOUNTER — Ambulatory Visit: Payer: Medicare HMO | Admitting: *Deleted

## 2018-04-03 ENCOUNTER — Ambulatory Visit (INDEPENDENT_AMBULATORY_CARE_PROVIDER_SITE_OTHER): Payer: Medicare HMO | Admitting: Internal Medicine

## 2018-04-03 ENCOUNTER — Encounter: Payer: Self-pay | Admitting: Internal Medicine

## 2018-04-03 VITALS — BP 134/82 | HR 88 | Temp 98.5°F | Resp 17 | Ht 69.0 in | Wt 186.0 lb

## 2018-04-03 VITALS — BP 136/68 | HR 95 | Temp 97.8°F | Resp 16 | Ht 69.0 in | Wt 186.0 lb

## 2018-04-03 DIAGNOSIS — E1365 Other specified diabetes mellitus with hyperglycemia: Secondary | ICD-10-CM

## 2018-04-03 DIAGNOSIS — E1329 Other specified diabetes mellitus with other diabetic kidney complication: Secondary | ICD-10-CM | POA: Diagnosis not present

## 2018-04-03 DIAGNOSIS — IMO0002 Reserved for concepts with insufficient information to code with codable children: Secondary | ICD-10-CM

## 2018-04-03 DIAGNOSIS — Z0000 Encounter for general adult medical examination without abnormal findings: Secondary | ICD-10-CM

## 2018-04-03 DIAGNOSIS — R05 Cough: Secondary | ICD-10-CM | POA: Diagnosis not present

## 2018-04-03 DIAGNOSIS — R059 Cough, unspecified: Secondary | ICD-10-CM

## 2018-04-03 MED ORDER — AZITHROMYCIN 250 MG PO TABS: ORAL_TABLET | ORAL | 0 refills | Status: DC

## 2018-04-03 NOTE — Progress Notes (Signed)
Subjective:    Patient ID: Erin Liu, female    DOB: [birth year], 78 y.o.   MRN: [id number]  HPI She is here for an acute visit for cold symptoms.  She is here with a family member who helps provide the history.  Her symptoms started one month ago.  She is experiencing a cough.  She occasionally get sputum up.  She has had occasional wheezing and she does have drainage in the back of her throat.  She denies any fever, chills, nasal congestion, ear pain, sinus pain, sore throat and shortness of breath.  She denies any headaches.  Overall she is a poor historian.  She denies any GERD symptoms.  She has taken Robitussin.   Medications and allergies reviewed with patient and updated if appropriate.  Patient Active Problem List   Diagnosis Date Noted  . Physical deconditioning 01/13/2018  . Hypokalemia 01/12/2018  . Tremor of both hands 10/10/2017  . Urinary incontinence 10/10/2017  . Vasovagal syncope 10/02/2017  . 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 B12 deficiency 04/11/2017  . Left flank pain 08/30/2015  . Cough 08/26/2015  . Lumbago 02/25/2015  . Essential tremor   . Glaucoma 01/08/2010  . Chronic kidney disease (CKD), stage IV (severe) ([place]) 12/10/2008  . URI (upper respiratory infection) 06/28/2006  . DM (diabetes mellitus), secondary, uncontrolled, with renal complications ([place]) 09/32/3557  . Diabetic neuropathy ([place]) 02/22/2006  . Dyslipidemia 02/22/2006  . 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 CARPAL TUNNEL SYNDROME, BILATERAL 02/22/2006  . Essential hypertension 02/22/2006  . CONSTIPATION 02/22/2006    Current Outpatient Medications on File Prior to Visit  Medication Sig Dispense Refill  . acetaminophen (TYLENOL) 325 MG tablet Take 2 tablets (650 mg total) by mouth every 6 (six) hours as needed for mild pain.    . Alcohol Swabs (B-D SINGLE USE SWABS REGULAR) PADS Use as directed. Dx E11.9 200 each 2  . allopurinol (ZYLOPRIM) 100 MG tablet  TAKE 2 TABLETS BY MOUTH DAILY 60 tablet 5  . atorvastatin (LIPITOR) 40 MG tablet Take 1 tablet (40 mg total) by mouth daily. 90 tablet 2  . Blood Glucose Calibration (TRUE METRIX LEVEL 1) Low SOLN Use as directed Dx. E11.9 3 each 0  . cyanocobalamin 2000 MCG tablet Take 1 tablet (2,000 mcg total) by mouth daily. 90 tablet 3  . glucose blood (TRUE METRIX BLOOD GLUCOSE TEST) test strip 1 each by Other route 2 (two) times daily. Use to check blood sugars twice a day. Dx E11.9 200 each 2  . latanoprost (XALATAN) 0.005 % ophthalmic solution Place 1 drop into both eyes at bedtime.  3  . TRUEPLUS LANCETS 33G MISC Use to help check blood sugars twice a day. Dx E11.9 200 each 2   No current facility-administered medications on file prior to visit.     Past Medical History:  Diagnosis Date  . ANEMIA, NORMOCYTIC   . BREAST CYST, HX OF   . CARPAL TUNNEL SYNDROME, BILATERAL   . DIABETES MELLITUS, TYPE II   . Glaucoma   . HYPERLIPIDEMIA   . HYPERTENSION   . OBESITY   . PERIPHERAL NEUROPATHY   . RENAL FAILURE, CHRONIC     Past Surgical History:  Procedure Laterality Date  . CATARACT EXTRACTION BILATERAL W/ ANTERIOR VITRECTOMY  05/2010  . COLONOSCOPY     [facility]/7-8 years    Social History   Socioeconomic History  . Marital status: Widowed    Spouse name: Not  on file  . Number of children: 3  . Years of education: Not on file  . Highest education level: Not on file  Occupational History  . Not on file  Social Needs  . Financial resource strain: Not hard at all  . Food insecurity:    Worry: Never true    Inability: Never true  . Transportation needs:    Medical: No    Non-medical: No  Tobacco Use  . Smoking status: Never Smoker  . Smokeless tobacco: Never Used  Substance and Sexual Activity  . Alcohol use: No  . Drug use: No  . Sexual activity: Not Currently  Lifestyle  . Physical activity:    Days per week: 0 days    Minutes per session: 0 min  . Stress: Not at all    Relationships  . Social connections:    Talks on phone: More than three times a week    Gets together: Not on file    Attends religious service: Not on file    Active member of club or organization: Not on file    Attends meetings of clubs or organizations: Not on file    Relationship status: Not on file  Other Topics Concern  . 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 Arthritis Son   . Alcohol abuse Son   . Diabetes Son   . Hypertension Son   . Colon cancer Son 46  . Heart attack Mother        Died age 27s  . Heart attack Father        No details 50s  . Heart disease Sister        Slight heart attack, died age 64    Review of Systems  Constitutional: Negative for chills and fever.  HENT: Positive for postnasal drip. Negative for congestion, ear pain, sinus pain and sore throat.   Respiratory: Positive for cough (occ sputum) and wheezing. Negative for shortness of breath.   Neurological: Negative for headaches.       Objective:   Vitals:   04/03/18 1533  BP: 136/68  Pulse: 95  Resp: 16  Temp: 97.8 F (36.6 C)  SpO2: 99%   Filed Weights   04/03/18 1533  Weight: 186 lb (84.4 kg)   Body mass index is 27.47 kg/m.  Wt Readings from Last 3 Encounters:  04/03/18 186 lb (84.4 kg)  04/03/18 186 lb (84.4 kg)  01/18/18 186 lb (84.4 kg)     Physical Exam GENERAL APPEARANCE: Appears stated age, well appearing, NAD EYES: conjunctiva clear, no icterus HEENT: bilateral tympanic membranes and ear canals normal, oropharynx with mild erythema, no thyromegaly, trachea midline, no cervical or supraclavicular lymphadenopathy LUNGS: unlabored breathing, good air entry bilaterally, occasional fine wheeze, no crackles CARDIOVASCULAR: Normal S1,S2 without murmurs, no edema SKIN: warm, dry        Assessment & Plan:   See Problem List for Assessment and Plan of chronic  medical problems.

## 2018-04-03 NOTE — Patient Instructions (Signed)
Take the antibiotic as prescribed.  Use robitussin as needed.    Call if no improvement

## 2018-04-03 NOTE — Telephone Encounter (Signed)
Lasix was stopped per your OV from 01/13/18. Please advise.

## 2018-04-03 NOTE — Patient Instructions (Signed)
Continue doing brain stimulating activities (puzzles, reading, adult coloring books, staying active) to keep memory sharp.   Continue to eat heart healthy diet (full of fruits, vegetables, whole grains, lean protein, water--limit salt, fat, and sugar intake) and increase physical activity as tolerated.  Health Maintenance, Female Adopting a healthy lifestyle and getting preventive care can go a long way to promote health and wellness. Talk with your health care provider about what schedule of regular examinations is right for you. This is a good chance for you to check in with your provider about disease prevention and staying healthy. In between checkups, there are plenty of things you can do on your own. Experts have done a lot of research about which lifestyle changes and preventive measures are most likely to keep you healthy. Ask your health care provider for more information. Weight and diet Eat a healthy diet  Be sure to include plenty of vegetables, fruits, low-fat dairy products, and lean protein.  Do not eat a lot of foods high in solid fats, added sugars, or salt.  Get regular exercise. This is one of the most important things you can do for your health. ? Most adults should exercise for at least 150 minutes each week. The exercise should increase your heart rate and make you sweat (moderate-intensity exercise). ? Most adults should also do strengthening exercises at least twice a week. This is in addition to the moderate-intensity exercise. Maintain a healthy weight  Body mass index (BMI) is a measurement that can be used to identify possible weight problems. It estimates body fat based on height and weight. Your health care provider can help determine your BMI and help you achieve or maintain a healthy weight.  For females 20 years of age and older: ? A BMI below 18.5 is considered underweight. ? A BMI of 18.5 to 24.9 is normal. ? A BMI of 25 to 29.9 is considered  overweight. ? A BMI of 30 and above is considered obese. Watch levels of cholesterol and blood lipids  You should start having your blood tested for lipids and cholesterol at 78 years of age, then have this test every 5 years.  You may need to have your cholesterol levels checked more often if: ? Your lipid or cholesterol levels are high. ? You are older than 78 years of age. ? You are at high risk for heart disease. Cancer screening Lung Cancer  Lung cancer screening is recommended for adults 55-80 years old who are at high risk for lung cancer because of a history of smoking.  A yearly low-dose CT scan of the lungs is recommended for people who: ? Currently smoke. ? Have quit within the past 15 years. ? Have at least a 30-pack-year history of smoking. A pack year is smoking an average of one pack of cigarettes a day for 1 year.  Yearly screening should continue until it has been 15 years since you quit.  Yearly screening should stop if you develop a health problem that would prevent you from having lung cancer treatment. Breast Cancer  Practice breast self-awareness. This means understanding how your breasts normally appear and feel.  It also means doing regular breast self-exams. Let your health care provider know about any changes, no matter how small.  If you are in your 20s or 30s, you should have a clinical breast exam (CBE) by a health care provider every 1-3 years as part of a regular health exam.  If you are 40 or   older, have a CBE every year. Also consider having a breast X-ray (mammogram) every year.  If you have a family history of breast cancer, talk to your health care provider about genetic screening.  If you are at high risk for breast cancer, talk to your health care provider about having an MRI and a mammogram every year.  Breast cancer gene (BRCA) assessment is recommended for women who have family members with BRCA-related cancers. BRCA-related cancers  include: ? Breast. ? Ovarian. ? Tubal. ? Peritoneal cancers.  Results of the assessment will determine the need for genetic counseling and BRCA1 and BRCA2 testing. Cervical Cancer Your health care provider may recommend that you be screened regularly for cancer of the pelvic organs (ovaries, uterus, and vagina). This screening involves a pelvic examination, including checking for microscopic changes to the surface of your cervix (Pap test). You may be encouraged to have this screening done every 3 years, beginning at age 21.  For women ages 30-65, health care providers may recommend pelvic exams and Pap testing every 3 years, or they may recommend the Pap and pelvic exam, combined with testing for human papilloma virus (HPV), every 5 years. Some types of HPV increase your risk of cervical cancer. Testing for HPV may also be done on women of any age with unclear Pap test results.  Other health care providers may not recommend any screening for nonpregnant women who are considered low risk for pelvic cancer and who do not have symptoms. Ask your health care provider if a screening pelvic exam is right for you.  If you have had past treatment for cervical cancer or a condition that could lead to cancer, you need Pap tests and screening for cancer for at least 20 years after your treatment. If Pap tests have been discontinued, your risk factors (such as having a new sexual partner) need to be reassessed to determine if screening should resume. Some women have medical problems that increase the chance of getting cervical cancer. In these cases, your health care provider may recommend more frequent screening and Pap tests. Colorectal Cancer  This type of cancer can be detected and often prevented.  Routine colorectal cancer screening usually begins at 78 years of age and continues through 78 years of age.  Your health care provider may recommend screening at an earlier age if you have risk factors for  colon cancer.  Your health care provider may also recommend using home test kits to check for hidden blood in the stool.  A small camera at the end of a tube can be used to examine your colon directly (sigmoidoscopy or colonoscopy). This is done to check for the earliest forms of colorectal cancer.  Routine screening usually begins at age 50.  Direct examination of the colon should be repeated every 5-10 years through 78 years of age. However, you may need to be screened more often if early forms of precancerous polyps or small growths are found. Skin Cancer  Check your skin from head to toe regularly.  Tell your health care provider about any new moles or changes in moles, especially if there is a change in a mole's shape or color.  Also tell your health care provider if you have a mole that is larger than the size of a pencil eraser.  Always use sunscreen. Apply sunscreen liberally and repeatedly throughout the day.  Protect yourself by wearing long sleeves, pants, a wide-brimmed hat, and sunglasses whenever you are outside. Heart disease, diabetes,   and high blood pressure  High blood pressure causes heart disease and increases the risk of stroke. High blood pressure is more likely to develop in: ? People who have blood pressure in the high end of the normal range (130-139/85-89 mm Hg). ? People who are overweight or obese. ? People who are African American.  If you are 18-39 years of age, have your blood pressure checked every 3-5 years. If you are 40 years of age or older, have your blood pressure checked every year. You should have your blood pressure measured twice-once when you are at a hospital or clinic, and once when you are not at a hospital or clinic. Record the average of the two measurements. To check your blood pressure when you are not at a hospital or clinic, you can use: ? An automated blood pressure machine at a pharmacy. ? A home blood pressure monitor.  If you are  between 55 years and 79 years old, ask your health care provider if you should take aspirin to prevent strokes.  Have regular diabetes screenings. This involves taking a blood sample to check your fasting blood sugar level. ? If you are at a normal weight and have a low risk for diabetes, have this test once every three years after 78 years of age. ? If you are overweight and have a high risk for diabetes, consider being tested at a younger age or more often. Preventing infection Hepatitis B  If you have a higher risk for hepatitis B, you should be screened for this virus. You are considered at high risk for hepatitis B if: ? You were born in a country where hepatitis B is common. Ask your health care provider which countries are considered high risk. ? Your parents were born in a high-risk country, and you have not been immunized against hepatitis B (hepatitis B vaccine). ? You have HIV or AIDS. ? You use needles to inject street drugs. ? You live with someone who has hepatitis B. ? You have had sex with someone who has hepatitis B. ? You get hemodialysis treatment. ? You take certain medicines for conditions, including cancer, organ transplantation, and autoimmune conditions. Hepatitis C  Blood testing is recommended for: ? Everyone born from 1945 through 1965. ? Anyone with known risk factors for hepatitis C. Sexually transmitted infections (STIs)  You should be screened for sexually transmitted infections (STIs) including gonorrhea and chlamydia if: ? You are sexually active and are younger than 78 years of age. ? You are older than 78 years of age and your health care provider tells you that you are at risk for this type of infection. ? Your sexual activity has changed since you were last screened and you are at an increased risk for chlamydia or gonorrhea. Ask your health care provider if you are at risk.  If you do not have HIV, but are at risk, it may be recommended that you take  a prescription medicine daily to prevent HIV infection. This is called pre-exposure prophylaxis (PrEP). You are considered at risk if: ? You are sexually active and do not regularly use condoms or know the HIV status of your partner(s). ? You take drugs by injection. ? You are sexually active with a partner who has HIV. Talk with your health care provider about whether you are at high risk of being infected with HIV. If you choose to begin PrEP, you should first be tested for HIV. You should then be tested every   3 months for as long as you are taking PrEP. Pregnancy  If you are premenopausal and you may become pregnant, ask your health care provider about preconception counseling.  If you may become pregnant, take 400 to 800 micrograms (mcg) of folic acid every day.  If you want to prevent pregnancy, talk to your health care provider about birth control (contraception). Osteoporosis and menopause  Osteoporosis is a disease in which the bones lose minerals and strength with aging. This can result in serious bone fractures. Your risk for osteoporosis can be identified using a bone density scan.  If you are 65 years of age or older, or if you are at risk for osteoporosis and fractures, ask your health care provider if you should be screened.  Ask your health care provider whether you should take a calcium or vitamin D supplement to lower your risk for osteoporosis.  Menopause may have certain physical symptoms and risks.  Hormone replacement therapy may reduce some of these symptoms and risks. Talk to your health care provider about whether hormone replacement therapy is right for you. Follow these instructions at home:  Schedule regular health, dental, and eye exams.  Stay current with your immunizations.  Do not use any tobacco products including cigarettes, chewing tobacco, or electronic cigarettes.  If you are pregnant, do not drink alcohol.  If you are breastfeeding, limit how  much and how often you drink alcohol.  Limit alcohol intake to no more than 1 drink per day for nonpregnant women. One drink equals 12 ounces of beer, 5 ounces of wine, or 1 ounces of hard liquor.  Do not use street drugs.  Do not share needles.  Ask your health care provider for help if you need support or information about quitting drugs.  Tell your health care provider if you often feel depressed.  Tell your health care provider if you have ever been abused or do not feel safe at home. This information is not intended to replace advice given to you by your health care provider. Make sure you discuss any questions you have with your health care provider. Document Released: 09/07/2010 Document Revised: 07/31/2015 Document Reviewed: 11/26/2014 Elsevier Interactive Patient Education  2019 [employer]  

## 2018-04-03 NOTE — Assessment & Plan Note (Signed)
Would like referral for podiatry Referred for diabetic foot exam and to cut hypertrophic nails

## 2018-04-03 NOTE — Telephone Encounter (Signed)
Call her daughter - we had stopped this at her last visit.  If she needs it we can fill it but just wanted to make sure

## 2018-04-03 NOTE — Assessment & Plan Note (Signed)
Cough, occasionally productive for 1 month Occasional wheeze Postnasal drip, but otherwise no symptoms Given duration we will go ahead and prescribe an antibiotic-Z-Pak Continue Robitussin cough syrup over-the-counter as needed

## 2018-04-29 DIAGNOSIS — R5381 Other malaise: Secondary | ICD-10-CM | POA: Diagnosis not present

## 2018-04-29 DIAGNOSIS — N184 Chronic kidney disease, stage 4 (severe): Secondary | ICD-10-CM | POA: Diagnosis not present

## 2018-05-12 ENCOUNTER — Encounter (HOSPITAL_COMMUNITY): Payer: Medicare HMO

## 2018-05-12 ENCOUNTER — Encounter: Payer: Self-pay | Admitting: Vascular Surgery

## 2018-05-12 ENCOUNTER — Other Ambulatory Visit: Payer: Self-pay

## 2018-05-12 ENCOUNTER — Ambulatory Visit (INDEPENDENT_AMBULATORY_CARE_PROVIDER_SITE_OTHER)
Admission: RE | Admit: 2018-05-12 | Discharge: 2018-05-12 | Disposition: A | Discharge status: Home / Self Care | Payer: Medicare HMO | Source: Ambulatory Visit | Attending: Vascular Surgery | Admitting: Vascular Surgery

## 2018-05-12 ENCOUNTER — Ambulatory Visit (INDEPENDENT_AMBULATORY_CARE_PROVIDER_SITE_OTHER): Payer: Medicare HMO | Admitting: Vascular Surgery

## 2018-05-12 ENCOUNTER — Ambulatory Visit (HOSPITAL_COMMUNITY)
Admission: RE | Admit: 2018-05-12 | Discharge: 2018-05-12 | Disposition: A | Discharge status: Home / Self Care | Payer: Medicare HMO | Source: Ambulatory Visit | Attending: Vascular Surgery | Admitting: Vascular Surgery

## 2018-05-12 ENCOUNTER — Other Ambulatory Visit: Payer: Self-pay | Admitting: *Deleted

## 2018-05-12 ENCOUNTER — Encounter: Payer: Medicare HMO | Admitting: Vascular Surgery

## 2018-05-12 ENCOUNTER — Other Ambulatory Visit (HOSPITAL_COMMUNITY): Payer: Medicare HMO

## 2018-05-12 ENCOUNTER — Encounter: Payer: Self-pay | Admitting: *Deleted

## 2018-05-12 VITALS — BP 148/81 | HR 89 | Temp 97.3°F | Resp 16 | Ht 69.0 in | Wt 183.3 lb

## 2018-05-12 DIAGNOSIS — N184 Chronic kidney disease, stage 4 (severe): Secondary | ICD-10-CM | POA: Diagnosis not present

## 2018-05-12 NOTE — Progress Notes (Signed)
Patient ID: Erin Liu, female   DOB: 10-27-1940, 78 y.o.   MRN: [id number]  Reason for Consult: New Patient (Initial Visit) (eval for access)   Referred by Binnie Rail, MD  Subjective:     HPI:  Erin Liu is a 78 y.o. female here for consideration of dialysis access.  She has never been on dialysis.  She denies any chest breath upper extremity surgery.  She is right-hand dominant.  She does not take blood thinners.  This time she does not need an AV graft only consideration of fistula.  Past Medical History:  Diagnosis Date  . ANEMIA, NORMOCYTIC   . BREAST CYST, HX OF   . CARPAL TUNNEL SYNDROME, BILATERAL   . DIABETES MELLITUS, TYPE II   . Glaucoma   . HYPERLIPIDEMIA   . HYPERTENSION   . OBESITY   . PERIPHERAL NEUROPATHY   . RENAL FAILURE, CHRONIC    Family History  Problem Relation Age of Onset  . Arthritis Son   . Alcohol abuse Son   . Diabetes Son   . Hypertension Son   . Colon cancer Son 58  . Heart attack Mother        Died age 69s  . Heart attack Father        No details 57s  . Heart disease Sister        Slight heart attack, died age 67   Past Surgical History:  Procedure Laterality Date  . CATARACT EXTRACTION BILATERAL W/ ANTERIOR VITRECTOMY  05/2010  . COLONOSCOPY     [facility]/7-8 years    Short Social History:  Social History   Tobacco Use  . Smoking status: Never Smoker  . Smokeless tobacco: Never Used  Substance Use Topics  . Alcohol use: No    Allergies  Allergen Reactions  . Lisinopril     REACTION: cough    Current Outpatient Medications  Medication Sig Dispense Refill  . acetaminophen (TYLENOL) 325 MG tablet Take 2 tablets (650 mg total) by mouth every 6 (six) hours as needed for mild pain.    . Alcohol Swabs (B-D SINGLE USE SWABS REGULAR) PADS Use as directed. Dx E11.9 200 each 2  . allopurinol (ZYLOPRIM) 100 MG tablet TAKE 2 TABLETS BY MOUTH DAILY 60 tablet 5  . atorvastatin (LIPITOR) 40 MG tablet Take 1 tablet (40 mg  total) by mouth daily. 90 tablet 2  . Blood Glucose Calibration (TRUE METRIX LEVEL 1) Low SOLN Use as directed Dx. E11.9 3 each 0  . cyanocobalamin 2000 MCG tablet Take 1 tablet (2,000 mcg total) by mouth daily. 90 tablet 3  . glucose blood (TRUE METRIX BLOOD GLUCOSE TEST) test strip 1 each by Other route 2 (two) times daily. Use to check blood sugars twice a day. Dx E11.9 200 each 2  . latanoprost (XALATAN) 0.005 % ophthalmic solution Place 1 drop into both eyes at bedtime.  3  . TRUEPLUS LANCETS 33G MISC Use to help check blood sugars twice a day. Dx E11.9 200 each 2   No current facility-administered medications for this visit.     Review of Systems  Constitutional:  Constitutional negative. HENT: HENT negative.  Eyes: Eyes negative.  Respiratory: Respiratory negative.  Cardiovascular: Cardiovascular negative.  GI: Gastrointestinal negative.  Musculoskeletal: Musculoskeletal negative.  Skin: Skin negative.  Neurological: Neurological negative. Hematologic: Hematologic/lymphatic negative.  Psychiatric: Psychiatric negative.        Objective:  Objective   Vitals:   05/12/18 0849  BP: Marland Kitchen)  148/81  Pulse: 89  Resp: 16  Temp: (!) 97.3 F (36.3 C)  SpO2: 100%  Weight: 183 lb 5 oz (83.2 kg)  Height: 5\' 9"  (1.753 m)   Body mass index is 27.07 kg/m.  Physical Exam Eyes:     Pupils: Pupils are equal, round, and reactive to light.  Neck:     Musculoskeletal: Normal range of motion and neck supple.  Cardiovascular:     Rate and Rhythm: Normal rate.     Pulses:          Radial pulses are 2+ on the right side and 2+ on the left side.  Abdominal:     General: Abdomen is flat.  Musculoskeletal: Normal range of motion.        General: No swelling.  Skin:    General: Skin is warm and dry.  Neurological:     General: No focal deficit present.     Mental Status: She is alert.  Psychiatric:        Mood and Affect: Mood normal.        Behavior: Behavior normal.         Thought Content: Thought content normal.        Judgment: Judgment normal.     Data: I have independently interpreted her bilateral upper extremity vein mapping which demonstrates suitable right sided basilic and cephalic veins and likely suitable left-sided cephalic vein measuring 6.04 cm at the antecubital fossa  I have also independently interpreted her bilateral upper extremity arterial duplexes demonstrates triphasic waveforms in her bilateral brachial arteries measuring left 0.33 cm and right 0.37 cm     Assessment/Plan:     78 year old female in need of dialysis access.  This time we can wait on AV graft appears to have suitable left arm vein.  We will plan for left upper extremity basilic or cephalic vein fistula in the near future.  I discussed risk benefits alternatives she agrees to proceed     Waynetta Sandy MD [facility]

## 2018-05-15 ENCOUNTER — Encounter: Payer: Self-pay | Admitting: Podiatry

## 2018-05-15 ENCOUNTER — Ambulatory Visit: Payer: Medicare HMO | Admitting: Podiatry

## 2018-05-15 VITALS — BP 122/67 | HR 85

## 2018-05-15 DIAGNOSIS — B351 Tinea unguium: Secondary | ICD-10-CM | POA: Diagnosis not present

## 2018-05-15 DIAGNOSIS — M79675 Pain in left toe(s): Secondary | ICD-10-CM

## 2018-05-15 DIAGNOSIS — M79674 Pain in right toe(s): Secondary | ICD-10-CM | POA: Diagnosis not present

## 2018-05-15 DIAGNOSIS — E1142 Type 2 diabetes mellitus with diabetic polyneuropathy: Secondary | ICD-10-CM

## 2018-05-15 NOTE — Patient Instructions (Signed)
Diabetes Mellitus and Foot Care Foot care is an important part of your health, especially when you have diabetes. Diabetes may cause you to have problems because of poor blood flow (circulation) to your feet and legs, which can cause your skin to:  Become thinner and drier.  Break more easily.  Heal more slowly.  Peel and crack. You may also have nerve damage (neuropathy) in your legs and feet, causing decreased feeling in them. This means that you may not notice minor injuries to your feet that could lead to more serious problems. Noticing and addressing any potential problems early is the best way to prevent future foot problems. How to care for your feet Foot hygiene  Wash your feet daily with warm water and mild soap. Do not use hot water. Then, pat your feet and the areas between your toes until they are completely dry. Do not soak your feet as this can dry your skin.  Trim your toenails straight across. Do not dig under them or around the cuticle. File the edges of your nails with an emery board or nail file.  Apply a moisturizing lotion or petroleum jelly to the skin on your feet and to dry, brittle toenails. Use lotion that does not contain alcohol and is unscented. Do not apply lotion between your toes. Shoes and socks  Wear clean socks or stockings every day. Make sure they are not too tight. Do not wear knee-high stockings since they may decrease blood flow to your legs.  Wear shoes that fit properly and have enough cushioning. Always look in your shoes before you put them on to be sure there are no objects inside.  To break in new shoes, wear them for just a few hours a day. This prevents injuries on your feet. Wounds, scrapes, corns, and calluses  Check your feet daily for blisters, cuts, bruises, sores, and redness. If you cannot see the bottom of your feet, use a mirror or ask someone for help.  Do not cut corns or calluses or try to remove them with medicine.  If you  find a minor scrape, cut, or break in the skin on your feet, keep it and the skin around it clean and dry. You may clean these areas with mild soap and water. Do not clean the area with peroxide, alcohol, or iodine.  If you have a wound, scrape, corn, or callus on your foot, look at it several times a day to make sure it is healing and not infected. Check for: ? Redness, swelling, or pain. ? Fluid or blood. ? Warmth. ? Pus or a bad smell. General instructions  Do not cross your legs. This may decrease blood flow to your feet.  Do not use heating pads or hot water bottles on your feet. They may burn your skin. If you have lost feeling in your feet or legs, you may not know this is happening until it is too late.  Protect your feet from hot and cold by wearing shoes, such as at the beach or on hot pavement.  Schedule a complete foot exam at least once a year (annually) or more often if you have foot problems. If you have foot problems, report any cuts, sores, or bruises to your health care provider immediately. Contact a health care provider if:  You have a medical condition that increases your risk of infection and you have any cuts, sores, or bruises on your feet.  You have an injury that is not   healing.  You have redness on your legs or feet.  You feel burning or tingling in your legs or feet.  You have pain or cramps in your legs and feet.  Your legs or feet are numb.  Your feet always feel cold.  You have pain around a toenail. Get help right away if:  You have a wound, scrape, corn, or callus on your foot and: ? You have pain, swelling, or redness that gets worse. ? You have fluid or blood coming from the wound, scrape, corn, or callus. ? Your wound, scrape, corn, or callus feels warm to the touch. ? You have pus or a bad smell coming from the wound, scrape, corn, or callus. ? You have a fever. ? You have a red line going up your leg. Summary  Check your feet every day  for cuts, sores, red spots, swelling, and blisters.  Moisturize feet and legs daily.  Wear shoes that fit properly and have enough cushioning.  If you have foot problems, report any cuts, sores, or bruises to your health care provider immediately.  Schedule a complete foot exam at least once a year (annually) or more often if you have foot problems. This information is not intended to replace advice given to you by your health care provider. Make sure you discuss any questions you have with your health care provider. Document Released: 02/20/2000 Document Revised: 04/06/2017 Document Reviewed: 03/26/2016 Elsevier Interactive Patient Education  2019 [employer]  Onychomycosis/Fungal Toenails  WHAT IS IT? An infection that lies within the keratin of your nail plate that is caused by a fungus.  WHY ME? Fungal infections affect all ages, sexes, races, and creeds.  There may be many factors that predispose you to a fungal infection such as age, coexisting medical conditions such as diabetes, or an autoimmune disease; stress, medications, fatigue, genetics, etc.  Bottom line: fungus thrives in a warm, moist environment and your shoes offer such a location.  IS IT CONTAGIOUS? Theoretically, yes.  You do not want to share shoes, nail clippers or files with someone who has fungal toenails.  Walking around barefoot in the same room or sleeping in the same bed is unlikely to transfer the organism.  It is important to realize, however, that fungus can spread easily from one nail to the next on the same foot.  HOW DO WE TREAT THIS?  There are several ways to treat this condition.  Treatment may depend on many factors such as age, medications, pregnancy, liver and kidney conditions, etc.  It is best to ask your doctor which options are available to you.  1. No treatment.   Unlike many other medical concerns, you can live with this condition.  However for many people this can be a painful condition and  may lead to ingrown toenails or a bacterial infection.  It is recommended that you keep the nails cut short to help reduce the amount of fungal nail. 2. Topical treatment.  These range from herbal remedies to prescription strength nail lacquers.  About 40-50% effective, topicals require twice daily application for approximately 9 to 12 months or until an entirely new nail has grown out.  The most effective topicals are medical grade medications available through physicians offices. 3. Oral antifungal medications.  With an 80-90% cure rate, the most common oral medication requires 3 to 4 months of therapy and stays in your system for a year as the new nail grows out.  Oral antifungal medications do require   blood work to make sure it is a safe drug for you.  A liver function panel will be performed prior to starting the medication and after the first month of treatment.  It is important to have the blood work performed to avoid any harmful side effects.  In general, this medication safe but blood work is required. 4. Laser Therapy.  This treatment is performed by applying a specialized laser to the affected nail plate.  This therapy is noninvasive, fast, and non-painful.  It is not covered by insurance and is therefore, out of pocket.  The results have been very good with a 80-95% cure rate.  The [facility] is the only practice in the area to offer this therapy. 5. Permanent Nail Avulsion.  Removing the entire nail so that a new nail will not grow back. 

## 2018-05-15 NOTE — Progress Notes (Signed)
Subjective: Erin Liu presents today referred by Binnie Rail, MD with diabetes and cc of painful, discolored, thick toenails which interfere with daily activities.  Pain is aggravated when wearing enclosed shoe gear.   Past Medical History:  Diagnosis Date  . ANEMIA, NORMOCYTIC   . BREAST CYST, HX OF   . CARPAL TUNNEL SYNDROME, BILATERAL   . DIABETES MELLITUS, TYPE II   . Glaucoma   . HYPERLIPIDEMIA   . HYPERTENSION   . OBESITY   . PERIPHERAL NEUROPATHY   . RENAL FAILURE, CHRONIC     Patient Active Problem List   Diagnosis Date Noted  . Physical deconditioning 01/13/2018  . Hypokalemia 01/12/2018  . Tremor of both hands 10/10/2017  . Urinary incontinence 10/10/2017  . Vasovagal syncope 10/02/2017  . 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 B12 deficiency 04/11/2017  . Left flank pain 08/30/2015  . Cough 08/26/2015  . Lumbago 02/25/2015  . Essential tremor   . Glaucoma 01/08/2010  . Chronic kidney disease (CKD), stage IV (severe) ([place]) 12/10/2008  . URI (upper respiratory infection) 06/28/2006  . DM (diabetes mellitus), secondary, uncontrolled, with renal complications ([place]) 97/98/9211  . Diabetic neuropathy ([place]) 02/22/2006  . Dyslipidemia 02/22/2006  . 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 CARPAL TUNNEL SYNDROME, BILATERAL 02/22/2006  . Essential hypertension 02/22/2006  . CONSTIPATION 02/22/2006    Past Surgical History:  Procedure Laterality Date  . CATARACT EXTRACTION BILATERAL W/ ANTERIOR VITRECTOMY  05/2010  . COLONOSCOPY     [facility]/7-8 years     Current Outpatient Medications:  .  acetaminophen (TYLENOL) 325 MG tablet, Take 2 tablets (650 mg total) by mouth every 6 (six) hours as needed for mild pain., Disp: , Rfl:  .  Alcohol Swabs (B-D SINGLE USE SWABS REGULAR) PADS, Use as directed. Dx E11.9, Disp: 200 each, Rfl: 2 .  allopurinol (ZYLOPRIM) 100 MG tablet, TAKE 2 TABLETS BY MOUTH DAILY, Disp: 60 tablet, Rfl: 5 .  atorvastatin (LIPITOR)  40 MG tablet, Take 1 tablet (40 mg total) by mouth daily., Disp: 90 tablet, Rfl: 2 .  Blood Glucose Calibration (TRUE METRIX LEVEL 1) Low SOLN, Use as directed Dx. E11.9, Disp: 3 each, Rfl: 0 .  Cyanocobalamin (VITAMIN B-12) 2000 MCG TBCR, Take 1 tablet by mouth daily., Disp: , Rfl:  .  cyanocobalamin 2000 MCG tablet, Take 1 tablet (2,000 mcg total) by mouth daily., Disp: 90 tablet, Rfl: 3 .  glucose blood (TRUE METRIX BLOOD GLUCOSE TEST) test strip, 1 each by Other route 2 (two) times daily. Use to check blood sugars twice a day. Dx E11.9, Disp: 200 each, Rfl: 2 .  latanoprost (XALATAN) 0.005 % ophthalmic solution, Place 1 drop into both eyes at bedtime., Disp: , Rfl: 3 .  TRUEPLUS LANCETS 33G MISC, Use to help check blood sugars twice a day. Dx E11.9, Disp: 200 each, Rfl: 2  Allergies  Allergen Reactions  . Lisinopril     REACTION: cough    Social History   Occupational History  . Not on file  Tobacco Use  . Smoking status: Never Smoker  . Smokeless tobacco: Never Used  Substance and Sexual Activity  . Alcohol use: No  . Drug use: No  . Sexual activity: Not Currently    Family History  Problem Relation Age of Onset  . Arthritis Son   . Alcohol abuse Son   . Diabetes Son   . Hypertension Son   . Colon cancer Son 42  . Heart  attack Mother        Died age 30s  . Heart attack Father        No details 60s  . Heart disease Sister        Slight heart attack, died age 63    Immunization History  Administered Date(s) Administered  . Influenza Split 01/12/2011  . Influenza Whole 01/17/2007, 12/10/2008, 01/08/2010  . Influenza, High Dose Seasonal PF 12/11/2016, 12/19/2017  . Influenza,inj,Quad PF,6+ Mos 11/10/2012  . Influenza-Unspecified 12/07/2011, 12/22/2013, 01/08/2015  . Pneumococcal Conjugate-13 09/25/2013  . Pneumococcal Polysaccharide-23 09/11/2010  . Tdap 09/11/2010     Review of systems: Positive Findings in bold print.  Constitutional:  chills, fatigue,  fever, sweats, weight change Communication: Optometrist, sign Ecologist, hand writing, iPad/Android device Head: headaches, head injury Eyes: changes in vision, eye pain, glaucoma, cataracts, macular degeneration, diplopia, glare,  light sensitivity, eyeglasses or contacts, blindness Ears nose mouth throat: Hard of hearing, ringing in ears, deaf, sign language,  vertigo,   nosebleeds,  rhinitis,  cold sores, snoring, swollen glands Cardiovascular: HTN, edema, arrhythmia, pacemaker in place, defibrillator in place,  chest pain/tightness, chronic anticoagulation, blood clot, heart failure Peripheral Vascular: leg cramps, varicose veins, blood clots, lymphedema Respiratory:  difficulty breathing, denies congestion, SOB, wheezing, cough, emphysema Gastrointestinal: change in appetite or weight, abdominal pain, constipation, diarrhea, nausea, vomiting, vomiting blood, change in bowel habits, abdominal pain, jaundice, rectal bleeding, hemorrhoids, Genitourinary:  nocturia,  pain on urination,  blood in urine, Foley catheter, urinary urgency Musculoskeletal: uses mobility aid,  cramping, stiff joints, painful joints, decreased joint motion, fractures, OA, gout Skin: +changes in toenails, color change, dryness, itching, mole changes,  rash  Neurological: headaches, numbness in feet, paresthesias in feet, burning in feet, fainting,  seizures, change in speech. denies headaches, memory problems/poor historian, cerebral palsy, weakness, paralysis Endocrine: diabetes, hypothyroidism, hyperthyroidism,  goiter, dry mouth, flushing, heat intolerance,  cold intolerance,  excessive thirst, denies polyuria,  nocturia Hematological:  easy bleeding, excessive bleeding, easy bruising, enlarged lymph nodes, on long term blood thinner, history of past transusions Allergy/immunological:  hives, eczema, frequent infections, multiple drug allergies, seasonal allergies, transplant recipient Psychiatric:  anxiety,  depression, mood disorder, suicidal ideations, hallucinations   Objective: Vascular Examination: Capillary refill time immediate x 10 digits.  Dorsalis pedis and posterior tibial pulses present b/l.  No digital hair x 10 digits.  Skin temperature gradient WNL b/l.  Dermatological Examination: Skin with normal turgor, texture and tone b/l.  Toenails 1-5 b/l discolored, thick, dystrophic with subungual debris and pain with palpation to nailbeds due to thickness of nails.  Musculoskeletal: Muscle strength 5/5 to all LE muscle groups   Neurological: Sensation intact with 10 gram monofilament b/l.  Vibratory sensation diminished b/l.  Assessment: 1. Painful onychomycosis toenails 1-5 b/l  2. NIDDM with neuropathy  Plan: 1. Discussed diabetic foot care principles. Literature dispensed on today. 2. Toenails 1-5 b/l were debrided in length and girth without iatrogenic bleeding. 3. Patient to continue soft, supportive shoe gear. 4. Patient to report any pedal injuries to medical professional immediately. 5. Follow up 3 months.  6. Patient/POA to call should there be a concern in the interim.

## 2018-05-16 ENCOUNTER — Encounter: Payer: Self-pay | Admitting: Internal Medicine

## 2018-05-19 NOTE — Progress Notes (Signed)
Spoke to patients daughter on the phone to complete pre-op call. Daughter is currently at work and would like [place] to call her back at 0830 on Monday.

## 2018-05-22 ENCOUNTER — Other Ambulatory Visit: Payer: Self-pay

## 2018-05-22 ENCOUNTER — Encounter (HOSPITAL_COMMUNITY): Payer: Self-pay | Admitting: *Deleted

## 2018-05-22 NOTE — Progress Notes (Signed)
Spoke with pt's daughter, [name] for pre-op call. She states pt does not have a cardiac history. She is treated for HTN. Pt is a type 2 diabetic. No longer on meds per daughter. Last A1C was 6.5 on 01/10/18. [name] states pt doesn't check her blood sugar often and hasn't done it in a while. She does have a meter. I did instruct her to have pt check her blood sugar in the AM when she gets up. If blood sugar is 70 or below, treat with 1/2 cup of clear juice (apple or cranberry) and recheck blood sugar 15 minutes after drinking juice. [name] voiced understanding.

## 2018-05-22 NOTE — Anesthesia Preprocedure Evaluation (Addendum)
Anesthesia Evaluation  Patient identified by MRN, date of birth, ID band Patient awake    Reviewed: Allergy & Precautions, NPO status , Patient's Chart, lab work & pertinent test results  History of Anesthesia Complications Negative for: history of anesthetic complications  Airway Mallampati: II  TM Distance: >3 FB Neck ROM: Full    Dental  (+) Dental Advisory Given, Poor Dentition, Loose, Missing, Chipped   Pulmonary neg pulmonary ROS,    breath sounds clear to auscultation       Cardiovascular hypertension (not on medications),  Rhythm:Regular Rate:Normal  '19 ECHO: EF 50-55%, mild MR, mild TR   Neuro/Psych negative neurological ROS     GI/Hepatic negative GI ROS, Neg liver ROS,   Endo/Other  diabetes (glu 95)Morbid obesity  Renal/GU ESRFRenal disease (not on dialysis yet)     Musculoskeletal   Abdominal (+) + obese,   Peds  Hematology  (+) Blood dyscrasia (Hb 10.2), anemia ,   Anesthesia Other Findings   Reproductive/Obstetrics                            Anesthesia Physical Anesthesia Plan  ASA: III  Anesthesia Plan: MAC   Post-op Pain Management:    Induction:   PONV Risk Score and Plan: 2 and Treatment may vary due to age or medical condition and Ondansetron  Airway Management Planned: Natural Airway and Simple Face Mask  Additional Equipment:   Intra-op Plan:   Post-operative Plan:   Informed Consent: I have reviewed the patients History and Physical, chart, labs and discussed the procedure including the risks, benefits and alternatives for the proposed anesthesia with the patient or authorized representative who has indicated his/her understanding and acceptance.     Dental advisory given  Plan Discussed with: CRNA and Surgeon  Anesthesia Plan Comments: (Plan routine monitors, MAC)       Anesthesia Quick Evaluation

## 2018-05-23 ENCOUNTER — Ambulatory Visit (HOSPITAL_COMMUNITY): Payer: Medicare HMO | Admitting: Anesthesiology

## 2018-05-23 ENCOUNTER — Ambulatory Visit (HOSPITAL_COMMUNITY)
Admission: RE | Admit: 2018-05-23 | Discharge: 2018-05-23 | Disposition: A | Discharge status: Home / Self Care | Payer: Medicare HMO | Source: Ambulatory Visit | Attending: Vascular Surgery | Admitting: Vascular Surgery

## 2018-05-23 ENCOUNTER — Other Ambulatory Visit: Payer: Self-pay

## 2018-05-23 ENCOUNTER — Telehealth: Payer: Self-pay | Admitting: Vascular Surgery

## 2018-05-23 ENCOUNTER — Encounter (HOSPITAL_COMMUNITY)
Admission: RE | Disposition: A | Discharge status: Home / Self Care | Payer: Self-pay | Source: Ambulatory Visit | Attending: Vascular Surgery

## 2018-05-23 DIAGNOSIS — N184 Chronic kidney disease, stage 4 (severe): Secondary | ICD-10-CM | POA: Insufficient documentation

## 2018-05-23 DIAGNOSIS — Z79899 Other long term (current) drug therapy: Secondary | ICD-10-CM | POA: Diagnosis not present

## 2018-05-23 DIAGNOSIS — M10371 Gout due to renal impairment, right ankle and foot: Secondary | ICD-10-CM | POA: Diagnosis not present

## 2018-05-23 DIAGNOSIS — E1142 Type 2 diabetes mellitus with diabetic polyneuropathy: Secondary | ICD-10-CM | POA: Insufficient documentation

## 2018-05-23 DIAGNOSIS — Z6827 Body mass index (BMI) 27.0-27.9, adult: Secondary | ICD-10-CM | POA: Diagnosis not present

## 2018-05-23 DIAGNOSIS — E1122 Type 2 diabetes mellitus with diabetic chronic kidney disease: Secondary | ICD-10-CM | POA: Diagnosis not present

## 2018-05-23 DIAGNOSIS — H409 Unspecified glaucoma: Secondary | ICD-10-CM | POA: Insufficient documentation

## 2018-05-23 DIAGNOSIS — I129 Hypertensive chronic kidney disease with stage 1 through stage 4 chronic kidney disease, or unspecified chronic kidney disease: Secondary | ICD-10-CM | POA: Diagnosis not present

## 2018-05-23 DIAGNOSIS — E785 Hyperlipidemia, unspecified: Secondary | ICD-10-CM | POA: Diagnosis not present

## 2018-05-23 DIAGNOSIS — N185 Chronic kidney disease, stage 5: Secondary | ICD-10-CM | POA: Diagnosis not present

## 2018-05-23 HISTORY — PX: AV FISTULA PLACEMENT: SHX1204

## 2018-05-23 HISTORY — DX: Unspecified osteoarthritis, unspecified site: M19.90

## 2018-05-23 LAB — POCT I-STAT 4, (NA,K, GLUC, HGB,HCT)
Glucose, Bld: 95 mg/dL (ref 70–99)
HCT: 30 % — ABNORMAL LOW (ref 36.0–46.0)
Hemoglobin: 10.2 g/dL — ABNORMAL LOW (ref 12.0–15.0)
Potassium: 5 mmol/L (ref 3.5–5.1)
Sodium: 138 mmol/L (ref 135–145)

## 2018-05-23 LAB — GLUCOSE, CAPILLARY: Glucose-Capillary: 77 mg/dL (ref 70–99)

## 2018-05-23 SURGERY — ARTERIOVENOUS (AV) FISTULA CREATION
Anesthesia: Monitor Anesthesia Care | Laterality: Left

## 2018-05-23 MED ORDER — ONDANSETRON HCL 4 MG/2ML IJ SOLN
INTRAMUSCULAR | Status: AC
  Filled 2018-05-23: qty 2

## 2018-05-23 MED ORDER — PHENYLEPHRINE 40 MCG/ML (10ML) SYRINGE FOR IV PUSH (FOR BLOOD PRESSURE SUPPORT)
PREFILLED_SYRINGE | INTRAVENOUS | Status: DC | PRN
  Administered 2018-05-23 (×4): 40 ug via INTRAVENOUS
  Administered 2018-05-23: 80 ug via INTRAVENOUS
  Administered 2018-05-23: 40 ug via INTRAVENOUS
  Administered 2018-05-23 (×2): 80 ug via INTRAVENOUS
  Administered 2018-05-23: 40 ug via INTRAVENOUS
  Administered 2018-05-23: 80 ug via INTRAVENOUS

## 2018-05-23 MED ORDER — CEFAZOLIN SODIUM-DEXTROSE 2-4 GM/100ML-% IV SOLN
2.0000 g | INTRAVENOUS | Status: AC
  Administered 2018-05-23: 2 g via INTRAVENOUS
  Filled 2018-05-23: qty 100

## 2018-05-23 MED ORDER — PROMETHAZINE HCL 25 MG/ML IJ SOLN: 6.2500 mg | INTRAMUSCULAR | Status: DC | PRN

## 2018-05-23 MED ORDER — FENTANYL CITRATE (PF) 100 MCG/2ML IJ SOLN
INTRAMUSCULAR | Status: DC | PRN
  Administered 2018-05-23: 25 ug via INTRAVENOUS

## 2018-05-23 MED ORDER — FENTANYL CITRATE (PF) 100 MCG/2ML IJ SOLN: 25.0000 ug | INTRAMUSCULAR | Status: DC | PRN

## 2018-05-23 MED ORDER — PROPOFOL 500 MG/50ML IV EMUL
INTRAVENOUS | Status: DC | PRN
  Administered 2018-05-23: 100 ug/kg/min via INTRAVENOUS

## 2018-05-23 MED ORDER — ONDANSETRON HCL 4 MG/2ML IJ SOLN
INTRAMUSCULAR | Status: DC | PRN
  Administered 2018-05-23: 4 mg via INTRAVENOUS

## 2018-05-23 MED ORDER — 0.9 % SODIUM CHLORIDE (POUR BTL) OPTIME
TOPICAL | Status: DC | PRN
  Administered 2018-05-23: 1000 mL

## 2018-05-23 MED ORDER — MIDAZOLAM HCL 2 MG/2ML IJ SOLN: 0.5000 mg | Freq: Once | INTRAMUSCULAR | Status: DC | PRN

## 2018-05-23 MED ORDER — MEPERIDINE HCL 50 MG/ML IJ SOLN: 6.2500 mg | INTRAMUSCULAR | Status: DC | PRN

## 2018-05-23 MED ORDER — LIDOCAINE HCL (PF) 1 % IJ SOLN
INTRAMUSCULAR | Status: AC
  Filled 2018-05-23: qty 30

## 2018-05-23 MED ORDER — SODIUM CHLORIDE 0.9 % IV SOLN
INTRAVENOUS | Status: AC
  Filled 2018-05-23: qty 1.2

## 2018-05-23 MED ORDER — SODIUM CHLORIDE 0.9 % IV SOLN
INTRAVENOUS | Status: DC | PRN
  Administered 2018-05-23: 08:00:00

## 2018-05-23 MED ORDER — FENTANYL CITRATE (PF) 250 MCG/5ML IJ SOLN
INTRAMUSCULAR | Status: AC
  Filled 2018-05-23: qty 5

## 2018-05-23 MED ORDER — DEXAMETHASONE SODIUM PHOSPHATE 10 MG/ML IJ SOLN
INTRAMUSCULAR | Status: AC
  Filled 2018-05-23: qty 1

## 2018-05-23 MED ORDER — PHENYLEPHRINE 40 MCG/ML (10ML) SYRINGE FOR IV PUSH (FOR BLOOD PRESSURE SUPPORT)
PREFILLED_SYRINGE | INTRAVENOUS | Status: AC
  Filled 2018-05-23: qty 10

## 2018-05-23 MED ORDER — LIDOCAINE 2% (20 MG/ML) 5 ML SYRINGE
INTRAMUSCULAR | Status: AC
  Filled 2018-05-23: qty 5

## 2018-05-23 MED ORDER — ROCURONIUM BROMIDE 50 MG/5ML IV SOSY
PREFILLED_SYRINGE | INTRAVENOUS | Status: AC
  Filled 2018-05-23: qty 5

## 2018-05-23 MED ORDER — LIDOCAINE 2% (20 MG/ML) 5 ML SYRINGE
INTRAMUSCULAR | Status: DC | PRN
  Administered 2018-05-23: 40 mg via INTRAVENOUS

## 2018-05-23 MED ORDER — SODIUM CHLORIDE 0.9 % IV SOLN
INTRAVENOUS | Status: DC
  Administered 2018-05-23: 07:00:00 via INTRAVENOUS

## 2018-05-23 MED ORDER — PROPOFOL 10 MG/ML IV BOLUS
INTRAVENOUS | Status: DC | PRN
  Administered 2018-05-23: 20 mg via INTRAVENOUS
  Administered 2018-05-23: 10 mg via INTRAVENOUS

## 2018-05-23 MED ORDER — GLYCOPYRROLATE PF 0.2 MG/ML IJ SOSY
PREFILLED_SYRINGE | INTRAMUSCULAR | Status: DC | PRN
  Administered 2018-05-23: .2 mg via INTRAVENOUS

## 2018-05-23 MED ORDER — LIDOCAINE HCL 1 % IJ SOLN
INTRAMUSCULAR | Status: DC | PRN
  Administered 2018-05-23: 30 mL

## 2018-05-23 MED ORDER — OXYCODONE-ACETAMINOPHEN 5-325 MG PO TABS: 1.0000 | ORAL_TABLET | Freq: Four times a day (QID) | ORAL | 0 refills | Status: DC | PRN

## 2018-05-23 MED ORDER — PROPOFOL 10 MG/ML IV BOLUS
INTRAVENOUS | Status: AC
  Filled 2018-05-23: qty 20

## 2018-05-23 MED ORDER — GLYCOPYRROLATE PF 0.2 MG/ML IJ SOSY
PREFILLED_SYRINGE | INTRAMUSCULAR | Status: AC
  Filled 2018-05-23: qty 1

## 2018-05-23 SURGICAL SUPPLY — 32 items
ADH SKN CLS APL DERMABOND .7 (GAUZE/BANDAGES/DRESSINGS) ×1
ARMBAND PINK RESTRICT EXTREMIT (MISCELLANEOUS) ×3 IMPLANT
CANISTER SUCT 3000ML PPV (MISCELLANEOUS) ×3 IMPLANT
CLIP VESOCCLUDE MED 6/CT (CLIP) ×3 IMPLANT
CLIP VESOCCLUDE SM WIDE 6/CT (CLIP) ×3 IMPLANT
COVER PROBE W GEL 5X96 (DRAPES) ×2 IMPLANT
COVER WAND RF STERILE (DRAPES) ×1 IMPLANT
DERMABOND ADVANCED (GAUZE/BANDAGES/DRESSINGS) ×2
DERMABOND ADVANCED .7 DNX12 (GAUZE/BANDAGES/DRESSINGS) ×1 IMPLANT
ELECT REM PT RETURN 9FT ADLT (ELECTROSURGICAL) ×3
ELECTRODE REM PT RTRN 9FT ADLT (ELECTROSURGICAL) ×1 IMPLANT
GLOVE BIO SURGEON STRL SZ7.5 (GLOVE) ×3 IMPLANT
GLOVE BIOGEL PI IND STRL 6.5 (GLOVE) IMPLANT
GLOVE BIOGEL PI INDICATOR 6.5 (GLOVE) ×4
GLOVE ECLIPSE 7.0 STRL STRAW (GLOVE) ×2 IMPLANT
GOWN STRL REUS W/ TWL LRG LVL3 (GOWN DISPOSABLE) ×2 IMPLANT
GOWN STRL REUS W/ TWL XL LVL3 (GOWN DISPOSABLE) ×1 IMPLANT
GOWN STRL REUS W/TWL LRG LVL3 (GOWN DISPOSABLE) ×6
GOWN STRL REUS W/TWL XL LVL3 (GOWN DISPOSABLE) ×3
INSERT FOGARTY SM (MISCELLANEOUS) IMPLANT
KIT BASIN OR (CUSTOM PROCEDURE TRAY) ×3 IMPLANT
KIT TURNOVER KIT B (KITS) ×3 IMPLANT
NS IRRIG 1000ML POUR BTL (IV SOLUTION) ×3 IMPLANT
PACK CV ACCESS (CUSTOM PROCEDURE TRAY) ×3 IMPLANT
PAD ARMBOARD 7.5X6 YLW CONV (MISCELLANEOUS) ×6 IMPLANT
SUT MNCRL AB 4-0 PS2 18 (SUTURE) ×3 IMPLANT
SUT PROLENE 6 0 BV (SUTURE) ×3 IMPLANT
SUT VIC AB 3-0 SH 27 (SUTURE) ×3
SUT VIC AB 3-0 SH 27X BRD (SUTURE) ×1 IMPLANT
TOWEL GREEN STERILE (TOWEL DISPOSABLE) ×3 IMPLANT
UNDERPAD 30X30 (UNDERPADS AND DIAPERS) ×3 IMPLANT
WATER STERILE IRR 1000ML POUR (IV SOLUTION) ×3 IMPLANT

## 2018-05-23 NOTE — Op Note (Signed)
    Patient name: Erin Liu MRN: [id number] DOB: 1941/01/15 Sex: female  05/23/2018 Pre-operative Diagnosis: Chronic kidney disease Post-operative diagnosis:  Same Surgeon:  Eda Paschal. Donzetta Matters, MD Assistant: Laurence Slate, PA Procedure Performed: Left for stage brachiobasilic AV fistula creation  Indications: 78 year old female with history of chronic kidney disease.  She has suitable vein in the left arm for fistula creation and is right-hand dominant.  She is indicated for AV fistula.  Findings: Cephalic vein was large above the antecubitum put into the antecubitum branched and was approximately 2-1/2 mm only.  Basilic vein at the antecubital measured 5 mm external diameter was free of disease.  At completion there was a strong thrill traced with Doppler there is also palpable radial pulse confirmed with Doppler.  This fistula does not work she could have a left arm cephalic vein fistula above the antecubitum.   Procedure:  The patient was identified in the holding area and taken to the operating room where she was placed supine operative table and MAC anesthesia induced.  She was sterilely prepped draped in left upper extremity in the usual fashion, antibiotics were administered and timeout was called.  Ultrasound was used to identify what was ultimately the basilic vein which was large the antecubitum the cephalic vein had branched and was much smaller there.  The area was anesthetized 1% lidocaine with epinephrine.  A transverse incision was made we dissected out the vein divided branched and clips and ties marked the vein for orientation.  Deep fascia was divided and the artery was identified.  There was a strong pulse in it however did have some disease vessel loop was placed around it.  The vein was then transected distally dilated flushed with heparinized saline and clamped.  The artery was then clamped distally proximally opened longitudinally.  There was some area of atherosclerosis that  was trimmed away.  Was flushed with heparinized saline both directions.  The vein was then sewn end-to-side with 6-0 Prolene suture.  Prior to completion anastomosis we allowed flushing all directions.  Upon completion releasing the clamps there was a thrill proximally in the fistula.  We could trace this for several centimeters and then use Doppler to trace it up the arm.  There was a palpable radial pulse confirmed with Doppler.  We irrigated the wound to hemostasis closed in layers Vicryl and Monocryl.  She tolerated this procedure well without immediate complication.  All counts were correct at completion.  EBL: 10 cc  Brandon C. Donzetta Matters, MD [facility] Office: [phone] Pager: [phone]

## 2018-05-23 NOTE — H&P (Signed)
   History and Physical Update  The patient was interviewed and re-examined.  The patient's previous History and Physical has been reviewed and is unchanged from recent office visit. Plan for left arm avf.  Erin Liu C. Donzetta Matters, MD [facility] [place] Office: [phone] Pager: [phone]   05/23/2018, 7:28 AM

## 2018-05-23 NOTE — Telephone Encounter (Signed)
sch appt lvm mld tr 07/07/2018 1pm Dialysis duplex 145pm p/o NP

## 2018-05-23 NOTE — Transfer of Care (Signed)
Immediate Anesthesia Transfer of Care Note  Patient: Erin Liu  Procedure(s) Performed: ARTERIOVENOUS (AV) FISTULA CREATION LEFT ARM (Left )  Patient Location: PACU  Anesthesia Type:MAC  Level of Consciousness: awake, alert  and patient cooperative  Airway & Oxygen Therapy: Patient Spontanous Breathing and Patient connected to face mask oxygen  Post-op Assessment: Report given to RN and Post -op Vital signs reviewed and stable  Post vital signs: Reviewed and stable  Last Vitals:  Vitals Value Taken Time  BP 142/63 05/23/2018  8:36 AM  Temp    Pulse 96 05/23/2018  8:37 AM  Resp 17 05/23/2018  8:37 AM  SpO2 100 % 05/23/2018  8:37 AM  Vitals shown include unvalidated device data.  Last Pain:  Vitals:   05/23/18 0606  TempSrc: Oral         Complications: No apparent anesthesia complications

## 2018-05-23 NOTE — Discharge Instructions (Signed)
° °  [facility] ° °Discharge Instructions ° °AV Fistula or Graft Surgery for Dialysis Access ° °Please refer to the following instructions for your post-procedure care. Your surgeon or physician assistant will discuss any changes with you. ° °Activity ° °You may drive the day following your surgery, if you are comfortable and no longer taking prescription pain medication. Resume full activity as the soreness in your incision resolves. ° °Bathing/Showering ° °You may shower after you go home. Keep your incision dry for 48 hours. Do not soak in a bathtub, hot tub, or swim until the incision heals completely. You may not shower if you have a hemodialysis catheter. ° °Incision Care ° °Clean your incision with mild soap and water after 48 hours. Pat the area dry with a clean towel. You do not need a bandage unless otherwise instructed. Do not apply any ointments or creams to your incision. You may have skin glue on your incision. Do not peel it off. It will come off on its own in about one week. Your arm may swell a bit after surgery. To reduce swelling use pillows to elevate your arm so it is above your heart. Your doctor will tell you if you need to lightly wrap your arm with an ACE bandage. ° °Diet ° °Resume your normal diet. There are not special food restrictions following this procedure. In order to heal from your surgery, it is CRITICAL to get adequate nutrition. Your body requires vitamins, minerals, and protein. Vegetables are the best source of vitamins and minerals. Vegetables also provide the perfect balance of protein. Processed food has little nutritional value, so try to avoid this. ° °Medications ° °Resume taking all of your medications. If your incision is causing pain, you may take over-the counter pain relievers such as acetaminophen (Tylenol). If you were prescribed a stronger pain medication, please be aware these medications can cause nausea and constipation. Prevent  nausea by taking the medication with a snack or meal. Avoid constipation by drinking plenty of fluids and eating foods with high amount of fiber, such as fruits, vegetables, and grains. Do not take Tylenol if you are taking prescription pain medications. ° ° ° ° °Follow up °Your surgeon may want to see you in the office following your access surgery. If so, this will be arranged at the time of your surgery. ° °Please call us immediately for any of the following conditions: ° °Increased pain, redness, drainage (pus) from your incision site °Fever of 101 degrees or higher °Severe or worsening pain at your incision site °Hand pain or numbness. ° °Reduce your risk of vascular disease: ° °Stop smoking. If you would like help, call [facility] at 1-800-QUIT-NOW (1-800-784-8669) or [facility] at 336-586-4000 ° °Manage your cholesterol °Maintain a desired weight °Control your diabetes °Keep your blood pressure down ° °Dialysis ° °It will take several weeks to several months for your new dialysis access to be ready for use. Your surgeon will determine when it is OK to use it. Your nephrologist will continue to direct your dialysis. You can continue to use your Permcath until your new access is ready for use. ° °If you have any questions, please call the office at [phone]. ° °

## 2018-05-23 NOTE — Telephone Encounter (Signed)
-----   Message from Ulyses Amor, [place] sent at 05/23/2018  8:28 AM EDT -----  S/P left AV fistula creation f/u in 5-6 weeks in [facility] with Duplex of fistula

## 2018-05-23 NOTE — Anesthesia Postprocedure Evaluation (Signed)
Anesthesia Post Note  Patient: Erin Liu  Procedure(s) Performed: ARTERIOVENOUS (AV) FISTULA CREATION LEFT ARM (Left )     Patient location during evaluation: PACU Anesthesia Type: MAC Level of consciousness: awake and alert, oriented and patient cooperative Pain management: pain level controlled Vital Signs Assessment: post-procedure vital signs reviewed and stable Respiratory status: spontaneous breathing, nonlabored ventilation and respiratory function stable Cardiovascular status: stable and blood pressure returned to baseline Postop Assessment: no apparent nausea or vomiting Anesthetic complications: no    Last Vitals:  Vitals:   05/23/18 0907 05/23/18 0924  BP: 138/90 140/76  Pulse: 93 93  Resp: 19 16  Temp:  36.6 C  SpO2: 96% 98%    Last Pain:  Vitals:   05/23/18 0906  TempSrc:   PainSc: 0-No pain                 Filemon Breton,E. Maico [place]ve[place]

## 2018-05-24 ENCOUNTER — Encounter (HOSPITAL_COMMUNITY): Payer: Self-pay | Admitting: Vascular Surgery

## 2018-05-28 DIAGNOSIS — N184 Chronic kidney disease, stage 4 (severe): Secondary | ICD-10-CM | POA: Diagnosis not present

## 2018-05-28 DIAGNOSIS — R5381 Other malaise: Secondary | ICD-10-CM | POA: Diagnosis not present

## 2018-06-22 ENCOUNTER — Other Ambulatory Visit: Payer: Self-pay

## 2018-06-22 DIAGNOSIS — N184 Chronic kidney disease, stage 4 (severe): Secondary | ICD-10-CM

## 2018-06-26 ENCOUNTER — Other Ambulatory Visit: Payer: Self-pay | Admitting: Internal Medicine

## 2018-06-28 DIAGNOSIS — N184 Chronic kidney disease, stage 4 (severe): Secondary | ICD-10-CM | POA: Diagnosis not present

## 2018-06-28 DIAGNOSIS — R5381 Other malaise: Secondary | ICD-10-CM | POA: Diagnosis not present

## 2018-07-05 ENCOUNTER — Telehealth (HOSPITAL_COMMUNITY): Payer: Self-pay | Admitting: Rehabilitation

## 2018-07-05 NOTE — Telephone Encounter (Signed)
The above patient or their representative was contacted and gave the following answers to these questions:         Do you have any of the following symptoms? No  Fever                    Cough                   Shortness of breath  Do  you have any of the following other symptoms? No   muscle pain         vomiting,        diarrhea        rash         weakness        red eye        abdominal pain         bruising          bruising or bleeding              joint pain           severe headache    Have you been in contact with someone who was or has been sick in the past 2 weeks? No  Yes                 Unsure                         Unable to assess   Does the person that you were in contact with have any of the following symptoms?   Cough         shortness of breath           muscle pain         vomiting,            diarrhea            rash            weakness           fever            red eye           abdominal pain           bruising  or  bleeding                joint pain                severe headache               Have you  or someone you have been in contact with traveled internationally in th last month? No        If yes, which countries?   Have you  or someone you have been in contact with traveled outside [place] in th last month? No         If yes, which state and city?   COMMENTS OR ACTION PLAN FOR THIS PATIENT:          

## 2018-07-06 ENCOUNTER — Encounter: Payer: Self-pay | Admitting: Family

## 2018-07-06 ENCOUNTER — Telehealth: Payer: Self-pay | Admitting: Internal Medicine

## 2018-07-06 ENCOUNTER — Encounter: Payer: Self-pay | Admitting: *Deleted

## 2018-07-06 ENCOUNTER — Ambulatory Visit (HOSPITAL_COMMUNITY)
Admission: RE | Admit: 2018-07-06 | Discharge: 2018-07-06 | Disposition: A | Discharge status: Home / Self Care | Payer: Medicare HMO | Source: Ambulatory Visit | Attending: Family | Admitting: Family

## 2018-07-06 ENCOUNTER — Ambulatory Visit (INDEPENDENT_AMBULATORY_CARE_PROVIDER_SITE_OTHER): Payer: Self-pay | Admitting: Family

## 2018-07-06 ENCOUNTER — Other Ambulatory Visit: Payer: Self-pay

## 2018-07-06 VITALS — BP 165/84 | HR 105 | Temp 97.4°F | Resp 16 | Ht 66.0 in | Wt 186.0 lb

## 2018-07-06 DIAGNOSIS — N184 Chronic kidney disease, stage 4 (severe): Secondary | ICD-10-CM

## 2018-07-06 DIAGNOSIS — I770 Arteriovenous fistula, acquired: Secondary | ICD-10-CM

## 2018-07-06 MED ORDER — VITAMIN B-12 ER 2000 MCG PO TBCR: 1.0000 | EXTENDED_RELEASE_TABLET | Freq: Every day | ORAL | 1 refills | Status: DC

## 2018-07-06 NOTE — Progress Notes (Signed)
CC: post op AVF duplex, s/p first stage  History of Present Illness  Erin Liu is a 78 y.o. (Aug 22, 1940) female who is s/p left first stage brachiobasilic AV fistula creation on 05-23-18 by Dr. Donzetta Matters.   She returns today for 5-6 weeks post op AVF duplex.   She has not started hemodialysis yet, states her nephrologist indicates that she does not need HD any time soon.   She is right hand dominant.   She has rarely used tobacco in the distant past.    Past Medical History:  Diagnosis Date  . ANEMIA, NORMOCYTIC   . Arthritis    back  . BREAST CYST, HX OF   . CARPAL TUNNEL SYNDROME, BILATERAL   . DIABETES MELLITUS, TYPE II   . Glaucoma   . HYPERLIPIDEMIA   . HYPERTENSION   . OBESITY   . PERIPHERAL NEUROPATHY   . RENAL FAILURE, CHRONIC    Stage 4    Social History Social History   Tobacco Use  . Smoking status: Never Smoker  . Smokeless tobacco: Never Used  Substance Use Topics  . Alcohol use: No  . Drug use: No    Family History Family History  Problem Relation Age of Onset  . Arthritis Son   . Alcohol abuse Son   . Diabetes Son   . Hypertension Son   . Colon cancer Son 57  . Heart attack Mother        Died age 90s  . Heart attack Father        No details 71s  . Heart disease Sister        Slight heart attack, died age 15    Surgical History Past Surgical History:  Procedure Laterality Date  . AV FISTULA PLACEMENT Left 05/23/2018   Procedure: ARTERIOVENOUS (AV) FISTULA CREATION LEFT ARM;  Surgeon: Waynetta Sandy, MD;  Location: [facility];  Service: Vascular;  Laterality: Left;  . CATARACT EXTRACTION BILATERAL W/ ANTERIOR VITRECTOMY  05/2010  . COLONOSCOPY     [facility]/7-8 years    Allergies  Allergen Reactions  . Lisinopril Cough    Current Outpatient Medications  Medication Sig Dispense Refill  . acetaminophen (TYLENOL) 325 MG tablet Take 2 tablets (650 mg total) by mouth every 6 (six) hours as needed for mild pain.    . Alcohol Swabs  (B-D SINGLE USE SWABS REGULAR) PADS Use as directed. Dx E11.9 200 each 2  . allopurinol (ZYLOPRIM) 100 MG tablet TAKE 2 TABLETS BY MOUTH DAILY (Patient taking differently: Take 200 mg by mouth daily. ) 60 tablet 5  . atorvastatin (LIPITOR) 40 MG tablet Take 1 tablet (40 mg total) by mouth daily. 90 tablet 2  . Blood Glucose Calibration (TRUE METRIX LEVEL 1) Low SOLN Use as directed Dx. E11.9 3 each 0  . Cyanocobalamin (VITAMIN B-12) 2000 MCG TBCR Take 1 tablet (2,000 mcg total) by mouth daily. 90 tablet 1  . glucose blood (TRUE METRIX BLOOD GLUCOSE TEST) test strip 1 each by Other route 2 (two) times daily. Use to check blood sugars twice a day. Dx E11.9 200 each 2  . latanoprost (XALATAN) 0.005 % ophthalmic solution Place 1 drop into both eyes at bedtime.  3  . TRUEPLUS LANCETS 33G MISC Use to help check blood sugars twice a day. Dx E11.9 200 each 2  . oxyCODONE-acetaminophen (PERCOCET/ROXICET) 5-325 MG tablet Take 1 tablet by mouth every 6 (six) hours as needed. (Patient not taking: Reported on 07/06/2018) 6 tablet 0  No current facility-administered medications for this visit.      REVIEW OF SYSTEMS: see HPI for pertinent positives and negatives    PHYSICAL EXAMINATION:  Vitals:   07/06/18 1208  BP: (!) 165/84  Pulse: (!) 105  Resp: 16  Temp: (!) 97.4 F (36.3 C)  TempSrc: Oral  SpO2: 99%  Weight: 186 lb (84.4 kg)  Height: 5\' 6"  (1.676 m)   Body mass index is 30.02 kg/m.  General: The patient appears her stated age.   HEENT:  No gross abnormalities Pulmonary: Respirations are non-labored Abdomen: Soft and non-tender with. Musculoskeletal: There are no major deformities.   Neurologic: No focal weakness or paresthesias are detected, Skin: There are no ulcer or rashes noted. Psychiatric: The patient has normal affect. Cardiovascular: There is a regular rate and rhythm without significant murmur appreciated.   Non-Invasive Vascular Imaging  Left arm Access Duplex  (Date:  07/06/2018):  Findings: +--------------------+----------+-----------------+--------+ AVF                 PSV (cm/s)Flow Vol (mL/min)Comments +--------------------+----------+-----------------+--------+ Native artery inflow   287          1103                +--------------------+----------+-----------------+--------+ AVF Anastomosis        922                              +--------------------+----------+-----------------+--------+   +------------+----------+-------------+----------+----------------+ OUTFLOW VEINPSV (cm/s)Diameter (cm)Depth (cm)    Describe     +------------+----------+-------------+----------+----------------+ Shoulder       118        0.65        1.21                    +------------+----------+-------------+----------+----------------+ Prox UA        296        0.64        0.62                    +------------+----------+-------------+----------+----------------+ Mid UA         583        0.45        0.31   competing branch +------------+----------+-------------+----------+----------------+ Dist UA        238        0.64        0.19                    +------------+----------+-------------+----------+----------------+ AC Fossa       397        0.88        0.18                    +------------+----------+-------------+----------+----------------+   Summary: Patent left arteriovenous fistula with increased velocity noted at the anastamosis in the antecubital fossa area with audible bruit..   Medical Decision Making  Erin Liu is a 78 y.o. female who is s/p left first stage brachiobasilic AV fistula creation on 05-23-18 by Dr. Donzetta Matters.    Will schedule second stage left  brachiobasilic AV fistula with Dr. Donzetta Matters.    Clemon Chambers, RN, MSN, FNP-C [facility] Office: [phone]  07/06/2018, 12:11 PM  Clinic MD: Oneida Alar

## 2018-07-06 NOTE — Telephone Encounter (Signed)
Copied from [place] [id number]. Topic: Quick Communication - Rx Refill/Question >> Jul 06, 2018 10:50 AM Nils Flack wrote: Medication: Cyanocobalamin (VITAMIN B-12) 2000 MCG TBCR  Has the patient contacted their pharmacy? Yes.   (Agent: If no, request that the patient contact the pharmacy for the refill.) (Agent: If yes, when and what did the pharmacy advise?) to call office   Preferred Pharmacy (with phone number or street name): [facility] [address]  Cb is [phone]  Agent: Please be advised that RX refills may take up to 3 business days. We ask that you follow-up with your pharmacy.

## 2018-07-06 NOTE — Telephone Encounter (Signed)
Rx sent 

## 2018-07-07 ENCOUNTER — Encounter: Payer: Medicare HMO | Admitting: Family

## 2018-07-07 ENCOUNTER — Encounter (HOSPITAL_COMMUNITY): Payer: Medicare HMO

## 2018-07-10 ENCOUNTER — Other Ambulatory Visit: Payer: Self-pay | Admitting: *Deleted

## 2018-07-14 ENCOUNTER — Other Ambulatory Visit: Payer: Self-pay | Admitting: *Deleted

## 2018-07-23 NOTE — Progress Notes (Signed)
Virtual Visit via Video Note  I connected with Erin Liu on 07/24/18 at  1:30 PM EDT by a video enabled telemedicine application and verified that I am speaking with the correct person using two identifiers.   I discussed the limitations of evaluation and management by telemedicine and the availability of in person appointments. The patient expressed understanding and agreed to proceed.  The patient is currently at home and I am in the office.  Her daughter is with her at home.  No referring provider.    History of Present Illness: She is here for follow up of her chronic medical conditions.   She is not exercising regularly.     CKD:  She follows with nephrology.  She did have a fistula placed in the left arm.  She is not yet on hemodialysis.  Hypertension: She is not taking any medication - it was discontinued last year due to hypotension. She is compliant with a low sodium diet.  She denies chest pain, palpitations, edema, shortness of breath and regular headaches.  Since discontinuing her blood pressure medication she has not had any lightheadedness, dizziness or near syncope.  Hyperlipidemia: She is taking her medication daily. She is compliant with a low fat/cholesterol diet. She denies myalgias.   Gout:  She is taking allopurinol daily.  She denies gout symptoms since her last visit.  Right hand tingling:  All the fingers.  Does not go into hand.  It is constant.  No other tingling. She denies neck, shoulder or wrist pain on the right.  Her fistula is on the left.    Review of Systems  Constitutional: Negative for chills and fever.  Respiratory: Positive for cough. Negative for shortness of breath and wheezing.   Cardiovascular: Negative for chest pain, palpitations and leg swelling.  Neurological: Negative for dizziness (no lightheadedness) and headaches.     Social History   Socioeconomic History  . Marital status: Widowed    Spouse name: Not on file  . Number  of children: 3  . Years of education: Not on file  . Highest education level: Not on file  Occupational History  . Not on file  Social Needs  . Financial resource strain: Not hard at all  . Food insecurity:    Worry: Never true    Inability: Never true  . Transportation needs:    Medical: No    Non-medical: No  Tobacco Use  . Smoking status: Never Smoker  . Smokeless tobacco: Never Used  Substance and Sexual Activity  . Alcohol use: No  . Drug use: No  . Sexual activity: Not Currently  Lifestyle  . Physical activity:    Days per week: 0 days    Minutes per session: 0 min  . Stress: Not at all  Relationships  . Social connections:    Talks on phone: More than three times a week    Gets together: Once a week    Attends religious service: 1 to 4 times per year    Active member of club or organization: Not on file    Attends meetings of clubs or organizations: Not on file    Relationship status: Not on file  Other Topics Concern  . Not on file  Social History Narrative   Widowed, lives with son. Retired Medical illustrator     Observations/Objective: Appears well in NAD   Assessment and Plan:  See Problem List for Assessment and Plan of chronic medical problems.   Follow  Up Instructions:    I discussed the assessment and treatment plan with the patient. The patient was provided an opportunity to ask questions and all were answered. The patient agreed with the plan and demonstrated an understanding of the instructions.   The patient was advised to call back or seek an in-person evaluation if the symptoms worsen or if the condition fails to improve as anticipated.  FU 6 months  Binnie Rail, MD

## 2018-07-24 ENCOUNTER — Ambulatory Visit (INDEPENDENT_AMBULATORY_CARE_PROVIDER_SITE_OTHER): Payer: Medicare HMO | Admitting: Internal Medicine

## 2018-07-24 ENCOUNTER — Encounter: Payer: Self-pay | Admitting: Internal Medicine

## 2018-07-24 DIAGNOSIS — R202 Paresthesia of skin: Secondary | ICD-10-CM | POA: Diagnosis not present

## 2018-07-24 DIAGNOSIS — I10 Essential (primary) hypertension: Secondary | ICD-10-CM

## 2018-07-24 DIAGNOSIS — E1365 Other specified diabetes mellitus with hyperglycemia: Secondary | ICD-10-CM

## 2018-07-24 DIAGNOSIS — IMO0002 Reserved for concepts with insufficient information to code with codable children: Secondary | ICD-10-CM

## 2018-07-24 DIAGNOSIS — N184 Chronic kidney disease, stage 4 (severe): Secondary | ICD-10-CM

## 2018-07-24 DIAGNOSIS — E1329 Other specified diabetes mellitus with other diabetic kidney complication: Secondary | ICD-10-CM | POA: Diagnosis not present

## 2018-07-24 DIAGNOSIS — E785 Hyperlipidemia, unspecified: Secondary | ICD-10-CM

## 2018-07-24 DIAGNOSIS — M10371 Gout due to renal impairment, right ankle and foot: Secondary | ICD-10-CM | POA: Diagnosis not present

## 2018-07-24 DIAGNOSIS — E1322 Other specified diabetes mellitus with diabetic chronic kidney disease: Secondary | ICD-10-CM | POA: Diagnosis not present

## 2018-07-24 MED ORDER — VITAMIN B-12 ER 2000 MCG PO TBCR: 1.0000 | EXTENDED_RELEASE_TABLET | Freq: Every day | ORAL | 1 refills | Status: AC

## 2018-07-24 NOTE — Assessment & Plan Note (Signed)
Currently not on any medication because he was experiencing symptomatic hypotension with near syncope Monitor without medication

## 2018-07-24 NOTE — Assessment & Plan Note (Signed)
She has not had any gout symptoms since she was here last Continue allopurinol 200 mg daily

## 2018-07-24 NOTE — Assessment & Plan Note (Signed)
Continue atorvastatin

## 2018-07-24 NOTE — Assessment & Plan Note (Signed)
Right hand tingling No wrist pain, shoulder or neck pain Some decreased dexterity Likely pinched nerve, but unsure where Discussed EMG, but after describing the prior report to hold off for now If symptoms worsen or persist can consider neurology referral for further evaluation

## 2018-07-24 NOTE — Assessment & Plan Note (Signed)
Following with nephrology Has had left arm fistula placed

## 2018-07-24 NOTE — Assessment & Plan Note (Signed)
Diet controlled  Lab Results  Component Value Date   HGBA1C 6.5 01/10/2018   Sugars have been well controlled so we will hold off on getting blood work for now until her next visit

## 2018-07-25 DIAGNOSIS — N184 Chronic kidney disease, stage 4 (severe): Secondary | ICD-10-CM | POA: Diagnosis not present

## 2018-07-25 DIAGNOSIS — N2581 Secondary hyperparathyroidism of renal origin: Secondary | ICD-10-CM | POA: Diagnosis not present

## 2018-07-25 DIAGNOSIS — D631 Anemia in chronic kidney disease: Secondary | ICD-10-CM | POA: Diagnosis not present

## 2018-07-27 DIAGNOSIS — D631 Anemia in chronic kidney disease: Secondary | ICD-10-CM | POA: Diagnosis not present

## 2018-07-27 DIAGNOSIS — I129 Hypertensive chronic kidney disease with stage 1 through stage 4 chronic kidney disease, or unspecified chronic kidney disease: Secondary | ICD-10-CM | POA: Diagnosis not present

## 2018-07-27 DIAGNOSIS — N2581 Secondary hyperparathyroidism of renal origin: Secondary | ICD-10-CM | POA: Diagnosis not present

## 2018-07-27 DIAGNOSIS — N184 Chronic kidney disease, stage 4 (severe): Secondary | ICD-10-CM | POA: Diagnosis not present

## 2018-07-28 DIAGNOSIS — R5381 Other malaise: Secondary | ICD-10-CM | POA: Diagnosis not present

## 2018-07-28 DIAGNOSIS — N184 Chronic kidney disease, stage 4 (severe): Secondary | ICD-10-CM | POA: Diagnosis not present

## 2018-08-01 ENCOUNTER — Encounter (HOSPITAL_COMMUNITY): Payer: Self-pay | Admitting: *Deleted

## 2018-08-01 ENCOUNTER — Other Ambulatory Visit: Payer: Self-pay

## 2018-08-01 NOTE — Progress Notes (Signed)
Spoke with pt's daughter, Erin Liu (DPR on file). She states pt does not have a cardiac history. Pt is a type 2 diabetic. Does not check her blood sugar at home, does have a meter at home. Last A1C was 6.5 on 01/10/18. Instructed Erin Liu to have pt check her blood sugar the morning of surgery. If blood sugar is 70 or below, treat with 1/2 cup of clear juice (apple or cranberry) and recheck blood sugar 15 minutes after drinking juice. Pt has some cognitive issues especially with understanding what is asked of her. [name] daughter will be coming in with her to answer any questions and making sure pt understands what is being done or asked. (Per Visitation Policy effective 5/74/73) Erin Liu understands that she will leave the hospital once she is instructed to do so.   Erin Liu states she can get her mother to Erin Liu for the Covid test on Wednesday. I told her someone would call her today and set up an appt. Instructed Erin Liu, that after testing that pt needed to go home and stay home until morning of surgery. Her son lives with her and I instructed Erin Liu that he is to stay home as much as possible and wear a mask if he does go out and wash hands when comes back in. She states that would not be a problem.  Coronavirus Screening  Have you experienced the following symptoms:  Cough NO Fever (>100.55F) NO   Runny nose NO Sore throat NO Difficulty breathing/shortness of breath  NO  Have you or a family member traveled in the last 14 days and where? NO     Erin Liu was reminded that hospital visitation restrictions are in effect and the importance of the restrictions.

## 2018-08-02 ENCOUNTER — Other Ambulatory Visit (HOSPITAL_COMMUNITY)
Admission: RE | Admit: 2018-08-02 | Discharge: 2018-08-02 | Disposition: A | Discharge status: Home / Self Care | Payer: Medicare HMO | Source: Ambulatory Visit | Attending: Vascular Surgery | Admitting: Vascular Surgery

## 2018-08-02 DIAGNOSIS — Z1159 Encounter for screening for other viral diseases: Secondary | ICD-10-CM | POA: Diagnosis not present

## 2018-08-02 LAB — SARS CORONAVIRUS 2 BY RT PCR (HOSPITAL ORDER, PERFORMED IN ~~LOC~~ HOSPITAL LAB): SARS Coronavirus 2: NEGATIVE

## 2018-08-02 NOTE — Anesthesia Preprocedure Evaluation (Addendum)
Anesthesia Evaluation  Patient identified by MRN, date of birth, ID band Patient awake    Reviewed: Allergy & Precautions, NPO status , Patient's Chart, lab work & pertinent test results  Airway Mallampati: II  TM Distance: >3 FB Neck ROM: Full    Dental  (+) Poor Dentition, Missing   Pulmonary neg pulmonary ROS,    Pulmonary exam normal breath sounds clear to auscultation       Cardiovascular hypertension, Normal cardiovascular exam Rhythm:Regular Rate:Normal  ECG: SR, rate 70. Sinus rhythm Left bundle branch block  ECHO: LV EF: 50% -   55%   Neuro/Psych negative neurological ROS  negative psych ROS   GI/Hepatic negative GI ROS, Neg liver ROS,   Endo/Other  diabetes  Renal/GU CRFRenal disease     Musculoskeletal Gout   Abdominal (+) + obese,   Peds  Hematology  (+) anemia , HLD   Anesthesia Other Findings CHRONIC KIDNEY DISEASE FOR HEMODIALYSIS ACCESS  Reproductive/Obstetrics                           Anesthesia Physical Anesthesia Plan  ASA: III  Anesthesia Plan: General   Post-op Pain Management:    Induction: Intravenous  PONV Risk Score and Plan: 3 and Ondansetron, Dexamethasone and Treatment may vary due to age or medical condition  Airway Management Planned: LMA  Additional Equipment:   Intra-op Plan:   Post-operative Plan: Extubation in OR  Informed Consent: I have reviewed the patients History and Physical, chart, labs and discussed the procedure including the risks, benefits and alternatives for the proposed anesthesia with the patient or authorized representative who has indicated his/her understanding and acceptance.     Dental advisory given  Plan Discussed with: CRNA  Anesthesia Plan Comments:        Anesthesia Quick Evaluation

## 2018-08-03 ENCOUNTER — Encounter (HOSPITAL_COMMUNITY): Payer: Self-pay

## 2018-08-03 ENCOUNTER — Other Ambulatory Visit: Payer: Self-pay

## 2018-08-03 ENCOUNTER — Ambulatory Visit (HOSPITAL_COMMUNITY): Payer: Medicare HMO | Admitting: Anesthesiology

## 2018-08-03 ENCOUNTER — Encounter (HOSPITAL_COMMUNITY)
Admission: RE | Disposition: A | Discharge status: Home / Self Care | Payer: Self-pay | Source: Home / Self Care | Attending: Vascular Surgery

## 2018-08-03 ENCOUNTER — Ambulatory Visit (HOSPITAL_COMMUNITY)
Admission: RE | Admit: 2018-08-03 | Discharge: 2018-08-03 | Disposition: A | Discharge status: Home / Self Care | Payer: Medicare HMO | Attending: Vascular Surgery | Admitting: Vascular Surgery

## 2018-08-03 DIAGNOSIS — I129 Hypertensive chronic kidney disease with stage 1 through stage 4 chronic kidney disease, or unspecified chronic kidney disease: Secondary | ICD-10-CM | POA: Diagnosis not present

## 2018-08-03 DIAGNOSIS — H409 Unspecified glaucoma: Secondary | ICD-10-CM | POA: Insufficient documentation

## 2018-08-03 DIAGNOSIS — E1142 Type 2 diabetes mellitus with diabetic polyneuropathy: Secondary | ICD-10-CM | POA: Insufficient documentation

## 2018-08-03 DIAGNOSIS — M109 Gout, unspecified: Secondary | ICD-10-CM | POA: Diagnosis not present

## 2018-08-03 DIAGNOSIS — E785 Hyperlipidemia, unspecified: Secondary | ICD-10-CM | POA: Insufficient documentation

## 2018-08-03 DIAGNOSIS — Z87891 Personal history of nicotine dependence: Secondary | ICD-10-CM | POA: Diagnosis not present

## 2018-08-03 DIAGNOSIS — N184 Chronic kidney disease, stage 4 (severe): Secondary | ICD-10-CM | POA: Diagnosis not present

## 2018-08-03 DIAGNOSIS — N185 Chronic kidney disease, stage 5: Secondary | ICD-10-CM | POA: Diagnosis not present

## 2018-08-03 DIAGNOSIS — Z79899 Other long term (current) drug therapy: Secondary | ICD-10-CM | POA: Diagnosis not present

## 2018-08-03 DIAGNOSIS — E1122 Type 2 diabetes mellitus with diabetic chronic kidney disease: Secondary | ICD-10-CM | POA: Diagnosis not present

## 2018-08-03 DIAGNOSIS — Z992 Dependence on renal dialysis: Secondary | ICD-10-CM | POA: Diagnosis not present

## 2018-08-03 HISTORY — PX: BASCILIC VEIN TRANSPOSITION: SHX5742

## 2018-08-03 LAB — GLUCOSE, CAPILLARY
Glucose-Capillary: 113 mg/dL — ABNORMAL HIGH (ref 70–99)
Glucose-Capillary: 116 mg/dL — ABNORMAL HIGH (ref 70–99)

## 2018-08-03 LAB — POCT I-STAT 4, (NA,K, GLUC, HGB,HCT)
Glucose, Bld: 104 mg/dL — ABNORMAL HIGH (ref 70–99)
HCT: 25 % — ABNORMAL LOW (ref 36.0–46.0)
Hemoglobin: 8.5 g/dL — ABNORMAL LOW (ref 12.0–15.0)
Potassium: 4.4 mmol/L (ref 3.5–5.1)
Sodium: 140 mmol/L (ref 135–145)

## 2018-08-03 LAB — HEMOGLOBIN A1C
Hgb A1c MFr Bld: 5.6 % (ref 4.8–5.6)
Mean Plasma Glucose: 114.02 mg/dL

## 2018-08-03 SURGERY — TRANSPOSITION, VEIN, BASILIC
Anesthesia: Monitor Anesthesia Care | Site: Arm Upper | Laterality: Left

## 2018-08-03 MED ORDER — LIDOCAINE 2% (20 MG/ML) 5 ML SYRINGE
INTRAMUSCULAR | Status: DC | PRN
  Administered 2018-08-03: 60 mg via INTRAVENOUS

## 2018-08-03 MED ORDER — CEFAZOLIN SODIUM-DEXTROSE 2-4 GM/100ML-% IV SOLN
INTRAVENOUS | Status: AC
  Filled 2018-08-03: qty 100

## 2018-08-03 MED ORDER — LIDOCAINE HCL (PF) 1 % IJ SOLN
INTRAMUSCULAR | Status: AC
  Filled 2018-08-03: qty 30

## 2018-08-03 MED ORDER — 0.9 % SODIUM CHLORIDE (POUR BTL) OPTIME
TOPICAL | Status: DC | PRN
  Administered 2018-08-03: 08:00:00 1000 mL

## 2018-08-03 MED ORDER — SODIUM CHLORIDE 0.9 % IV SOLN
INTRAVENOUS | Status: AC
  Filled 2018-08-03: qty 1.2

## 2018-08-03 MED ORDER — OXYCODONE HCL 5 MG PO TABS: 5.0000 mg | ORAL_TABLET | Freq: Four times a day (QID) | ORAL | 0 refills | Status: DC | PRN

## 2018-08-03 MED ORDER — ACETAMINOPHEN 500 MG PO TABS
1000.0000 mg | ORAL_TABLET | Freq: Once | ORAL | Status: AC
  Administered 2018-08-03: 1000 mg via ORAL
  Filled 2018-08-03: qty 2

## 2018-08-03 MED ORDER — PROPOFOL 10 MG/ML IV BOLUS
INTRAVENOUS | Status: AC
  Filled 2018-08-03: qty 20

## 2018-08-03 MED ORDER — SODIUM CHLORIDE 0.9 % IV SOLN: INTRAVENOUS | Status: DC

## 2018-08-03 MED ORDER — CHLORHEXIDINE GLUCONATE 4 % EX LIQD: 60.0000 mL | Freq: Once | CUTANEOUS | Status: DC

## 2018-08-03 MED ORDER — PROPOFOL 10 MG/ML IV BOLUS
INTRAVENOUS | Status: DC | PRN
  Administered 2018-08-03: 70 mg via INTRAVENOUS

## 2018-08-03 MED ORDER — PHENYLEPHRINE HCL (PRESSORS) 10 MG/ML IV SOLN
INTRAVENOUS | Status: DC | PRN
  Administered 2018-08-03 (×2): 80 ug via INTRAVENOUS

## 2018-08-03 MED ORDER — FENTANYL CITRATE (PF) 100 MCG/2ML IJ SOLN
INTRAMUSCULAR | Status: DC | PRN
  Administered 2018-08-03 (×2): 25 ug via INTRAVENOUS

## 2018-08-03 MED ORDER — SODIUM CHLORIDE 0.9 % IV SOLN
INTRAVENOUS | Status: DC | PRN
  Administered 2018-08-03 (×2): via INTRAVENOUS

## 2018-08-03 MED ORDER — SODIUM CHLORIDE 0.9 % IV SOLN
INTRAVENOUS | Status: DC | PRN
  Administered 2018-08-03: 500 mL

## 2018-08-03 MED ORDER — CEFAZOLIN SODIUM-DEXTROSE 2-4 GM/100ML-% IV SOLN
2.0000 g | INTRAVENOUS | Status: AC
  Administered 2018-08-03: 2 g via INTRAVENOUS

## 2018-08-03 MED ORDER — ONDANSETRON HCL 4 MG/2ML IJ SOLN: 4.0000 mg | Freq: Once | INTRAMUSCULAR | Status: DC | PRN

## 2018-08-03 MED ORDER — SODIUM CHLORIDE 0.9 % IV SOLN
INTRAVENOUS | Status: DC | PRN
  Administered 2018-08-03: 08:00:00 50 ug/min via INTRAVENOUS

## 2018-08-03 MED ORDER — DEXAMETHASONE SODIUM PHOSPHATE 10 MG/ML IJ SOLN
INTRAMUSCULAR | Status: DC | PRN
  Administered 2018-08-03: 5 mg via INTRAVENOUS

## 2018-08-03 MED ORDER — FENTANYL CITRATE (PF) 250 MCG/5ML IJ SOLN
INTRAMUSCULAR | Status: AC
  Filled 2018-08-03: qty 5

## 2018-08-03 MED ORDER — FENTANYL CITRATE (PF) 100 MCG/2ML IJ SOLN: 25.0000 ug | INTRAMUSCULAR | Status: DC | PRN

## 2018-08-03 MED ORDER — ONDANSETRON HCL 4 MG/2ML IJ SOLN
INTRAMUSCULAR | Status: DC | PRN
  Administered 2018-08-03: 4 mg via INTRAVENOUS

## 2018-08-03 SURGICAL SUPPLY — 36 items
ADH SKN CLS APL DERMABOND .7 (GAUZE/BANDAGES/DRESSINGS) ×1
ARMBAND PINK RESTRICT EXTREMIT (MISCELLANEOUS) ×3 IMPLANT
CANISTER SUCT 3000ML PPV (MISCELLANEOUS) ×3 IMPLANT
CLIP VESOCCLUDE MED 24/CT (CLIP) IMPLANT
CLIP VESOCCLUDE MED 6/CT (CLIP) IMPLANT
CLIP VESOCCLUDE SM WIDE 24/CT (CLIP) IMPLANT
CLIP VESOCCLUDE SM WIDE 6/CT (CLIP) IMPLANT
COVER PROBE W GEL 5X96 (DRAPES) ×3 IMPLANT
COVER WAND RF STERILE (DRAPES) ×3 IMPLANT
DERMABOND ADVANCED (GAUZE/BANDAGES/DRESSINGS) ×2
DERMABOND ADVANCED .7 DNX12 (GAUZE/BANDAGES/DRESSINGS) ×1 IMPLANT
ELECT REM PT RETURN 9FT ADLT (ELECTROSURGICAL) ×3
ELECTRODE REM PT RTRN 9FT ADLT (ELECTROSURGICAL) ×1 IMPLANT
GLOVE BIO SURGEON STRL SZ 6.5 (GLOVE) ×3 IMPLANT
GLOVE BIO SURGEON STRL SZ7.5 (GLOVE) ×3 IMPLANT
GLOVE BIO SURGEONS STRL SZ 6.5 (GLOVE) ×3
GLOVE BIOGEL PI IND STRL 7.5 (GLOVE) IMPLANT
GLOVE BIOGEL PI INDICATOR 7.5 (GLOVE) ×2
GLOVE ECLIPSE 7.0 STRL STRAW (GLOVE) ×2 IMPLANT
GOWN STRL REUS W/ TWL LRG LVL3 (GOWN DISPOSABLE) ×2 IMPLANT
GOWN STRL REUS W/ TWL XL LVL3 (GOWN DISPOSABLE) ×1 IMPLANT
GOWN STRL REUS W/TWL LRG LVL3 (GOWN DISPOSABLE) ×6
GOWN STRL REUS W/TWL XL LVL3 (GOWN DISPOSABLE) ×3
KIT BASIN OR (CUSTOM PROCEDURE TRAY) ×3 IMPLANT
KIT TURNOVER KIT B (KITS) ×3 IMPLANT
NS IRRIG 1000ML POUR BTL (IV SOLUTION) ×3 IMPLANT
PACK CV ACCESS (CUSTOM PROCEDURE TRAY) ×3 IMPLANT
PAD ARMBOARD 7.5X6 YLW CONV (MISCELLANEOUS) ×6 IMPLANT
SUT MNCRL AB 4-0 PS2 18 (SUTURE) ×3 IMPLANT
SUT PROLENE 6 0 BV (SUTURE) ×3 IMPLANT
SUT SILK 2 0 SH (SUTURE) IMPLANT
SUT VIC AB 3-0 SH 27 (SUTURE) ×6
SUT VIC AB 3-0 SH 27X BRD (SUTURE) ×1 IMPLANT
TOWEL GREEN STERILE (TOWEL DISPOSABLE) ×3 IMPLANT
UNDERPAD 30X30 (UNDERPADS AND DIAPERS) ×3 IMPLANT
WATER STERILE IRR 1000ML POUR (IV SOLUTION) ×3 IMPLANT

## 2018-08-03 NOTE — Op Note (Signed)
    Patient name: NAW LASALA MRN: [id number] DOB: December 07, 1940 Sex: female  08/03/2018 Pre-operative Diagnosis: ckd Post-operative diagnosis:  Same Surgeon:  Erlene Quan C. Donzetta Matters, MD Assistant: Leontine Locket, PA Procedure Performed: Left arm second stage basilic vein transposition fistula  Indications: 78 year old female is undergone for stage basilic vein fistula creation.  This was actually quite superficial as strong thrill is indicated for transposition.  Findings: Fistula superficial was identified with ultrasound.  Branches were ligated and it was tunneled laterally.  At completion there is a strong thrill and a palpable radial pulse at the wrist.   Procedure:  The patient was identified in the holding area and taken to the operating room where she is placed upon the operative table and general anesthesia induced.  She was to the prepped draped in left upper extremity usual fashion, antibiotics were administered and a timeout was called.  We began using ultrasound to identify the fistula.  Incisions were made overlying 2 areas of this.  We dissected out the fistula.  Branches were taken between clips and ties.  This was marked for orientation.  We clamped near the antecubitum and divided.  We flushed with heparinized saline and clamped the fistula itself.  Was tunneled laterally.  Was sewn end-to-end with 6-0 Prolene suture.  Prior to completion anastomosis allowed flushing by directions.  Upon completion was a very strong thrill and a palpable radial artery pulse at the wrist.  We obtain hemostasis irrigated both wounds closed in layers with Vicryl and Monocryl.  She was then awakened anesthesia having tolerated procedure without immediate complication.  All counts were correct at completion.  EBL 20 cc   Melanny Wire C. Donzetta Matters, MD [facility] Office: [phone] Pager: [phone]

## 2018-08-03 NOTE — Transfer of Care (Signed)
Immediate Anesthesia Transfer of Care Note  Patient: Erin Liu  Procedure(s) Performed: SECOND STAGE BASILIC VEIN TRANSPOSITION LEFT ARM (Left Arm Upper)  Patient Location: PACU  Anesthesia Type:General  Level of Consciousness: awake, alert  and oriented  Airway & Oxygen Therapy: Patient Spontanous Breathing  Post-op Assessment: Report given to RN, Post -op Vital signs reviewed and stable and Patient moving all extremities X 4  Post vital signs: Reviewed and stable  Last Vitals:  Vitals Value Taken Time  BP 131/70 08/03/2018  8:36 AM  Temp    Pulse 84 08/03/2018  8:36 AM  Resp 15 08/03/2018  8:36 AM  SpO2 95 % 08/03/2018  8:36 AM  Vitals shown include unvalidated device data.  Last Pain:  Vitals:   08/03/18 0640  TempSrc:   PainSc: 0-No pain         Complications: No apparent anesthesia complications

## 2018-08-03 NOTE — Anesthesia Procedure Notes (Signed)
Procedure Name: LMA Insertion Date/Time: 08/03/2018 7:29 AM Performed by: Neldon Newport, CRNA Pre-anesthesia Checklist: Timeout performed, Patient being monitored, Suction available, Emergency Drugs available and Patient identified Patient Re-evaluated:Patient Re-evaluated prior to induction Oxygen Delivery Method: Circle system utilized Preoxygenation: Pre-oxygenation with 100% oxygen Induction Type: IV induction Ventilation: Mask ventilation without difficulty LMA: LMA inserted LMA Size: 4.0 Number of attempts: 1 Placement Confirmation: breath sounds checked- equal and bilateral,  positive ETCO2 and ETT inserted through vocal cords under direct vision Tube secured with: Tape Dental Injury: Teeth and Oropharynx as per pre-operative assessment

## 2018-08-03 NOTE — H&P (Signed)
   History and Physical Update  The patient was interviewed and re-examined.  The patient's previous History and Physical has been reviewed and is unchanged from recent office visit.   Tea Collums C. Donzetta Matters, MD [facility] Office: [phone] Pager: [phone]   08/03/2018, 7:18 AM

## 2018-08-03 NOTE — Discharge Instructions (Signed)
° °  [facility]  Discharge Instructions  AV Fistula or Graft Surgery for Dialysis Access  Please refer to the following instructions for your post-procedure care. Your surgeon or physician assistant will discuss any changes with you.  Activity  You may drive the day following your surgery, if you are comfortable and no longer taking prescription pain medication. Resume full activity as the soreness in your incision resolves.  Bathing/Showering  You may shower after you go home. Keep your incision dry for 48 hours. Do not soak in a bathtub, hot tub, or swim until the incision heals completely. You may not shower if you have a hemodialysis catheter.  Incision Care  Clean your incision with mild soap and water after 48 hours. Pat the area dry with a clean towel. You do not need a bandage unless otherwise instructed. Do not apply any ointments or creams to your incision. You may have skin glue on your incision. Do not peel it off. It will come off on its own in about one week. Your arm may swell a bit after surgery. To reduce swelling use pillows to elevate your arm so it is above your heart. Your doctor will tell you if you need to lightly wrap your arm with an ACE bandage.  Diet  Resume your normal diet. There are not special food restrictions following this procedure. In order to heal from your surgery, it is CRITICAL to get adequate nutrition. Your body requires vitamins, minerals, and protein. Vegetables are the best source of vitamins and minerals. Vegetables also provide the perfect balance of protein. Processed food has little nutritional value, so try to avoid this.  Medications  Resume taking all of your medications. If your incision is causing pain, you may take over-the counter pain relievers such as acetaminophen (Tylenol). If you were prescribed a stronger pain medication, please be aware these medications can cause nausea and constipation. Prevent  nausea by taking the medication with a snack or meal. Avoid constipation by drinking plenty of fluids and eating foods with high amount of fiber, such as fruits, vegetables, and grains.  Do not take Tylenol if you are taking prescription pain medications.  Follow up Your surgeon may want to see you in the office following your access surgery. If so, this will be arranged at the time of your surgery.  Please call us immediately for any of the following conditions:  Increased pain, redness, drainage (pus) from your incision site Fever of 101 degrees or higher Severe or worsening pain at your incision site Hand pain or numbness.  Reduce your risk of vascular disease:  Stop smoking. If you would like help, call [facility] at 1-800-QUIT-NOW (339) 640-0541) or [place] at [place] your cholesterol Maintain a desired weight Control your diabetes Keep your blood pressure down  Dialysis  It will take several weeks to several months for your new dialysis access to be ready for use. Your surgeon will determine when it is okay to use it. Your nephrologist will continue to direct your dialysis. You can continue to use your Permcath until your new access is ready for use.   08/03/2018 DEBORA STOCKDALE [id number] 08-15-1940  Surgeon(s): Waynetta Sandy, MD  Procedure(s): SECOND STAGE BASILIC VEIN TRANSPOSITION LEFT ARM  x Do not stick fistula for 6 weeks    If you have any questions, please call the office at [phone].

## 2018-08-03 NOTE — Anesthesia Postprocedure Evaluation (Signed)
Anesthesia Post Note  Patient: Erin Liu  Procedure(s) Performed: SECOND STAGE BASILIC VEIN TRANSPOSITION LEFT ARM (Left Arm Upper)     Patient location during evaluation: PACU Anesthesia Type: General Level of consciousness: awake and alert Pain management: pain level controlled Vital Signs Assessment: post-procedure vital signs reviewed and stable Respiratory status: spontaneous breathing, nonlabored ventilation, respiratory function stable and patient connected to nasal cannula oxygen Cardiovascular status: blood pressure returned to baseline and stable Postop Assessment: no apparent nausea or vomiting Anesthetic complications: no    Last Vitals:  Vitals:   08/03/18 0920 08/03/18 0935  BP: 137/65 135/63  Pulse: 82 85  Resp: 18 20  Temp:  36.6 C  SpO2: 96% 92%    Last Pain:  Vitals:   08/03/18 0935  TempSrc:   PainSc: 0-No pain                 Ryan P Ellender

## 2018-08-04 ENCOUNTER — Encounter (HOSPITAL_COMMUNITY): Payer: Self-pay | Admitting: Vascular Surgery

## 2018-08-04 NOTE — Addendum Note (Signed)
Addendum  created 08/04/18 1932 by Murvin Natal, MD   Attestation recorded in [place], [place] filed

## 2018-08-18 ENCOUNTER — Other Ambulatory Visit: Payer: Self-pay | Admitting: Internal Medicine

## 2018-08-21 ENCOUNTER — Encounter: Payer: Self-pay | Admitting: Podiatry

## 2018-08-21 ENCOUNTER — Other Ambulatory Visit: Payer: Self-pay

## 2018-08-21 ENCOUNTER — Ambulatory Visit (INDEPENDENT_AMBULATORY_CARE_PROVIDER_SITE_OTHER): Payer: Medicare HMO | Admitting: Podiatry

## 2018-08-21 VITALS — Temp 97.3°F

## 2018-08-21 DIAGNOSIS — M79674 Pain in right toe(s): Secondary | ICD-10-CM

## 2018-08-21 DIAGNOSIS — B353 Tinea pedis: Secondary | ICD-10-CM

## 2018-08-21 DIAGNOSIS — M79675 Pain in left toe(s): Secondary | ICD-10-CM | POA: Diagnosis not present

## 2018-08-21 DIAGNOSIS — B351 Tinea unguium: Secondary | ICD-10-CM

## 2018-08-21 MED ORDER — CICLOPIROX OLAMINE 0.77 % EX CREA: TOPICAL_CREAM | CUTANEOUS | 1 refills | Status: DC

## 2018-08-21 NOTE — Patient Instructions (Addendum)
Athlete's Foot  Athlete's foot (tinea pedis) is a fungal infection of the skin on your feet. It often occurs on the skin that is between or underneath the toes. It can also occur on the soles of your feet. The infection can spread from person to person (is contagious). It can also spread when a person's bare feet come in contact with the fungus on shower floors or on items such as shoes. What are the causes? This condition is caused by a fungus that grows in warm, moist places. You can get athlete's foot by sharing shoes, shower stalls, towels, and wet floors with someone who is infected. Not washing your feet or changing your socks often enough can also lead to athlete's foot. What increases the risk? This condition is more likely to develop in:  Men.  People who have a weak body defense system (immune system).  People who have diabetes.  People who use public showers, such as at a gym.  People who wear heavy-duty shoes, such as industrial or military shoes.  Seasons with warm, humid weather. What are the signs or symptoms? Symptoms of this condition include:  Itchy areas between your toes or on the soles of your feet.  White, flaky, or scaly areas between your toes or on the soles of your feet.  Very itchy small blisters between your toes or on the soles of your feet.  Small cuts in your skin. These cuts can become infected.  Thick or discolored toenails. How is this diagnosed? This condition may be diagnosed with a physical exam and a review of your medical history. Your health care provider may also take a skin or toenail sample to examine under a microscope. How is this treated? This condition is treated with antifungal medicines. These may be applied as powders, ointments, or creams. In severe cases, an oral antifungal medicine may be given. Follow these instructions at home: Medicines  Apply or take over-the-counter and prescription medicines only as told by your health  care provider.  Apply your antifungal medicine as told by your health care provider. Do not stop using the antifungal even if your condition improves. Foot care  Do not scratch your feet.  Keep your feet dry: ? Wear cotton or wool socks. Change your socks every day or if they become wet. ? Wear shoes that allow air to flow, such as sandals or canvas tennis shoes.  Wash and dry your feet, including the area between your toes. Also, wash and dry your feet: ? Every day or as told by your health care provider. ? After exercising. General instructions  Do not let others use towels, shoes, nail clippers, or other personal items that touch your feet.  Protect your feet by wearing sandals in wet areas, such as locker rooms and shared showers.  Keep all follow-up visits as told by your health care provider. This is important.  If you have diabetes, keep your blood sugar under control. Contact a health care provider if:  You have a fever.  You have swelling, soreness, warmth, or redness in your foot.  Your feet are not getting better with treatment.  Your symptoms get worse.  You have new symptoms. Summary  Athlete's foot (tinea pedis) is a fungal infection of the skin on your feet. It often occurs on skin that is between or underneath the toes.  This condition is caused by a fungus that grows in warm, moist places.  Symptoms include white, flaky, or scaly areas between   your toes or on the soles of your feet.  This condition is treated with antifungal medicines.  Keep your feet clean. Always dry them thoroughly. This information is not intended to replace advice given to you by your health care provider. Make sure you discuss any questions you have with your health care provider. Document Released: 02/20/2000 Document Revised: 12/13/2016 Document Reviewed: 12/13/2016 Elsevier Interactive Patient Education  2019 [employer]    Diabetes Mellitus and Foot Care Foot care is  an important part of your health, especially when you have diabetes. Diabetes may cause you to have problems because of poor blood flow (circulation) to your feet and legs, which can cause your skin to:  Become thinner and drier.  Break more easily.  Heal more slowly.  Peel and crack. You may also have nerve damage (neuropathy) in your legs and feet, causing decreased feeling in them. This means that you may not notice minor injuries to your feet that could lead to more serious problems. Noticing and addressing any potential problems early is the best way to prevent future foot problems. How to care for your feet Foot hygiene  Wash your feet daily with warm water and mild soap. Do not use hot water. Then, pat your feet and the areas between your toes until they are completely dry. Do not soak your feet as this can dry your skin.  Trim your toenails straight across. Do not dig under them or around the cuticle. File the edges of your nails with an emery board or nail file.  Apply a moisturizing lotion or petroleum jelly to the skin on your feet and to dry, brittle toenails. Use lotion that does not contain alcohol and is unscented. Do not apply lotion between your toes. Shoes and socks  Wear clean socks or stockings every day. Make sure they are not too tight. Do not wear knee-high stockings since they may decrease blood flow to your legs.  Wear shoes that fit properly and have enough cushioning. Always look in your shoes before you put them on to be sure there are no objects inside.  To break in new shoes, wear them for just a few hours a day. This prevents injuries on your feet. Wounds, scrapes, corns, and calluses  Check your feet daily for blisters, cuts, bruises, sores, and redness. If you cannot see the bottom of your feet, use a mirror or ask someone for help.  Do not cut corns or calluses or try to remove them with medicine.  If you find a minor scrape, cut, or break in the skin  on your feet, keep it and the skin around it clean and dry. You may clean these areas with mild soap and water. Do not clean the area with peroxide, alcohol, or iodine.  If you have a wound, scrape, corn, or callus on your foot, look at it several times a day to make sure it is healing and not infected. Check for: ? Redness, swelling, or pain. ? Fluid or blood. ? Warmth. ? Pus or a bad smell. General instructions  Do not cross your legs. This may decrease blood flow to your feet.  Do not use heating pads or hot water bottles on your feet. They may burn your skin. If you have lost feeling in your feet or legs, you may not know this is happening until it is too late.  Protect your feet from hot and cold by wearing shoes, such as at the beach or on   hot pavement.  Schedule a complete foot exam at least once a year (annually) or more often if you have foot problems. If you have foot problems, report any cuts, sores, or bruises to your health care provider immediately. Contact a health care provider if:  You have a medical condition that increases your risk of infection and you have any cuts, sores, or bruises on your feet.  You have an injury that is not healing.  You have redness on your legs or feet.  You feel burning or tingling in your legs or feet.  You have pain or cramps in your legs and feet.  Your legs or feet are numb.  Your feet always feel cold.  You have pain around a toenail. Get help right away if:  You have a wound, scrape, corn, or callus on your foot and: ? You have pain, swelling, or redness that gets worse. ? You have fluid or blood coming from the wound, scrape, corn, or callus. ? Your wound, scrape, corn, or callus feels warm to the touch. ? You have pus or a bad smell coming from the wound, scrape, corn, or callus. ? You have a fever. ? You have a red line going up your leg. Summary  Check your feet every day for cuts, sores, red spots, swelling, and  blisters.  Moisturize feet and legs daily.  Wear shoes that fit properly and have enough cushioning.  If you have foot problems, report any cuts, sores, or bruises to your health care provider immediately.  Schedule a complete foot exam at least once a year (annually) or more often if you have foot problems. This information is not intended to replace advice given to you by your health care provider. Make sure you discuss any questions you have with your health care provider. Document Released: 02/20/2000 Document Revised: 04/06/2017 Document Reviewed: 03/26/2016 Elsevier Interactive Patient Education  2019 Elsevier Inc.  Onychomycosis/Fungal Toenails  WHAT IS IT? An infection that lies within the keratin of your nail plate that is caused by a fungus.  WHY ME? Fungal infections affect all ages, sexes, races, and creeds.  There may be many factors that predispose you to a fungal infection such as age, coexisting medical conditions such as diabetes, or an autoimmune disease; stress, medications, fatigue, genetics, etc.  Bottom line: fungus thrives in a warm, moist environment and your shoes offer such a location.  IS IT CONTAGIOUS? Theoretically, yes.  You do not want to share shoes, nail clippers or files with someone who has fungal toenails.  Walking around barefoot in the same room or sleeping in the same bed is unlikely to transfer the organism.  It is important to realize, however, that fungus can spread easily from one nail to the next on the same foot.  HOW DO WE TREAT THIS?  There are several ways to treat this condition.  Treatment may depend on many factors such as age, medications, pregnancy, liver and kidney conditions, etc.  It is best to ask your doctor which options are available to you.  1. No treatment.   Unlike many other medical concerns, you can live with this condition.  However for many people this can be a painful condition and may lead to ingrown toenails or a bacterial  infection.  It is recommended that you keep the nails cut short to help reduce the amount of fungal nail. 2. Topical treatment.  These range from herbal remedies to prescription strength nail lacquers.  About 40-50%   effective, topicals require twice daily application for approximately 9 to 12 months or until an entirely new nail has grown out.  The most effective topicals are medical grade medications available through physicians offices. 3. Oral antifungal medications.  With an 80-90% cure rate, the most common oral medication requires 3 to 4 months of therapy and stays in your system for a year as the new nail grows out.  Oral antifungal medications do require blood work to make sure it is a safe drug for you.  A liver function panel will be performed prior to starting the medication and after the first month of treatment.  It is important to have the blood work performed to avoid any harmful side effects.  In general, this medication safe but blood work is required. 4. Laser Therapy.  This treatment is performed by applying a specialized laser to the affected nail plate.  This therapy is noninvasive, fast, and non-painful.  It is not covered by insurance and is therefore, out of pocket.  The results have been very good with a 80-95% cure rate.  [facility] is the only practice in the area to offer this therapy. 5. Permanent Nail Avulsion.  Removing the entire nail so that a new nail will not grow back. 

## 2018-08-28 DIAGNOSIS — N184 Chronic kidney disease, stage 4 (severe): Secondary | ICD-10-CM | POA: Diagnosis not present

## 2018-08-28 DIAGNOSIS — R5381 Other malaise: Secondary | ICD-10-CM | POA: Diagnosis not present

## 2018-08-31 ENCOUNTER — Telehealth (HOSPITAL_COMMUNITY): Payer: Self-pay | Admitting: Rehabilitation

## 2018-08-31 NOTE — Telephone Encounter (Signed)

## 2018-08-31 NOTE — Progress Notes (Signed)
Subjective:  Erin Liu presents to clinic today with cc of  painful, thick, discolored, elongated toenails 1-5 b/l that become tender and cannot cut because of thickness.  Pain is aggravated when wearing enclosed shoe gear.  Binnie Rail, MD is her PCP. Last visit was 07/24/2018.   Allergies  Allergen Reactions  . Lisinopril Cough     Objective: Vitals:   08/21/18 1330  Temp: (!) 97.3 F (36.3 C)    Physical Examination:  Vascular Examination: Capillary refill time immediate x 10 digits.  Palpable DP/PT pulses b/l.  Digital hair absent b/l.  No edema noted b/l.  Skin temperature gradient WNL b/l.  Dermatological Examination: Skin with normal turgor, texture and tone b/l.  No open wounds b/l.  No interdigital macerations noted b/l.  Diffuse scaling noted peripherally and plantarly b/l feet with mild foot odor.  No interdigital macerations.  No blisters, no weeping. No signs of secondary bacterial infection noted.  Elongated, thick, discolored brittle toenails with subungual debris and pain on dorsal palpation of nailbeds 1-5 b/l.  Musculoskeletal Examination: Muscle strength 5/5 to all muscle groups b/l  No pain, crepitus or joint discomfort with active/passive ROM.  Neurological Examination: Sensation intact 5/5 b/l with 10 gram monofilament.  Vibratory sensation diminished.   Assessment: Mycotic nail infection with pain 1-5 b/l Tinea pedis b/l  Plan: 1. Toenails 1-5 b/l were debrided in length and girth without iatrogenic laceration. 2. Discussed tinea pedis and treatment options. Prescription written for ciclopirox cream 0.77%.  Patient is to apply to both feet and between toes twice daily for 4 weeks. 3. Continue soft, supportive shoe gear daily. 3.  Report any pedal injuries to medical professional. 4.  Follow up 3 months. 5.  Patient/POA to call should there be a question/concern in there interim.

## 2018-09-01 ENCOUNTER — Ambulatory Visit (INDEPENDENT_AMBULATORY_CARE_PROVIDER_SITE_OTHER): Payer: Self-pay | Admitting: Physician Assistant

## 2018-09-01 ENCOUNTER — Other Ambulatory Visit: Payer: Self-pay

## 2018-09-01 VITALS — BP 140/78 | HR 115 | Temp 98.1°F | Resp 12 | Ht 67.0 in | Wt 187.4 lb

## 2018-09-01 DIAGNOSIS — N184 Chronic kidney disease, stage 4 (severe): Secondary | ICD-10-CM

## 2018-09-01 NOTE — Progress Notes (Signed)
    Postoperative Access Visit   History of Present Illness   Erin Liu is a 78 y.o. year old female who presents for postoperative follow-up for: left second stage basilic vein transposition by Dr. Donzetta Matters (Date: 08/03/18).  The patient's wounds are healed.  The patient denies steal symptoms.  The patient is able to complete their activities of daily living.  She is not yet on hemodialysis.   Physical Examination   Vitals:   09/01/18 1333  BP: 140/78  Pulse: (!) 115  Resp: 12  Temp: 98.1 F (36.7 C)  TempSrc: Temporal  SpO2: 98%  Weight: 187 lb 6.4 oz (85 kg)  Height: 5\' 7"  (1.702 m)   Body mass index is 29.35 kg/m.  left arm Incisions are healed, hand grip is 5/5, sensation in digits is intact, palpable thrill, bruit can be auscultated, palpable L radial pulse     Medical Decision Making   Erin Liu is a 78 y.o. year old female who presents s/p left second stage basilic vein transposition   Patent transposed fistula without signs or symptoms of steal syndrome  The patient's access is ready for use  The patient may follow up on a prn basis   Dagoberto Ligas PA-C [facility] [place] Office: [phone]  Clinic MD: Dr. Donzetta Matters

## 2018-09-06 ENCOUNTER — Other Ambulatory Visit: Payer: Self-pay | Admitting: Internal Medicine

## 2018-09-06 DIAGNOSIS — IMO0002 Reserved for concepts with insufficient information to code with codable children: Secondary | ICD-10-CM

## 2018-09-06 DIAGNOSIS — E1329 Other specified diabetes mellitus with other diabetic kidney complication: Secondary | ICD-10-CM

## 2018-09-27 DIAGNOSIS — R5381 Other malaise: Secondary | ICD-10-CM | POA: Diagnosis not present

## 2018-09-27 DIAGNOSIS — N184 Chronic kidney disease, stage 4 (severe): Secondary | ICD-10-CM | POA: Diagnosis not present

## 2018-10-24 DIAGNOSIS — N184 Chronic kidney disease, stage 4 (severe): Secondary | ICD-10-CM | POA: Diagnosis not present

## 2018-10-26 DIAGNOSIS — N2581 Secondary hyperparathyroidism of renal origin: Secondary | ICD-10-CM | POA: Diagnosis not present

## 2018-10-26 DIAGNOSIS — D631 Anemia in chronic kidney disease: Secondary | ICD-10-CM | POA: Diagnosis not present

## 2018-10-26 DIAGNOSIS — N184 Chronic kidney disease, stage 4 (severe): Secondary | ICD-10-CM | POA: Diagnosis not present

## 2018-10-26 DIAGNOSIS — I129 Hypertensive chronic kidney disease with stage 1 through stage 4 chronic kidney disease, or unspecified chronic kidney disease: Secondary | ICD-10-CM | POA: Diagnosis not present

## 2018-10-28 DIAGNOSIS — N184 Chronic kidney disease, stage 4 (severe): Secondary | ICD-10-CM | POA: Diagnosis not present

## 2018-10-28 DIAGNOSIS — R5381 Other malaise: Secondary | ICD-10-CM | POA: Diagnosis not present

## 2018-11-16 ENCOUNTER — Other Ambulatory Visit: Payer: Self-pay | Admitting: Internal Medicine

## 2018-11-27 ENCOUNTER — Encounter: Payer: Self-pay | Admitting: Podiatry

## 2018-11-27 ENCOUNTER — Ambulatory Visit (INDEPENDENT_AMBULATORY_CARE_PROVIDER_SITE_OTHER): Payer: Medicare HMO | Admitting: Podiatry

## 2018-11-27 ENCOUNTER — Other Ambulatory Visit: Payer: Self-pay

## 2018-11-27 DIAGNOSIS — M79675 Pain in left toe(s): Secondary | ICD-10-CM | POA: Diagnosis not present

## 2018-11-27 DIAGNOSIS — B351 Tinea unguium: Secondary | ICD-10-CM | POA: Diagnosis not present

## 2018-11-27 DIAGNOSIS — M79674 Pain in right toe(s): Secondary | ICD-10-CM

## 2018-11-27 LAB — HM DIABETES FOOT EXAM

## 2018-11-27 NOTE — Patient Instructions (Signed)
Diabetes Mellitus and Foot Care Foot care is an important part of your health, especially when you have diabetes. Diabetes may cause you to have problems because of poor blood flow (circulation) to your feet and legs, which can cause your skin to:  Become thinner and drier.  Break more easily.  Heal more slowly.  Peel and crack. You may also have nerve damage (neuropathy) in your legs and feet, causing decreased feeling in them. This means that you may not notice minor injuries to your feet that could lead to more serious problems. Noticing and addressing any potential problems early is the best way to prevent future foot problems. How to care for your feet Foot hygiene  Wash your feet daily with warm water and mild soap. Do not use hot water. Then, pat your feet and the areas between your toes until they are completely dry. Do not soak your feet as this can dry your skin.  Trim your toenails straight across. Do not dig under them or around the cuticle. File the edges of your nails with an emery board or nail file.  Apply a moisturizing lotion or petroleum jelly to the skin on your feet and to dry, brittle toenails. Use lotion that does not contain alcohol and is unscented. Do not apply lotion between your toes. Shoes and socks  Wear clean socks or stockings every day. Make sure they are not too tight. Do not wear knee-high stockings since they may decrease blood flow to your legs.  Wear shoes that fit properly and have enough cushioning. Always look in your shoes before you put them on to be sure there are no objects inside.  To break in new shoes, wear them for just a few hours a day. This prevents injuries on your feet. Wounds, scrapes, corns, and calluses  Check your feet daily for blisters, cuts, bruises, sores, and redness. If you cannot see the bottom of your feet, use a mirror or ask someone for help.  Do not cut corns or calluses or try to remove them with medicine.  If you  find a minor scrape, cut, or break in the skin on your feet, keep it and the skin around it clean and dry. You may clean these areas with mild soap and water. Do not clean the area with peroxide, alcohol, or iodine.  If you have a wound, scrape, corn, or callus on your foot, look at it several times a day to make sure it is healing and not infected. Check for: ? Redness, swelling, or pain. ? Fluid or blood. ? Warmth. ? Pus or a bad smell. General instructions  Do not cross your legs. This may decrease blood flow to your feet.  Do not use heating pads or hot water bottles on your feet. They may burn your skin. If you have lost feeling in your feet or legs, you may not know this is happening until it is too late.  Protect your feet from hot and cold by wearing shoes, such as at the beach or on hot pavement.  Schedule a complete foot exam at least once a year (annually) or more often if you have foot problems. If you have foot problems, report any cuts, sores, or bruises to your health care provider immediately. Contact a health care provider if:  You have a medical condition that increases your risk of infection and you have any cuts, sores, or bruises on your feet.  You have an injury that is not   healing.  You have redness on your legs or feet.  You feel burning or tingling in your legs or feet.  You have pain or cramps in your legs and feet.  Your legs or feet are numb.  Your feet always feel cold.  You have pain around a toenail. Get help right away if:  You have a wound, scrape, corn, or callus on your foot and: ? You have pain, swelling, or redness that gets worse. ? You have fluid or blood coming from the wound, scrape, corn, or callus. ? Your wound, scrape, corn, or callus feels warm to the touch. ? You have pus or a bad smell coming from the wound, scrape, corn, or callus. ? You have a fever. ? You have a red line going up your leg. Summary  Check your feet every day  for cuts, sores, red spots, swelling, and blisters.  Moisturize feet and legs daily.  Wear shoes that fit properly and have enough cushioning.  If you have foot problems, report any cuts, sores, or bruises to your health care provider immediately.  Schedule a complete foot exam at least once a year (annually) or more often if you have foot problems. This information is not intended to replace advice given to you by your health care provider. Make sure you discuss any questions you have with your health care provider. Document Released: 02/20/2000 Document Revised: 04/06/2017 Document Reviewed: 03/26/2016 Elsevier Patient Education  2020 [employer]   Onychomycosis/Fungal Toenails  WHAT IS IT? An infection that lies within the keratin of your nail plate that is caused by a fungus.  WHY ME? Fungal infections affect all ages, sexes, races, and creeds.  There may be many factors that predispose you to a fungal infection such as age, coexisting medical conditions such as diabetes, or an autoimmune disease; stress, medications, fatigue, genetics, etc.  Bottom line: fungus thrives in a warm, moist environment and your shoes offer such a location.  IS IT CONTAGIOUS? Theoretically, yes.  You do not want to share shoes, nail clippers or files with someone who has fungal toenails.  Walking around barefoot in the same room or sleeping in the same bed is unlikely to transfer the organism.  It is important to realize, however, that fungus can spread easily from one nail to the next on the same foot.  HOW DO WE TREAT THIS?  There are several ways to treat this condition.  Treatment may depend on many factors such as age, medications, pregnancy, liver and kidney conditions, etc.  It is best to ask your doctor which options are available to you.  1. No treatment.   Unlike many other medical concerns, you can live with this condition.  However for many people this can be a painful condition and may lead to  ingrown toenails or a bacterial infection.  It is recommended that you keep the nails cut short to help reduce the amount of fungal nail. 2. Topical treatment.  These range from herbal remedies to prescription strength nail lacquers.  About 40-50% effective, topicals require twice daily application for approximately 9 to 12 months or until an entirely new nail has grown out.  The most effective topicals are medical grade medications available through physicians offices. 3. Oral antifungal medications.  With an 80-90% cure rate, the most common oral medication requires 3 to 4 months of therapy and stays in your system for a year as the new nail grows out.  Oral antifungal medications do require   blood work to make sure it is a safe drug for you.  A liver function panel will be performed prior to starting the medication and after the first month of treatment.  It is important to have the blood work performed to avoid any harmful side effects.  In general, this medication safe but blood work is required. 4. Laser Therapy.  This treatment is performed by applying a specialized laser to the affected nail plate.  This therapy is noninvasive, fast, and non-painful.  It is not covered by insurance and is therefore, out of pocket.  The results have been very good with a 80-95% cure rate.  The [facility] is the only practice in the area to offer this therapy. 5. Permanent Nail Avulsion.  Removing the entire nail so that a new nail will not grow back. 

## 2018-11-27 NOTE — Progress Notes (Signed)
Subjective: Erin Liu is seen today for follow up painful, elongated, thickened toenails 1-5 b/l feet that she cannot cut. Pain interferes with daily activities. Aggravating factor includes wearing enclosed shoe gear and relieved with periodic debridement.  Daughter is present during the visit. They deny any new pedal concerns on today's visit.  Objective:  Vascular Examination: Capillary refill time immediate x 10 digits.  Dorsalis pedis present b/l.  Posterior tibial pulses present b/l.  Digital hair absent x 10 digits.  Skin temperature gradient WNL b/l.   Trace edema noted dorsum of feet and b/l ankles. No open woounds. No increased warmth.   Dermatological Examination: Skin with normal turgor, texture and tone b/l.  Toenails 1-5 b/l discolored, thick, dystrophic with subungual debris and pain with palpation to nailbeds due to thickness of nails.  Resolved scaling noted b/l LE.   Musculoskeletal: Muscle strength 5/5 to all LE muscle groups b/l.  No gross bony deformities b/l.  No pain, crepitus or joint limitation noted with ROM.   Neurological Examination: Protective sensation intact with 10 gram monofilament bilaterally.  Epicritic sensation present bilaterally.  Vibratory sensation intact bilaterally.   Assessment: Painful onychomycosis toenails 1-5 b/l  Resolved tinea pedis b/l  Plan: 1. Toenails 1-5 b/l were debrided in length and girth without iatrogenic bleeding. 2. Patient to continue soft, supportive shoe gear 3. Patient to report any pedal injuries to medical professional immediately. 4. Follow up 3 months.  5. Patient/POA to call should there be a concern in the interim.

## 2018-11-28 DIAGNOSIS — N184 Chronic kidney disease, stage 4 (severe): Secondary | ICD-10-CM | POA: Diagnosis not present

## 2018-11-28 DIAGNOSIS — R5381 Other malaise: Secondary | ICD-10-CM | POA: Diagnosis not present

## 2018-12-01 ENCOUNTER — Encounter: Payer: Self-pay | Admitting: Internal Medicine

## 2018-12-28 DIAGNOSIS — R5381 Other malaise: Secondary | ICD-10-CM | POA: Diagnosis not present

## 2018-12-28 DIAGNOSIS — N184 Chronic kidney disease, stage 4 (severe): Secondary | ICD-10-CM | POA: Diagnosis not present

## 2019-01-28 DIAGNOSIS — N184 Chronic kidney disease, stage 4 (severe): Secondary | ICD-10-CM | POA: Diagnosis not present

## 2019-01-28 DIAGNOSIS — R5381 Other malaise: Secondary | ICD-10-CM | POA: Diagnosis not present

## 2019-01-29 DIAGNOSIS — D631 Anemia in chronic kidney disease: Secondary | ICD-10-CM | POA: Diagnosis not present

## 2019-01-29 DIAGNOSIS — N2581 Secondary hyperparathyroidism of renal origin: Secondary | ICD-10-CM | POA: Diagnosis not present

## 2019-01-29 DIAGNOSIS — Z23 Encounter for immunization: Secondary | ICD-10-CM | POA: Diagnosis not present

## 2019-01-29 DIAGNOSIS — I129 Hypertensive chronic kidney disease with stage 1 through stage 4 chronic kidney disease, or unspecified chronic kidney disease: Secondary | ICD-10-CM | POA: Diagnosis not present

## 2019-01-29 DIAGNOSIS — N189 Chronic kidney disease, unspecified: Secondary | ICD-10-CM | POA: Diagnosis not present

## 2019-01-29 DIAGNOSIS — N184 Chronic kidney disease, stage 4 (severe): Secondary | ICD-10-CM | POA: Diagnosis not present

## 2019-02-14 ENCOUNTER — Other Ambulatory Visit: Payer: Self-pay | Admitting: Internal Medicine

## 2019-03-20 ENCOUNTER — Ambulatory Visit: Payer: Self-pay | Admitting: *Deleted

## 2019-03-20 NOTE — Telephone Encounter (Signed)
Per initial encounter, "Pt daughter Desmond Lope stated she needs to speak with a nurse regarding all the coughing her mother is doing. [name] stated the coughing is off and on but it is happening way too often and she would like to speak with a nurse. [name] requests call back"; Contacted pt's daughter due to her concerns; she says the pt has intermittent cough for a "couple of months"; she is having a dry cough with no cold symptoms; the pt's daughter has given her cough syrup (Robutussin, and  Mucinex; the pt has drinking honey and lemon; recommendations made per nurse triage protocol; the pt sees Dr Quay Burow, [facility]; pt's daughter transferred to [facility] for scheduling.  Reason for Disposition . Cough lasts > 3 weeks  Answer Assessment - Initial Assessment Questions 1. ONSET: "When did the cough begin?"      Before Thanksgiving 2020 2. SEVERITY: "How bad is the cough today?"      mild 3. RESPIRATORY DISTRESS: "Describe your breathing."       4. FEVER: "Do you have a fever?" If so, ask: "What is your temperature, how was it measured, and when did it start?"      5. SPUTUM: "Describe the color of your sputum" (e.g., clear, white, yellow, green), "Has there been any change recently?"     n/a 6. HEMOPTYSIS: "Are you coughing up any blood?" If so ask: "How much, flecks, streaks, tablespoons, etc.?"    no 7. CARDIAC HISTORY: "Do you have any history of heart disease?" (e.g., heart attack, congestive heart failure)     Hx HTN no medication 8. LUNG HISTORY: "Do you have any history of lung disease?"  (e.g., pulmonary embolus, asthma, emphysema/COPD)    no 9. OTHER SYMPTOMS: "Do you have any other symptoms? (e.g., runny nose, wheezing, chest pain)     no 10. PREGNANCY: "Is there any chance you are pregnant?" "When was your last menstrual period?"       no 11. TRAVEL: "Have you traveled out of the country in the last month?" (e.g., travel history, exposures)       no  Protocols used:  COUGH - CHRONIC-A-AH

## 2019-03-20 NOTE — Telephone Encounter (Signed)
FYI doing doxy with Mickel Baas tomorrow at 11:00.

## 2019-03-21 ENCOUNTER — Ambulatory Visit (INDEPENDENT_AMBULATORY_CARE_PROVIDER_SITE_OTHER): Payer: Medicare HMO | Admitting: Family

## 2019-03-21 ENCOUNTER — Encounter: Payer: Self-pay | Admitting: Family

## 2019-03-21 DIAGNOSIS — R05 Cough: Secondary | ICD-10-CM | POA: Diagnosis not present

## 2019-03-21 DIAGNOSIS — R059 Cough, unspecified: Secondary | ICD-10-CM

## 2019-03-21 MED ORDER — AZITHROMYCIN 250 MG PO TABS: ORAL_TABLET | ORAL | 0 refills | Status: DC

## 2019-03-21 MED ORDER — BENZONATATE 100 MG PO CAPS: 100.0000 mg | ORAL_CAPSULE | Freq: Three times a day (TID) | ORAL | 0 refills | Status: DC | PRN

## 2019-03-21 NOTE — Progress Notes (Signed)
Erin Liu is a 79 y.o. female with the following history as recorded in EpicCare:  Patient Active Problem List   Diagnosis Date Noted  . Hand tingling 07/24/2018  . Physical deconditioning 01/13/2018  . Hypokalemia 01/12/2018  . Tremor of both hands 10/10/2017  . Urinary incontinence 10/10/2017  . Vasovagal syncope 10/02/2017  . 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 B12 deficiency 04/11/2017  . Left flank pain 08/30/2015  . Cough 08/26/2015  . Lumbago 02/25/2015  . Essential tremor   . Glaucoma 01/08/2010  . Chronic kidney disease (CKD), stage IV (severe) ([place]) 12/10/2008  . DM (diabetes mellitus), secondary, uncontrolled, with renal complications ([place]) 78/29/5621  . Diabetic neuropathy ([place]) 02/22/2006  . Dyslipidemia 02/22/2006  . 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 CARPAL TUNNEL SYNDROME, BILATERAL 02/22/2006  . Essential hypertension 02/22/2006  . CONSTIPATION 02/22/2006    Current Outpatient Medications  Medication Sig Dispense Refill  . acetaminophen (TYLENOL) 325 MG tablet Take 2 tablets (650 mg total) by mouth every 6 (six) hours as needed for mild pain.    Marland Kitchen allopurinol (ZYLOPRIM) 100 MG tablet TAKE 2 TABLETS(200 MG) BY MOUTH DAILY 180 tablet 0  . atorvastatin (LIPITOR) 40 MG tablet TAKE 1 TABLET BY MOUTH DAILY 90 tablet 0  . azithromycin (ZITHROMAX) 250 MG tablet 2 tabs po qd x 1 day; 1 tablet per day x 4 days; 6 tablet 0  . benzonatate (TESSALON) 100 MG capsule Take 1 capsule (100 mg total) by mouth 3 (three) times daily as needed. 20 capsule 0  . ciclopirox (LOPROX) 0.77 % cream Apply to both feet and between toes bid x 4 weeks. 30 g 1  . Cyanocobalamin (VITAMIN B-12) 2000 MCG TBCR Take 1 tablet (2,000 mcg total) by mouth daily. 90 tablet 1  . furosemide (LASIX) 20 MG tablet Take 20 mg by mouth daily.    Marland Kitchen latanoprost (XALATAN) 0.005 % ophthalmic solution Place 1 drop into both eyes at bedtime.  3  . Vitamin D, Ergocalciferol, (DRISDOL) 1.25 MG  (50000 UT) CAPS capsule Take 50,000 Units by mouth every 7 (seven) days.     No current facility-administered medications for this visit.    Allergies: Lisinopril  Past Medical History:  Diagnosis Date  . ANEMIA, NORMOCYTIC   . Arthritis    back  . BREAST CYST, HX OF   . CARPAL TUNNEL SYNDROME, BILATERAL   . DIABETES MELLITUS, TYPE II   . Glaucoma   . HYPERLIPIDEMIA   . HYPERTENSION   . OBESITY   . PERIPHERAL NEUROPATHY   . RENAL FAILURE, CHRONIC    Stage 4    Past Surgical History:  Procedure Laterality Date  . AV FISTULA PLACEMENT Left 05/23/2018   Procedure: ARTERIOVENOUS (AV) FISTULA CREATION LEFT ARM;  Surgeon: Waynetta Sandy, MD;  Location: [facility];  Service: Vascular;  Laterality: Left;  . BASCILIC VEIN TRANSPOSITION Left 08/03/2018   Procedure: SECOND STAGE BASILIC VEIN TRANSPOSITION LEFT ARM;  Surgeon: Waynetta Sandy, MD;  Location: [facility];  Service: Vascular;  Laterality: Left;  . CATARACT EXTRACTION BILATERAL W/ ANTERIOR VITRECTOMY  05/2010  . COLONOSCOPY     [facility]/7-8 years    Family History  Problem Relation Age of Onset  . Arthritis Son   . Alcohol abuse Son   . Diabetes Son   . Hypertension Son   . Colon cancer Son 41  . Heart attack Mother        Died age 1s  .  Heart attack Father        No details 36s  . Heart disease Sister        Slight heart attack, died age 62    Social History   Tobacco Use  . Smoking status: Never Smoker  . Smokeless tobacco: Never Used  Substance Use Topics  . Alcohol use: No    Subjective:   I connected with Erin Liu on 03/21/19 at 11:00 AM EST by a video enabled telemedicine application and verified that I am speaking with the correct person using two identifiers.   I discussed the limitations of evaluation and management by telemedicine and the availability of in person appointments. The patient expressed understanding and agreed to proceed. Provider in office/ patient is at home; provider  and patient, patient's son and daughter are all present on video call.   Patient has had a cough x 1 week; no fever or shortness of breath; per family members, there is no concern for COVID exposure; using OTC Robitussin with limited relief; daughter asks for a refill on the Z-pak given when mother had similar symptoms in 03/2018.      Objective:  There were no vitals filed for this visit.  General: Well developed, well nourished, in no acute distress  Lungs: Respirations unlabored;  Neurologic: Alert and oriented; speech intact; face symmetrical;   Assessment:  1. Cough     Plan:  Patient is well appearing on video visit; patient and family defer getting COVID test at this time; Rx for Z-pak, Tessalon Perles; increase fluids, rest and follow-up worse, no better.   No follow-ups on file.  No orders of the defined types were placed in this encounter.   Requested Prescriptions   Signed Prescriptions Disp Refills  . azithromycin (ZITHROMAX) 250 MG tablet 6 tablet 0    Sig: 2 tabs po qd x 1 day; 1 tablet per day x 4 days;  . benzonatate (TESSALON) 100 MG capsule 20 capsule 0    Sig: Take 1 capsule (100 mg total) by mouth 3 (three) times daily as needed.

## 2019-04-18 DIAGNOSIS — N184 Chronic kidney disease, stage 4 (severe): Secondary | ICD-10-CM | POA: Diagnosis not present

## 2019-04-23 DIAGNOSIS — I129 Hypertensive chronic kidney disease with stage 1 through stage 4 chronic kidney disease, or unspecified chronic kidney disease: Secondary | ICD-10-CM | POA: Diagnosis not present

## 2019-04-23 DIAGNOSIS — D631 Anemia in chronic kidney disease: Secondary | ICD-10-CM | POA: Diagnosis not present

## 2019-04-23 DIAGNOSIS — N184 Chronic kidney disease, stage 4 (severe): Secondary | ICD-10-CM | POA: Diagnosis not present

## 2019-04-23 DIAGNOSIS — N2581 Secondary hyperparathyroidism of renal origin: Secondary | ICD-10-CM | POA: Diagnosis not present

## 2019-05-15 ENCOUNTER — Other Ambulatory Visit: Payer: Self-pay | Admitting: Internal Medicine

## 2019-05-31 ENCOUNTER — Ambulatory Visit: Payer: Medicare HMO | Attending: Internal Medicine

## 2019-05-31 DIAGNOSIS — Z23 Encounter for immunization: Secondary | ICD-10-CM

## 2019-05-31 NOTE — Progress Notes (Signed)
   Covid-19 Vaccination Clinic  Name:  KAYA POTTENGER    MRN: [id number] DOB: [birth year]  05/31/2019  Ms. Jilek was observed post Covid-19 immunization for 15 minutes without incident. She was provided with Vaccine Information Sheet and instruction to access the V-Safe system.   Ms. Halvorsen was instructed to call 911 with any severe reactions post vaccine: Marland Kitchen Difficulty breathing  . Swelling of face and throat  . A fast heartbeat  . A bad rash all over body  . Dizziness and weakness   Immunizations Administered    Name Date Dose VIS Date Route   Pfizer COVID-19 Vaccine 05/31/2019 12:54 PM 0.3 mL 02/16/2019 Intramuscular   Manufacturer: [place]   Lot: [id number]   [place]: [address]-3     7

## 2019-06-25 ENCOUNTER — Ambulatory Visit: Payer: Medicare HMO | Attending: Internal Medicine

## 2019-06-25 DIAGNOSIS — Z23 Encounter for immunization: Secondary | ICD-10-CM

## 2019-06-25 NOTE — Progress Notes (Signed)
   Covid-19 Vaccination Clinic  Name:  Erin Liu    MRN: [id number] DOB: 12/19/1940  06/25/2019  [name] was observed post Covid-19 immunization for 15 minutes without incident. She was provided with Vaccine Information Sheet and instruction to access the V-Safe system.   [name] was instructed to call 911 with any severe reactions post vaccine: Marland Kitchen Difficulty breathing  . Swelling of face and throat  . A fast heartbeat  . A bad rash all over body  . Dizziness and weakness   Immunizations Administered    Name Date Dose VIS Date Route   Pfizer COVID-19 Vaccine 06/25/2019 12:28 PM 0.3 mL 05/02/2018 Intramuscular   Manufacturer: [place]   Lot: [id number]   [place]: [id number]

## 2019-07-17 DIAGNOSIS — H401130 Primary open-angle glaucoma, bilateral, stage unspecified: Secondary | ICD-10-CM | POA: Diagnosis not present

## 2019-07-17 DIAGNOSIS — H35033 Hypertensive retinopathy, bilateral: Secondary | ICD-10-CM | POA: Diagnosis not present

## 2019-07-17 DIAGNOSIS — Z961 Presence of intraocular lens: Secondary | ICD-10-CM | POA: Diagnosis not present

## 2019-07-17 DIAGNOSIS — H401131 Primary open-angle glaucoma, bilateral, mild stage: Secondary | ICD-10-CM | POA: Diagnosis not present

## 2019-07-17 NOTE — Progress Notes (Signed)
Subjective:    Patient ID: Erin Liu, female    DOB: [birth year], 79 y.o.   MRN: [id number]  HPI She is here for a physical exam. She is here with her daughter.   Memory concerns.  She tends forget more things.  Her daughter is not sure if it is normal aging or not.  Her last MMSE here in 2020 was 25/30  intermittent cough.  It is not related to being out side or eating.  It is primarily dry.   Medications and allergies reviewed with patient and updated if appropriate.  Patient Active Problem List   Diagnosis Date Noted  . Rash 07/18/2019  . Hand tingling 07/24/2018  . Physical deconditioning 01/13/2018  . Hypokalemia 01/12/2018  . Tremor of both hands 10/10/2017  . Urinary incontinence 10/10/2017  . Vasovagal syncope 10/02/2017  . 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 B12 deficiency 04/11/2017  . Left flank pain 08/30/2015  . Cough 08/26/2015  . Lumbago 02/25/2015  . Essential tremor   . Glaucoma 01/08/2010  . Chronic kidney disease (CKD), stage IV (severe) ([place]) 12/10/2008  . DM (diabetes mellitus), secondary, uncontrolled, with renal complications ([place]) 01/60/1093  . Diabetic neuropathy ([place]) 02/22/2006  . Dyslipidemia 02/22/2006  . 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 CARPAL TUNNEL SYNDROME, BILATERAL 02/22/2006  . CONSTIPATION 02/22/2006    Current Outpatient Medications on File Prior to Visit  Medication Sig Dispense Refill  . acetaminophen (TYLENOL) 325 MG tablet Take 2 tablets (650 mg total) by mouth every 6 (six) hours as needed for mild pain.    Marland Kitchen allopurinol (ZYLOPRIM) 100 MG tablet Take 2 tablets (200 mg total) by mouth daily. Annual appt due in MAY must see provider for future refills 180 tablet 0  . atorvastatin (LIPITOR) 40 MG tablet Take 1 tablet by mouth daily. Annual appt due in MAY must see provider for future refills 90 tablet 0  . ciclopirox (LOPROX) 0.77 % cream Apply to both feet and between toes bid x 4 weeks. 30 g 1  .  Cyanocobalamin (VITAMIN B-12) 2000 MCG TBCR Take 1 tablet (2,000 mcg total) by mouth daily. 90 tablet 1  . furosemide (LASIX) 20 MG tablet Take 20 mg by mouth daily.    Marland Kitchen latanoprost (XALATAN) 0.005 % ophthalmic solution Place 1 drop into both eyes at bedtime.  3   No current facility-administered medications on file prior to visit.    Past Medical History:  Diagnosis Date  . ANEMIA, NORMOCYTIC   . Arthritis    back  . BREAST CYST, HX OF   . CARPAL TUNNEL SYNDROME, BILATERAL   . DIABETES MELLITUS, TYPE II   . Glaucoma   . HYPERLIPIDEMIA   . HYPERTENSION   . OBESITY   . PERIPHERAL NEUROPATHY   . RENAL FAILURE, CHRONIC    Stage 4    Past Surgical History:  Procedure Laterality Date  . AV FISTULA PLACEMENT Left 05/23/2018   Procedure: ARTERIOVENOUS (AV) FISTULA CREATION LEFT ARM;  Surgeon: Waynetta Sandy, MD;  Location: [facility];  Service: Vascular;  Laterality: Left;  . BASCILIC VEIN TRANSPOSITION Left 08/03/2018   Procedure: SECOND STAGE BASILIC VEIN TRANSPOSITION LEFT ARM;  Surgeon: Waynetta Sandy, MD;  Location: [facility];  Service: Vascular;  Laterality: Left;  . CATARACT EXTRACTION BILATERAL W/ ANTERIOR VITRECTOMY  05/2010  . COLONOSCOPY     [facility]/7-8 years    Social History   Socioeconomic History  . Marital status: Widowed  Spouse name: Not on file  . Number of children: 3  . Years of education: Not on file  . Highest education level: Not on file  Occupational History  . Not on file  Tobacco Use  . Smoking status: Never Smoker  . Smokeless tobacco: Never Used  Substance and Sexual Activity  . Alcohol use: No  . Drug use: No  . Sexual activity: Not Currently  Other Topics Concern  . Not on file  Social History Narrative   Widowed, lives with son. Retired Medical illustrator   Social Determinants of Radio broadcast assistant Strain:   . Difficulty of Paying Living Expenses:   Food Insecurity:   . Worried About Charity fundraiser in the Last  Year:   . Arboriculturist in the Last Year:   Transportation Needs:   . Film/video editor (Medical):   Marland Kitchen Lack of Transportation (Non-Medical):   Physical Activity:   . Days of Exercise per Week:   . Minutes of Exercise per Session:   Stress:   . Feeling of Stress :   Social Connections:   . Frequency of Communication with Friends and Family:   . Frequency of Social Gatherings with Friends and Family:   . Attends Religious Services:   . Active Member of Clubs or Organizations:   . Attends Archivist Meetings:   Marland Kitchen Marital Status:     Family History  Problem Relation Age of Onset  . Arthritis Son   . Alcohol abuse Son   . Diabetes Son   . Hypertension Son   . Colon cancer Son 57  . Heart attack Mother        Died age 64s  . Heart attack Father        No details 82s  . Heart disease Sister        Slight heart attack, died age 29    Review of Systems  Constitutional: Negative for appetite change, chills and fever.  HENT: Positive for postnasal drip and rhinorrhea.   Eyes: Negative for visual disturbance.  Respiratory: Positive for cough (occ productive, mostly dry), shortness of breath (sometimes) and wheezing (sometimes).   Cardiovascular: Positive for leg swelling (mild). Negative for chest pain and palpitations.  Gastrointestinal: Negative for abdominal pain, blood in stool, constipation, diarrhea and nausea.       No gerd  Genitourinary: Negative for dysuria and hematuria.  Musculoskeletal: Positive for back pain (arthritis). Negative for arthralgias.  Skin: Positive for rash (b/l upper chest - no itch).  Neurological: Negative for dizziness, light-headedness and headaches.  Psychiatric/Behavioral: Negative for dysphoric mood. The patient is not nervous/anxious.        Objective:   Vitals:   07/18/19 1028  BP: (!) 150/84  Pulse: (!) 102  Resp: 16  Temp: 97.7 F (36.5 C)  SpO2: 98%   Filed Weights   07/18/19 1028  Weight: 162 lb (73.5 kg)    Body mass index is 25.37 kg/m.  BP Readings from Last 3 Encounters:  07/18/19 (!) 150/84  09/01/18 140/78  08/03/18 135/63    Wt Readings from Last 3 Encounters:  07/18/19 162 lb (73.5 kg)  09/01/18 187 lb 6.4 oz (85 kg)  08/03/18 198 lb (89.8 kg)     Physical Exam Constitutional: She appears well-developed and well-nourished. No distress.  HENT:  Head: Normocephalic and atraumatic.  Right Ear: External ear normal. Normal ear canal and TM Left Ear: External ear normal.  Normal  ear canal and TM Mouth/Throat: Oropharynx is clear and moist.  Eyes: Conjunctivae and EOM are normal.  Neck: Neck supple. No tracheal deviation present. No thyromegaly present.  No carotid bruit  Cardiovascular: Normal rate, regular rhythm and normal heart sounds.   No murmur heard.  Mild b/l LE edema. Pulmonary/Chest: Effort normal and breath sounds normal. No respiratory distress. She has no wheezes. She has no rales.  Breast: deferred   Abdominal: Soft. She exhibits no distension. There is no tenderness.  Lymphadenopathy: She has no cervical adenopathy.  Skin: Skin is warm and dry. She is not diaphoretic. Hyperpigmented, raised rough rash on b/l upper chest and posterior back Psychiatric: She has a normal mood and affect. Her behavior is normal.        Assessment & Plan:   Physical exam: Screening blood work    ordered Immunizations  Up to date , shingrix  Colonoscopy   - n/a aged out Mammogram   N/a  - aged out [place] to date  Eye exams  Up to date  Exercise   none Weight    Overweight, but has lost considerable weight over the past year Substance abuse  none  See Problem List for Assessment and Plan of chronic medical problems.     This visit occurred during the SARS-CoV-2 public health emergency.  Safety protocols were in place, including screening questions prior to the visit, additional usage of staff PPE, and extensive cleaning of exam room while observing  appropriate contact time as indicated for disinfecting solutions.

## 2019-07-17 NOTE — Patient Instructions (Addendum)
Blood work was ordered.  Chest xray today.  All other Health Maintenance issues reviewed.   All recommended immunizations and age-appropriate screenings are up-to-date or discussed.  No immunization administered today.   Consider getting the shingles vaccine.   Medications reviewed and updated.  Changes include :   none    Please followup in 6 months    Health Maintenance, Female Adopting a healthy lifestyle and getting preventive care are important in promoting health and wellness. Ask your health care provider about:  The right schedule for you to have regular tests and exams.  Things you can do on your own to prevent diseases and keep yourself healthy. What should I know about diet, weight, and exercise? Eat a healthy diet   Eat a diet that includes plenty of vegetables, fruits, low-fat dairy products, and lean protein.  Do not eat a lot of foods that are high in solid fats, added sugars, or sodium. Maintain a healthy weight Body mass index (BMI) is used to identify weight problems. It estimates body fat based on height and weight. Your health care provider can help determine your BMI and help you achieve or maintain a healthy weight. Get regular exercise Get regular exercise. This is one of the most important things you can do for your health. Most adults should:  Exercise for at least 150 minutes each week. The exercise should increase your heart rate and make you sweat (moderate-intensity exercise).  Do strengthening exercises at least twice a week. This is in addition to the moderate-intensity exercise.  Spend less time sitting. Even light physical activity can be beneficial. Watch cholesterol and blood lipids Have your blood tested for lipids and cholesterol at 79 years of age, then have this test every 5 years. Have your cholesterol levels checked more often if:  Your lipid or cholesterol levels are high.  You are older than 79 years of age.  You are at high  risk for heart disease. What should I know about cancer screening? Depending on your health history and family history, you may need to have cancer screening at various ages. This may include screening for:  Breast cancer.  Cervical cancer.  Colorectal cancer.  Skin cancer.  Lung cancer. What should I know about heart disease, diabetes, and high blood pressure? Blood pressure and heart disease  High blood pressure causes heart disease and increases the risk of stroke. This is more likely to develop in people who have high blood pressure readings, are of African descent, or are overweight.  Have your blood pressure checked: ? Every 3-5 years if you are 79 years of age. ? Every year if you are 79 years old or older. Diabetes Have regular diabetes screenings. This checks your fasting blood sugar level. Have the screening done:  Once every three years after age 79 if you are at a normal weight and have a low risk for diabetes.  More often and at a younger age if you are overweight or have a high risk for diabetes. What should I know about preventing infection? Hepatitis B If you have a higher risk for hepatitis B, you should be screened for this virus. Talk with your health care provider to find out if you are at risk for hepatitis B infection. Hepatitis C Testing is recommended for:  Everyone born from [birth year] through 1965.  Anyone with known risk factors for hepatitis C. Sexually transmitted infections (STIs)  Get screened for STIs, including gonorrhea and chlamydia, if: ? You are  sexually active and are younger than 79 years of age. ? You are older than 79 years of age and your health care provider tells you that you are at risk for this type of infection. ? Your sexual activity has changed since you were last screened, and you are at increased risk for chlamydia or gonorrhea. Ask your health care provider if you are at risk.  Ask your health care provider about whether you  are at high risk for HIV. Your health care provider may recommend a prescription medicine to help prevent HIV infection. If you choose to take medicine to prevent HIV, you should first get tested for HIV. You should then be tested every 3 months for as long as you are taking the medicine. Pregnancy  If you are about to stop having your period (premenopausal) and you may become pregnant, seek counseling before you get pregnant.  Take 400 to 800 micrograms (mcg) of folic acid every day if you become pregnant.  Ask for birth control (contraception) if you want to prevent pregnancy. Osteoporosis and menopause Osteoporosis is a disease in which the bones lose minerals and strength with aging. This can result in bone fractures. If you are 63 years old or older, or if you are at risk for osteoporosis and fractures, ask your health care provider if you should:  Be screened for bone loss.  Take a calcium or vitamin D supplement to lower your risk of fractures.  Be given hormone replacement therapy (HRT) to treat symptoms of menopause. Follow these instructions at home: Lifestyle  Do not use any products that contain nicotine or tobacco, such as cigarettes, e-cigarettes, and chewing tobacco. If you need help quitting, ask your health care provider.  Do not use street drugs.  Do not share needles.  Ask your health care provider for help if you need support or information about quitting drugs. Alcohol use  Do not drink alcohol if: ? Your health care provider tells you not to drink. ? You are pregnant, may be pregnant, or are planning to become pregnant.  If you drink alcohol: ? Limit how much you use to 0-1 drink a day. ? Limit intake if you are breastfeeding.  Be aware of how much alcohol is in your drink. In the [place] one drink equals one 12 oz bottle of beer (355 mL), one 5 oz glass of wine (148 mL), or one 1 oz glass of hard liquor (44 mL). General instructions  Schedule regular  health, dental, and eye exams.  Stay current with your vaccines.  Tell your health care provider if: ? You often feel depressed. ? You have ever been abused or do not feel safe at home. Summary  Adopting a healthy lifestyle and getting preventive care are important in promoting health and wellness.  Follow your health care provider's instructions about healthy diet, exercising, and getting tested or screened for diseases.  Follow your health care provider's instructions on monitoring your cholesterol and blood pressure. This information is not intended to replace advice given to you by your health care provider. Make sure you discuss any questions you have with your health care provider. Document Revised: 02/15/2018 Document Reviewed: 02/15/2018 Elsevier Patient Education  2020 [employer].

## 2019-07-18 ENCOUNTER — Other Ambulatory Visit: Payer: Self-pay

## 2019-07-18 ENCOUNTER — Ambulatory Visit (INDEPENDENT_AMBULATORY_CARE_PROVIDER_SITE_OTHER): Payer: Medicare HMO | Admitting: Internal Medicine

## 2019-07-18 ENCOUNTER — Encounter: Payer: Self-pay | Admitting: Internal Medicine

## 2019-07-18 ENCOUNTER — Ambulatory Visit (INDEPENDENT_AMBULATORY_CARE_PROVIDER_SITE_OTHER): Payer: Medicare HMO

## 2019-07-18 VITALS — BP 138/82 | HR 102 | Temp 97.7°F | Resp 16 | Ht 67.0 in | Wt 162.0 lb

## 2019-07-18 DIAGNOSIS — R05 Cough: Secondary | ICD-10-CM | POA: Diagnosis not present

## 2019-07-18 DIAGNOSIS — N184 Chronic kidney disease, stage 4 (severe): Secondary | ICD-10-CM | POA: Diagnosis not present

## 2019-07-18 DIAGNOSIS — E538 Deficiency of other specified B group vitamins: Secondary | ICD-10-CM

## 2019-07-18 DIAGNOSIS — Z0000 Encounter for general adult medical examination without abnormal findings: Secondary | ICD-10-CM | POA: Diagnosis not present

## 2019-07-18 DIAGNOSIS — E1365 Other specified diabetes mellitus with hyperglycemia: Secondary | ICD-10-CM

## 2019-07-18 DIAGNOSIS — R21 Rash and other nonspecific skin eruption: Secondary | ICD-10-CM | POA: Insufficient documentation

## 2019-07-18 DIAGNOSIS — R059 Cough, unspecified: Secondary | ICD-10-CM

## 2019-07-18 DIAGNOSIS — IMO0002 Reserved for concepts with insufficient information to code with codable children: Secondary | ICD-10-CM

## 2019-07-18 DIAGNOSIS — I10 Essential (primary) hypertension: Secondary | ICD-10-CM | POA: Diagnosis not present

## 2019-07-18 DIAGNOSIS — M10371 Gout due to renal impairment, right ankle and foot: Secondary | ICD-10-CM

## 2019-07-18 DIAGNOSIS — E785 Hyperlipidemia, unspecified: Secondary | ICD-10-CM | POA: Diagnosis not present

## 2019-07-18 DIAGNOSIS — R413 Other amnesia: Secondary | ICD-10-CM | POA: Insufficient documentation

## 2019-07-18 DIAGNOSIS — E0843 Diabetes mellitus due to underlying condition with diabetic autonomic (poly)neuropathy: Secondary | ICD-10-CM | POA: Diagnosis not present

## 2019-07-18 DIAGNOSIS — E1329 Other specified diabetes mellitus with other diabetic kidney complication: Secondary | ICD-10-CM | POA: Diagnosis not present

## 2019-07-18 DIAGNOSIS — M109 Gout, unspecified: Secondary | ICD-10-CM

## 2019-07-18 LAB — CBC WITH DIFFERENTIAL/PLATELET
Basophils Absolute: 0 10*3/uL (ref 0.0–0.1)
Basophils Relative: 0.5 % (ref 0.0–3.0)
Eosinophils Absolute: 0.1 10*3/uL (ref 0.0–0.7)
Eosinophils Relative: 1.8 % (ref 0.0–5.0)
HCT: 32.2 % — ABNORMAL LOW (ref 36.0–46.0)
Hemoglobin: 10.6 g/dL — ABNORMAL LOW (ref 12.0–15.0)
Lymphocytes Relative: 39.9 % (ref 12.0–46.0)
Lymphs Abs: 1.8 10*3/uL (ref 0.7–4.0)
MCHC: 33 g/dL (ref 30.0–36.0)
MCV: 99 fl (ref 78.0–100.0)
Monocytes Absolute: 0.5 10*3/uL (ref 0.1–1.0)
Monocytes Relative: 11.3 % (ref 3.0–12.0)
Neutro Abs: 2.1 10*3/uL (ref 1.4–7.7)
Neutrophils Relative %: 46.5 % (ref 43.0–77.0)
Platelets: 121 10*3/uL — ABNORMAL LOW (ref 150.0–400.0)
RBC: 3.25 Mil/uL — ABNORMAL LOW (ref 3.87–5.11)
RDW: 18 % — ABNORMAL HIGH (ref 11.5–15.5)
WBC: 4.4 10*3/uL (ref 4.0–10.5)

## 2019-07-18 LAB — COMPREHENSIVE METABOLIC PANEL
ALT: 47 U/L — ABNORMAL HIGH (ref 0–35)
AST: 73 U/L — ABNORMAL HIGH (ref 0–37)
Albumin: 4.2 g/dL (ref 3.5–5.2)
Alkaline Phosphatase: 81 U/L (ref 39–117)
BUN: 77 mg/dL — ABNORMAL HIGH (ref 6–23)
CO2: 29 mEq/L (ref 19–32)
Calcium: 10.5 mg/dL (ref 8.4–10.5)
Chloride: 100 mEq/L (ref 96–112)
Creatinine, Ser: 2.23 mg/dL — ABNORMAL HIGH (ref 0.40–1.20)
GFR: 25.63 mL/min — ABNORMAL LOW (ref >60.00)
Glucose, Bld: 110 mg/dL — ABNORMAL HIGH (ref 70–99)
Potassium: 4.2 mEq/L (ref 3.5–5.1)
Sodium: 140 mEq/L (ref 135–145)
Total Bilirubin: 0.9 mg/dL (ref 0.2–1.2)
Total Protein: 7.5 g/dL (ref 6.0–8.3)

## 2019-07-18 LAB — HEMOGLOBIN A1C: Hgb A1c MFr Bld: 6.2 % (ref 4.6–6.5)

## 2019-07-18 LAB — URIC ACID: Uric Acid, Serum: 3 mg/dL (ref 2.4–7.0)

## 2019-07-18 LAB — LIPID PANEL
Cholesterol: 208 mg/dL — ABNORMAL HIGH (ref 0–200)
HDL: 70.9 mg/dL (ref >39.00)
LDL Cholesterol: 122 mg/dL — ABNORMAL HIGH (ref 0–99)
NonHDL: 136.87
Total CHOL/HDL Ratio: 3
Triglycerides: 76 mg/dL (ref 0.0–149.0)
VLDL: 15.2 mg/dL (ref 0.0–40.0)

## 2019-07-18 LAB — VITAMIN B12: Vitamin B-12: >1500 pg/mL — ABNORMAL HIGH (ref 211–911)

## 2019-07-18 LAB — TSH: TSH: 2.39 u[IU]/mL (ref 0.35–4.50)

## 2019-07-18 NOTE — Assessment & Plan Note (Signed)
No current gout symptoms-more chronic in nature Possibly related to chronic kidney disease Continue allopurinol We will check uric acid level

## 2019-07-18 NOTE — Assessment & Plan Note (Signed)
Chronic On atorvastatin 40 mg daily Check lipid panel, CMP Continue statin

## 2019-07-18 NOTE — Assessment & Plan Note (Signed)
Chronic Taking B12 daily We will check B12 level

## 2019-07-18 NOTE — Assessment & Plan Note (Signed)
Chronic Following with Dr. [facility]ib[facility]

## 2019-07-18 NOTE — Assessment & Plan Note (Signed)
Chronic Decreased sensation bilateral feet Following with podiatry

## 2019-07-18 NOTE — Assessment & Plan Note (Addendum)
Somewhat chronic at this point Intermittent cough primarily dry She did receive a Z-Pak back in December, but it did not resolve the cough She is a poor historian, but denies any significant drainage or GERD We will check chest x-ray Can consider referral to pulmonary or ENT

## 2019-07-18 NOTE — Assessment & Plan Note (Signed)
Chronic Diet controlled Check A1c  

## 2019-07-18 NOTE — Assessment & Plan Note (Signed)
Her daughter notes some memory loss - she is unsure if the memory difficulties is normal for aging and more than that MMSE done in 2020 by the wellness nurse resulted in a score of 25/30 Discussed possible referral to neuropsychology for full evaluation-her daughter will think about it Discussed checking routine blood work, continuing social stimulation, puzzles, reading She is sedentary not exercising and I doubt that will change Encouraged healthy diet, good sleep and avoiding depression/anxiety Check B12, TSH

## 2019-07-18 NOTE — Assessment & Plan Note (Signed)
Acute non  Itchy rash bilateral upper chest and upper back Unsure of cause Can see dermatology for further evaluation

## 2019-07-19 ENCOUNTER — Other Ambulatory Visit: Payer: Self-pay

## 2019-07-19 ENCOUNTER — Telehealth: Payer: Self-pay | Admitting: Internal Medicine

## 2019-07-19 DIAGNOSIS — E785 Hyperlipidemia, unspecified: Secondary | ICD-10-CM

## 2019-07-19 DIAGNOSIS — N184 Chronic kidney disease, stage 4 (severe): Secondary | ICD-10-CM

## 2019-07-19 DIAGNOSIS — IMO0002 Reserved for concepts with insufficient information to code with codable children: Secondary | ICD-10-CM

## 2019-07-19 NOTE — Telephone Encounter (Signed)
Pts daughter aware of response below. 

## 2019-07-19 NOTE — Telephone Encounter (Signed)
Please call daughter with results-   Chest x-ray is normal without infection.  She can try giving her Claritin on a daily basis for a few weeks to see if this helps.  I can also refer her to a lung specialist if she likes.  Her anemia is better than it was 11 months ago.  Her platelet count is slightly low and we will need to monitor this-this is one of the cells that she has in her blood system.  Her kidney function is stable.  Her vitamin B12 level is very good.  Her liver tests are slightly elevated, which is new, but she has not had those checked in a year and a half so I do not know how new this is.  Her cholesterol is okay.  Her A1c is 6.2%, so your sugars are well controlled.  Her thyroid is in the normal range.   She should have blood work done in 2-3 months check a CBC and CMP.  This will recheck her blood counts and liver tests.  Please schedule.

## 2019-07-20 ENCOUNTER — Encounter: Payer: Self-pay | Admitting: Internal Medicine

## 2019-07-25 DIAGNOSIS — D631 Anemia in chronic kidney disease: Secondary | ICD-10-CM | POA: Diagnosis not present

## 2019-07-25 DIAGNOSIS — N184 Chronic kidney disease, stage 4 (severe): Secondary | ICD-10-CM | POA: Diagnosis not present

## 2019-07-25 DIAGNOSIS — I129 Hypertensive chronic kidney disease with stage 1 through stage 4 chronic kidney disease, or unspecified chronic kidney disease: Secondary | ICD-10-CM | POA: Diagnosis not present

## 2019-07-25 DIAGNOSIS — N2581 Secondary hyperparathyroidism of renal origin: Secondary | ICD-10-CM | POA: Diagnosis not present

## 2019-07-27 ENCOUNTER — Telehealth: Payer: Self-pay | Admitting: Internal Medicine

## 2019-07-27 DIAGNOSIS — N184 Chronic kidney disease, stage 4 (severe): Secondary | ICD-10-CM

## 2019-07-27 NOTE — Progress Notes (Signed)
  Chronic Care Management   Note  07/27/2019 Name: Erin Liu MRN: [id number] DOB: [birth year]  Erin Liu is a 79 y.o. year old female who is a primary care patient of Burns, Claudina Lick, MD. I reached out to Erin Liu by phone today in response to a referral sent by Ms. Marylynn Pearson Shambaugh's PCP, Binnie Rail, MD.   [name] was given information about Chronic Care Management services today including:  1. CCM service includes personalized support from designated clinical staff supervised by her physician, including individualized plan of care and coordination with other care providers 2. 24/7 contact phone numbers for assistance for urgent and routine care needs. 3. Service will only be billed when office clinical staff spend 20 minutes or more in a month to coordinate care. 4. Only one practitioner may furnish and bill the service in a calendar month. 5. The patient may stop CCM services at any time (effective at the end of the month) by phone call to the office staff.   Patient agreed to services and verbal consent obtained.  This note is not being shared with the patient for the following reason: To respect privacy (The patient or proxy has requested that the information not be shared). Follow up plan:   Earney [facility] Upstream Scheduler

## 2019-08-13 ENCOUNTER — Other Ambulatory Visit: Payer: Self-pay | Admitting: Internal Medicine

## 2019-09-05 ENCOUNTER — Ambulatory Visit: Payer: Medicare HMO

## 2019-09-06 ENCOUNTER — Ambulatory Visit (INDEPENDENT_AMBULATORY_CARE_PROVIDER_SITE_OTHER): Payer: Medicare HMO

## 2019-09-06 VITALS — BP 122/70 | HR 92 | Ht 67.0 in | Wt 150.2 lb

## 2019-09-06 DIAGNOSIS — Z0000 Encounter for general adult medical examination without abnormal findings: Secondary | ICD-10-CM | POA: Diagnosis not present

## 2019-09-06 NOTE — Patient Instructions (Addendum)
Erin Liu , Thank you for taking time to come for your Medicare Wellness Visit. I appreciate your ongoing commitment to your health goals. Please review the following plan we discussed and let me know if I can assist you in the future.   Screening recommendations/referrals: Colonoscopy: not a candidate for colon cancer screening Mammogram: 06/21/2014 Bone Density: 04/25/2017 Recommended yearly ophthalmology/optometry visit for glaucoma screening and checkup Recommended yearly dental visit for hygiene and checkup  Vaccinations: Influenza vaccine: 01/29/2019 Pneumococcal vaccine: completed Tdap vaccine: 09/11/2010 Shingles vaccine: never done; will check with local pharmacy   Covid-19: completed  Advanced directives: Advance directive discussed with you today. Even though you declined this today please call our office should you change your mind and we can give you the proper paperwork for you to fill out.  Conditions/risks identified: Please continue to do your personal lifestyle choices by: daily care of teeth and gums, regular physical activity (goal should be 5 days a week for 30 minutes), eat a healthy diet, avoid tobacco and drug use, limiting any alcohol intake, taking a low-dose aspirin (if not allergic or have been advised by your provider otherwise) and taking vitamins and minerals as recommended by your provider. Continue doing brain stimulating activities (puzzles, reading, adult coloring books, staying active) to keep memory sharp. Continue to eat heart healthy diet (full of fruits, vegetables, whole grains, lean protein, water--limit salt, fat, and sugar intake) and increase physical activity as tolerated.  Next appointment: Please schedule your next Medicare Wellness Visit with your Nurse Health Advisor in 1 year.    Preventive Care 79 Years and Older, Female Preventive care refers to lifestyle choices and visits with your health care provider that can promote health and  wellness. What does preventive care include?  A yearly physical exam. This is also called an annual well check.  Dental exams once or twice a year.  Routine eye exams. Ask your health care provider how often you should have your eyes checked.  Personal lifestyle choices, including:  Daily care of your teeth and gums.  Regular physical activity.  Eating a healthy diet.  Avoiding tobacco and drug use.  Limiting alcohol use.  Practicing safe sex.  Taking low-dose aspirin every day.  Taking vitamin and mineral supplements as recommended by your health care provider. What happens during an annual well check? The services and screenings done by your health care provider during your annual well check will depend on your age, overall health, lifestyle risk factors, and family history of disease. Counseling  Your health care provider may ask you questions about your:  Alcohol use.  Tobacco use.  Drug use.  Emotional well-being.  Home and relationship well-being.  Sexual activity.  Eating habits.  History of falls.  Memory and ability to understand (cognition).  Work and work Statistician.  Reproductive health. Screening  You may have the following tests or measurements:  Height, weight, and BMI.  Blood pressure.  Lipid and cholesterol levels. These may be checked every 5 years, or more frequently if you are over 79 years old.  Skin check.  Lung cancer screening. You may have this screening every year starting at age 79 if you have a 30-pack-year history of smoking and currently smoke or have quit within the past 15 years.  Fecal occult blood test (FOBT) of the stool. You may have this test every year starting at age 79  Flexible sigmoidoscopy or colonoscopy. You may have a sigmoidoscopy every 5 years or a colonoscopy every  10 years starting at age 79  Hepatitis C blood test.  Hepatitis B blood test.  Sexually transmitted disease (STD)  testing.  Diabetes screening. This is done by checking your blood sugar (glucose) after you have not eaten for a while (fasting). You may have this done every 1-3 years.  Bone density scan. This is done to screen for osteoporosis. You may have this done starting at age 79  Mammogram. This may be done every 1-2 years. Talk to your health care provider about how often you should have regular mammograms. Talk with your health care provider about your test results, treatment options, and if necessary, the need for more tests. Vaccines  Your health care provider may recommend certain vaccines, such as:  Influenza vaccine. This is recommended every year.  Tetanus, diphtheria, and acellular pertussis (Tdap, Td) vaccine. You may need a Td booster every 10 years.  Zoster vaccine. You may need this after age 79  Pneumococcal 13-valent conjugate (PCV13) vaccine. One dose is recommended after age 79  Pneumococcal polysaccharide (PPSV23) vaccine. One dose is recommended after age 79 Talk to your health care provider about which screenings and vaccines you need and how often you need them. This information is not intended to replace advice given to you by your health care provider. Make sure you discuss any questions you have with your health care provider. Document Released: 03/21/2015 Document Revised: 11/12/2015 Document Reviewed: 12/24/2014 Elsevier Interactive Patient Education  2017 [place] Prevention in the Home Falls can cause injuries. They can happen to people of all ages. There are many things you can do to make your home safe and to help prevent falls. What can I do on the outside of my home?  Regularly fix the edges of walkways and driveways and fix any cracks.  Remove anything that might make you trip as you walk through a door, such as a raised step or threshold.  Trim any bushes or trees on the path to your home.  Use bright outdoor lighting.  Clear any walking  paths of anything that might make someone trip, such as rocks or tools.  Regularly check to see if handrails are loose or broken. Make sure that both sides of any steps have handrails.  Any raised decks and porches should have guardrails on the edges.  Have any leaves, snow, or ice cleared regularly.  Use sand or salt on walking paths during winter.  Clean up any spills in your garage right away. This includes oil or grease spills. What can I do in the bathroom?  Use night lights.  Install grab bars by the toilet and in the tub and shower. Do not use towel bars as grab bars.  Use non-skid mats or decals in the tub or shower.  If you need to sit down in the shower, use a plastic, non-slip stool.  Keep the floor dry. Clean up any water that spills on the floor as soon as it happens.  Remove soap buildup in the tub or shower regularly.  Attach bath mats securely with double-sided non-slip rug tape.  Do not have throw rugs and other things on the floor that can make you trip. What can I do in the bedroom?  Use night lights.  Make sure that you have a light by your bed that is easy to reach.  Do not use any sheets or blankets that are too big for your bed. They should not hang down onto the floor.  Have a firm chair that has side arms. You can use this for support while you get dressed.  Do not have throw rugs and other things on the floor that can make you trip. What can I do in the kitchen?  Clean up any spills right away.  Avoid walking on wet floors.  Keep items that you use a lot in easy-to-reach places.  If you need to reach something above you, use a strong step stool that has a grab bar.  Keep electrical cords out of the way.  Do not use floor polish or wax that makes floors slippery. If you must use wax, use non-skid floor wax.  Do not have throw rugs and other things on the floor that can make you trip. What can I do with my stairs?  Do not leave any items  on the stairs.  Make sure that there are handrails on both sides of the stairs and use them. Fix handrails that are broken or loose. Make sure that handrails are as long as the stairways.  Check any carpeting to make sure that it is firmly attached to the stairs. Fix any carpet that is loose or worn.  Avoid having throw rugs at the top or bottom of the stairs. If you do have throw rugs, attach them to the floor with carpet tape.  Make sure that you have a light switch at the top of the stairs and the bottom of the stairs. If you do not have them, ask someone to add them for you. What else can I do to help prevent falls?  Wear shoes that:  Do not have high heels.  Have rubber bottoms.  Are comfortable and fit you well.  Are closed at the toe. Do not wear sandals.  If you use a stepladder:  Make sure that it is fully opened. Do not climb a closed stepladder.  Make sure that both sides of the stepladder are locked into place.  Ask someone to hold it for you, if possible.  Clearly mark and make sure that you can see:  Any grab bars or handrails.  First and last steps.  Where the edge of each step is.  Use tools that help you move around (mobility aids) if they are needed. These include:  Canes.  Walkers.  Scooters.  Crutches.  Turn on the lights when you go into a dark area. Replace any light bulbs as soon as they burn out.  Set up your furniture so you have a clear path. Avoid moving your furniture around.  If any of your floors are uneven, fix them.  If there are any pets around you, be aware of where they are.  Review your medicines with your doctor. Some medicines can make you feel dizzy. This can increase your chance of falling. Ask your doctor what other things that you can do to help prevent falls. This information is not intended to replace advice given to you by your health care provider. Make sure you discuss any questions you have with your health care  provider. Document Released: 12/19/2008 Document Revised: 07/31/2015 Document Reviewed: 03/29/2014 Elsevier Interactive Patient Education  2017 [employer].

## 2019-09-06 NOTE — Progress Notes (Signed)
I connected with Erin Liu today by telephone and verified that I am speaking with the correct person using two identifiers. Location patient: home Location provider: work Persons participating in the virtual visit: Erin Liu, Erin Liu (daughter) and Jule Ser. Anice Wilshire, LPN  I discussed the limitations, risks, security and privacy concerns of performing an evaluation and management service by telephone and the availability of in person appointments. I also discussed with the patient that there may be a patient responsible charge related to this service. The patient expressed understanding and verbally consented to this telephonic visit.    Interactive audio and video telecommunications were attempted between this provider and patient, however failed, due to patient having technical difficulties OR patient did not have access to video capability.  We continued and completed visit with audio only.  Some vital signs may be absent or patient reported.   Time Spent with patient on telephone encounter: 30 minutes   Subjective:   Erin Liu is a 79 y.o. female who presents for Medicare Annual (Subsequent) preventive examination.  Review of Systems    No ROS. Medicare Wellness Virtual Visit. Additional risk factors are reflected in social history. Cardiac Risk Factors include: advanced age (>52men, >27 women);diabetes mellitus;dyslipidemia;family history of premature cardiovascular disease;hypertension     Objective:    Today's Vitals   09/06/19 1551  BP: 122/70  Pulse: 92  SpO2: 97%  Weight: 150 lb 3.2 oz (68.1 kg)  Height: 5\' 7"  (1.702 m)  PainSc: 0-No pain   Body mass index is 23.52 kg/m.  Advanced Directives 09/06/2019 08/03/2018 05/12/2018 04/03/2018 10/02/2017 10/02/2017 05/10/2016  Does Patient Have a Medical Advance Directive? No No No No No No No  Would patient like information on creating a medical advance directive? No - Patient declined No - Patient  declined No - Patient declined Yes (ED - Information included in AVS) No - Patient declined - -    Current Medications (verified) Outpatient Encounter Medications as of 09/06/2019  Medication Sig   allopurinol (ZYLOPRIM) 100 MG tablet TAKE 2 TABLETS BY MOUTH DAILY   atorvastatin (LIPITOR) 40 MG tablet TAKE 1 TABLET BY MOUTH DAILY   Cyanocobalamin (VITAMIN B-12) 2000 MCG TBCR Take 1 tablet (2,000 mcg total) by mouth daily.   furosemide (LASIX) 20 MG tablet Take 20 mg by mouth daily.   latanoprost (XALATAN) 0.005 % ophthalmic solution Place 1 drop into both eyes at bedtime.   [DISCONTINUED] ciclopirox (LOPROX) 0.77 % cream Apply to both feet and between toes bid x 4 weeks.   [DISCONTINUED] acetaminophen (TYLENOL) 325 MG tablet Take 2 tablets (650 mg total) by mouth every 6 (six) hours as needed for mild pain.   No facility-administered encounter medications on file as of 09/06/2019.    Allergies (verified) Lisinopril   History: Past Medical History:  Diagnosis Date   ANEMIA, NORMOCYTIC    Arthritis    back   BREAST CYST, HX OF    CARPAL TUNNEL SYNDROME, BILATERAL    DIABETES MELLITUS, TYPE II    Glaucoma    HYPERLIPIDEMIA    HYPERTENSION    OBESITY    PERIPHERAL NEUROPATHY    RENAL FAILURE, CHRONIC    Stage 4   Past Surgical History:  Procedure Laterality Date   AV FISTULA PLACEMENT Left 05/23/2018   Procedure: ARTERIOVENOUS (AV) FISTULA CREATION LEFT ARM;  Surgeon: Waynetta Sandy, MD;  Location: [place];  Service: Vascular;  Laterality: Left;   [place] Left 08/03/2018  Procedure: SECOND STAGE BASILIC VEIN TRANSPOSITION LEFT ARM;  Surgeon: Waynetta Sandy, MD;  Location[facility];  Service: Vascular;  Laterality: Left;   CATARACT EXTRACTION BILATERAL W/ ANTERIOR VITRECTOMY  05/2010   COLONOSCOPY     [facility]/7-8 years   Family History  Problem Relation Age of Onset   Arthritis Son    Alcohol abuse Son    Diabetes Son     Hypertension Son    Colon cancer Son 15   Heart attack Mother        Died age 44s   Heart attack Father        No details 74s   Heart disease Sister        Slight heart attack, died age 31   Social History   Socioeconomic History   Marital status: Widowed    Spouse name: Not on file   Number of children: 3   Years of education: Not on file   Highest education level: Not on file  Occupational History   Not on file  Tobacco Use   Smoking status: Never Smoker   Smokeless tobacco: Never Used  Vaping Use   Vaping Use: Never used  Substance and Sexual Activity   Alcohol use: No   Drug use: No   Sexual activity: Not Currently  Other Topics Concern   Not on file  Social History Narrative   Widowed, lives with son. Retired Medical illustrator   Social Determinants of Radio broadcast assistant Strain: Low Risk    Difficulty of Paying Living Expenses: Not hard at all  Food Insecurity: No Food Insecurity   Worried About Charity fundraiser in the Last Year: Never true   Arboriculturist in the Last Year: Never true  Transportation Needs: No Transportation Needs   Lack of Transportation (Medical): No   Lack of Transportation (Non-Medical): No  Physical Activity: Inactive   Days of Exercise per Week: 0 days   Minutes of Exercise per Session: 0 min  Stress: No Stress Concern Present   Feeling of Stress : Not at all  Social Connections: Unknown   Frequency of Communication with Friends and Family: More than three times a week   Frequency of Social Gatherings with Friends and Family: Once a week   Attends Religious Services: Patient refused   Active Member of Clubs or Organizations: Patient refused   Attends Archivist Meetings: Patient refused   Marital Status: Widowed    Tobacco Counseling Counseling given: No   Clinical Intake:  Pre-visit preparation completed: Yes  Pain : No/denies pain Pain Score: 0-No pain     BMI -  recorded: 23.52 Nutritional Status: BMI of 19-24  Normal Nutritional Risks: Unintentional weight loss Diabetes: Yes CBG done?: No Did pt. bring in CBG monitor from home?: No  How often do you need to have someone help you when you read instructions, pamphlets, or other written materials from your doctor or pharmacy?: 1 - Never What is the last grade level you completed in school?: HSG  Diabetic? yes  Interpreter Needed?: No  Information entered by :: Deamber Buckhalter N. Richmond Coldren, LPN   Activities of Daily Living In your present state of health, do you have any difficulty performing the following activities: 09/06/2019  Hearing? N  Vision? N  Difficulty concentrating or making decisions? N  Walking or climbing stairs? Y  Comment lives on main level of house  Dressing or bathing? N  Doing errands, shopping? Darreld Mclean  Comment daughter brings her to appointments  Preparing Food and eating ? N  Using the Toilet? N  In the past six months, have you accidently leaked urine? N  Do you have problems with loss of bowel control? N  Managing your Medications? N  Managing your Finances? N  Housekeeping or managing your Housekeeping? N  Some recent data might be hidden    Patient Care Team: Binnie Rail, MD as PCP - General (Internal Medicine) Hayden Pedro, MD (Ophthalmology) Milus Banister, MD (Gastroenterology) Elmarie Shiley, MD (Nephrology) Charlton Haws, [facility] as Pharmacist (Pharmacist)  Indicate any recent Medical Services you may have received from other than [facility] providers in the past year (date may be approximate).     Assessment:   This is a routine wellness examination for Erin Liu.  Hearing/Vision screen No exam data present  Dietary issues and exercise activities discussed: Current Exercise Habits: The patient does not participate in regular exercise at present, Exercise limited by: cardiac condition(s);orthopedic condition(s);neurologic condition(s);Other - see comments  (gout)  Goals   None    Depression Screen PHQ 2/9 Scores 07/18/2019 04/03/2018 04/11/2017 02/25/2015 10/15/2013 05/10/2012  PHQ - 2 Score 0 1 0 1 0 0    Fall Risk Fall Risk  09/06/2019 07/18/2019 04/03/2018 04/11/2017 02/25/2015  Falls in the past year? 1 1 0 Yes No  Number falls in past yr: 0 1 - 2 or more -  Injury with Fall? 0 1 - No -  Risk for fall due to : Impaired balance/gait;Other (Comment) - Impaired balance/gait;Impaired mobility - -  Risk for fall due to: Comment back issues - - - -  Follow up Falls evaluation completed Falls evaluation completed Falls prevention discussed;Education provided - -    Any stairs in or around the home? Yes  If so, are there any without handrails? No  Home free of loose throw rugs in walkways, pet beds, electrical cords, etc? Yes  Adequate lighting in your home to reduce risk of falls? Yes   ASSISTIVE DEVICES UTILIZED TO PREVENT FALLS:  Life alert? No  Use of a cane, walker or w/c? Yes  Grab bars in the bathroom? Yes  Shower chair or bench in shower? Yes  Elevated toilet seat or a handicapped toilet? No   TIMED UP AND GO:  Was the test performed? No .  Length of time to ambulate 10 feet: 0 sec.   Gait slow and steady with assistive device  Cognitive Function: MMSE - Mini Mental State Exam 09/06/2019 04/03/2018  Not completed: Unable to complete -  Orientation to time - 4  Orientation to Place - 5  Registration - 3  Attention/ Calculation - 4  Recall - 1  Language- name 2 objects - 2  Language- repeat - 1  Language- follow 3 step command - 3  Language- read & follow direction - 1  Write a sentence - 1  Copy design - 0  Total score - 25        Immunizations Immunization History  Administered Date(s) Administered   Influenza Split 01/12/2011   Influenza Whole 01/17/2007, 12/10/2008, 01/08/2010   Influenza, High Dose Seasonal PF 12/11/2016, 12/19/2017   Influenza,inj,Quad PF,6+ Mos 11/10/2012   Influenza-Unspecified  12/07/2011, 12/22/2013, 01/08/2015   PFIZER SARS-COV-2 Vaccination 05/31/2019, 06/25/2019   Pneumococcal Conjugate-13 09/25/2013   Pneumococcal Polysaccharide-23 09/11/2010   Tdap 09/11/2010    TDAP status: Up to date Flu Vaccine status: Up to date Pneumococcal vaccine status: Up to date Covid-19  vaccine status: Completed vaccines  Qualifies for Shingles Vaccine? Yes   Zostavax completed No   Shingrix Completed?: No.    Education has been provided regarding the importance of this vaccine. Patient has been advised to call insurance company to determine out of pocket expense if they have not yet received this vaccine. Advised may also receive vaccine at local pharmacy or Health Dept. Verbalized acceptance and understanding.  Screening Tests Health Maintenance  Topic Date Due   Hepatitis C Screening  Never done   OPHTHALMOLOGY EXAM  12/13/2018   INFLUENZA VACCINE  10/07/2019   FOOT EXAM  11/27/2019   HEMOGLOBIN A1C  01/18/2020   TETANUS/TDAP  09/10/2020   DEXA SCAN  04/25/2022   COVID-19 Vaccine  Completed   PNA vac Low Risk Adult  Completed    Health Maintenance  Health Maintenance Due  Topic Date Due   Hepatitis C Screening  Never done   OPHTHALMOLOGY EXAM  12/13/2018    Colorectal cancer screening: No longer required.  Mammogram status: No longer required.  Bone Density status: Completed 04/25/2017. Results reflect: Bone density results: NORMAL. Repeat every 5 years.  Lung Cancer Screening: (Low Dose CT Chest recommended if Age 75-80 years, 30 pack-year currently smoking OR have quit w/in 15years.) does not qualify.   Lung Cancer Screening Referral: no  Additional Screening:  Hepatitis C Screening: does not qualify; Completed no  Vision Screening: Recommended annual ophthalmology exams for early detection of glaucoma and other disorders of the eye. Is the patient up to date with their annual eye exam?  Yes  Who is the provider or what is the name of  the office in which the patient attends annual eye exams? [facility] If pt is not established with a provider, would they like to be referred to a provider to establish care? No .   Dental Screening: Recommended annual dental exams for proper oral hygiene  Community Resource Referral / Chronic Care Management: CRR required this visit?  No   CCM required this visit?  No      Plan:     I have personally reviewed and noted the following in the patients chart:    Medical and social history  Use of alcohol, tobacco or illicit drugs   Current medications and supplements  Functional ability and status  Nutritional status  Physical activity  Advanced directives  List of other physicians  Hospitalizations, surgeries, and ER visits in previous 12 months  Vitals  Screenings to include cognitive, depression, and falls  Referrals and appointments  In addition, I have reviewed and discussed with patient certain preventive protocols, quality metrics, and best practice recommendations. A written personalized care plan for preventive services as well as general preventive health recommendations were provided to patient.     Sheral Flow, LPN   03/11/863   Nurse Notes:  Not able to perform cognitive testing on patient due to telephone visit. Questions were being answered by daughter. Vital signs were done by [facility] today. There is a concern for unintentional weight loss. Patient's daughter stated that patient has no issues with gait or balance and that she uses a cane for assistance when going to appointments. Dr. Billey Gosling was out of the office during this visit and will be co-signed by another provider.

## 2019-09-11 DIAGNOSIS — R634 Abnormal weight loss: Secondary | ICD-10-CM | POA: Insufficient documentation

## 2019-09-11 NOTE — Progress Notes (Signed)
Subjective:    Patient ID: Erin Liu, female    DOB: Sep 20, 1940, 79 y.o.   MRN: [id number]  HPI The patient is here for an acute visit.  Her daughter, [name], is here with her.  She provides the history given the patient's mild memory issues.   Unintentional weight loss:  She has lost 10 lb on our scale from 07/18/2019 to today.  She does not weigh herself at home on a regular basis.  She lives with her son.  She does not exercise and is very sedentary.  She sleeps intermittently during the day.  Her son is home during the day, but typically does not bother her.  When she eats she eats good, but does miss meals.  She eats dinner every day. She sometimes eat breakfast and sometimes eats lunch.  She is the one that has to fix her breakfast and lunch and often she states she does not feel hungry and does not want to go and fix something.  When asked if she was hungry at breakfast she said no, lunch she said no dinner she said no.  Her daughter states whenever food is in front of her she does eat all over.  She does not know what time she goes to bed.  She fixes her own breakfast and lunch.  Medications and allergies reviewed with patient and updated if appropriate.  Patient Active Problem List   Diagnosis Date Noted  . Weight loss, unintentional 09/11/2019  . Rash 07/18/2019  . Memory disorder 07/18/2019  . Hand tingling 07/24/2018  . Physical deconditioning 01/13/2018  . Hypokalemia 01/12/2018  . Tremor of both hands 10/10/2017  . Urinary incontinence 10/10/2017  . Vasovagal syncope 10/02/2017  . Gout 07/12/2017  . B12 deficiency 04/11/2017  . Cough 08/26/2015  . Lumbago 02/25/2015  . Essential tremor   . Glaucoma 01/08/2010  . Chronic kidney disease (CKD), stage IV (severe) ([place]) 12/10/2008  . DM (diabetes mellitus), secondary, uncontrolled, with renal complications ([place]) 60/63/0160  . Diabetic neuropathy ([place]) 02/22/2006  . Dyslipidemia 02/22/2006  . Deficiency anemia  02/22/2006  . CARPAL TUNNEL SYNDROME, BILATERAL 02/22/2006    Current Outpatient Medications on File Prior to Visit  Medication Sig Dispense Refill  . allopurinol (ZYLOPRIM) 100 MG tablet TAKE 2 TABLETS BY MOUTH DAILY 180 tablet 0  . atorvastatin (LIPITOR) 40 MG tablet TAKE 1 TABLET BY MOUTH DAILY 90 tablet 0  . Cyanocobalamin (VITAMIN B-12) 2000 MCG TBCR Take 1 tablet (2,000 mcg total) by mouth daily. 90 tablet 1  . furosemide (LASIX) 20 MG tablet Take 20 mg by mouth daily.    Marland Kitchen latanoprost (XALATAN) 0.005 % ophthalmic solution Place 1 drop into both eyes at bedtime.  3   No current facility-administered medications on file prior to visit.    Past Medical History:  Diagnosis Date  . ANEMIA, NORMOCYTIC   . Arthritis    back  . BREAST CYST, HX OF   . CARPAL TUNNEL SYNDROME, BILATERAL   . DIABETES MELLITUS, TYPE II   . Glaucoma   . HYPERLIPIDEMIA   . HYPERTENSION   . OBESITY   . PERIPHERAL NEUROPATHY   . RENAL FAILURE, CHRONIC    Stage 4    Past Surgical History:  Procedure Laterality Date  . AV FISTULA PLACEMENT Left 05/23/2018   Procedure: ARTERIOVENOUS (AV) FISTULA CREATION LEFT ARM;  Surgeon: Waynetta Sandy, MD;  Location: [facility];  Service: Vascular;  Laterality: Left;  . BASCILIC VEIN TRANSPOSITION  Left 08/03/2018   Procedure: SECOND STAGE BASILIC VEIN TRANSPOSITION LEFT ARM;  Surgeon: Waynetta Sandy, MD;  Location: [facility];  Service: Vascular;  Laterality: Left;  . CATARACT EXTRACTION BILATERAL W/ ANTERIOR VITRECTOMY  05/2010  . COLONOSCOPY     [facility]/7-8 years    Social History   Socioeconomic History  . Marital status: Widowed    Spouse name: Not on file  . Number of children: 3  . Years of education: Not on file  . Highest education level: Not on file  Occupational History  . Not on file  Tobacco Use  . Smoking status: Never Smoker  . Smokeless tobacco: Never Used  Vaping Use  . Vaping Use: Never used  Substance and Sexual Activity  .  Alcohol use: No  . Drug use: No  . Sexual activity: Not Currently  Other Topics Concern  . Not on file  Social History Narrative   Widowed, lives with son. Retired Medical illustrator   Social Determinants of Radio broadcast assistant Strain: [place]   . Difficulty of Paying Living Expenses: Not hard at all  Food Insecurity: No Food Insecurity  . Worried About Charity fundraiser in the Last Year: Never true  . Ran Out of Food in the Last Year: Never true  Transportation Needs: No Transportation Needs  . Lack of Transportation (Medical): No  . Lack of Transportation (Non-Medical): No  Physical Activity: Inactive  . Days of Exercise per Week: 0 days  . Minutes of Exercise per Session: 0 min  Stress: No Stress Concern Present  . Feeling of Stress : Not at all  Social Connections: Unknown  . Frequency of Communication with Friends and Family: More than three times a week  . Frequency of Social Gatherings with Friends and Family: Once a week  . Attends Religious Services: Patient refused  . Active Member of Clubs or Organizations: Patient refused  . Attends Archivist Meetings: Patient refused  . Marital Status: Widowed    Family History  Problem Relation Age of Onset  . Arthritis Son   . Alcohol abuse Son   . Diabetes Son   . Hypertension Son   . Colon cancer Son 30  . Heart attack Mother        Died age 71s  . Heart attack Father        No details 89s  . Heart disease Sister        Slight heart attack, died age 73    Review of Systems  Constitutional: Positive for appetite change (decreased). Negative for diaphoresis and fever.  HENT: Negative for trouble swallowing.   Respiratory: Positive for cough (dry). Negative for shortness of breath and wheezing.   Cardiovascular: Negative for chest pain, palpitations and leg swelling.  Gastrointestinal: Negative for abdominal pain, blood in stool, constipation, diarrhea and nausea.       No gerd  Neurological:  Negative for dizziness, light-headedness and headaches.       Objective:   Vitals:   09/12/19 1531  BP: 132/70  Pulse: 64  Temp: 97.9 F (36.6 C)  SpO2: 98%   BP Readings from Last 3 Encounters:  09/12/19 132/70  09/06/19 122/70  07/20/19 138/82   Wt Readings from Last 3 Encounters:  09/12/19 152 lb 12.8 oz (69.3 kg)  09/06/19 150 lb 3.2 oz (68.1 kg)  07/18/19 162 lb (73.5 kg)   Body mass index is 23.93 kg/m.   Physical Exam Constitutional:  General: She is not in acute distress.    Appearance: Normal appearance. She is not ill-appearing.  HENT:     Head: Normocephalic and atraumatic.  Cardiovascular:     Rate and Rhythm: Normal rate and regular rhythm.  Pulmonary:     Effort: Pulmonary effort is normal. No respiratory distress.     Breath sounds: Normal breath sounds. No wheezing or rales.  Abdominal:     General: There is no distension.     Palpations: Abdomen is soft.     Tenderness: There is no abdominal tenderness. There is no guarding or rebound.  Musculoskeletal:     Right lower leg: No edema.     Left lower leg: No edema.  Skin:    General: Skin is warm and dry.  Neurological:     Mental Status: She is alert.     Comments: Resting tremor bilateral hands            Assessment & Plan:   20 minutes were spent face-to-face with the patient and her daughter reviewing history, reviewing old labs and imaging, discussing weight loss, nutrition and importance of eating regularly.  Discussed possible reasons for the weight loss.  Discussed the importance of making sure she does not lose any more weight and Monitoring her weight more closely    See Problem List for Assessment and Plan of chronic medical problems.    This visit occurred during the SARS-CoV-2 public health emergency.  Safety protocols were in place, including screening questions prior to the visit, additional usage of staff PPE, and extensive cleaning of exam room while observing  appropriate contact time as indicated for disinfecting solutions.

## 2019-09-12 ENCOUNTER — Other Ambulatory Visit: Payer: Self-pay

## 2019-09-12 ENCOUNTER — Ambulatory Visit (INDEPENDENT_AMBULATORY_CARE_PROVIDER_SITE_OTHER): Payer: Medicare HMO | Admitting: Internal Medicine

## 2019-09-12 ENCOUNTER — Encounter: Payer: Self-pay | Admitting: Internal Medicine

## 2019-09-12 VITALS — BP 132/70 | HR 64 | Temp 97.9°F | Ht 67.0 in | Wt 152.8 lb

## 2019-09-12 DIAGNOSIS — R7989 Other specified abnormal findings of blood chemistry: Secondary | ICD-10-CM | POA: Insufficient documentation

## 2019-09-12 DIAGNOSIS — R413 Other amnesia: Secondary | ICD-10-CM | POA: Diagnosis not present

## 2019-09-12 DIAGNOSIS — R634 Abnormal weight loss: Secondary | ICD-10-CM | POA: Diagnosis not present

## 2019-09-12 NOTE — Patient Instructions (Addendum)
  Blood work was ordered.     Medications reviewed and updated.  Changes include :   none    Please followup sooner than November if her does not increase

## 2019-09-12 NOTE — Assessment & Plan Note (Signed)
LFTs slightly elevated at her last visit Will repeat CMP today Her daughter did discontinue her Tylenol, which she was taking 2 pills daily for arthritis

## 2019-09-12 NOTE — Assessment & Plan Note (Signed)
Acute She has lost a significant amount of weight over the past year In discussing with her daughter it seems that the main reason for the weight loss is decreased oral intake.  She is missing meals. She states decreased appetite, but her meals are often missed because she does not want to go in 6 them-it sounds like she misses many breakfast and lunches Her son does make sure that she eats dinner. Her daughter thinks she can have 2 different family members make sure they bring her lunch every day to eat and in addition to eating her dinner nightly Discussed that she may be eligible for Meals on Wheels and that is also something she can look into Discussed providing snacks for her that are easy that require no preparation She will start monitoring her mother's weight at home once a week and if her weight does not improve or continues to decrease she will bring her in sooner than her next scheduled appointment At this point there are no concerning symptoms that I think we need to do further evaluation for the weight loss and the explanation above does make sense She does have some mild memory issues that are likely contributing to possibly forgetting to eat

## 2019-09-12 NOTE — Assessment & Plan Note (Signed)
Chronic Her daughter has noticed some issues with her memory and with her weight loss and not eating certain meals-supposedly because she does not want to get up and fix them her memory difficulties could be contributing Discussed with her daughter about further evaluation with neurology, but she deferred for now Monitor

## 2019-09-13 ENCOUNTER — Telehealth: Payer: Self-pay | Admitting: Internal Medicine

## 2019-09-13 LAB — COMPREHENSIVE METABOLIC PANEL
AG Ratio: 1.3 (calc) (ref 1.0–2.5)
ALT: 20 U/L (ref 6–29)
AST: 31 U/L (ref 10–35)
Albumin: 4.1 g/dL (ref 3.6–5.1)
Alkaline phosphatase (APISO): 68 U/L (ref 37–153)
BUN/Creatinine Ratio: 35 (calc) — ABNORMAL HIGH (ref 6–22)
BUN: 108 mg/dL — ABNORMAL HIGH (ref 7–25)
CO2: 28 mmol/L (ref 20–32)
Calcium: 9.9 mg/dL (ref 8.6–10.4)
Chloride: 100 mmol/L (ref 98–110)
Creat: 3.07 mg/dL — ABNORMAL HIGH (ref 0.60–0.93)
Globulin: 3.1 g/dL (calc) (ref 1.9–3.7)
Glucose, Bld: 122 mg/dL — ABNORMAL HIGH (ref 65–99)
Potassium: 4.4 mmol/L (ref 3.5–5.3)
Sodium: 136 mmol/L (ref 135–146)
Total Bilirubin: 0.9 mg/dL (ref 0.2–1.2)
Total Protein: 7.2 g/dL (ref 6.1–8.1)

## 2019-09-13 NOTE — Telephone Encounter (Signed)
Spoke with patient's daughter [name] and info given. She will call back if her mom isn't able to see the kidney doctor next week to come in for labs.

## 2019-09-13 NOTE — Telephone Encounter (Signed)
Please call her daughter - let her know her liver tests are normal.  Her kidney function is worse and she looks dehydrated.    She should hold the lasix - can take if needed for leg swelling, but with her weight loss she may not need it.    Increase water intake.    Needs fu with kidney doctor - if she is not able to get in soon she needs to recheck kidney function early next week - let me know and I will order

## 2019-09-18 DIAGNOSIS — N184 Chronic kidney disease, stage 4 (severe): Secondary | ICD-10-CM | POA: Diagnosis not present

## 2019-09-24 ENCOUNTER — Encounter: Payer: Self-pay | Admitting: Internal Medicine

## 2019-09-25 ENCOUNTER — Telehealth: Payer: Self-pay | Admitting: *Deleted

## 2019-09-25 NOTE — Telephone Encounter (Signed)
A user error has taken place open in  Error.Marland KitchenJohny Liu

## 2019-10-02 ENCOUNTER — Telehealth: Payer: Medicare HMO

## 2019-10-02 NOTE — Progress Notes (Deleted)
[facility]  Name: MALLARY KREGER  MRN: [id number] DOB: 1941/02/06   Chief Complaint/ HPI  Erin Liu,  79 y.o. , female presents for their Initial CCM visit with the clinical pharmacist via telephone due to COVID-19 Pandemic.  PCP : Binnie Rail, MD Patient Care Team: Binnie Rail, MD as PCP - General (Internal Medicine) Hayden Pedro, MD (Ophthalmology) Milus Banister, MD (Gastroenterology) Elmarie Shiley, MD (Nephrology) Charlton Haws, [facility] as Pharmacist (Pharmacist)  Their chronic conditions include: Hyperlipidemia, Diabetes, Chronic Kidney Disease, Gout and Glaucoma   Office Visits: 09/12/19 Dr Quay Burow OV: unintentional wt loss (10 lbs since 07/18/19). Daughter has d/c'd Tylenol for arthritis. Deferred neurology referral to evaluate memory disorder. Wt loss likely due to missing meals. May look into Meals on Wheels.  07/18/19 Dr Quay Burow OV: chronic f/u. Rec'd Claritin for chronic cough. Anemia improved, PLT slightly low, will monitor. LFT slightly elevated, will monitor.   03/21/19 Mickel Baas VV: rx'd Zpack and benzonatate for cough  Consult Visit: 07/17/19 Dr Katy Fitch (ophthalmology) 04/23/19 Dr Posey Pronto (nephrology)  Allergies  Allergen Reactions  . Lisinopril Cough    Medications: Outpatient Encounter Medications as of 10/02/2019  Medication Sig  . allopurinol (ZYLOPRIM) 100 MG tablet TAKE 2 TABLETS BY MOUTH DAILY  . atorvastatin (LIPITOR) 40 MG tablet TAKE 1 TABLET BY MOUTH DAILY  . Cyanocobalamin (VITAMIN B-12) 2000 MCG TBCR Take 1 tablet (2,000 mcg total) by mouth daily.  . furosemide (LASIX) 20 MG tablet Take 20 mg by mouth daily.  Marland Kitchen latanoprost (XALATAN) 0.005 % ophthalmic solution Place 1 drop into both eyes at bedtime.   No facility-administered encounter medications on file as of 10/02/2019.     Current Diagnosis/Assessment:    Goals Addressed   None     Hyperlipidemia   LDL goal < 100  Lipid Panel     Component Value  Date/Time   CHOL 208 (H) 07/18/2019 1135   TRIG 76.0 07/18/2019 1135   HDL 70.90 07/18/2019 1135   LDLCALC 122 (H) 07/18/2019 1135   LDLDIRECT 230.5 09/11/2010 1009    Hepatic Function Latest Ref Rng & Units 09/12/2019 07/18/2019 01/18/2018  Total Protein 6.1 - 8.1 g/dL 7.2 7.5 7.2  Albumin 3.5 - 5.2 g/dL - 4.2 3.6  AST 10 - 35 U/L 31 73(H) 24  ALT 6 - 29 U/L 20 47(H) 12  Alk Phosphatase 39 - 117 U/L - 81 64  Total Bilirubin 0.2 - 1.2 mg/dL 0.9 0.9 0.6  Bilirubin, Direct 0.0 - 0.3 mg/dL - - -     The ASCVD Risk score Mikey Bussing DC Jr., et al., 2013) failed to calculate for the following reasons:   The patient has a prior MI or stroke diagnosis   Patient has failed these meds in past: *** Patient is currently {CHL Controlled/[phone]} on the following medications:  . Atorvastatin 40 mg daily  We discussed:  {CHL [facility] discussion:[phone]}  Plan  Continue {CHL HP [facility] Pharm[facility] Plans:[phone]}  Diabetes   A1c goal <7%  Recent Relevant Labs: Lab Results  Component Value Date/Time   HGBA1C 6.2 07/18/2019 11:35 AM   HGBA1C 5.6 08/03/2018 06:37 AM   GFR 25.63 (L) 07/18/2019 11:35 AM   GFR 22.03 (L) 01/18/2018 03:25 PM   MICROALBUR 5.3 (H) 02/25/2015 11:18 AM   MICROALBUR 14.9 (H) 10/15/2013 09:11 AM    Last diabetic Eye exam:  Lab Results  Component Value Date/Time   HMDIABEYEEXA Retinopathy (A) 12/12/2017 12:00 AM  Last diabetic Foot exam:  Lab Results  Component Value Date/Time   HMDIABFOOTEX done 11/27/2018 12:00 AM     Checking BG: {CHL HP Blood Glucose Monitoring Frequency:[phone]}  Recent FBG Readings: *** Recent pre-meal BG readings: *** Recent 2hr PP BG readings:  *** Recent HS BG readings: ***  Patient has failed these meds in past: *** Patient is currently {CHL Controlled/Uncontrolled:9181112613} on the following medications: . No medications  We discussed: {CHL [facility] Pharm[facility]  discussion:[phone]}  Plan  Continue {CHL HP [facility] Pharmacy Plans:[phone]}  LE edema   Patient has failed these meds in past: *** Patient is currently {CHL Controlled/Uncontrolled:9181112613} on the following medications:  . Furosemide 20 mg daily  We discussed:  ***  Plan  Continue {CHL HP [facility] Pharmacy Plans:[phone]}  Weight maintenance   Wt Readings from Last 3 Encounters:  09/12/19 152 lb 12.8 oz (69.3 kg)  09/06/19 150 lb 3.2 oz (68.1 kg)  07/18/19 162 lb (73.5 kg)   Patient has failed these meds in past: *** Patient is currently {CHL Controlled/Uncontrolled:9181112613} on the following medications:  . ***  We discussed:  ***  Plan  Continue {CHL HP [facility] Pharmacy Plans:[phone]}   Gout    Ref. Range 01/10/2018 15:55 07/18/2019 11:35  Uric Acid, Serum Latest Ref Range: 2.4 - 7.0 mg/dL 3.3 3.0   Patient has failed these meds in past: *** Patient is currently {CHL Controlled/Uncontrolled:9181112613} on the following medications:  . Allopurinol 100 mg daily  We discussed:  ***  Plan  Continue {CHL HP [facility] Pharmacy XQJJH:[id number]}  Glaucoma   Patient has failed these meds in past: *** Patient is currently {CHL Controlled/Uncontrolled:9181112613} on the following medications:  . Latanoprost 0.005% eye drops  We discussed:  ***  Plan  Continue {CHL HP [facility] Pharmacy Plans:[phone]}  Health Maintenance   Lab Results  Component Value Date/Time   VITAMINB12 >1500 (H) 07/18/2019 11:35 AM   VITAMINB12 366 04/11/2017 10:14 AM   Patient is currently {CHL Controlled/Uncontrolled:9181112613} on the following medications:  Marland Kitchen Vitamin B12 2000 mcg daily  We discussed:  ***  Plan  Continue {CHL HP [facility] Pharmacy Plans:[phone]}  Medication Management   Pt uses [facility] for all medications Uses pill box? {Yes or If no, why not?:20788} Pt endorses ***% compliance  We discussed: ***  Plan  {[facility] Pharmacy  JEHU:31497}    Follow up: *** month phone visit  ***

## 2019-10-04 NOTE — Addendum Note (Signed)
Addended by: Aviva Signs M on: 10/04/2019 11:34 AM   Modules accepted: Orders

## 2019-10-29 ENCOUNTER — Other Ambulatory Visit: Payer: Self-pay | Admitting: Internal Medicine

## 2019-10-31 DIAGNOSIS — N2581 Secondary hyperparathyroidism of renal origin: Secondary | ICD-10-CM | POA: Diagnosis not present

## 2019-10-31 DIAGNOSIS — N184 Chronic kidney disease, stage 4 (severe): Secondary | ICD-10-CM | POA: Diagnosis not present

## 2019-10-31 DIAGNOSIS — D631 Anemia in chronic kidney disease: Secondary | ICD-10-CM | POA: Diagnosis not present

## 2019-10-31 DIAGNOSIS — N189 Chronic kidney disease, unspecified: Secondary | ICD-10-CM | POA: Diagnosis not present

## 2019-10-31 DIAGNOSIS — I129 Hypertensive chronic kidney disease with stage 1 through stage 4 chronic kidney disease, or unspecified chronic kidney disease: Secondary | ICD-10-CM | POA: Diagnosis not present

## 2019-11-04 ENCOUNTER — Other Ambulatory Visit: Payer: Self-pay | Admitting: Internal Medicine

## 2020-01-18 ENCOUNTER — Ambulatory Visit: Payer: Medicare HMO | Admitting: Internal Medicine

## 2020-01-20 NOTE — Progress Notes (Signed)
Subjective:    Patient ID: Erin Liu, female    DOB: 1940/03/28, 79 y.o.   MRN: [id number]  HPI The patient is here for follow up of their chronic medical problems, including DM with neuropathy and CKD, hyperlipidemia, gout, B12 def  She is here with her daughter.  Neither of them have concerns.    Medications and allergies reviewed with patient and updated if appropriate.  Patient Active Problem List   Diagnosis Date Noted  . Weight loss, unintentional 09/11/2019  . Rash 07/18/2019  . Memory disorder 07/18/2019  . Hand tingling 07/24/2018  . Physical deconditioning 01/13/2018  . Tremor of both hands 10/10/2017  . Urinary incontinence 10/10/2017  . Vasovagal syncope 10/02/2017  . Gout 07/12/2017  . B12 deficiency 04/11/2017  . Cough 08/26/2015  . Lumbago 02/25/2015  . Essential tremor   . Glaucoma 01/08/2010  . Chronic kidney disease (CKD), stage IV (severe) ([place]) 12/10/2008  . DM (diabetes mellitus), secondary, uncontrolled, with renal complications ([place]) 37/12/6267  . Diabetic neuropathy ([place]) 02/22/2006  . Dyslipidemia 02/22/2006  . 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 CARPAL TUNNEL SYNDROME, BILATERAL 02/22/2006    Current Outpatient Medications on File Prior to Visit  Medication Sig Dispense Refill  . allopurinol (ZYLOPRIM) 100 MG tablet TAKE 2 TABLETS BY MOUTH DAILY 180 tablet 0  . atorvastatin (LIPITOR) 40 MG tablet TAKE 1 TABLET BY MOUTH DAILY 90 tablet 1  . Cyanocobalamin (VITAMIN B-12) 2000 MCG TBCR Take 1 tablet (2,000 mcg total) by mouth daily. 90 tablet 1  . furosemide (LASIX) 20 MG tablet Take 20 mg by mouth every other day.     . latanoprost (XALATAN) 0.005 % ophthalmic solution Place 1 drop into both eyes at bedtime.  3  . furosemide (LASIX) 40 MG tablet  (Patient not taking: Reported on 01/21/2020)     No current facility-administered medications on file prior to visit.    Past Medical History:  Diagnosis Date  . ANEMIA, NORMOCYTIC   .  Arthritis    back  . BREAST CYST, HX OF   . CARPAL TUNNEL SYNDROME, BILATERAL   . DIABETES MELLITUS, TYPE II   . Glaucoma   . HYPERLIPIDEMIA   . HYPERTENSION   . OBESITY   . PERIPHERAL NEUROPATHY   . RENAL FAILURE, CHRONIC    Stage 4    Past Surgical History:  Procedure Laterality Date  . AV FISTULA PLACEMENT Left 05/23/2018   Procedure: ARTERIOVENOUS (AV) FISTULA CREATION LEFT ARM;  Surgeon: Waynetta Sandy, MD;  Location: [facility];  Service: Vascular;  Laterality: Left;  . BASCILIC VEIN TRANSPOSITION Left 08/03/2018   Procedure: SECOND STAGE BASILIC VEIN TRANSPOSITION LEFT ARM;  Surgeon: Waynetta Sandy, MD;  Location: [place];  Service: Vascular;  Laterality: Left;  . CATARACT EXTRACTION BILATERAL W/ ANTERIOR VITRECTOMY  05/2010  . COLONOSCOPY     [facility]/7-8 years    Social History   Socioeconomic History  . Marital status: Widowed    Spouse name: Not on file  . Number of children: 3  . Years of education: Not on file  . Highest education level: Not on file  Occupational History  . Not on file  Tobacco Use  . Smoking status: Never Smoker  . Smokeless tobacco: Never Used  Vaping Use  . Vaping Use: Never used  Substance and Sexual Activity  . Alcohol use: No  . Drug use: No  . Sexual activity: Not Currently  Other Topics Concern  . Not on  file  Social History Narrative   Widowed, lives with son. Retired Medical illustrator   Social Determinants of Radio broadcast assistant Strain: [place]   . Difficulty of Paying Living Expenses: Not hard at all  Food Insecurity: No Food Insecurity  . Worried About Charity fundraiser in the Last Year: Never true  . Ran Out of Food in the Last Year: Never true  Transportation Needs: No Transportation Needs  . Lack of Transportation (Medical): No  . Lack of Transportation (Non-Medical): No  Physical Activity: Inactive  . Days of Exercise per Week: 0 days  . Minutes of Exercise per Session: 0 min  Stress: No  Stress Concern Present  . Feeling of Stress : Not at all  Social Connections: Unknown  . Frequency of Communication with Friends and Family: More than three times a week  . Frequency of Social Gatherings with Friends and Family: Once a week  . Attends Religious Services: Patient refused  . Active Member of Clubs or Organizations: Patient refused  . Attends Archivist Meetings: Patient refused  . Marital Status: Widowed    Family History  Problem Relation Age of Onset  . Arthritis Son   . Alcohol abuse Son   . Diabetes Son   . Hypertension Son   . Colon cancer Son 74  . Heart attack Mother        Died age 76s  . Heart attack Father        No details 5s  . Heart disease Sister        Slight heart attack, died age 5    Review of Systems  Constitutional: Negative for chills and fever.  Respiratory: Negative for cough, shortness of breath and wheezing.   Cardiovascular: Positive for leg swelling. Negative for chest pain and palpitations.  Gastrointestinal: Negative for abdominal pain, constipation, diarrhea and nausea.  Neurological: Negative for light-headedness and headaches.       Objective:   Vitals:   01/21/20 1518  BP: 126/70  Pulse: 99  Temp: 97.9 F (36.6 C)  SpO2: 99%   BP Readings from Last 3 Encounters:  01/21/20 126/70  09/12/19 132/70  09/06/19 122/70   Wt Readings from Last 3 Encounters:  01/21/20 158 lb (71.7 kg)  09/12/19 152 lb 12.8 oz (69.3 kg)  09/06/19 150 lb 3.2 oz (68.1 kg)   Body mass index is 24.75 kg/m.   Physical Exam    Constitutional: Appears well-developed and well-nourished. No distress.  HENT:  Head: Normocephalic and atraumatic.  Neck: Neck supple. No tracheal deviation present. No thyromegaly present.  No cervical lymphadenopathy Cardiovascular: Normal rate, regular rhythm and normal heart sounds.   No murmur heard. No carotid bruit .  No edema Pulmonary/Chest: Effort normal and breath sounds normal. No  respiratory distress. No has no wheezes. No rales.  Skin: Skin is warm and dry. Not diaphoretic.  Psychiatric: Normal mood and affect. Behavior is normal.      Assessment & Plan:    See Problem List for Assessment and Plan of chronic medical problems.    This visit occurred during the SARS-CoV-2 public health emergency.  Safety protocols were in place, including screening questions prior to the visit, additional usage of staff PPE, and extensive cleaning of exam room while observing appropriate contact time as indicated for disinfecting solutions.

## 2020-01-20 NOTE — Patient Instructions (Addendum)
  Your a1c was checked today.    Flu immunization administered today.     Medications reviewed and updated.  Changes include :   none   A referral was ordered for [facility].      Someone from their office will call you to schedule an appointment.    Please followup in 6 months

## 2020-01-21 ENCOUNTER — Other Ambulatory Visit: Payer: Self-pay

## 2020-01-21 ENCOUNTER — Ambulatory Visit (INDEPENDENT_AMBULATORY_CARE_PROVIDER_SITE_OTHER): Payer: Medicare HMO | Admitting: Internal Medicine

## 2020-01-21 ENCOUNTER — Encounter: Payer: Self-pay | Admitting: Internal Medicine

## 2020-01-21 VITALS — BP 126/70 | HR 99 | Temp 97.9°F | Ht 67.0 in | Wt 158.0 lb

## 2020-01-21 DIAGNOSIS — E538 Deficiency of other specified B group vitamins: Secondary | ICD-10-CM | POA: Diagnosis not present

## 2020-01-21 DIAGNOSIS — E1329 Other specified diabetes mellitus with other diabetic kidney complication: Secondary | ICD-10-CM | POA: Diagnosis not present

## 2020-01-21 DIAGNOSIS — Z23 Encounter for immunization: Secondary | ICD-10-CM | POA: Diagnosis not present

## 2020-01-21 DIAGNOSIS — E785 Hyperlipidemia, unspecified: Secondary | ICD-10-CM

## 2020-01-21 DIAGNOSIS — IMO0002 Reserved for concepts with insufficient information to code with codable children: Secondary | ICD-10-CM

## 2020-01-21 DIAGNOSIS — E0843 Diabetes mellitus due to underlying condition with diabetic autonomic (poly)neuropathy: Secondary | ICD-10-CM | POA: Diagnosis not present

## 2020-01-21 DIAGNOSIS — E1365 Other specified diabetes mellitus with hyperglycemia: Secondary | ICD-10-CM | POA: Diagnosis not present

## 2020-01-21 DIAGNOSIS — N184 Chronic kidney disease, stage 4 (severe): Secondary | ICD-10-CM

## 2020-01-21 DIAGNOSIS — M109 Gout, unspecified: Secondary | ICD-10-CM | POA: Diagnosis not present

## 2020-01-21 LAB — POCT GLYCOSYLATED HEMOGLOBIN (HGB A1C)
HbA1c POC (<> result, manual entry): 5.4 % (ref 4.0–5.6)
HbA1c, POC (controlled diabetic range): 5.4 % (ref 0.0–7.0)
HbA1c, POC (prediabetic range): 5.4 % — AB (ref 5.7–6.4)
Hemoglobin A1C: 5.4 % (ref 4.0–5.6)

## 2020-01-21 NOTE — Assessment & Plan Note (Signed)
Chronic Diet controlled  Lab Results  Component Value Date   HGBA1C 5.4 01/21/2020   HGBA1C 5.4 01/21/2020   HGBA1C 5.4 (A) 01/21/2020   HGBA1C 5.4 01/21/2020   Controlled Continue low sugar/carb diet and regular exercise

## 2020-01-21 NOTE — Assessment & Plan Note (Signed)
Chronic No gout symptoms since her last visit Continue allopurinol 200 mg daily

## 2020-01-21 NOTE — Assessment & Plan Note (Signed)
Chronic Continue atorvastatin 40 mg daily  Regular exercise and healthy diet encouraged  

## 2020-01-21 NOTE — Assessment & Plan Note (Signed)
Chronic Taking vitamin B12 daily, continue

## 2020-01-21 NOTE — Assessment & Plan Note (Signed)
Chronic Not on any medications Needs to see podiatry - referred back to [facility]

## 2020-01-21 NOTE — Assessment & Plan Note (Signed)
Chronic GFR stable - following with nephology - has appt coming up so will let Dr Posey Pronto do labs

## 2020-01-23 NOTE — Addendum Note (Signed)
Addended by: Marcina Millard on: 01/23/2020 03:37 PM   Modules accepted: Orders

## 2020-01-26 ENCOUNTER — Other Ambulatory Visit: Payer: Self-pay | Admitting: Internal Medicine

## 2020-02-19 ENCOUNTER — Ambulatory Visit: Payer: Medicare HMO | Admitting: Podiatry

## 2020-03-03 ENCOUNTER — Ambulatory Visit (INDEPENDENT_AMBULATORY_CARE_PROVIDER_SITE_OTHER): Payer: Medicare HMO | Admitting: Internal Medicine

## 2020-03-03 ENCOUNTER — Encounter: Payer: Self-pay | Admitting: Internal Medicine

## 2020-03-03 ENCOUNTER — Other Ambulatory Visit: Payer: Self-pay

## 2020-03-03 VITALS — BP 116/78 | HR 107 | Temp 97.6°F | Ht 67.0 in | Wt 168.0 lb

## 2020-03-03 DIAGNOSIS — N184 Chronic kidney disease, stage 4 (severe): Secondary | ICD-10-CM

## 2020-03-03 DIAGNOSIS — E538 Deficiency of other specified B group vitamins: Secondary | ICD-10-CM | POA: Diagnosis not present

## 2020-03-03 DIAGNOSIS — D539 Nutritional anemia, unspecified: Secondary | ICD-10-CM

## 2020-03-03 LAB — FOLATE: Folate: 9.7 ng/mL (ref >5.9)

## 2020-03-03 LAB — CBC WITH DIFFERENTIAL/PLATELET
Basophils Absolute: 0 10*3/uL (ref 0.0–0.1)
Basophils Relative: 1 % (ref 0.0–3.0)
Eosinophils Absolute: 0.1 10*3/uL (ref 0.0–0.7)
Eosinophils Relative: 2.7 % (ref 0.0–5.0)
HCT: 33.4 % — ABNORMAL LOW (ref 36.0–46.0)
Hemoglobin: 10.7 g/dL — ABNORMAL LOW (ref 12.0–15.0)
Lymphocytes Relative: 38.1 % (ref 12.0–46.0)
Lymphs Abs: 1.3 10*3/uL (ref 0.7–4.0)
MCHC: 32.1 g/dL (ref 30.0–36.0)
MCV: 98.8 fl (ref 78.0–100.0)
Monocytes Absolute: 0.5 10*3/uL (ref 0.1–1.0)
Monocytes Relative: 13.7 % — ABNORMAL HIGH (ref 3.0–12.0)
Neutro Abs: 1.5 10*3/uL (ref 1.4–7.7)
Neutrophils Relative %: 44.5 % (ref 43.0–77.0)
Platelets: 101 10*3/uL — ABNORMAL LOW (ref 150.0–400.0)
RBC: 3.38 Mil/uL — ABNORMAL LOW (ref 3.87–5.11)
RDW: 17.3 % — ABNORMAL HIGH (ref 11.5–15.5)
WBC: 3.4 10*3/uL — ABNORMAL LOW (ref 4.0–10.5)

## 2020-03-03 LAB — BASIC METABOLIC PANEL
BUN: 68 mg/dL — ABNORMAL HIGH (ref 6–23)
CO2: 29 mEq/L (ref 19–32)
Calcium: 9.9 mg/dL (ref 8.4–10.5)
Chloride: 103 mEq/L (ref 96–112)
Creatinine, Ser: 2.63 mg/dL — ABNORMAL HIGH (ref 0.40–1.20)
GFR: 16.75 mL/min — ABNORMAL LOW (ref >60.00)
Glucose, Bld: 94 mg/dL (ref 70–99)
Potassium: 4.4 mEq/L (ref 3.5–5.1)
Sodium: 139 mEq/L (ref 135–145)

## 2020-03-03 LAB — IRON: Iron: 71 ug/dL (ref 42–145)

## 2020-03-03 LAB — FERRITIN: Ferritin: 129.5 ng/mL (ref 10.0–291.0)

## 2020-03-03 LAB — VITAMIN B12: Vitamin B-12: >1526 pg/mL — ABNORMAL HIGH (ref 211–911)

## 2020-03-03 NOTE — Patient Instructions (Signed)
Anemia  Anemia is a condition in which you do not have enough red blood cells or hemoglobin. Hemoglobin is a substance in red blood cells that carries oxygen. When you do not have enough red blood cells or hemoglobin (are anemic), your body cannot get enough oxygen and your organs may not work properly. As a result, you may feel very tired or have other problems. What are the causes? Common causes of anemia include:  Excessive bleeding. Anemia can be caused by excessive bleeding inside or outside the body, including bleeding from the intestine or from periods in women.  Poor nutrition.  Long-lasting (chronic) kidney, thyroid, and liver disease.  Bone marrow disorders.  Cancer and treatments for cancer.  HIV (human immunodeficiency virus) and AIDS (acquired immunodeficiency syndrome).  Treatments for HIV and AIDS.  Spleen problems.  Blood disorders.  Infections, medicines, and autoimmune disorders that destroy red blood cells. What are the signs or symptoms? Symptoms of this condition include:  Minor weakness.  Dizziness.  Headache.  Feeling heartbeats that are irregular or faster than normal (palpitations).  Shortness of breath, especially with exercise.  Paleness.  Cold sensitivity.  Indigestion.  Nausea.  Difficulty sleeping.  Difficulty concentrating. Symptoms may occur suddenly or develop slowly. If your anemia is mild, you may not have symptoms. How is this diagnosed? This condition is diagnosed based on:  Blood tests.  Your medical history.  A physical exam.  Bone marrow biopsy. Your health care provider may also check your stool (feces) for blood and may do additional testing to look for the cause of your bleeding. You may also have other tests, including:  Imaging tests, such as a CT scan or MRI.  Endoscopy.  Colonoscopy. How is this treated? Treatment for this condition depends on the cause. If you continue to lose a lot of blood, you may  need to be treated at a hospital. Treatment may include:  Taking supplements of iron, vitamin J00, or folic acid.  Taking a hormone medicine (erythropoietin) that can help to stimulate red blood cell growth.  Having a blood transfusion. This may be needed if you lose a lot of blood.  Making changes to your diet.  Having surgery to remove your spleen. Follow these instructions at home:  Take over-the-counter and prescription medicines only as told by your health care provider.  Take supplements only as told by your health care provider.  Follow any diet instructions that you were given.  Keep all follow-up visits as told by your health care provider. This is important. Contact a health care provider if:  You develop new bleeding anywhere in the body. Get help right away if:  You are very weak.  You are short of breath.  You have pain in your abdomen or chest.  You are dizzy or feel faint.  You have trouble concentrating.  You have bloody or black, tarry stools.  You vomit repeatedly or you vomit up blood. Summary  Anemia is a condition in which you do not have enough red blood cells or enough of a substance in your red blood cells that carries oxygen (hemoglobin).  Symptoms may occur suddenly or develop slowly.  If your anemia is mild, you may not have symptoms.  This condition is diagnosed with blood tests as well as a medical history and physical exam. Other tests may be needed.  Treatment for this condition depends on the cause of the anemia. This information is not intended to replace advice given to you by  your health care provider. Make sure you discuss any questions you have with your health care provider. Document Revised: 02/04/2017 Document Reviewed: 03/26/2016 Elsevier Patient Education  [place].

## 2020-03-03 NOTE — Progress Notes (Signed)
Subjective:  Patient ID: Erin Liu, female    DOB: Apr 20, 1940  Age: 79 y.o. MRN: [id number]  CC: Anemia  This visit occurred during the SARS-CoV-2 public health emergency.  Safety protocols were in place, including screening questions prior to the visit, additional usage of staff PPE, and extensive cleaning of exam room while observing appropriate contact time as indicated for disinfecting solutions.    HPI Erin Liu presents for f/up -she lives with her son.  Her daughter brings her in today because the son has said he thinks she is more fatigued than usual.  She said she just likes to lay around and watch TV.  She denies cough, fever, chills, chest pain, shortness of breath, edema, fatigue, or paresthesias.  She tells me she is taking the oral B12 supplement.  Outpatient Medications Prior to Visit  Medication Sig Dispense Refill  . allopurinol (ZYLOPRIM) 100 MG tablet TAKE 2 TABLETS BY MOUTH DAILY 180 tablet 0  . atorvastatin (LIPITOR) 40 MG tablet TAKE 1 TABLET BY MOUTH DAILY 90 tablet 1  . Cyanocobalamin (VITAMIN B-12) 2000 MCG TBCR Take 1 tablet (2,000 mcg total) by mouth daily. 90 tablet 1  . furosemide (LASIX) 20 MG tablet Take 20 mg by mouth every other day.     . latanoprost (XALATAN) 0.005 % ophthalmic solution Place 1 drop into both eyes at bedtime.  3   No facility-administered medications prior to visit.    ROS Review of Systems  Constitutional: Negative for chills, diaphoresis, fatigue, fever and unexpected weight change.  HENT: Negative.   Eyes: Negative for visual disturbance.  Respiratory: Negative for cough, chest tightness, shortness of breath and wheezing.   Cardiovascular: Negative for chest pain, palpitations and leg swelling.  Gastrointestinal: Negative for abdominal pain, diarrhea, nausea and vomiting.  Endocrine: Negative.   Genitourinary: Negative.  Negative for difficulty urinating.  Musculoskeletal: Negative for back pain.  Skin: Positive  for pallor. Negative for color change.  Neurological: Negative.  Negative for dizziness, weakness and light-headedness.  Hematological: Negative for adenopathy. Does not bruise/bleed easily.  Psychiatric/Behavioral: Negative.     Objective:  BP 116/78   Pulse (!) 107   Temp 97.6 F (36.4 C) (Oral)   Ht 5\' 7"  (1.702 m)   Wt 168 lb (76.2 kg)   SpO2 97%   BMI 26.31 kg/m   BP Readings from Last 3 Encounters:  03/03/20 116/78  01/21/20 126/70  09/12/19 132/70    Wt Readings from Last 3 Encounters:  03/03/20 168 lb (76.2 kg)  01/21/20 158 lb (71.7 kg)  09/12/19 152 lb 12.8 oz (69.3 kg)    Physical Exam Vitals reviewed.  Constitutional:      General: She is not in acute distress.    Appearance: She is ill-appearing (in a wheelchair). She is not toxic-appearing or diaphoretic.  HENT:     Nose: Nose normal.     Mouth/Throat:     Mouth: Mucous membranes are moist. Mucous membranes are pale.  Eyes:     General: No scleral icterus.    Conjunctiva/sclera: Conjunctivae normal.  Cardiovascular:     Rate and Rhythm: Regular rhythm. Tachycardia present.     Heart sounds: Murmur heard.   Systolic murmur is present with a grade of 1/6.  No diastolic murmur is present. No gallop.   Pulmonary:     Effort: Pulmonary effort is normal.     Breath sounds: No stridor. No wheezing, rhonchi or rales.  Abdominal:  General: Abdomen is protuberant. Bowel sounds are normal. There is no distension.     Palpations: Abdomen is soft. There is no hepatomegaly, splenomegaly or mass.     Tenderness: There is no abdominal tenderness.  Musculoskeletal:        General: Normal range of motion.     Cervical back: Neck supple.     Right lower leg: No edema.     Left lower leg: No edema.  Lymphadenopathy:     Cervical: No cervical adenopathy.  Skin:    General: Skin is warm and dry.  Neurological:     General: No focal deficit present.     Mental Status: She is alert. Mental status is at  baseline.  Psychiatric:        Behavior: Behavior normal.     Lab Results  Component Value Date   WBC 3.4 (L) 03/03/2020   HGB 10.7 (L) 03/03/2020   HCT 33.4 (L) 03/03/2020   PLT 101.0 Repeated and verified X2. (L) 03/03/2020   GLUCOSE 94 03/03/2020   CHOL 208 (H) 07/18/2019   TRIG 76.0 07/18/2019   HDL 70.90 07/18/2019   LDLDIRECT 230.5 09/11/2010   LDLCALC 122 (H) 07/18/2019   ALT 20 09/12/2019   AST 31 09/12/2019   NA 139 03/03/2020   K 4.4 03/03/2020   CL 103 03/03/2020   CREATININE 2.63 (H) 03/03/2020   BUN 68 (H) 03/03/2020   CO2 29 03/03/2020   TSH 2.39 07/18/2019   HGBA1C 5.4 01/21/2020   HGBA1C 5.4 01/21/2020   HGBA1C 5.4 (A) 01/21/2020   HGBA1C 5.4 01/21/2020   MICROALBUR 5.3 (H) 02/25/2015    No results found.  Assessment & Plan:   Erin Liu was seen today for anemia.  Diagnoses and all orders for this visit:  Chronic kidney disease (CKD), stage IV (severe) ([place])- Her renal function is stable. 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  Her B12 level is supratherapeutic.  Her folate level is normal. 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  I do not think this is contributing to her symptoms.  Will screen for vitamin deficiencies.  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 -     CBC with Differential/Platelet; Future -     Vitamin B12; Future -     Iron; Future -     Vitamin B1; Future -     Ferritin; Future -     Folate; Future -     Folate -     Ferritin -     Vitamin B1 -     Iron -     Vitamin B12 -     CBC with Differential/Platelet   I am having Erin Liu maintain her latanoprost, Vitamin B-12, furosemide, atorvastatin, and allopurinol.  No orders of the defined types were placed in this  encounter.    Follow-up: Return in about 3 months (around 06/01/2020).  Erin Calico, MD

## 2020-03-04 ENCOUNTER — Ambulatory Visit: Payer: Medicare HMO | Admitting: Podiatry

## 2020-03-04 DIAGNOSIS — M79675 Pain in left toe(s): Secondary | ICD-10-CM

## 2020-03-04 DIAGNOSIS — M79674 Pain in right toe(s): Secondary | ICD-10-CM

## 2020-03-04 DIAGNOSIS — B351 Tinea unguium: Secondary | ICD-10-CM

## 2020-03-04 NOTE — Progress Notes (Signed)
  Subjective:  Patient ID: Erin Liu, female    DOB: October 19, 1940,  MRN: [id number]  Chief Complaint  Patient presents with  . diabetic foot care    Routine foot care. Nail trim    79 y.o. female presents with the above complaint. History confirmed with patient.  She is no longer considered diabetic and is off diabetic medications.  Nails have become thickened elongated and discomfort.  Objective:  Physical Exam: warm, good capillary refill, no trophic changes or ulcerative lesions, normal DP and PT pulses and normal sensory exam.  Onychomycosis x10 with elongated, thickened, brown and yellow discolored toenails with crumbling a subungual debris.  Discomfort upon palpation. Assessment:   1. Onychomycosis   2. Pain due to onychomycosis of toenails of both feet      Plan:  Patient was evaluated and treated and all questions answered.  Discussed the etiology and treatment options for the condition in detail with the patient. Educated patient on the topical and oral treatment options for mycotic nails. Recommended debridement of the nails today. Sharp and mechanical debridement performed of all painful and mycotic nails today. Nails debrided in length and thickness using a nail nipper and a mechanical burr to level of comfort. Discussed treatment options including appropriate shoe gear. Follow up as needed for painful nails.    Return in about 3 months (around 06/02/2020) for nail fungus.

## 2020-03-10 LAB — VITAMIN B1: Vitamin B1 (Thiamine): 10 nmol/L (ref 8–30)

## 2020-04-04 ENCOUNTER — Telehealth: Payer: Self-pay | Admitting: Internal Medicine

## 2020-04-04 ENCOUNTER — Other Ambulatory Visit: Payer: Self-pay

## 2020-04-04 ENCOUNTER — Encounter (HOSPITAL_COMMUNITY): Payer: Self-pay

## 2020-04-04 ENCOUNTER — Ambulatory Visit (HOSPITAL_COMMUNITY)
Admission: EM | Admit: 2020-04-04 | Discharge: 2020-04-04 | Disposition: A | Discharge status: Home / Self Care | Payer: Medicare HMO | Attending: Medical Oncology | Admitting: Medical Oncology

## 2020-04-04 DIAGNOSIS — R630 Anorexia: Secondary | ICD-10-CM | POA: Insufficient documentation

## 2020-04-04 DIAGNOSIS — Z20822 Contact with and (suspected) exposure to covid-19: Secondary | ICD-10-CM | POA: Diagnosis not present

## 2020-04-04 DIAGNOSIS — R197 Diarrhea, unspecified: Secondary | ICD-10-CM | POA: Diagnosis not present

## 2020-04-04 DIAGNOSIS — Z79899 Other long term (current) drug therapy: Secondary | ICD-10-CM | POA: Diagnosis not present

## 2020-04-04 DIAGNOSIS — Z6826 Body mass index (BMI) 26.0-26.9, adult: Secondary | ICD-10-CM | POA: Diagnosis not present

## 2020-04-04 DIAGNOSIS — Z888 Allergy status to other drugs, medicaments and biological substances status: Secondary | ICD-10-CM | POA: Diagnosis not present

## 2020-04-04 NOTE — ED Provider Notes (Signed)
[facility]    CSN: [id number] Arrival date & time: 04/04/20  1431      History   Chief Complaint Chief Complaint  Patient presents with  . Diarrhea    HPI Erin Liu is a 80 y.o. female.   HPI   Diarrhea: Patient is accompanied by her daughter who helps with history.  She states that mother had 1 episode of diarrhea today.  The diarrhea was nonbloody.  She believes that her last bowel movement prior to this episode was about 2 days ago which is not unusual for patient.  Patient does not have any abdominal pain, dysuria, fever.  The only other factor involved is that she has not eaten in 3 days according to daughter.  According to daughter her mother will eat if she is given food but has not felt like getting up to make food on her own.  According to daughter this has happened before but not usually for this amount of time and she has a plan in place to help avoid recurrence in the future.  She has been able to stay well hydrated with liquids. NO mental status changes.  She does stay up-to-date with her primary care provider.   Past Medical History:  Diagnosis Date  . ANEMIA, NORMOCYTIC   . Arthritis    back  . BREAST CYST, HX OF   . CARPAL TUNNEL SYNDROME, BILATERAL   . DIABETES MELLITUS, TYPE II   . Glaucoma   . HYPERLIPIDEMIA   . HYPERTENSION   . OBESITY   . PERIPHERAL NEUROPATHY   . RENAL FAILURE, CHRONIC    Stage 4    Patient Active Problem List   Diagnosis Date Noted  . Weight loss, unintentional 09/11/2019  . Rash 07/18/2019  . Memory disorder 07/18/2019  . Hand tingling 07/24/2018  . Physical deconditioning 01/13/2018  . Tremor of both hands 10/10/2017  . Urinary incontinence 10/10/2017  . Vasovagal syncope 10/02/2017  . Gout 07/12/2017  . B12 deficiency 04/11/2017  . Cough 08/26/2015  . Lumbago 02/25/2015  . Essential tremor   . Glaucoma 01/08/2010  . Chronic kidney disease (CKD), stage IV (severe) ([place]) 12/10/2008  . DM (diabetes  mellitus), secondary, uncontrolled, with renal complications ([place]) 56/31/4970  . Diabetic neuropathy ([place]) 02/22/2006  . Dyslipidemia 02/22/2006  . 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 CARPAL TUNNEL SYNDROME, BILATERAL 02/22/2006    Past Surgical History:  Procedure Laterality Date  . AV FISTULA PLACEMENT Left 05/23/2018   Procedure: ARTERIOVENOUS (AV) FISTULA CREATION LEFT ARM;  Surgeon: Waynetta Sandy, MD;  Location: [facility];  Service: Vascular;  Laterality: Left;  . BASCILIC VEIN TRANSPOSITION Left 08/03/2018   Procedure: SECOND STAGE BASILIC VEIN TRANSPOSITION LEFT ARM;  Surgeon: Waynetta Sandy, MD;  Location: [facility];  Service: Vascular;  Laterality: Left;  . CATARACT EXTRACTION BILATERAL W/ ANTERIOR VITRECTOMY  05/2010  . COLONOSCOPY     [facility]/7-8 years    OB History   No obstetric history on file.      Home Medications    Prior to Admission medications   Medication Sig Start Date End Date Taking? Authorizing Provider  atorvastatin (LIPITOR) 40 MG tablet TAKE 1 TABLET BY MOUTH DAILY 11/05/19  Yes Burns, Claudina Lick, MD  furosemide (LASIX) 20 MG tablet Take 20 mg by mouth every other day.    Yes [provider]  allopurinol (ZYLOPRIM) 100 MG tablet TAKE 2 TABLETS BY MOUTH DAILY 01/28/20   Binnie Rail, MD  Cyanocobalamin (  VITAMIN B-12) 2000 MCG TBCR Take 1 tablet (2,000 mcg total) by mouth daily. 07/24/18   Burns, Claudina Lick, MD  latanoprost (XALATAN) 0.005 % ophthalmic solution Place 1 drop into both eyes at bedtime. 12/09/17   [provider]    Family History Family History  Problem Relation Age of Onset  . Arthritis Son   . Alcohol abuse Son   . Diabetes Son   . Hypertension Son   . Colon cancer Son 60  . Heart attack Mother        Died age 77s  . Heart attack Father        No details 51s  . Heart disease Sister        Slight heart attack, died age 90    Social History Social History   Tobacco Use  . Smoking status: Never Smoker  .  Smokeless tobacco: Never Used  Vaping Use  . Vaping Use: Never used  Substance Use Topics  . Alcohol use: No  . Drug use: No     Allergies   Lisinopril   Review of Systems Review of Systems  As stated above in HPI  Physical Exam Triage Vital Signs ED Triage Vitals  Enc Vitals Group     BP 04/04/20 1504 123/69     Pulse Rate 04/04/20 1504 95     Resp 04/04/20 1504 20     Temp 04/04/20 1504 97.7 F (36.5 C)     Temp Source 04/04/20 1504 Oral     SpO2 04/04/20 1504 98 %     Weight 04/04/20 1510 160 lb (72.6 kg)     Height 04/04/20 1510 5\' 5"  (1.651 m)     Head Circumference --      Peak Flow --      Pain Score 04/04/20 1503 0     Pain Loc --      Pain Edu? --      Excl. in [place]? --    No data found.  Updated Vital Signs BP 123/69 (BP Location: Right Arm)   Pulse 95   Temp 97.7 F (36.5 C) (Oral)   Resp 20   Ht 5\' 5"  (1.651 m)   Wt 160 lb (72.6 kg)   SpO2 98%   BMI 26.63 kg/m   Physical Exam Vitals and nursing note reviewed.  Constitutional:      General: She is not in acute distress.    Appearance: Normal appearance. She is not ill-appearing, toxic-appearing or diaphoretic.  HENT:     Head: Normocephalic.     Mouth/Throat:     Mouth: Mucous membranes are moist.  Eyes:     Comments: Mild pallor  Cardiovascular:     Rate and Rhythm: Normal rate.     Pulses: Normal pulses.     Heart sounds: Normal heart sounds.  Pulmonary:     Effort: Pulmonary effort is normal.     Breath sounds: Normal breath sounds.  Abdominal:     General: Abdomen is flat. Bowel sounds are normal. There is no distension.     Palpations: Abdomen is soft. There is no mass.     Tenderness: There is no abdominal tenderness. There is no guarding.     Hernia: No hernia is present.  Lymphadenopathy:     Cervical: No cervical adenopathy.  Skin:    General: Skin is warm and dry.     Coloration: Skin is not pale.  Neurological:     Mental Status: She is alert.  UC Treatments  / Results  Labs (all labs ordered are listed, but only abnormal results are displayed) Labs Reviewed  SARS CORONAVIRUS 2 (TAT 6-24 HRS)    Radiology No results found.  Procedures Procedures (including critical care time)  Medications Ordered in UC Medications - No data to display  Initial Impression / Assessment and Plan / UC Course  I have reviewed the triage vital signs and the nursing notes.  Pertinent labs & imaging results that were available during my care of the patient were reviewed by me and considered in my medical decision making (see chart for details).     New. OOX4 but prefers daughter to answer questions.  Given loss of appetite and diarrhea we are going to screen for COVID-19 infection.  For now I have recommended Ensure and continuation of hydration.  Should she have an appetite she should be encouraged to eat especially healthy balanced foods.  We discussed red flag symptoms that would warrant further attention and evaluation.  Reviewed her recent CBC which was 10.7.  She should also be encouraged to have iron rich foods and schedule a follow-up with her primary care provider for next week. Final Clinical Impressions(s) / UC Diagnoses   Final diagnoses:  Loss of appetite  Diarrhea, unspecified type   Discharge Instructions   None    ED Prescriptions    None     PDMP not reviewed this encounter.   Hughie Closs, [place] 04/04/20 1628

## 2020-04-04 NOTE — ED Triage Notes (Signed)
Pt c/o diarrhea episode onset today in which pt could not get to the bathroom in a timely manner. Reports not eating for past several days. Daughter also reports pt with dry cough for past several days but states pt "always gets a dry cough".  Pt denies CP, SOB, sore throat, runny nose, congestion, n/v, change in mentation, or urine quality/frequency.  Pt in wheelchair 2/2 preference to not walk long distances with cane.  Pt somnolent at times, oriented to place and circumstance. [name] daughter reports pt sleeps frequently throughout the day and spends a large amount of her time in bed 2/2 "being bored".  Daughter reports pt tolerating po fluids today. Per L. Phillip Heal, [place] pt can be initially evaluated here.

## 2020-04-04 NOTE — Telephone Encounter (Signed)
Currently in ED

## 2020-04-04 NOTE — Telephone Encounter (Signed)
Patients daughter [name] calling, states the patient hasn't ate in a few days and is having uncontrollable bowels. Transferred to [facility] for further eval

## 2020-04-05 LAB — SARS CORONAVIRUS 2 (TAT 6-24 HRS): SARS Coronavirus 2: NEGATIVE

## 2020-04-22 ENCOUNTER — Encounter: Payer: Self-pay | Admitting: Internal Medicine

## 2020-04-25 ENCOUNTER — Encounter: Payer: Self-pay | Admitting: Internal Medicine

## 2020-04-27 NOTE — Progress Notes (Signed)
Subjective:    Patient ID: Erin Liu, female    DOB: [birth year], 80 y.o.   MRN: [id number]  HPI The patient is here for an acute visit.  She is here with her daughter.     She fell last week.  She reached over to ger her water bottle and fell.  She fell another time last week - she said the room was dark.  She cut her lip.  Her family put a carpet in the bedroom and turned a light on for nighttime.    Her daughter helps her with showers.  She just lays in the bed most days- often may not get up.  She will get dressed if she gets up, but may not put on her clothes correctly ( bra was put on backward).  She goes to the bathroom on her own.  She can heat something up in the microwave. She has gained weight so is eating.  She lives with her son, who is on disability.    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  When the home nurse came she was not able to draw a clock.  Her daughter wonders about medication.    Chronic back pain - can not stand long    Medications and allergies reviewed with patient and updated if appropriate.  Patient Active Problem List   Diagnosis Date Noted  . Weight loss, unintentional 09/11/2019  . Rash 07/18/2019  . Memory disorder 07/18/2019  . Hand tingling 07/24/2018  . Physical deconditioning 01/13/2018  . Tremor of both hands 10/10/2017  . Urinary incontinence 10/10/2017  . Vasovagal syncope 10/02/2017  . Gout 07/12/2017  . B12 deficiency 04/11/2017  . Cough 08/26/2015  . Lumbago 02/25/2015  . Essential tremor   . Glaucoma 01/08/2010  . Chronic kidney disease (CKD), stage IV (severe) ([place]) 12/10/2008  . DM (diabetes mellitus), secondary, uncontrolled, with renal complications ([place]) 71/24/5809  . Diabetic neuropathy ([place]) 02/22/2006  . Dyslipidemia 02/22/2006  . 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 CARPAL TUNNEL SYNDROME, BILATERAL 02/22/2006    Current Outpatient Medications on File Prior to Visit  Medication Sig Dispense Refill  .  allopurinol (ZYLOPRIM) 100 MG tablet TAKE 2 TABLETS BY MOUTH DAILY 180 tablet 0  . atorvastatin (LIPITOR) 40 MG tablet TAKE 1 TABLET BY MOUTH DAILY 90 tablet 1  . Cyanocobalamin (VITAMIN B-12) 2000 MCG TBCR Take 1 tablet (2,000 mcg total) by mouth daily. 90 tablet 1  . furosemide (LASIX) 40 MG tablet Take 40 mg by mouth daily.    Marland Kitchen latanoprost (XALATAN) 0.005 % ophthalmic solution Place 1 drop into both eyes at bedtime.  3  . furosemide (LASIX) 20 MG tablet Take 20 mg by mouth every other day.  (Patient not taking: Reported on 04/28/2020)     No current facility-administered medications on file prior to visit.    Past Medical History:  Diagnosis Date  . ANEMIA, NORMOCYTIC   . Arthritis    back  . BREAST CYST, HX OF   . CARPAL TUNNEL SYNDROME, BILATERAL   . DIABETES MELLITUS, TYPE II   . Glaucoma   . HYPERLIPIDEMIA   . HYPERTENSION   . OBESITY   . PERIPHERAL NEUROPATHY   . RENAL FAILURE, CHRONIC    Stage 4    Past Surgical History:  Procedure Laterality Date  . AV FISTULA PLACEMENT Left 05/23/2018   Procedure: ARTERIOVENOUS (AV) FISTULA CREATION LEFT ARM;  Surgeon: Waynetta Sandy, MD;  Location: [facility];  Service: Vascular;  Laterality: Left;  . BASCILIC VEIN TRANSPOSITION Left 08/03/2018   Procedure: SECOND STAGE BASILIC VEIN TRANSPOSITION LEFT ARM;  Surgeon: Waynetta Sandy, MD;  Location: [facility];  Service: Vascular;  Laterality: Left;  . CATARACT EXTRACTION BILATERAL W/ ANTERIOR VITRECTOMY  05/2010  . COLONOSCOPY     [facility]/7-8 years    Social History   Socioeconomic History  . Marital status: Widowed    Spouse name: Not on file  . Number of children: 3  . Years of education: Not on file  . Highest education level: Not on file  Occupational History  . Not on file  Tobacco Use  . Smoking status: Never Smoker  . Smokeless tobacco: Never Used  Vaping Use  . Vaping Use: Never used  Substance and Sexual Activity  . Alcohol use: No  . Drug use: No  .  Sexual activity: Not Currently  Other Topics Concern  . Not on file  Social History Narrative   Widowed, lives with son. Retired Medical illustrator   Social Determinants of Radio broadcast assistant Strain: [place]   . Difficulty of Paying Living Expenses: Not hard at all  Food Insecurity: No Food Insecurity  . Worried About Charity fundraiser in the Last Year: Never true  . Ran Out of Food in the Last Year: Never true  Transportation Needs: No Transportation Needs  . Lack of Transportation (Medical): No  . Lack of Transportation (Non-Medical): No  Physical Activity: Inactive  . Days of Exercise per Week: 0 days  . Minutes of Exercise per Session: 0 min  Stress: No Stress Concern Present  . Feeling of Stress : Not at all  Social Connections: Unknown  . Frequency of Communication with Friends and Family: More than three times a week  . Frequency of Social Gatherings with Friends and Family: Once a week  . Attends Religious Services: Patient refused  . Active Member of Clubs or Organizations: Patient refused  . Attends Archivist Meetings: Patient refused  . Marital Status: Widowed    Family History  Problem Relation Age of Onset  . Arthritis Son   . Alcohol abuse Son   . Diabetes Son   . Hypertension Son   . Colon cancer Son 60  . Heart attack Mother        Died age 15s  . Heart attack Father        No details 81s  . Heart disease Sister        Slight heart attack, died age 80    Review of Systems  Constitutional: Negative for fever.  Respiratory: Negative for cough, shortness of breath and wheezing.   Cardiovascular: Negative for chest pain, palpitations and leg swelling.  Neurological: Negative for dizziness, light-headedness and headaches.       Objective:   Vitals:   04/28/20 1417  BP: 114/64  Pulse: 93  Temp: 98.2 F (36.8 C)  SpO2: 98%   BP Readings from Last 3 Encounters:  04/28/20 114/64  04/04/20 123/69  03/03/20 116/78   Wt  Readings from Last 3 Encounters:  04/28/20 174 lb (78.9 kg)  04/04/20 160 lb (72.6 kg)  03/03/20 168 lb (76.2 kg)   Body mass index is 28.96 kg/m.   Physical Exam    Constitutional: Appears well-developed and well-nourished. No distress.  Head: Normocephalic and atraumatic.  Neck: Neck supple. No tracheal deviation present. No thyromegaly present.  No cervical lymphadenopathy Cardiovascular: Normal rate, regular rhythm  and normal heart sounds.  No murmur heard. No carotid bruit .  Trace b/l LE edema Pulmonary/Chest: Effort normal and breath sounds normal. No respiratory distress. No has no wheezes. No rales.  Skin: Skin is warm and dry. Not diaphoretic.  Psychiatric: Normal mood and affect. Behavior is normal.       Assessment & Plan:    See Problem List for Assessment and Plan of chronic medical problems.    This visit occurred during the SARS-CoV-2 public health emergency.  Safety protocols were in place, including screening questions prior to the visit, additional usage of staff PPE, and extensive cleaning of exam room while observing appropriate contact time as indicated for disinfecting solutions.

## 2020-04-27 NOTE — Patient Instructions (Signed)
  Medications changes include :   Start aricept 5 mg at bedtime.   Your prescription(s) have been submitted to your pharmacy. Please take as directed and contact our office if you believe you are having problem(s) with the medication(s).   A referral was ordered for home health PT.     Someone from their office will call you to schedule an appointment.    Please followup in 3 months

## 2020-04-28 ENCOUNTER — Other Ambulatory Visit: Payer: Self-pay

## 2020-04-28 ENCOUNTER — Ambulatory Visit (INDEPENDENT_AMBULATORY_CARE_PROVIDER_SITE_OTHER): Payer: Medicare HMO | Admitting: Internal Medicine

## 2020-04-28 ENCOUNTER — Encounter: Payer: Self-pay | Admitting: Internal Medicine

## 2020-04-28 VITALS — BP 114/64 | HR 93 | Temp 98.2°F | Ht 65.0 in | Wt 174.0 lb

## 2020-04-28 DIAGNOSIS — R5381 Other malaise: Secondary | ICD-10-CM

## 2020-04-28 DIAGNOSIS — E1329 Other specified diabetes mellitus with other diabetic kidney complication: Secondary | ICD-10-CM | POA: Diagnosis not present

## 2020-04-28 DIAGNOSIS — R296 Repeated falls: Secondary | ICD-10-CM | POA: Diagnosis not present

## 2020-04-28 DIAGNOSIS — N184 Chronic kidney disease, stage 4 (severe): Secondary | ICD-10-CM | POA: Diagnosis not present

## 2020-04-28 DIAGNOSIS — IMO0002 Reserved for concepts with insufficient information to code with codable children: Secondary | ICD-10-CM

## 2020-04-28 DIAGNOSIS — R413 Other amnesia: Secondary | ICD-10-CM

## 2020-04-28 DIAGNOSIS — E1365 Other specified diabetes mellitus with hyperglycemia: Secondary | ICD-10-CM | POA: Diagnosis not present

## 2020-04-28 DIAGNOSIS — E0843 Diabetes mellitus due to underlying condition with diabetic autonomic (poly)neuropathy: Secondary | ICD-10-CM | POA: Diagnosis not present

## 2020-04-28 MED ORDER — DONEPEZIL HCL 5 MG PO TABS: 5.0000 mg | ORAL_TABLET | Freq: Every day | ORAL | 5 refills | Status: DC

## 2020-04-28 NOTE — Assessment & Plan Note (Signed)
Chronic Advised f/u with Dr Posey Pronto Will recheck labs at next visit

## 2020-04-28 NOTE — Assessment & Plan Note (Signed)
Chronic May be contributing to some of her falls, balance issues Following with podiatry

## 2020-04-28 NOTE — Assessment & Plan Note (Signed)
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

## 2020-04-28 NOTE — Assessment & Plan Note (Signed)
Chronic Not very active Has had recurrent falls Will refer to home PT

## 2020-04-28 NOTE — Assessment & Plan Note (Signed)
Chronic Not very active Has had recurrent falls at home - mild injuries Will refer to home PT

## 2020-04-28 NOTE — Assessment & Plan Note (Signed)
Chronic  Lab Results  Component Value Date   HGBA1C 5.4 01/21/2020   HGBA1C 5.4 01/21/2020   HGBA1C 5.4 (A) 01/21/2020   HGBA1C 5.4 01/21/2020   Diet controlled

## 2020-04-30 ENCOUNTER — Encounter: Payer: Self-pay | Admitting: Internal Medicine

## 2020-05-02 ENCOUNTER — Telehealth: Payer: Self-pay | Admitting: Internal Medicine

## 2020-05-02 NOTE — Telephone Encounter (Signed)
[facility] calling to report delay on start of care due to staffing  Plans to see patient week of 2/28

## 2020-05-04 ENCOUNTER — Encounter: Payer: Self-pay | Admitting: Internal Medicine

## 2020-05-06 DIAGNOSIS — N2581 Secondary hyperparathyroidism of renal origin: Secondary | ICD-10-CM | POA: Diagnosis not present

## 2020-05-06 DIAGNOSIS — I129 Hypertensive chronic kidney disease with stage 1 through stage 4 chronic kidney disease, or unspecified chronic kidney disease: Secondary | ICD-10-CM | POA: Diagnosis not present

## 2020-05-06 DIAGNOSIS — N184 Chronic kidney disease, stage 4 (severe): Secondary | ICD-10-CM | POA: Diagnosis not present

## 2020-05-06 DIAGNOSIS — D631 Anemia in chronic kidney disease: Secondary | ICD-10-CM | POA: Diagnosis not present

## 2020-05-06 DIAGNOSIS — N189 Chronic kidney disease, unspecified: Secondary | ICD-10-CM | POA: Diagnosis not present

## 2020-05-12 ENCOUNTER — Other Ambulatory Visit: Payer: Self-pay | Admitting: Internal Medicine

## 2020-05-14 ENCOUNTER — Inpatient Hospital Stay (HOSPITAL_COMMUNITY)
Admission: EM | Admit: 2020-05-14 | Discharge: 2020-05-22 | DRG: 871 | Disposition: A | Discharge status: Home Health | Payer: Medicare HMO | Attending: Internal Medicine | Admitting: Internal Medicine

## 2020-05-14 ENCOUNTER — Emergency Department (HOSPITAL_COMMUNITY): Payer: Medicare HMO

## 2020-05-14 ENCOUNTER — Encounter (HOSPITAL_COMMUNITY): Payer: Self-pay | Admitting: Emergency Medicine

## 2020-05-14 DIAGNOSIS — Z833 Family history of diabetes mellitus: Secondary | ICD-10-CM

## 2020-05-14 DIAGNOSIS — Z043 Encounter for examination and observation following other accident: Secondary | ICD-10-CM | POA: Diagnosis not present

## 2020-05-14 DIAGNOSIS — I340 Nonrheumatic mitral (valve) insufficiency: Secondary | ICD-10-CM | POA: Diagnosis not present

## 2020-05-14 DIAGNOSIS — G9340 Encephalopathy, unspecified: Secondary | ICD-10-CM | POA: Diagnosis not present

## 2020-05-14 DIAGNOSIS — R6521 Severe sepsis with septic shock: Secondary | ICD-10-CM | POA: Diagnosis present

## 2020-05-14 DIAGNOSIS — A419 Sepsis, unspecified organism: Principal | ICD-10-CM | POA: Diagnosis present

## 2020-05-14 DIAGNOSIS — E1142 Type 2 diabetes mellitus with diabetic polyneuropathy: Secondary | ICD-10-CM | POA: Diagnosis present

## 2020-05-14 DIAGNOSIS — R570 Cardiogenic shock: Secondary | ICD-10-CM | POA: Diagnosis present

## 2020-05-14 DIAGNOSIS — R4020 Unspecified coma: Secondary | ICD-10-CM | POA: Diagnosis not present

## 2020-05-14 DIAGNOSIS — N189 Chronic kidney disease, unspecified: Secondary | ICD-10-CM

## 2020-05-14 DIAGNOSIS — J9811 Atelectasis: Secondary | ICD-10-CM | POA: Diagnosis not present

## 2020-05-14 DIAGNOSIS — F0390 Unspecified dementia without behavioral disturbance: Secondary | ICD-10-CM | POA: Diagnosis present

## 2020-05-14 DIAGNOSIS — Z7401 Bed confinement status: Secondary | ICD-10-CM | POA: Diagnosis not present

## 2020-05-14 DIAGNOSIS — R040 Epistaxis: Secondary | ICD-10-CM | POA: Diagnosis not present

## 2020-05-14 DIAGNOSIS — I248 Other forms of acute ischemic heart disease: Secondary | ICD-10-CM | POA: Diagnosis not present

## 2020-05-14 DIAGNOSIS — I214 Non-ST elevation (NSTEMI) myocardial infarction: Secondary | ICD-10-CM | POA: Diagnosis present

## 2020-05-14 DIAGNOSIS — Z20822 Contact with and (suspected) exposure to covid-19: Secondary | ICD-10-CM | POA: Diagnosis present

## 2020-05-14 DIAGNOSIS — T68XXXA Hypothermia, initial encounter: Secondary | ICD-10-CM

## 2020-05-14 DIAGNOSIS — J90 Pleural effusion, not elsewhere classified: Secondary | ICD-10-CM | POA: Diagnosis not present

## 2020-05-14 DIAGNOSIS — I440 Atrioventricular block, first degree: Secondary | ICD-10-CM | POA: Diagnosis present

## 2020-05-14 DIAGNOSIS — E861 Hypovolemia: Secondary | ICD-10-CM | POA: Diagnosis not present

## 2020-05-14 DIAGNOSIS — W19XXXA Unspecified fall, initial encounter: Secondary | ICD-10-CM | POA: Diagnosis present

## 2020-05-14 DIAGNOSIS — R34 Anuria and oliguria: Secondary | ICD-10-CM | POA: Diagnosis present

## 2020-05-14 DIAGNOSIS — I951 Orthostatic hypotension: Secondary | ICD-10-CM | POA: Diagnosis not present

## 2020-05-14 DIAGNOSIS — I5082 Biventricular heart failure: Secondary | ICD-10-CM | POA: Diagnosis present

## 2020-05-14 DIAGNOSIS — R6520 Severe sepsis without septic shock: Secondary | ICD-10-CM | POA: Diagnosis not present

## 2020-05-14 DIAGNOSIS — E1122 Type 2 diabetes mellitus with diabetic chronic kidney disease: Secondary | ICD-10-CM | POA: Diagnosis present

## 2020-05-14 DIAGNOSIS — I10 Essential (primary) hypertension: Secondary | ICD-10-CM | POA: Diagnosis not present

## 2020-05-14 DIAGNOSIS — R5381 Other malaise: Secondary | ICD-10-CM | POA: Diagnosis not present

## 2020-05-14 DIAGNOSIS — E785 Hyperlipidemia, unspecified: Secondary | ICD-10-CM | POA: Diagnosis present

## 2020-05-14 DIAGNOSIS — N184 Chronic kidney disease, stage 4 (severe): Secondary | ICD-10-CM | POA: Diagnosis present

## 2020-05-14 DIAGNOSIS — I130 Hypertensive heart and chronic kidney disease with heart failure and stage 1 through stage 4 chronic kidney disease, or unspecified chronic kidney disease: Secondary | ICD-10-CM | POA: Diagnosis present

## 2020-05-14 DIAGNOSIS — N271 Small kidney, bilateral: Secondary | ICD-10-CM | POA: Diagnosis present

## 2020-05-14 DIAGNOSIS — I361 Nonrheumatic tricuspid (valve) insufficiency: Secondary | ICD-10-CM | POA: Diagnosis not present

## 2020-05-14 DIAGNOSIS — N179 Acute kidney failure, unspecified: Secondary | ICD-10-CM

## 2020-05-14 DIAGNOSIS — E669 Obesity, unspecified: Secondary | ICD-10-CM | POA: Diagnosis present

## 2020-05-14 DIAGNOSIS — R0689 Other abnormalities of breathing: Secondary | ICD-10-CM | POA: Diagnosis not present

## 2020-05-14 DIAGNOSIS — J9601 Acute respiratory failure with hypoxia: Secondary | ICD-10-CM | POA: Diagnosis present

## 2020-05-14 DIAGNOSIS — R9431 Abnormal electrocardiogram [ECG] [EKG]: Secondary | ICD-10-CM | POA: Diagnosis not present

## 2020-05-14 DIAGNOSIS — E872 Acidosis, unspecified: Secondary | ICD-10-CM

## 2020-05-14 DIAGNOSIS — R7989 Other specified abnormal findings of blood chemistry: Secondary | ICD-10-CM

## 2020-05-14 DIAGNOSIS — I5021 Acute systolic (congestive) heart failure: Secondary | ICD-10-CM | POA: Diagnosis not present

## 2020-05-14 DIAGNOSIS — J189 Pneumonia, unspecified organism: Secondary | ICD-10-CM | POA: Diagnosis present

## 2020-05-14 DIAGNOSIS — Z6831 Body mass index (BMI) 31.0-31.9, adult: Secondary | ICD-10-CM

## 2020-05-14 DIAGNOSIS — I5023 Acute on chronic systolic (congestive) heart failure: Secondary | ICD-10-CM | POA: Diagnosis not present

## 2020-05-14 DIAGNOSIS — T45515A Adverse effect of anticoagulants, initial encounter: Secondary | ICD-10-CM | POA: Diagnosis not present

## 2020-05-14 DIAGNOSIS — I5041 Acute combined systolic (congestive) and diastolic (congestive) heart failure: Secondary | ICD-10-CM | POA: Diagnosis present

## 2020-05-14 DIAGNOSIS — Y92009 Unspecified place in unspecified non-institutional (private) residence as the place of occurrence of the external cause: Secondary | ICD-10-CM

## 2020-05-14 DIAGNOSIS — D631 Anemia in chronic kidney disease: Secondary | ICD-10-CM | POA: Diagnosis present

## 2020-05-14 DIAGNOSIS — M2550 Pain in unspecified joint: Secondary | ICD-10-CM | POA: Diagnosis not present

## 2020-05-14 DIAGNOSIS — G9341 Metabolic encephalopathy: Secondary | ICD-10-CM | POA: Diagnosis present

## 2020-05-14 DIAGNOSIS — D696 Thrombocytopenia, unspecified: Secondary | ICD-10-CM | POA: Diagnosis present

## 2020-05-14 DIAGNOSIS — R404 Transient alteration of awareness: Secondary | ICD-10-CM | POA: Diagnosis not present

## 2020-05-14 DIAGNOSIS — I959 Hypotension, unspecified: Secondary | ICD-10-CM

## 2020-05-14 DIAGNOSIS — S199XXA Unspecified injury of neck, initial encounter: Secondary | ICD-10-CM | POA: Diagnosis not present

## 2020-05-14 DIAGNOSIS — R000 Tachycardia, unspecified: Secondary | ICD-10-CM | POA: Diagnosis not present

## 2020-05-14 DIAGNOSIS — R29810 Facial weakness: Secondary | ICD-10-CM | POA: Diagnosis not present

## 2020-05-14 DIAGNOSIS — I447 Left bundle-branch block, unspecified: Secondary | ICD-10-CM | POA: Diagnosis present

## 2020-05-14 DIAGNOSIS — R680 Hypothermia, not associated with low environmental temperature: Secondary | ICD-10-CM | POA: Diagnosis present

## 2020-05-14 DIAGNOSIS — I6389 Other cerebral infarction: Secondary | ICD-10-CM | POA: Diagnosis not present

## 2020-05-14 DIAGNOSIS — H409 Unspecified glaucoma: Secondary | ICD-10-CM | POA: Diagnosis present

## 2020-05-14 DIAGNOSIS — S0993XA Unspecified injury of face, initial encounter: Secondary | ICD-10-CM | POA: Diagnosis not present

## 2020-05-14 DIAGNOSIS — E875 Hyperkalemia: Secondary | ICD-10-CM | POA: Diagnosis present

## 2020-05-14 DIAGNOSIS — I950 Idiopathic hypotension: Secondary | ICD-10-CM | POA: Diagnosis not present

## 2020-05-14 DIAGNOSIS — I428 Other cardiomyopathies: Secondary | ICD-10-CM | POA: Diagnosis present

## 2020-05-14 DIAGNOSIS — N170 Acute kidney failure with tubular necrosis: Secondary | ICD-10-CM | POA: Diagnosis present

## 2020-05-14 DIAGNOSIS — R531 Weakness: Secondary | ICD-10-CM | POA: Diagnosis not present

## 2020-05-14 DIAGNOSIS — I9589 Other hypotension: Secondary | ICD-10-CM | POA: Diagnosis not present

## 2020-05-14 DIAGNOSIS — M7989 Other specified soft tissue disorders: Secondary | ICD-10-CM | POA: Diagnosis not present

## 2020-05-14 DIAGNOSIS — N390 Urinary tract infection, site not specified: Secondary | ICD-10-CM | POA: Diagnosis not present

## 2020-05-14 DIAGNOSIS — I4892 Unspecified atrial flutter: Secondary | ICD-10-CM | POA: Diagnosis not present

## 2020-05-14 DIAGNOSIS — G9389 Other specified disorders of brain: Secondary | ICD-10-CM | POA: Diagnosis not present

## 2020-05-14 DIAGNOSIS — I129 Hypertensive chronic kidney disease with stage 1 through stage 4 chronic kidney disease, or unspecified chronic kidney disease: Secondary | ICD-10-CM | POA: Diagnosis not present

## 2020-05-14 LAB — I-STAT CHEM 8, ED
BUN: 65 mg/dL — ABNORMAL HIGH (ref 8–23)
Calcium, Ion: 0.68 mmol/L — CL (ref 1.15–1.40)
Chloride: 120 mmol/L — ABNORMAL HIGH (ref 98–111)
Creatinine, Ser: 1.8 mg/dL — ABNORMAL HIGH (ref 0.44–1.00)
Glucose, Bld: 85 mg/dL (ref 70–99)
HCT: 22 % — ABNORMAL LOW (ref 36.0–46.0)
Hemoglobin: 7.5 g/dL — ABNORMAL LOW (ref 12.0–15.0)
Potassium: 3.1 mmol/L — ABNORMAL LOW (ref 3.5–5.1)
Sodium: 146 mmol/L — ABNORMAL HIGH (ref 135–145)
TCO2: 11 mmol/L — ABNORMAL LOW (ref 22–32)

## 2020-05-14 LAB — CBG MONITORING, ED: Glucose-Capillary: 134 mg/dL — ABNORMAL HIGH (ref 70–99)

## 2020-05-14 MED ORDER — LACTATED RINGERS IV BOLUS
1000.0000 mL | Freq: Once | INTRAVENOUS | Status: AC
  Administered 2020-05-14: 1000 mL via INTRAVENOUS

## 2020-05-14 NOTE — ED Notes (Signed)
Pt transported to CT ?

## 2020-05-14 NOTE — ED Triage Notes (Signed)
Pt BIB [facility] from home, [facility] 7pm tonight, son her a "thud" coming from pt's room around 2230 and called EMS, pt found unresponsive. Hypotensive, on arrival with EMS bagging.

## 2020-05-15 ENCOUNTER — Encounter (HOSPITAL_COMMUNITY): Payer: Self-pay | Admitting: Family Medicine

## 2020-05-15 ENCOUNTER — Other Ambulatory Visit: Payer: Self-pay

## 2020-05-15 ENCOUNTER — Inpatient Hospital Stay (HOSPITAL_COMMUNITY): Payer: Medicare HMO

## 2020-05-15 DIAGNOSIS — E861 Hypovolemia: Secondary | ICD-10-CM

## 2020-05-15 DIAGNOSIS — I4892 Unspecified atrial flutter: Secondary | ICD-10-CM | POA: Diagnosis not present

## 2020-05-15 DIAGNOSIS — I959 Hypotension, unspecified: Secondary | ICD-10-CM

## 2020-05-15 DIAGNOSIS — N184 Chronic kidney disease, stage 4 (severe): Secondary | ICD-10-CM

## 2020-05-15 DIAGNOSIS — E872 Acidosis, unspecified: Secondary | ICD-10-CM

## 2020-05-15 DIAGNOSIS — I130 Hypertensive heart and chronic kidney disease with heart failure and stage 1 through stage 4 chronic kidney disease, or unspecified chronic kidney disease: Secondary | ICD-10-CM | POA: Diagnosis not present

## 2020-05-15 DIAGNOSIS — R040 Epistaxis: Secondary | ICD-10-CM | POA: Diagnosis not present

## 2020-05-15 DIAGNOSIS — I214 Non-ST elevation (NSTEMI) myocardial infarction: Secondary | ICD-10-CM | POA: Diagnosis not present

## 2020-05-15 DIAGNOSIS — Z20822 Contact with and (suspected) exposure to covid-19: Secondary | ICD-10-CM | POA: Diagnosis not present

## 2020-05-15 DIAGNOSIS — R6521 Severe sepsis with septic shock: Secondary | ICD-10-CM | POA: Diagnosis not present

## 2020-05-15 DIAGNOSIS — N170 Acute kidney failure with tubular necrosis: Secondary | ICD-10-CM | POA: Diagnosis not present

## 2020-05-15 DIAGNOSIS — D631 Anemia in chronic kidney disease: Secondary | ICD-10-CM | POA: Diagnosis present

## 2020-05-15 DIAGNOSIS — R9431 Abnormal electrocardiogram [ECG] [EKG]: Secondary | ICD-10-CM

## 2020-05-15 DIAGNOSIS — N179 Acute kidney failure, unspecified: Secondary | ICD-10-CM

## 2020-05-15 DIAGNOSIS — A419 Sepsis, unspecified organism: Secondary | ICD-10-CM

## 2020-05-15 DIAGNOSIS — I428 Other cardiomyopathies: Secondary | ICD-10-CM | POA: Diagnosis present

## 2020-05-15 DIAGNOSIS — I5082 Biventricular heart failure: Secondary | ICD-10-CM | POA: Diagnosis present

## 2020-05-15 DIAGNOSIS — G9341 Metabolic encephalopathy: Secondary | ICD-10-CM

## 2020-05-15 DIAGNOSIS — R570 Cardiogenic shock: Secondary | ICD-10-CM | POA: Diagnosis not present

## 2020-05-15 DIAGNOSIS — I951 Orthostatic hypotension: Secondary | ICD-10-CM | POA: Diagnosis not present

## 2020-05-15 DIAGNOSIS — Y92009 Unspecified place in unspecified non-institutional (private) residence as the place of occurrence of the external cause: Secondary | ICD-10-CM

## 2020-05-15 DIAGNOSIS — I361 Nonrheumatic tricuspid (valve) insufficiency: Secondary | ICD-10-CM | POA: Diagnosis not present

## 2020-05-15 DIAGNOSIS — I9589 Other hypotension: Secondary | ICD-10-CM

## 2020-05-15 DIAGNOSIS — R7989 Other specified abnormal findings of blood chemistry: Secondary | ICD-10-CM

## 2020-05-15 DIAGNOSIS — I950 Idiopathic hypotension: Secondary | ICD-10-CM | POA: Diagnosis not present

## 2020-05-15 DIAGNOSIS — T68XXXA Hypothermia, initial encounter: Secondary | ICD-10-CM

## 2020-05-15 DIAGNOSIS — J189 Pneumonia, unspecified organism: Secondary | ICD-10-CM

## 2020-05-15 DIAGNOSIS — I340 Nonrheumatic mitral (valve) insufficiency: Secondary | ICD-10-CM | POA: Diagnosis not present

## 2020-05-15 DIAGNOSIS — I447 Left bundle-branch block, unspecified: Secondary | ICD-10-CM | POA: Diagnosis present

## 2020-05-15 DIAGNOSIS — N271 Small kidney, bilateral: Secondary | ICD-10-CM | POA: Diagnosis present

## 2020-05-15 DIAGNOSIS — W19XXXA Unspecified fall, initial encounter: Secondary | ICD-10-CM

## 2020-05-15 DIAGNOSIS — F0390 Unspecified dementia without behavioral disturbance: Secondary | ICD-10-CM | POA: Diagnosis present

## 2020-05-15 DIAGNOSIS — I5041 Acute combined systolic (congestive) and diastolic (congestive) heart failure: Secondary | ICD-10-CM | POA: Diagnosis not present

## 2020-05-15 DIAGNOSIS — E875 Hyperkalemia: Secondary | ICD-10-CM | POA: Diagnosis present

## 2020-05-15 DIAGNOSIS — D696 Thrombocytopenia, unspecified: Secondary | ICD-10-CM | POA: Diagnosis present

## 2020-05-15 DIAGNOSIS — N189 Chronic kidney disease, unspecified: Secondary | ICD-10-CM

## 2020-05-15 DIAGNOSIS — R34 Anuria and oliguria: Secondary | ICD-10-CM | POA: Diagnosis present

## 2020-05-15 DIAGNOSIS — R680 Hypothermia, not associated with low environmental temperature: Secondary | ICD-10-CM | POA: Diagnosis present

## 2020-05-15 DIAGNOSIS — I248 Other forms of acute ischemic heart disease: Secondary | ICD-10-CM

## 2020-05-15 DIAGNOSIS — E1142 Type 2 diabetes mellitus with diabetic polyneuropathy: Secondary | ICD-10-CM | POA: Diagnosis present

## 2020-05-15 DIAGNOSIS — J9601 Acute respiratory failure with hypoxia: Secondary | ICD-10-CM | POA: Diagnosis not present

## 2020-05-15 LAB — CBC WITH DIFFERENTIAL/PLATELET
Abs Immature Granulocytes: 0.04 10*3/uL (ref 0.00–0.07)
Basophils Absolute: 0 10*3/uL (ref 0.0–0.1)
Basophils Relative: 1 %
Eosinophils Absolute: 0 10*3/uL (ref 0.0–0.5)
Eosinophils Relative: 1 %
HCT: 40.2 % (ref 36.0–46.0)
Hemoglobin: 12.6 g/dL (ref 12.0–15.0)
Immature Granulocytes: 1 %
Lymphocytes Relative: 45 %
Lymphs Abs: 2.8 10*3/uL (ref 0.7–4.0)
MCH: 32 pg (ref 26.0–34.0)
MCHC: 31.3 g/dL (ref 30.0–36.0)
MCV: 102 fL — ABNORMAL HIGH (ref 80.0–100.0)
Monocytes Absolute: 0.5 10*3/uL (ref 0.1–1.0)
Monocytes Relative: 8 %
Neutro Abs: 2.7 10*3/uL (ref 1.7–7.7)
Neutrophils Relative %: 44 %
Platelets: 80 10*3/uL — ABNORMAL LOW (ref 150–400)
RBC: 3.94 MIL/uL (ref 3.87–5.11)
RDW: 19.1 % — ABNORMAL HIGH (ref 11.5–15.5)
WBC: 6.1 10*3/uL (ref 4.0–10.5)
nRBC: 4.6 % — ABNORMAL HIGH (ref 0.0–0.2)

## 2020-05-15 LAB — URINALYSIS, ROUTINE W REFLEX MICROSCOPIC
Bilirubin Urine: NEGATIVE
Glucose, UA: NEGATIVE mg/dL
Ketones, ur: NEGATIVE mg/dL
Nitrite: NEGATIVE
Protein, ur: 100 mg/dL — AB
Specific Gravity, Urine: 1.012 (ref 1.005–1.030)
WBC, UA: >50 WBC/hpf — ABNORMAL HIGH (ref 0–5)
pH: 5 (ref 5.0–8.0)

## 2020-05-15 LAB — COMPREHENSIVE METABOLIC PANEL
ALT: 46 U/L — ABNORMAL HIGH (ref 0–44)
ALT: 48 U/L — ABNORMAL HIGH (ref 0–44)
AST: 99 U/L — ABNORMAL HIGH (ref 15–41)
AST: 98 U/L — ABNORMAL HIGH (ref 15–41)
Albumin: 3.4 g/dL — ABNORMAL LOW (ref 3.5–5.0)
Albumin: 3 g/dL — ABNORMAL LOW (ref 3.5–5.0)
Alkaline Phosphatase: 102 U/L (ref 38–126)
Alkaline Phosphatase: 90 U/L (ref 38–126)
Anion gap: 19 — ABNORMAL HIGH (ref 5–15)
Anion gap: 14 (ref 5–15)
BUN: 81 mg/dL — ABNORMAL HIGH (ref 8–23)
BUN: 75 mg/dL — ABNORMAL HIGH (ref 8–23)
CO2: 14 mmol/L — ABNORMAL LOW (ref 22–32)
CO2: 20 mmol/L — ABNORMAL LOW (ref 22–32)
Calcium: 9.8 mg/dL (ref 8.9–10.3)
Calcium: 9.6 mg/dL (ref 8.9–10.3)
Chloride: 103 mmol/L (ref 98–111)
Chloride: 103 mmol/L (ref 98–111)
Creatinine, Ser: 4.41 mg/dL — ABNORMAL HIGH (ref 0.44–1.00)
Creatinine, Ser: 4.19 mg/dL — ABNORMAL HIGH (ref 0.44–1.00)
GFR, Estimated: 10 mL/min — ABNORMAL LOW (ref >60)
GFR, Estimated: 10 mL/min — ABNORMAL LOW (ref >60)
Glucose, Bld: 169 mg/dL — ABNORMAL HIGH (ref 70–99)
Glucose, Bld: 149 mg/dL — ABNORMAL HIGH (ref 70–99)
Potassium: 5.1 mmol/L (ref 3.5–5.1)
Potassium: 5.2 mmol/L — ABNORMAL HIGH (ref 3.5–5.1)
Sodium: 136 mmol/L (ref 135–145)
Sodium: 137 mmol/L (ref 135–145)
Total Bilirubin: 1.8 mg/dL — ABNORMAL HIGH (ref 0.3–1.2)
Total Bilirubin: 1.6 mg/dL — ABNORMAL HIGH (ref 0.3–1.2)
Total Protein: 6.3 g/dL — ABNORMAL LOW (ref 6.5–8.1)
Total Protein: 6 g/dL — ABNORMAL LOW (ref 6.5–8.1)

## 2020-05-15 LAB — CBC
HCT: 35.6 % — ABNORMAL LOW (ref 36.0–46.0)
Hemoglobin: 11.3 g/dL — ABNORMAL LOW (ref 12.0–15.0)
MCH: 31.7 pg (ref 26.0–34.0)
MCHC: 31.7 g/dL (ref 30.0–36.0)
MCV: 99.7 fL (ref 80.0–100.0)
Platelets: 66 10*3/uL — ABNORMAL LOW (ref 150–400)
RBC: 3.57 MIL/uL — ABNORMAL LOW (ref 3.87–5.11)
RDW: 18.9 % — ABNORMAL HIGH (ref 11.5–15.5)
WBC: 5.2 10*3/uL (ref 4.0–10.5)
nRBC: 4.2 % — ABNORMAL HIGH (ref 0.0–0.2)

## 2020-05-15 LAB — RESP PANEL BY RT-PCR (FLU A&B, COVID) ARPGX2
Influenza A by PCR: NEGATIVE
Influenza B by PCR: NEGATIVE
SARS Coronavirus 2 by RT PCR: NEGATIVE

## 2020-05-15 LAB — PROTIME-INR
INR: 1.3 — ABNORMAL HIGH (ref 0.8–1.2)
INR: 1.3 — ABNORMAL HIGH (ref 0.8–1.2)
Prothrombin Time: 15.3 seconds — ABNORMAL HIGH (ref 11.4–15.2)
Prothrombin Time: 15.4 seconds — ABNORMAL HIGH (ref 11.4–15.2)

## 2020-05-15 LAB — PATHOLOGIST SMEAR REVIEW

## 2020-05-15 LAB — CK: Total CK: 241 U/L — ABNORMAL HIGH (ref 38–234)

## 2020-05-15 LAB — LACTIC ACID, PLASMA
Lactic Acid, Venous: 7.2 mmol/L (ref 0.5–1.9)
Lactic Acid, Venous: 5.9 mmol/L (ref 0.5–1.9)
Lactic Acid, Venous: 2.9 mmol/L (ref 0.5–1.9)

## 2020-05-15 LAB — TSH: TSH: 4.586 u[IU]/mL — ABNORMAL HIGH (ref 0.350–4.500)

## 2020-05-15 LAB — TROPONIN I (HIGH SENSITIVITY)
Troponin I (High Sensitivity): 7635 ng/L (ref <18)
Troponin I (High Sensitivity): 7341 ng/L (ref <18)
Troponin I (High Sensitivity): 8905 ng/L (ref <18)

## 2020-05-15 LAB — PROCALCITONIN: Procalcitonin: 1.36 ng/mL

## 2020-05-15 MED ORDER — ACETAMINOPHEN 325 MG PO TABS: 650.0000 mg | ORAL_TABLET | Freq: Four times a day (QID) | ORAL | Status: DC | PRN

## 2020-05-15 MED ORDER — LACTATED RINGERS IV SOLN: INTRAVENOUS | Status: DC

## 2020-05-15 MED ORDER — SODIUM CHLORIDE 0.9 % IV SOLN
500.0000 mg | Freq: Once | INTRAVENOUS | Status: AC
  Administered 2020-05-15: 500 mg via INTRAVENOUS
  Filled 2020-05-15: qty 500

## 2020-05-15 MED ORDER — SODIUM CHLORIDE 0.9 % IV SOLN: INTRAVENOUS | Status: AC

## 2020-05-15 MED ORDER — SODIUM CHLORIDE 0.9 % IV SOLN
1.0000 g | Freq: Once | INTRAVENOUS | Status: AC
  Administered 2020-05-15: 1 g via INTRAVENOUS
  Filled 2020-05-15: qty 10

## 2020-05-15 MED ORDER — LATANOPROST 0.005 % OP SOLN
1.0000 [drp] | Freq: Every day | OPHTHALMIC | Status: DC
  Administered 2020-05-16 – 2020-05-21 (×6): 1 [drp] via OPHTHALMIC
  Filled 2020-05-15: qty 2.5

## 2020-05-15 MED ORDER — SENNOSIDES-DOCUSATE SODIUM 8.6-50 MG PO TABS: 1.0000 | ORAL_TABLET | Freq: Every evening | ORAL | Status: DC | PRN

## 2020-05-15 MED ORDER — PROMETHAZINE HCL 25 MG/ML IJ SOLN
12.5000 mg | Freq: Four times a day (QID) | INTRAMUSCULAR | Status: DC | PRN
  Administered 2020-05-16: 12.5 mg via INTRAVENOUS
  Filled 2020-05-15: qty 1

## 2020-05-15 MED ORDER — ATORVASTATIN CALCIUM 40 MG PO TABS
40.0000 mg | ORAL_TABLET | Freq: Every day | ORAL | Status: DC
  Administered 2020-05-15 – 2020-05-22 (×7): 40 mg via ORAL
  Filled 2020-05-15 (×7): qty 1

## 2020-05-15 MED ORDER — SODIUM CHLORIDE 0.9 % IV SOLN
100.0000 mg | Freq: Two times a day (BID) | INTRAVENOUS | Status: AC
  Administered 2020-05-15 – 2020-05-19 (×10): 100 mg via INTRAVENOUS
  Filled 2020-05-15 (×10): qty 100

## 2020-05-15 MED ORDER — HEPARIN SODIUM (PORCINE) 5000 UNIT/ML IJ SOLN
5000.0000 [IU] | Freq: Three times a day (TID) | INTRAMUSCULAR | Status: DC
  Administered 2020-05-15 – 2020-05-19 (×15): 5000 [IU] via SUBCUTANEOUS
  Filled 2020-05-15 (×15): qty 1

## 2020-05-15 MED ORDER — CALCIUM GLUCONATE-NACL 1-0.675 GM/50ML-% IV SOLN
1.0000 g | Freq: Once | INTRAVENOUS | Status: AC
  Administered 2020-05-15: 1000 mg via INTRAVENOUS
  Filled 2020-05-15: qty 50

## 2020-05-15 MED ORDER — LACTATED RINGERS IV BOLUS
1000.0000 mL | Freq: Once | INTRAVENOUS | Status: AC
  Administered 2020-05-15: 1000 mL via INTRAVENOUS

## 2020-05-15 MED ORDER — ACETAMINOPHEN 650 MG RE SUPP: 650.0000 mg | Freq: Four times a day (QID) | RECTAL | Status: DC | PRN

## 2020-05-15 MED ORDER — SODIUM CHLORIDE 0.9 % IV SOLN
1.0000 g | INTRAVENOUS | Status: AC
  Administered 2020-05-15 – 2020-05-19 (×5): 1 g via INTRAVENOUS
  Filled 2020-05-15 (×5): qty 10

## 2020-05-15 NOTE — H&P (Signed)
History and Physical    Erin Liu UJW:[id number] DOB: Nov 26, 1940 DOA: 05/14/2020  PCP: Binnie Rail, MD   Patient coming from: Home  Chief Complaint: fell at home, unresponsive  HPI: Erin Liu is a 80 y.o. female with medical history significant for  history of anemia, diabetes, dementia, hyperlipidemia, hypertension, renal failure not on dialysis who presents by EMS after a fall at home and being unresponsive.  History is gotten from ER provider as no family at bedside and pt is not a good historian.  [name] states that she fell when trying to get out of bed to go to the bathroom. Apparently patient was last seen well around 7:00.  She had supper she was noted she been acting herself.  Then around 1030 family heard a thud room.  They went up to her room and she was unresponsive on the floor.  EMS was called.  On arrival patient was reportedly unresponsive.  Unknown blood sugar or vital signs they started bagging her and brought her here for further evaluation.   ED Course: Patient was found to be hypothermic and hypotensive.  She was given IV fluid hydration and placed on a Retail banker.  CT of the head and neck were obtained and showed no acute pathology.  Patient is elevated lactic acid level.  Patient was found to have elevated creatinine of 4.4 from her baseline of 2.6.  Review of Systems:  Can not obtain secondary to dementia and encephalopathy  Past Medical History:  Diagnosis Date  . ANEMIA, NORMOCYTIC   . Arthritis    back  . BREAST CYST, HX OF   . CARPAL TUNNEL SYNDROME, BILATERAL   . DIABETES MELLITUS, TYPE II   . Glaucoma   . HYPERLIPIDEMIA   . HYPERTENSION   . OBESITY   . PERIPHERAL NEUROPATHY   . RENAL FAILURE, CHRONIC    Stage 4    Past Surgical History:  Procedure Laterality Date  . AV FISTULA PLACEMENT Left 05/23/2018   Procedure: ARTERIOVENOUS (AV) FISTULA CREATION LEFT ARM;  Surgeon: Waynetta Sandy, MD;  Location: [facility];  Service:  Vascular;  Laterality: Left;  . BASCILIC VEIN TRANSPOSITION Left 08/03/2018   Procedure: SECOND STAGE BASILIC VEIN TRANSPOSITION LEFT ARM;  Surgeon: Waynetta Sandy, MD;  Location: [facility];  Service: Vascular;  Laterality: Left;  . CATARACT EXTRACTION BILATERAL W/ ANTERIOR VITRECTOMY  05/2010  . COLONOSCOPY     [facility]/7-8 years    Social History  reports that she has never smoked. She has never used smokeless tobacco. She reports that she does not drink alcohol and does not use drugs.  Allergies  Allergen Reactions  . Lisinopril Cough    Family History  Problem Relation Age of Onset  . Arthritis Son   . Diabetes Son   . Hypertension Son   . Colon cancer Son 70  . Neuropathy Son        chemotherapy related  . Heart attack Mother        Died age 56s  . Heart attack Father        No details 12s  . Heart disease Sister        Slight heart attack, died age 35     Prior to Admission medications   Medication Sig Start Date End Date Taking? Authorizing Provider  allopurinol (ZYLOPRIM) 100 MG tablet TAKE 2 TABLETS BY MOUTH DAILY 05/13/20   Binnie Rail, MD  atorvastatin (LIPITOR) 40 MG tablet TAKE  1 TABLET BY MOUTH DAILY 11/05/19   Binnie Rail, MD  Cyanocobalamin (VITAMIN B-12) 2000 MCG TBCR Take 1 tablet (2,000 mcg total) by mouth daily. 07/24/18   Binnie Rail, MD  donepezil (ARICEPT) 5 MG tablet Take 1 tablet (5 mg total) by mouth at bedtime. 04/28/20   Binnie Rail, MD  furosemide (LASIX) 40 MG tablet Take 40 mg by mouth every other day. 02/10/20   [provider]  latanoprost (XALATAN) 0.005 % ophthalmic solution Place 1 drop into both eyes at bedtime. 12/09/17   [provider]    Physical Exam: Vitals:   05/15/20 0059 05/15/20 0243 05/15/20 0315 05/15/20 0330  BP: 102/76 (!) 68/56 90/65 (!) 89/66  Pulse: (!) 110   (!) 103  Resp: 20   20  Temp:      TempSrc:      SpO2: 98%   97%    Constitutional: NAD, calm, comfortable Vitals:   05/15/20  0059 05/15/20 0243 05/15/20 0315 05/15/20 0330  BP: 102/76 (!) 68/56 90/65 (!) 89/66  Pulse: (!) 110   (!) 103  Resp: 20   20  Temp:      TempSrc:      SpO2: 98%   97%   General: WDWN, Alert and oriented to self.  Eyes: EOMI, PERRL, conjunctivae normal.  Sclera nonicteric HENT:  [place]/AT, external ears normal.  Nares patent without epistasis.  Mucous membranes are moist. Neck: Soft, normal range of motion, supple, no masses, no thyromegaly. Trachea midline Respiratory: distant but clear to auscultation bilaterally, no wheezing, no crackles. Normal respiratory effort. No accessory muscle use.  Cardiovascular: Sinus tachycardia. No murmurs / rubs / gallops. Mild lower extremity edema. Abdomen: Soft, no tenderness, nondistended, no rebound or guarding.  No masses palpated. Bowel sounds normoactive Musculoskeletal: FROM. no cyanosis. No joint deformity upper and lower extremities.  Normal passive flexion and extension of knees bilaterally.  Skin: Warm, dry, intact no rashes, lesions, ulcers. No induration Neurologic: CN 2-12 grossly intact. Withdraws from painful stimuli. Quiet voice but can understand speech Psychiatric: Normal judgment and insight.  Normal mood.    Labs on Admission: I have personally reviewed following labs and imaging studies  CBC: Recent Labs  Lab 05/14/20 2317 05/14/20 2338  WBC 6.1  --   NEUTROABS 2.7  --   HGB 12.6 7.5*  HCT 40.2 22.0*  MCV 102.0*  --   PLT 80*  --     Basic Metabolic Panel: Recent Labs  Lab 05/14/20 2317 05/14/20 2338  NA 136 146*  K 5.1 3.1*  CL 103 120*  CO2 14*  --   GLUCOSE 169* 85  BUN 81* 65*  CREATININE 4.41* 1.80*  CALCIUM 9.8  --     GFR: CrCl cannot be calculated (Unknown ideal weight.).  Liver Function Tests: Recent Labs  Lab 05/14/20 2317  AST 99*  ALT 46*  ALKPHOS 102  BILITOT 1.8*  PROT 6.3*  ALBUMIN 3.4*    Urine analysis:    Component Value Date/Time   COLORURINE YELLOW 12/14/2017 1815    APPEARANCEUR CLEAR 12/14/2017 1815   LABSPEC 1.010 12/14/2017 1815   PHURINE 5.5 12/14/2017 1815   GLUCOSEU NEGATIVE 12/14/2017 1815   GLUCOSEU NEGATIVE 08/26/2015 1633   HGBUR NEGATIVE 12/14/2017 1815   HGBUR moderate 06/16/2009 0928   BILIRUBINUR NEGATIVE 12/14/2017 1815   KETONESUR NEGATIVE 12/14/2017 1815   PROTEINUR NEGATIVE 12/14/2017 1815   UROBILINOGEN 0.2 08/26/2015 1633   NITRITE NEGATIVE 12/14/2017 1815  LEUKOCYTESUR LARGE (A) 12/14/2017 1815    Radiological Exams on Admission: CT Head Wo Contrast  Result Date: 05/15/2020 CLINICAL DATA:  Facial trauma, neck trauma EXAM: CT HEAD WITHOUT CONTRAST CT CERVICAL SPINE WITHOUT CONTRAST TECHNIQUE: Multidetector CT imaging of the head and cervical spine was performed following the standard protocol without intravenous contrast. Multiplanar CT image reconstructions of the cervical spine were also generated. COMPARISON:  CT head 10/02/2017 FINDINGS: CT HEAD FINDINGS Brain: Normal anatomic configuration. Parenchymal volume loss is commensurate with the patient's age. Moderate periventricular white matter changes are present likely reflecting the sequela of small vessel ischemia. Remote lacunar infarcts are noted within the right thalamus, right basal ganglia, left caudate nucleus, and right centrum semiovale. Small infarct within the centrum semiovale appears new since prior examination, though is remote in appearance. No abnormal intra or extra-axial mass lesion or fluid collection. No abnormal mass effect or midline shift. No evidence of acute intracranial hemorrhage or infarct. Ventricular size is normal. Cerebellum unremarkable. Vascular: No asymmetric hyperdense vasculature at the skull base. Skull: Intact Sinuses/Orbits: Paranasal sinuses are clear. Orbits are unremarkable. Other: Mastoid air cells and middle ear cavities are clear. Mild right supraorbital soft tissue swelling. CT CERVICAL SPINE FINDINGS Alignment: Straightening of the  cervical spine may be positional in nature. No listhesis. Skull base and vertebrae: No acute fracture. No primary bone lesion or focal pathologic process. Soft tissues and spinal canal: No prevertebral fluid or swelling. No visible canal hematoma. Disc levels: There is intervertebral disc space narrowing and endplate remodeling at Z1-2 in keeping with changes of mild degenerative disc disease. Mild endplate changes are also noted at C4-5 and C5-6 in keeping with minimal degenerative disc disease at these levels. The spinal canal is widely patent. The prevertebral soft tissues are not thickened on sagittal reformats. Review of the axial images demonstrates mild bilateral neuroforaminal narrowing, left greater than right, at C3-4 secondary to uncovertebral arthrosis. Upper chest: Small left pleural effusion. Asymmetric soft tissue swelling is seen involving the visualized left anterior chest wall. Other: None IMPRESSION: No acute intracranial abnormality. No calvarial fracture. Mild right supraorbital soft tissue swelling. No acute fracture or listhesis of the cervical spine. Asymmetric left anterior chest wall soft tissue swelling, incompletely evaluated on this examination. Small left pleural effusion also identified. Electronically Signed   By: Fidela Salisbury MD   On: 05/15/2020 00:08   CT Cervical Spine Wo Contrast  Result Date: 05/15/2020 CLINICAL DATA:  Facial trauma, neck trauma EXAM: CT HEAD WITHOUT CONTRAST CT CERVICAL SPINE WITHOUT CONTRAST TECHNIQUE: Multidetector CT imaging of the head and cervical spine was performed following the standard protocol without intravenous contrast. Multiplanar CT image reconstructions of the cervical spine were also generated. COMPARISON:  CT head 10/02/2017 FINDINGS: CT HEAD FINDINGS Brain: Normal anatomic configuration. Parenchymal volume loss is commensurate with the patient's age. Moderate periventricular white matter changes are present likely reflecting the sequela  of small vessel ischemia. Remote lacunar infarcts are noted within the right thalamus, right basal ganglia, left caudate nucleus, and right centrum semiovale. Small infarct within the centrum semiovale appears new since prior examination, though is remote in appearance. No abnormal intra or extra-axial mass lesion or fluid collection. No abnormal mass effect or midline shift. No evidence of acute intracranial hemorrhage or infarct. Ventricular size is normal. Cerebellum unremarkable. Vascular: No asymmetric hyperdense vasculature at the skull base. Skull: Intact Sinuses/Orbits: Paranasal sinuses are clear. Orbits are unremarkable. Other: Mastoid air cells and middle ear cavities are clear. Mild right  supraorbital soft tissue swelling. CT CERVICAL SPINE FINDINGS Alignment: Straightening of the cervical spine may be positional in nature. No listhesis. Skull base and vertebrae: No acute fracture. No primary bone lesion or focal pathologic process. Soft tissues and spinal canal: No prevertebral fluid or swelling. No visible canal hematoma. Disc levels: There is intervertebral disc space narrowing and endplate remodeling at Y4-0 in keeping with changes of mild degenerative disc disease. Mild endplate changes are also noted at C4-5 and C5-6 in keeping with minimal degenerative disc disease at these levels. The spinal canal is widely patent. The prevertebral soft tissues are not thickened on sagittal reformats. Review of the axial images demonstrates mild bilateral neuroforaminal narrowing, left greater than right, at C3-4 secondary to uncovertebral arthrosis. Upper chest: Small left pleural effusion. Asymmetric soft tissue swelling is seen involving the visualized left anterior chest wall. Other: None IMPRESSION: No acute intracranial abnormality. No calvarial fracture. Mild right supraorbital soft tissue swelling. No acute fracture or listhesis of the cervical spine. Asymmetric left anterior chest wall soft tissue  swelling, incompletely evaluated on this examination. Small left pleural effusion also identified. Electronically Signed   By: Fidela Salisbury MD   On: 05/15/2020 00:08   DG Chest Portable 1 View  Result Date: 05/14/2020 CLINICAL DATA:  Fall, found unresponsive EXAM: PORTABLE CHEST 1 VIEW COMPARISON:  Radiograph 07/18/2019 FINDINGS: Enlarged, lobular appearance of the cardiac silhouette, can reflect cardiomegaly or pericardial effusion. The aorta is calcified. The remaining cardiomediastinal contours are unremarkable. Some hazy basilar predominant opacities may reflect atelectasis and or early interstitial edema given some vascular congestion with fissural and septal thickening. No pneumothorax. No visible layering effusion. No acute traumatic abnormality of the chest wall. Degenerative changes present in the shoulders and spine. Telemetry leads overlie the chest. IMPRESSION: 1. Enlarged, lobular appearance of the cardiac silhouette, can reflect cardiomegaly or pericardial effusion. 2. Some hazy basilar predominant opacities may reflect atelectasis and/or early interstitial edema given some vascular congestion with fissural and septal thickening. Electronically Signed   By: Lovena Le M.D.   On: 05/14/2020 23:34    EKG: Independently reviewed.  EKG shows sinus tachycardia with left bundle branch block.  No acute ST elevation or depression.  QTc prolonged at 581  Assessment/Plan Principal Problem:   Renal failure (ARF), acute on chronic  Ms. Maret has ARF superimposed on CKD 4.  Admit to progressive care unit.  Baseline creatine was 2.6 and is 4.4 tonight. There is an istat lab from ER that reads creatinine of 1.8 but the lab run CMP shows creatinine of 4.4 which is more consistent with pt presentation. She is not making urine. Was given IVF in ER and only has 82 ml in bladder on bladder scan.  Obtain renal u/s.  Nephrology consulted and will see pt in am.  Pt does have a fistula in left arm for HD  but has never started dialysis.   Active Problems:   Sepsis  Meets sepsis criteria with altered mental status, tachycardia, hypotension, hypothermia, elevated lactic acid over 5 and ARF for end organ dysfunction. Pt has probable CAP on CXR. Started on empiric antibiotics in Er.  Given Rocephin and azithromycin in the emergency room.  We will continue Rocephin but will stop azithromycin with prolonged QT interval.  Will use doxycycline in its place.    Hypotension Patient presented with very low blood pressure which is improved with IV fluid boluses.  We will continue to monitor blood pressure closely.  Hold antihypertensives  Hypothermia Patient hypothermic when she presented.  Is on bear hugger and temperature is improving    CAP (community acquired pneumonia) Patient is placed on Rocephin and doxycycline for antibiotic coverage    Acute metabolic encephalopathy Secondary to acute renal failure and sepsis.  Patient does have baseline dementia but patient was more unresponsive than her baseline level according to family that was in the emergency room earlier.  No family is at bedside now and report is provided by ER physician.  There was question of slurred speech and drooping face on the left side when she presented which is now improved. CT of the head did not show any hemorrhage or acute stroke.  Will obtain MRI of the brain to make sure no acute ischemic event occurred    Fall at home, initial encounter Reports falling onto her knees at home when she tried to get out of bed.    Prolonged QT interval Avoid medications which could further prolong QT interval Monitor on telemetry    Elevated lactic acid level Patient given IV fluids in the emergency room and will repeat lactic acid in few hours.    DVT prophylaxis: Heparin SQ for DVT prophylaxis. Padua score elevated Code Status:   Full code  Family Communication:  No family at bedside. No answer when called.  Disposition Plan:    Patient is from:  Home  Anticipated DC to:  Home  Anticipated DC date:  Anticipate more than 2 midnight stay in the hospital  Anticipated DC barriers: No barriers to discharge identified at this time  Consults called:  Nephrology, ER physician called and will see in AM.  Admission status:  inpatient   Yevonne Aline Myrtle Barnhard MD [facility]  How to contact the [facility] Attending or Consulting provider [place] or covering provider during after hours [place], for this patient?   1. Check the care team in [facility] and look for a) attending/consulting [facility] provider listed and b) the [facility] team listed 2. Log into [email or web address] and use [facility]'s universal password to access. If you do not have the password, please contact the hospital operator. 3. Locate the [facility] provider you are looking for under [facility] and page to a number that you can be directly reached. 4. If you still have difficulty reaching the provider, please page the [facility] (Director on Call) for the Hospitalists listed on amion for assistance.  05/15/2020, 4:32 AM

## 2020-05-15 NOTE — ED Notes (Addendum)
..  CRITICAL VALUE STICKER  CRITICAL VALUE: Troponin 7635  RECEIVER (on-site recipient of call):  DATE & TIME NOTIFIED: 3/10 0820  MESSENGER (representative from lab): Judson Roch RN   MD NOTIFIED: [place]  TIME OF NOTIFICATION: 0820  RESPONSE:

## 2020-05-15 NOTE — Consult Note (Signed)
[facility] Renal Consultation Note  Requesting MD: Flora Lipps, MD Indication for Consultation:  AKI on CKD  Chief complaint: Found unresponsive after a fall at home  HPI:  Erin Liu is a 80 y.o. female with a history including diabetes, hypertension, dementia, and CKD who presented to the hospital after a fall at home.  Her family found her unresponsive on the floor after hearing her fall and called EMS after finding her down.  Her baseline creatinine appears between 2.5-3 as listed below.  Creatinine was noted to be 4.41 on 3/9 PM and and on repeat this morning was 4.19.  No acute intracranial process per CT head or MR here in the ER.  She has received IV fluids-LR.  She follows with Dr. Posey Pronto at [facility] and was last seen 05/06/2020.  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  Additional access was deferred given her dementia as she had experienced decline in her memory, was initiated on aricept, and was minimally engaging at the last appointment.  Her labs on 05/06/20 were creatinine 2.96 BUN 78 EGFR 15 potassium 4.6.  Her daughter Erin Liu is at bedside and reports that her mom has been doing better today and has been joking with family on the phone and she was talking to the nurses earlier and expressing her thanks to the staff.  The patient lives with her son and they are working to get her services with [facility].  Her daughter comes to visit and to assist with bathing.  She is "just in the bed".  She is not sure if her mom would normally know the year but she does recognize family members.  Hard to give concrete examples of memory deficits.  Erin Liu confirms that she agrees with not pursuing dialysis for her mom should her renal function worsen.  The patient is not able to provide any history from me and is not able to answer questions.  Bladder scan ok at 109 per nursing  documentation.  Creat  Date/Time Value Ref Range Status  09/12/2019 04:06 PM 3.07 (H) 0.60 - 0.93 mg/dL Final    Comment:    For patients >59 years of age, the reference limit for Creatinine is approximately 13% higher for people identified as African-American. .    Creatinine, Ser  Date/Time Value Ref Range Status  05/15/2020 06:37 AM 4.19 (H) 0.44 - 1.00 mg/dL Final    Comment:    DELTA CHECK NOTED PT RECEIVING CONTINUOUS FLUIDS   05/14/2020 11:38 PM 1.80 (H) 0.44 - 1.00 mg/dL Final  05/14/2020 11:17 PM 4.41 (H) 0.44 - 1.00 mg/dL Final  03/03/2020 02:30 PM 2.63 (H) 0.40 - 1.20 mg/dL Final  07/18/2019 11:35 AM 2.23 (H) 0.40 - 1.20 mg/dL Final  01/18/2018 03:25 PM 2.69 (H) 0.40 - 1.20 mg/dL Final  01/13/2018 01:46 PM 2.34 (H) 0.40 - 1.20 mg/dL Final  01/11/2018 06:35 AM 3.16 (H) 0.44 - 1.00 mg/dL Final  01/11/2018 02:21 AM 2.84 (H) 0.44 - 1.00 mg/dL Final  01/10/2018 06:43 PM 3.81 (H) 0.44 - 1.00 mg/dL Final  01/10/2018 03:55 PM 3.47 (H) 0.40 - 1.20 mg/dL Final  12/14/2017 03:32 PM 2.90 (H) 0.44 - 1.00 mg/dL Final  10/17/2017 01:24 PM 2.94 (H) 0.40 - 1.20 mg/dL Final  10/10/2017 08:47 AM 2.04 (H) 0.40 - 1.20 mg/dL Final  10/04/2017 05:46 AM 2.50 (H) 0.44 - 1.00 mg/dL Final  10/03/2017 06:15 AM 2.02 (  H) 0.44 - 1.00 mg/dL Final  10/02/2017 07:26 PM 2.20 (H) 0.44 - 1.00 mg/dL Final  10/02/2017 02:33 PM 2.28 (H) 0.44 - 1.00 mg/dL Final  04/14/2017 02:31 PM 2.70 (H) 0.40 - 1.20 mg/dL Final  04/11/2017 10:14 AM 2.71 (H) 0.40 - 1.20 mg/dL Final  08/29/2015 03:56 PM 1.83 (H) 0.40 - 1.20 mg/dL Final  08/26/2015 04:33 PM 2.09 (H) 0.40 - 1.20 mg/dL Final  02/25/2015 11:18 AM 2.11 (H) 0.40 - 1.20 mg/dL Final  10/15/2013 09:11 AM 2.0 (H) 0.4 - 1.2 mg/dL Final  03/19/2013 09:57 AM 2.0 (H) 0.4 - 1.2 mg/dL Final  11/10/2012 09:01 AM 1.8 (H) 0.4 - 1.2 mg/dL Final  05/10/2012 08:53 AM 2.0 (H) 0.4 - 1.2 mg/dL Final  05/08/2010 09:52 AM 1.6 (H) 0.4 - 1.2 mg/dL Final  01/08/2010 10:03 AM 1.6  (H) 0.4 - 1.2 mg/dL Final  06/17/2009 02:44 AM 1.29 (H) (0.40-1.20 mg/dL Final  02/25/2009 12:10 PM 1.24 (H) 0.4 - 1.2 mg/dL Final  12/10/2008 09:13 PM 1.38 (H) (0.40-1.20 mg/dL Final  05/16/2008 08:25 PM 1.36 (H) 0.40 - 1.20 mg/dL Final  05/07/2008 10:37 PM 1.61 (H) 0.40 - 1.20 mg/dL Final  04/22/2008 08:20 PM 1.50 (H) 0.40 - 1.20 mg/dL Final  08/17/2007 10:05 AM 1.56 (H) 0.40 - 1.20 mg/dL Final  01/17/2007 06:07 PM 1.34 (H) 0.40 - 1.20 mg/dL Final  08/04/2006 06:03 PM 1.37 (H) 0.40 - 1.20 mg/dL Final  02/22/2006 10:45 AM 1.30 (H) 0.40 - 1.20 mg/dL Final     PMHx:   Past Medical History:  Diagnosis Date  . ANEMIA, NORMOCYTIC   . Arthritis    back  . BREAST CYST, HX OF   . CARPAL TUNNEL SYNDROME, BILATERAL   . DIABETES MELLITUS, TYPE II   . Glaucoma   . HYPERLIPIDEMIA   . HYPERTENSION   . OBESITY   . PERIPHERAL NEUROPATHY   . RENAL FAILURE, CHRONIC    Stage 4    Past Surgical History:  Procedure Laterality Date  . AV FISTULA PLACEMENT Left 05/23/2018   Procedure: ARTERIOVENOUS (AV) FISTULA CREATION LEFT ARM;  Surgeon: Waynetta Sandy, MD;  Location: [place];  Service: Vascular;  Laterality: Left;  . BASCILIC VEIN TRANSPOSITION Left 08/03/2018   Procedure: SECOND STAGE BASILIC VEIN TRANSPOSITION LEFT ARM;  Surgeon: Waynetta Sandy, MD;  Location: [place];  Service: Vascular;  Laterality: Left;  . CATARACT EXTRACTION BILATERAL W/ ANTERIOR VITRECTOMY  05/2010  . COLONOSCOPY     [facility]/7-8 years    Family Hx:  Family History  Problem Relation Age of Onset  . Arthritis Son   . Diabetes Son   . Hypertension Son   . Colon cancer Son 7  . Neuropathy Son        chemotherapy related  . Heart attack Mother        Died age 90s  . Heart attack Father        No details 4s  . Heart disease Sister        Slight heart attack, died age 35    Social History:  reports that she has never smoked. She has never used smokeless tobacco. She reports that she does not  drink alcohol and does not use drugs.  Allergies:  Allergies  Allergen Reactions  . Lisinopril Cough    Medications: Prior to Admission medications   Medication Sig Start Date End Date Taking? Authorizing Provider  acetaminophen (TYLENOL) 500 MG tablet Take 500 mg by mouth 2 (two) times daily.  Yes [provider]  allopurinol (ZYLOPRIM) 100 MG tablet TAKE 2 TABLETS BY MOUTH DAILY Patient taking differently: Take 200 mg by mouth daily. 05/13/20  Yes Burns, Claudina Lick, MD  atorvastatin (LIPITOR) 40 MG tablet TAKE 1 TABLET BY MOUTH DAILY Patient taking differently: Take 40 mg by mouth daily. 11/05/19  Yes Burns, Claudina Lick, MD  Cyanocobalamin (VITAMIN B-12) 2000 MCG TBCR Take 1 tablet (2,000 mcg total) by mouth daily. 07/24/18  Yes Burns, Claudina Lick, MD  donepezil (ARICEPT) 5 MG tablet Take 1 tablet (5 mg total) by mouth at bedtime. 04/28/20  Yes Burns, Claudina Lick, MD  furosemide (LASIX) 40 MG tablet Take 40 mg by mouth every other day. 02/10/20  Yes [provider]  latanoprost (XALATAN) 0.005 % ophthalmic solution Place 1 drop into both eyes at bedtime. 12/09/17  Yes [provider]    I have reviewed the patient's current and reported prior to admission medications.  Labs:  BMP Latest Ref Rng & Units 05/15/2020 05/14/2020 05/14/2020  Glucose 70 - 99 mg/dL 149(H) 85 169(H)  BUN 8 - 23 mg/dL 75(H) 65(H) 81(H)  Creatinine 0.44 - 1.00 mg/dL 4.19(H) 1.80(H) 4.41(H)  BUN/Creat Ratio 6 - 22 (calc) - - -  Sodium 135 - 145 mmol/L 137 146(H) 136  Potassium 3.5 - 5.1 mmol/L 5.2(H) 3.1(L) 5.1  Chloride 98 - 111 mmol/L 103 120(H) 103  CO2 22 - 32 mmol/L 20(L) - 14(L)  Calcium 8.9 - 10.3 mg/dL 9.6 - 9.8    Urinalysis    Component Value Date/Time   COLORURINE YELLOW 05/15/2020 1219   APPEARANCEUR CLOUDY (A) 05/15/2020 1219   LABSPEC 1.012 05/15/2020 1219   PHURINE 5.0 05/15/2020 1219   GLUCOSEU NEGATIVE 05/15/2020 1219   GLUCOSEU NEGATIVE 08/26/2015 1633   HGBUR MODERATE (A)  05/15/2020 1219   HGBUR moderate 06/16/2009 0928   BILIRUBINUR NEGATIVE 05/15/2020 1219   KETONESUR NEGATIVE 05/15/2020 1219   PROTEINUR 100 (A) 05/15/2020 1219   UROBILINOGEN 0.2 08/26/2015 1633   NITRITE NEGATIVE 05/15/2020 1219   LEUKOCYTESUR LARGE (A) 05/15/2020 1219     ROS:  Unable to obtain secondary to altered mental status   Physical Exam: Vitals:   05/15/20 1345 05/15/20 1400  BP: 100/72 105/87  Pulse:    Resp:    Temp:    SpO2:       General: Adult female in bed in no acute distress at rest HEENT: Normocephalic atraumatic Eyes: Extraocular movements intact sclera anicteric Neck: Supple trachea midline Heart: S1-S2 no rub blood pressure 103/69 heart rate 90 Lungs: Clear and unlabored on exam on 2 L of oxygen at 97% Abdomen: Soft nontender nondistended Extremities: No edema appreciated no cyanosis or clubbing Skin: No rash on extremities exposed Neuro: Patient is asleep on arrival and difficult to arouse.  She briefly wakes up and is not able to answer questions Psych no anxiety or agitation  Assessment/Plan:  # AKI  - ischemic ATN and pre-renal insults with septic shock.  Note mild fullness of the right renal collecting system without obstructing focus evident. No collecting system fullness on the left - continue supportive care - Transition from LR to normal saline - NS at 75 ml/hr x 24 hours and reassess - Note that per her last outpatient nephrology visit earlier this month plans were for recommendation of conservative kidney management in light of her dementia  # Sepsis - abx per primary team   - blood cultures NGTD - gentle hydration as above  # Encephalopathy  -  Setting of dementia  - Patient not able to provide hx of my exam but was awake earlier per her daughter - s/p fall and with AKI  - supportive measures as above to optimize  # Hyperkalemia - transition from LR to normal saline  # UTI  - abx per primary team - urine cx in process    # Fall at home  - work-up per primary team; note presenting hypotension   # Anemia CKD - Per the CBC today anemia is mild and not acute issue at this time. Reading of 7.5 from istat it appears  # Troponin elevation  - per primary team - they have contacted cardiology  Claudia Desanctis 05/15/2020, 4:00 PM

## 2020-05-15 NOTE — ED Provider Notes (Signed)
[facility] EMERGENCY DEPARTMENT Provider Note   CSN: [id number] Arrival date & time: 05/14/20  2308     History Chief Complaint  Patient presents with  . unresponsive    Erin Liu is a 80 y.o. female.  80 year old female with a history of anemia, diabetes, hyperlipidemia, hypertension, renal failure not on dialysis who presents emerged from today after a fall.  History is gotten from EMS who got it from the son.  Patient not able to offer any history at this time secondary to mental status.  Apparently patient was last seen well around 7:00.  She had supper she was noted she been acting herself.  Then around 110 family heard a thud.  They went up to her room and she was unresponsive on the floor.  EMS was called.  On arrival patient was reportedly unresponsive.  Unknown blood sugar or vital signs they started bagging her and brought her here for further evaluation.        Past Medical History:  Diagnosis Date  . ANEMIA, NORMOCYTIC   . Arthritis    back  . BREAST CYST, HX OF   . CARPAL TUNNEL SYNDROME, BILATERAL   . DIABETES MELLITUS, TYPE II   . Glaucoma   . HYPERLIPIDEMIA   . HYPERTENSION   . OBESITY   . PERIPHERAL NEUROPATHY   . RENAL FAILURE, CHRONIC    Stage 4    Patient Active Problem List   Diagnosis Date Noted  . Renal failure (ARF), acute on chronic (HCC) 05/15/2020  . Hypotension 05/15/2020  . Hypothermia 05/15/2020  . CAP (community acquired pneumonia) 05/15/2020  . Acute metabolic encephalopathy 50/53/9767  . Sepsis ([place]) 05/15/2020  . Fall at home, initial encounter 05/15/2020  . Acute renal failure ([place]) 05/15/2020  . Prolonged QT interval 05/15/2020  . Elevated lactic acid level 05/15/2020  . Recurrent falls 04/28/2020  . Weight loss, unintentional 09/11/2019  . Rash 07/18/2019  . Memory disorder 07/18/2019  . Hand tingling 07/24/2018  . Physical deconditioning 01/13/2018  . Tremor of both hands 10/10/2017  . Urinary  incontinence 10/10/2017  . Vasovagal syncope 10/02/2017  . Gout 07/12/2017  . B12 deficiency 04/11/2017  . Cough 08/26/2015  . Lumbago 02/25/2015  . Essential tremor   . Glaucoma 01/08/2010  . Chronic kidney disease (CKD), stage IV (severe) ([place]) 12/10/2008  . DM (diabetes mellitus), secondary, uncontrolled, with renal complications ([place]) 34/19/3790  . Diabetic neuropathy ([place]) 02/22/2006  . Dyslipidemia 02/22/2006  . 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 CARPAL TUNNEL SYNDROME, BILATERAL 02/22/2006    Past Surgical History:  Procedure Laterality Date  . AV FISTULA PLACEMENT Left 05/23/2018   Procedure: ARTERIOVENOUS (AV) FISTULA CREATION LEFT ARM;  Surgeon: Waynetta Sandy, MD;  Location: [place];  Service: Vascular;  Laterality: Left;  . BASCILIC VEIN TRANSPOSITION Left 08/03/2018   Procedure: SECOND STAGE BASILIC VEIN TRANSPOSITION LEFT ARM;  Surgeon: Waynetta Sandy, MD;  Location: [place];  Service: Vascular;  Laterality: Left;  . CATARACT EXTRACTION BILATERAL W/ ANTERIOR VITRECTOMY  05/2010  . COLONOSCOPY     [facility]/7-8 years     OB History   No obstetric history on file.     Family History  Problem Relation Age of Onset  . Arthritis Son   . Diabetes Son   . Hypertension Son   . Colon cancer Son 71  . Neuropathy Son        chemotherapy related  . Heart attack Mother  Died age 96s  . Heart attack Father        No details 65s  . Heart disease Sister        Slight heart attack, died age 49    Social History   Tobacco Use  . Smoking status: Never Smoker  . Smokeless tobacco: Never Used  Vaping Use  . Vaping Use: Never used  Substance Use Topics  . Alcohol use: No  . Drug use: No    Home Medications Prior to Admission medications   Medication Sig Start Date End Date Taking? Authorizing Provider  allopurinol (ZYLOPRIM) 100 MG tablet TAKE 2 TABLETS BY MOUTH DAILY 05/13/20   Binnie Rail, MD  atorvastatin (LIPITOR) 40 MG tablet TAKE 1 TABLET  BY MOUTH DAILY 11/05/19   Binnie Rail, MD  Cyanocobalamin (VITAMIN B-12) 2000 MCG TBCR Take 1 tablet (2,000 mcg total) by mouth daily. 07/24/18   Binnie Rail, MD  donepezil (ARICEPT) 5 MG tablet Take 1 tablet (5 mg total) by mouth at bedtime. 04/28/20   Binnie Rail, MD  furosemide (LASIX) 40 MG tablet Take 40 mg by mouth every other day. 02/10/20   [provider]  latanoprost (XALATAN) 0.005 % ophthalmic solution Place 1 drop into both eyes at bedtime. 12/09/17   [provider]    Allergies    Lisinopril  Review of Systems   Review of Systems  Unable to perform ROS: Mental status change    Physical Exam Updated Vital Signs BP 114/79 (BP Location: Right Arm)   Pulse (!) 103   Temp (!) 96.5 F (35.8 C) (Rectal)   Resp 20   SpO2 96%   Physical Exam Vitals and nursing note reviewed.  Constitutional:      Appearance: She is well-developed and well-nourished.     Comments: Patient responds to pain and loud voice  HENT:     Head: Normocephalic and atraumatic.     Mouth/Throat:     Mouth: Mucous membranes are moist.     Pharynx: Oropharynx is clear.  Eyes:     Pupils: Pupils are equal, round, and reactive to light.  Cardiovascular:     Rate and Rhythm: Normal rate and regular rhythm.  Pulmonary:     Effort: Pulmonary effort is normal. No respiratory distress.     Breath sounds: No stridor.  Abdominal:     General: Abdomen is flat. There is no distension.  Musculoskeletal:        General: Normal range of motion.     Cervical back: Normal range of motion.  Skin:    General: Skin is warm and dry.  Neurological:     Comments: Unable to assess.     ED Results / Procedures / Treatments   Labs (all labs ordered are listed, but only abnormal results are displayed) Labs Reviewed  COMPREHENSIVE METABOLIC PANEL - Abnormal; Notable for the following components:      Result Value   CO2 14 (*)    Glucose, Bld 169 (*)    BUN 81 (*)    Creatinine, Ser  4.41 (*)    Total Protein 6.3 (*)    Albumin 3.4 (*)    AST 99 (*)    ALT 46 (*)    Total Bilirubin 1.8 (*)    GFR, Estimated 10 (*)    Anion gap 19 (*)    All other components within normal limits  LACTIC ACID, PLASMA - Abnormal; Notable for the following components:  Lactic Acid, Venous 7.2 (*)    All other components within normal limits  LACTIC ACID, PLASMA - Abnormal; Notable for the following components:   Lactic Acid, Venous 5.9 (*)    All other components within normal limits  CBC WITH DIFFERENTIAL/PLATELET - Abnormal; Notable for the following components:   MCV 102.0 (*)    RDW 19.1 (*)    Platelets 80 (*)    nRBC 4.6 (*)    All other components within normal limits  PROTIME-INR - Abnormal; Notable for the following components:   Prothrombin Time 15.3 (*)    INR 1.3 (*)    All other components within normal limits  CK - Abnormal; Notable for the following components:   Total CK 241 (*)    All other components within normal limits  CBG MONITORING, ED - Abnormal; Notable for the following components:   Glucose-Capillary 134 (*)    All other components within normal limits  I-STAT CHEM 8, ED - Abnormal; Notable for the following components:   Sodium 146 (*)    Potassium 3.1 (*)    Chloride 120 (*)    BUN 65 (*)    Creatinine, Ser 1.80 (*)    Calcium, Ion 0.68 (*)    TCO2 11 (*)    Hemoglobin 7.5 (*)    HCT 22.0 (*)    All other components within normal limits  CULTURE, BLOOD (ROUTINE X 2)  CULTURE, BLOOD (ROUTINE X 2)  URINE CULTURE  RESP PANEL BY RT-PCR (FLU A&B, COVID) ARPGX2  URINALYSIS, ROUTINE W REFLEX MICROSCOPIC  PATHOLOGIST SMEAR REVIEW  TSH  PROTIME-INR  PROCALCITONIN  CBC  COMPREHENSIVE METABOLIC PANEL  TROPONIN I (HIGH SENSITIVITY)    EKG EKG Interpretation  Date/Time:  Wednesday May 14 2020 23:14:06 EST Ventricular Rate:  134 PR Interval:    QRS Duration: 166 QT Interval:  389 QTC Calculation: 581 R Axis:   -41 Text  Interpretation: Junctional tachycardia Left bundle branch block Confirmed by Merrily Pew [phone]) on 05/14/2020 11:21:52 PM   Radiology CT Head Wo Contrast  Result Date: 05/15/2020 CLINICAL DATA:  Facial trauma, neck trauma EXAM: CT HEAD WITHOUT CONTRAST CT CERVICAL SPINE WITHOUT CONTRAST TECHNIQUE: Multidetector CT imaging of the head and cervical spine was performed following the standard protocol without intravenous contrast. Multiplanar CT image reconstructions of the cervical spine were also generated. COMPARISON:  CT head 10/02/2017 FINDINGS: CT HEAD FINDINGS Brain: Normal anatomic configuration. Parenchymal volume loss is commensurate with the patient's age. Moderate periventricular white matter changes are present likely reflecting the sequela of small vessel ischemia. Remote lacunar infarcts are noted within the right thalamus, right basal ganglia, left caudate nucleus, and right centrum semiovale. Small infarct within the centrum semiovale appears new since prior examination, though is remote in appearance. No abnormal intra or extra-axial mass lesion or fluid collection. No abnormal mass effect or midline shift. No evidence of acute intracranial hemorrhage or infarct. Ventricular size is normal. Cerebellum unremarkable. Vascular: No asymmetric hyperdense vasculature at the skull base. Skull: Intact Sinuses/Orbits: Paranasal sinuses are clear. Orbits are unremarkable. Other: Mastoid air cells and middle ear cavities are clear. Mild right supraorbital soft tissue swelling. CT CERVICAL SPINE FINDINGS Alignment: Straightening of the cervical spine may be positional in nature. No listhesis. Skull base and vertebrae: No acute fracture. No primary bone lesion or focal pathologic process. Soft tissues and spinal canal: No prevertebral fluid or swelling. No visible canal hematoma. Disc levels: There is intervertebral disc space narrowing and endplate remodeling  at C3-4 in keeping with changes of mild  degenerative disc disease. Mild endplate changes are also noted at C4-5 and C5-6 in keeping with minimal degenerative disc disease at these levels. The spinal canal is widely patent. The prevertebral soft tissues are not thickened on sagittal reformats. Review of the axial images demonstrates mild bilateral neuroforaminal narrowing, left greater than right, at C3-4 secondary to uncovertebral arthrosis. Upper chest: Small left pleural effusion. Asymmetric soft tissue swelling is seen involving the visualized left anterior chest wall. Other: None IMPRESSION: No acute intracranial abnormality. No calvarial fracture. Mild right supraorbital soft tissue swelling. No acute fracture or listhesis of the cervical spine. Asymmetric left anterior chest wall soft tissue swelling, incompletely evaluated on this examination. Small left pleural effusion also identified. Electronically Signed   By: Fidela Salisbury MD   On: 05/15/2020 00:08   CT Cervical Spine Wo Contrast  Result Date: 05/15/2020 CLINICAL DATA:  Facial trauma, neck trauma EXAM: CT HEAD WITHOUT CONTRAST CT CERVICAL SPINE WITHOUT CONTRAST TECHNIQUE: Multidetector CT imaging of the head and cervical spine was performed following the standard protocol without intravenous contrast. Multiplanar CT image reconstructions of the cervical spine were also generated. COMPARISON:  CT head 10/02/2017 FINDINGS: CT HEAD FINDINGS Brain: Normal anatomic configuration. Parenchymal volume loss is commensurate with the patient's age. Moderate periventricular white matter changes are present likely reflecting the sequela of small vessel ischemia. Remote lacunar infarcts are noted within the right thalamus, right basal ganglia, left caudate nucleus, and right centrum semiovale. Small infarct within the centrum semiovale appears new since prior examination, though is remote in appearance. No abnormal intra or extra-axial mass lesion or fluid collection. No abnormal mass effect or  midline shift. No evidence of acute intracranial hemorrhage or infarct. Ventricular size is normal. Cerebellum unremarkable. Vascular: No asymmetric hyperdense vasculature at the skull base. Skull: Intact Sinuses/Orbits: Paranasal sinuses are clear. Orbits are unremarkable. Other: Mastoid air cells and middle ear cavities are clear. Mild right supraorbital soft tissue swelling. CT CERVICAL SPINE FINDINGS Alignment: Straightening of the cervical spine may be positional in nature. No listhesis. Skull base and vertebrae: No acute fracture. No primary bone lesion or focal pathologic process. Soft tissues and spinal canal: No prevertebral fluid or swelling. No visible canal hematoma. Disc levels: There is intervertebral disc space narrowing and endplate remodeling at N2-3 in keeping with changes of mild degenerative disc disease. Mild endplate changes are also noted at C4-5 and C5-6 in keeping with minimal degenerative disc disease at these levels. The spinal canal is widely patent. The prevertebral soft tissues are not thickened on sagittal reformats. Review of the axial images demonstrates mild bilateral neuroforaminal narrowing, left greater than right, at C3-4 secondary to uncovertebral arthrosis. Upper chest: Small left pleural effusion. Asymmetric soft tissue swelling is seen involving the visualized left anterior chest wall. Other: None IMPRESSION: No acute intracranial abnormality. No calvarial fracture. Mild right supraorbital soft tissue swelling. No acute fracture or listhesis of the cervical spine. Asymmetric left anterior chest wall soft tissue swelling, incompletely evaluated on this examination. Small left pleural effusion also identified. Electronically Signed   By: Fidela Salisbury MD   On: 05/15/2020 00:08   DG Chest Portable 1 View  Result Date: 05/14/2020 CLINICAL DATA:  Fall, found unresponsive EXAM: PORTABLE CHEST 1 VIEW COMPARISON:  Radiograph 07/18/2019 FINDINGS: Enlarged, lobular appearance of  the cardiac silhouette, can reflect cardiomegaly or pericardial effusion. The aorta is calcified. The remaining cardiomediastinal contours are unremarkable. Some hazy basilar predominant opacities may  reflect atelectasis and or early interstitial edema given some vascular congestion with fissural and septal thickening. No pneumothorax. No visible layering effusion. No acute traumatic abnormality of the chest wall. Degenerative changes present in the shoulders and spine. Telemetry leads overlie the chest. IMPRESSION: 1. Enlarged, lobular appearance of the cardiac silhouette, can reflect cardiomegaly or pericardial effusion. 2. Some hazy basilar predominant opacities may reflect atelectasis and/or early interstitial edema given some vascular congestion with fissural and septal thickening. Electronically Signed   By: Lovena Le M.D.   On: 05/14/2020 23:34    Procedures .Critical Care Performed by: Merrily Pew, MD Authorized by: Merrily Pew, MD   Critical care provider statement:    Critical care time (minutes):  45   Critical care was necessary to treat or prevent imminent or life-threatening deterioration of the following conditions:  Renal failure, shock, dehydration and CNS failure or compromise   Critical care was time spent personally by me on the following activities:  Discussions with consultants, evaluation of patient's response to treatment, examination of patient, ordering and performing treatments and interventions, ordering and review of laboratory studies, ordering and review of radiographic studies, pulse oximetry, re-evaluation of patient's condition, obtaining history from patient or surrogate and review of old charts     Medications Ordered in ED Medications  lactated ringers infusion ( Intravenous New Bag/Given 05/15/20 0354)  atorvastatin (LIPITOR) tablet 40 mg (has no administration in time range)  latanoprost (XALATAN) 0.005 % ophthalmic solution 1 drop (has no  administration in time range)  heparin injection 5,000 Units (has no administration in time range)  lactated ringers infusion (has no administration in time range)  cefTRIAXone (ROCEPHIN) 1 g in sodium chloride 0.9 % 100 mL IVPB (has no administration in time range)  doxycycline (VIBRAMYCIN) 100 mg in sodium chloride 0.9 % 250 mL IVPB (has no administration in time range)  acetaminophen (TYLENOL) tablet 650 mg (has no administration in time range)    Or  acetaminophen (TYLENOL) suppository 650 mg (has no administration in time range)  senna-docusate (Senokot-S) tablet 1 tablet (has no administration in time range)  promethazine (PHENERGAN) injection 12.5 mg (has no administration in time range)  lactated ringers bolus 1,000 mL (0 mLs Intravenous Stopped 05/15/20 0237)  calcium gluconate 1 g/ 50 mL sodium chloride IVPB (0 g Intravenous Stopped 05/15/20 0222)  cefTRIAXone (ROCEPHIN) 1 g in sodium chloride 0.9 % 100 mL IVPB (0 g Intravenous Stopped 05/15/20 0136)  azithromycin (ZITHROMAX) 500 mg in sodium chloride 0.9 % 250 mL IVPB (0 mg Intravenous Stopped 05/15/20 0237)  lactated ringers bolus 1,000 mL (0 mLs Intravenous Stopped 05/15/20 0237)    ED Course  I have reviewed the triage vital signs and the nursing notes.  Pertinent labs & imaging results that were available during my care of the patient were reviewed by me and considered in my medical decision making (see chart for details).    MDM Rules/Calculators/A&P                         Initially patient minimally responsive.  Considered intubation however she seemed to be oxygenating well.  She was watched very closely and I reevaluated her multiple times her mental status continued to improve as her blood pressure improved and heart rate improved.  She was ultimately able to answer yes or no questions and follow commands without any obvious neurologic deficit.  Her daughter arrived to stated that the patient was returning close to  normal.   Labs show lactic acid of 7 so that will need to be rechecked after some fluids.  I suspect this is probably accounting for part of her acidosis on her BMP.  She is also hypocalcemic so we will replete that as well.  She did have a low temperature low blood pressure and heart rate.  I think is probably more from exposure after the fall and hypovolemia however will cover with antibiotics pending her cultures secondary to that x-ray results.  Pending urine results still. Had 500cc fluid PTA, 1000 not documented + 1000 that is documented and starting at 0051.   After greater than 4 L of fluid I discussed with critical care regarding her improving mental status, improving blood pressures, improving heart rate and improving labs.  He thought that the patient was stable to be admitted to stepdown with medicine.  I discussed with medicine who admitted.  Family updated.  Patient full code at this time.  Discussed with neurology, Dr. Jari Pigg, who will put her on the list to be seen in the morning.  Still not making urine.  Final Clinical Impression(s) / ED Diagnoses Final diagnoses:  ARF (acute renal failure) ([place])    Rx / DC Orders ED Discharge Orders    None       Affan Callow, Corene Cornea, MD 05/15/20 [phone]

## 2020-05-15 NOTE — Progress Notes (Addendum)
Same day note  Patient seen and examined at bedside.  Patient was admitted to the hospital for fall with unresponsiveness  At the time of my evaluation, patient complains of feeling little better.  Was alert awake and communicative with her daughter at bedside. Denies any pain, nausea, vomiting, fever or chills.  Denies chest pain.  Physical examination reveals female resting quietly in bed, communicative.  Alert awake and answering questions.  Chest clear.  Trace edema.  Vitals with BMI 05/15/2020 05/15/2020 05/15/2020  Height - - -  Weight - - -  BMI - - -  Systolic 99 078 675  Diastolic 63 66 71  Pulse - - -   Laboratory data and imaging was reviewed  Assessment and Plan.  Acute kidney injury on chronic kidney disease stage IV.   Baseline creatinine of around 2.6.  Creatinine of 4.4 on presentation.  Continue IV fluids.  Renal ultrasound.  Nephrology has been consulted.  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  We will continue to monitor BMP.  Severe sepsis likely secondary to community-acquired pneumonia with acute hypoxic respiratory failure.. Meets sepsis criteria with altered mental status, tachycardia, hypotension, hypothermia, elevated lactic acid over 5 and acute kidney injury for end organ dysfunction with probable CAP on CXR.  Continue Rocephin and doxycycline.  Follow urine and blood cultures cultures.  UA with large protein leukocytes hemoglobin and WBC.  On supplemental oxygen at 5 L/min.  We will continue to monitor lactate.  Lactate trending down.  Continue IV fluid hydration.    Hypotension Received fluid bolus.  Starting to improve.  Continue to hold antihypertensives.  New Ringer lactate for now.  Significantly elevated troponin. Could be secondary to sepsis, acute kidney injury, will check 2D echo for evaluation abnormality.  Patient denies any chest pain.  Patient does have history of LBBB.  Will get cardiology opinion.   Troponin more than 7000x2 .    Hypothermia On presentation today, required [facility].  Latest temperature of 96.5 F.   Acute metabolic encephalopathy Likely secondary to acute kidney injury and sepsis.  With baseline dementia.  There was some concern for slurred speech as well.  CT of the head did not show any hemorrhage or acute stroke.    MRI of the brain was degraded study but most compatible with chronic small vessel disease.    Fall at home, initial encounter Continue precautions.  Will need PT evaluation when more stable..    Prolonged QT interval Correct electrolytes.  Avoid QT prolonging agents.  Spoke with the patient's daughter [name] at bedside.  Answered all the queries she had.  Addendum:  05/15/2020 5:37 PM  I spoke with Dr Oval Linsey, cardiology for consult due to rising troponin levels No Charge  Signed,  Delila Pereyra, MD [facility]

## 2020-05-15 NOTE — ED Notes (Signed)
bl[place] patient 109 mg

## 2020-05-15 NOTE — Progress Notes (Signed)
Cardiology Consultation:   Patient ID: Erin Liu MRN: [id number]; DOB: [birth year]  Admit date: 05/14/2020 Date of Consult: 05/15/2020  PCP:  Binnie Rail, MD   [place]  Cardiologist:  Minus Breeding, MD [id number]}    Patient Profile:   Erin Liu is a 80 y.o. female with a hx of diabetes, hypertension, LBBB, dementia, and CKD IV who is being seen today for the evaluation of elevated troponin at the request of Dr. Louanne Belton.  History of Present Illness:   Ms. Erin Liu came to the ED via EMS after a fall at home. She is unable to provide any history as she does not recall the events.  Reportedly, she was trying to get out of bed to go to the bathroom and fell.  Family heard her fall and when they arrived she was on the floor unresponsive.  They called EMS and she remained unresponsive.  In the ED she was hypothermic and hypotensive.  Head CT was non-acute.  Creatinine was up to 4.4 from her baseline 2.6.  Latate was 5 and she was aneuric.  CXR concerning for CAP vs. Vascular congestion.  U/A consistent with UTI.  She was started on empiric antibiotics.  Hs-troponin is elevated to 7635-->7341-->8905 so cardiology was consulted.  She denies chest pain or shortness of breath.  Her daughter has noticed some swelling in her R leg.  She denies orthopnea or PND.  Her daughter has noted memory loss and she started on Aricept 2-3 weeks ago. She was seen in the ED by nephrology and there is consensus that she would not be a good candidate for dialysis given her memory loss and functional decline.   [name] was seen in the ED by cardiology in 2019 for unresponsiveness.  She did not fully lose consciousness and there was some question of seizure-like activity.  Echo at that time revealed LVEF 50-55% with grade 1 diastolic dysfunction.  It was thought to be a vagal episode and no further work up was advised.   Past Medical History:  Diagnosis Date  . ANEMIA, NORMOCYTIC    . Arthritis    back  . BREAST CYST, HX OF   . CARPAL TUNNEL SYNDROME, BILATERAL   . DIABETES MELLITUS, TYPE II   . Glaucoma   . HYPERLIPIDEMIA   . HYPERTENSION   . OBESITY   . PERIPHERAL NEUROPATHY   . RENAL FAILURE, CHRONIC    Stage 4    Past Surgical History:  Procedure Laterality Date  . AV FISTULA PLACEMENT Left 05/23/2018   Procedure: ARTERIOVENOUS (AV) FISTULA CREATION LEFT ARM;  Surgeon: Waynetta Sandy, MD;  Location: [facility];  Service: Vascular;  Laterality: Left;  . BASCILIC VEIN TRANSPOSITION Left 08/03/2018   Procedure: SECOND STAGE BASILIC VEIN TRANSPOSITION LEFT ARM;  Surgeon: Waynetta Sandy, MD;  Location: [facility];  Service: Vascular;  Laterality: Left;  . CATARACT EXTRACTION BILATERAL W/ ANTERIOR VITRECTOMY  05/2010  . COLONOSCOPY     [facility]/7-8 years     Home Medications:  Prior to Admission medications   Medication Sig Start Date End Date Taking? Authorizing Provider  acetaminophen (TYLENOL) 500 MG tablet Take 500 mg by mouth 2 (two) times daily.   Yes [provider]  allopurinol (ZYLOPRIM) 100 MG tablet TAKE 2 TABLETS BY MOUTH DAILY Patient taking differently: Take 200 mg by mouth daily. 05/13/20  Yes Burns, Claudina Lick, MD  atorvastatin (LIPITOR) 40 MG tablet TAKE 1 TABLET BY MOUTH DAILY  Patient taking differently: Take 40 mg by mouth daily. 11/05/19  Yes Burns, Claudina Lick, MD  Cyanocobalamin (VITAMIN B-12) 2000 MCG TBCR Take 1 tablet (2,000 mcg total) by mouth daily. 07/24/18  Yes Burns, Claudina Lick, MD  donepezil (ARICEPT) 5 MG tablet Take 1 tablet (5 mg total) by mouth at bedtime. 04/28/20  Yes Burns, Claudina Lick, MD  furosemide (LASIX) 40 MG tablet Take 40 mg by mouth every other day. 02/10/20  Yes [provider]  latanoprost (XALATAN) 0.005 % ophthalmic solution Place 1 drop into both eyes at bedtime. 12/09/17  Yes [provider]    Inpatient Medications: Scheduled Meds: . atorvastatin  40 mg Oral Daily  . heparin  5,000 Units  Subcutaneous Q8H  . latanoprost  1 drop Both Eyes QHS   Continuous Infusions: . sodium chloride 75 mL/hr at 05/15/20 1622  . cefTRIAXone (ROCEPHIN)  IV    . doxycycline (VIBRAMYCIN) IV Stopped (05/15/20 0925)   PRN Meds: acetaminophen **OR** acetaminophen, promethazine, senna-docusate  Allergies:    Allergies  Allergen Reactions  . Lisinopril Cough    Social History:   Social History   Socioeconomic History  . Marital status: Widowed    Spouse name: Not on file  . Number of children: 3  . Years of education: Not on file  . Highest education level: Not on file  Occupational History  . Not on file  Tobacco Use  . Smoking status: Never Smoker  . Smokeless tobacco: Never Used  Vaping Use  . Vaping Use: Never used  Substance and Sexual Activity  . Alcohol use: No  . Drug use: No  . Sexual activity: Not Currently  Other Topics Concern  . Not on file  Social History Narrative   Widowed, lives with son. Retired Medical illustrator   Social Determinants of Radio broadcast assistant Strain: [place]   . Difficulty of Paying Living Expenses: Not hard at all  Food Insecurity: No Food Insecurity  . Worried About Charity fundraiser in the Last Year: Never true  . Ran Out of Food in the Last Year: Never true  Transportation Needs: No Transportation Needs  . Lack of Transportation (Medical): No  . Lack of Transportation (Non-Medical): No  Physical Activity: Inactive  . Days of Exercise per Week: 0 days  . Minutes of Exercise per Session: 0 min  Stress: No Stress Concern Present  . Feeling of Stress : Not at all  Social Connections: Unknown  . Frequency of Communication with Friends and Family: More than three times a week  . Frequency of Social Gatherings with Friends and Family: Once a week  . Attends Religious Services: Patient refused  . Active Member of Clubs or Organizations: Patient refused  . Attends Archivist Meetings: Patient refused  . Marital Status:  Widowed  Intimate Partner Violence: Not on file    Family History:   Family History  Problem Relation Age of Onset  . Arthritis Son   . Diabetes Son   . Hypertension Son   . Colon cancer Son 61  . Neuropathy Son        chemotherapy related  . Heart attack Mother        Died age 70s  . Heart attack Father        No details 71s  . Heart disease Sister        Slight heart attack, died age 43     ROS:  Please see the history  of present illness.   All other ROS reviewed and negative.     Physical Exam/Data:   Vitals:   05/15/20 1700 05/15/20 1745 05/15/20 1800 05/15/20 1815  BP: 104/90 108/63 108/63 113/80  Pulse:   97   Resp:   18   Temp:      TempSrc:      SpO2:   96%   Weight:      Height:        Intake/Output Summary (Last 24 hours) at 05/15/2020 1845 Last data filed at 05/15/2020 1541 Gross per 24 hour  Intake 2257.08 ml  Output -  Net 2257.08 ml   Last 3 Weights 05/15/2020 04/28/2020 04/04/2020  Weight (lbs) 175 lb 174 lb 160 lb  Weight (kg) 79.379 kg 78.926 kg 72.576 kg     VS:  BP 113/80   Pulse 97   Temp (!) 97.1 F (36.2 C) (Temporal)   Resp 18   Ht 5\' 5"  (1.651 m)   Wt 79.4 kg   SpO2 96%   BMI 29.12 kg/m  , BMI Body mass index is 29.12 kg/m. GENERAL:  Chronically ill-appearing.  Frail HEENT: Pupils equal round and reactive, fundi not visualized, oral mucosa unremarkable NECK:  No jugular venous distention, waveform within normal limits, carotid upstroke brisk and symmetric, no bruits LUNGS:  Clear to auscultation bilaterally HEART:  RRR.  PMI not displaced or sustained,S1 and S2 within normal limits, no S3, no S4, no clicks, no rubs, no murmurs ABD:  Flat, positive bowel sounds normal in frequency in pitch, no bruits, no rebound, no guarding, no midline pulsatile mass, no hepatomegaly, no splenomegaly EXT:  2 plus pulses throughout, 2+ LE edema, no cyanosis no clubbing SKIN:  No rashes no nodules NEURO:  Cranial nerves II through XII grossly  intact, motor grossly intact throughout PSYCH:  Cognitively intact, oriented to person place and time   EKG:  The EKG was personally reviewed and demonstrates:  Sinus tachycardia.  Rate 134 bpm.  1st degree AV block.  LBBB Telemetry:  Telemetry was personally reviewed and demonstrates:  Sinus rhythm, sinus tachycardia  Relevant CV Studies: Echo pending  Echo 10/03/17: Study Conclusions   - Left ventricle: The cavity size was normal. There was mild  concentric hypertrophy. Systolic function was normal. The  estimated ejection fraction was in the range of 50% to 55%. Wall  motion was normal; there were no regional wall motion  abnormalities. There was an increased relative contribution of  atrial contraction to ventricular filling. Doppler parameters are  consistent with abnormal left ventricular relaxation (grade 1  diastolic dysfunction). Doppler parameters are consistent with  high ventricular filling pressure.  - Ventricular septum: Septal motion showed moderate paradox. These  changes are consistent with intraventricular conduction delay.  - Mitral valve: Calcified annulus. There was mild regurgitation.  - Tricuspid valve: There was mild regurgitation.  - Pulmonary arteries: Systolic pressure could not be accurately  estimated.  Laboratory Data:  High Sensitivity Troponin:   Recent Labs  Lab 05/15/20 0637 05/15/20 0922 05/15/20 1550  TROPONINIHS 7,635* 7,341* 8,905*     Chemistry Recent Labs  Lab 05/14/20 2317 05/14/20 2338 05/15/20 0637  NA 136 146* 137  K 5.1 3.1* 5.2*  CL 103 120* 103  CO2 14*  --  20*  GLUCOSE 169* 85 149*  BUN 81* 65* 75*  CREATININE 4.41* 1.80* 4.19*  CALCIUM 9.8  --  9.6  GFRNONAA 10*  --  10*  ANIONGAP 19*  --  14    Recent Labs  Lab 05/14/20 2317 05/15/20 0637  PROT 6.3* 6.0*  ALBUMIN 3.4* 3.0*  AST 99* 98*  ALT 46* 48*  ALKPHOS 102 90  BILITOT 1.8* 1.6*   Hematology Recent Labs  Lab 05/14/20 2317  05/14/20 2338 05/15/20 0736  WBC 6.1  --  5.2  RBC 3.94  --  3.57*  HGB 12.6 7.5* 11.3*  HCT 40.2 22.0* 35.6*  MCV 102.0*  --  99.7  MCH 32.0  --  31.7  MCHC 31.3  --  31.7  RDW 19.1*  --  18.9*  PLT 80*  --  66*   BNPNo results for input(s): BNP, PROBNP in the last 168 hours.  DDimer No results for input(s): DDIMER in the last 168 hours.   Radiology/Studies:  CT Head Wo Contrast  Result Date: 05/15/2020 CLINICAL DATA:  Facial trauma, neck trauma EXAM: CT HEAD WITHOUT CONTRAST CT CERVICAL SPINE WITHOUT CONTRAST TECHNIQUE: Multidetector CT imaging of the head and cervical spine was performed following the standard protocol without intravenous contrast. Multiplanar CT image reconstructions of the cervical spine were also generated. COMPARISON:  CT head 10/02/2017 FINDINGS: CT HEAD FINDINGS Brain: Normal anatomic configuration. Parenchymal volume loss is commensurate with the patient's age. Moderate periventricular white matter changes are present likely reflecting the sequela of small vessel ischemia. Remote lacunar infarcts are noted within the right thalamus, right basal ganglia, left caudate nucleus, and right centrum semiovale. Small infarct within the centrum semiovale appears new since prior examination, though is remote in appearance. No abnormal intra or extra-axial mass lesion or fluid collection. No abnormal mass effect or midline shift. No evidence of acute intracranial hemorrhage or infarct. Ventricular size is normal. Cerebellum unremarkable. Vascular: No asymmetric hyperdense vasculature at the skull base. Skull: Intact Sinuses/Orbits: Paranasal sinuses are clear. Orbits are unremarkable. Other: Mastoid air cells and middle ear cavities are clear. Mild right supraorbital soft tissue swelling. CT CERVICAL SPINE FINDINGS Alignment: Straightening of the cervical spine may be positional in nature. No listhesis. Skull base and vertebrae: No acute fracture. No primary bone lesion or focal  pathologic process. Soft tissues and spinal canal: No prevertebral fluid or swelling. No visible canal hematoma. Disc levels: There is intervertebral disc space narrowing and endplate remodeling at M3-5 in keeping with changes of mild degenerative disc disease. Mild endplate changes are also noted at C4-5 and C5-6 in keeping with minimal degenerative disc disease at these levels. The spinal canal is widely patent. The prevertebral soft tissues are not thickened on sagittal reformats. Review of the axial images demonstrates mild bilateral neuroforaminal narrowing, left greater than right, at C3-4 secondary to uncovertebral arthrosis. Upper chest: Small left pleural effusion. Asymmetric soft tissue swelling is seen involving the visualized left anterior chest wall. Other: None IMPRESSION: No acute intracranial abnormality. No calvarial fracture. Mild right supraorbital soft tissue swelling. No acute fracture or listhesis of the cervical spine. Asymmetric left anterior chest wall soft tissue swelling, incompletely evaluated on this examination. Small left pleural effusion also identified. Electronically Signed   By: Fidela Salisbury MD   On: 05/15/2020 00:08   CT Cervical Spine Wo Contrast  Result Date: 05/15/2020 CLINICAL DATA:  Facial trauma, neck trauma EXAM: CT HEAD WITHOUT CONTRAST CT CERVICAL SPINE WITHOUT CONTRAST TECHNIQUE: Multidetector CT imaging of the head and cervical spine was performed following the standard protocol without intravenous contrast. Multiplanar CT image reconstructions of the cervical spine were also generated. COMPARISON:  CT head 10/02/2017 FINDINGS: CT HEAD FINDINGS  Brain: Normal anatomic configuration. Parenchymal volume loss is commensurate with the patient's age. Moderate periventricular white matter changes are present likely reflecting the sequela of small vessel ischemia. Remote lacunar infarcts are noted within the right thalamus, right basal ganglia, left caudate nucleus, and  right centrum semiovale. Small infarct within the centrum semiovale appears new since prior examination, though is remote in appearance. No abnormal intra or extra-axial mass lesion or fluid collection. No abnormal mass effect or midline shift. No evidence of acute intracranial hemorrhage or infarct. Ventricular size is normal. Cerebellum unremarkable. Vascular: No asymmetric hyperdense vasculature at the skull base. Skull: Intact Sinuses/Orbits: Paranasal sinuses are clear. Orbits are unremarkable. Other: Mastoid air cells and middle ear cavities are clear. Mild right supraorbital soft tissue swelling. CT CERVICAL SPINE FINDINGS Alignment: Straightening of the cervical spine may be positional in nature. No listhesis. Skull base and vertebrae: No acute fracture. No primary bone lesion or focal pathologic process. Soft tissues and spinal canal: No prevertebral fluid or swelling. No visible canal hematoma. Disc levels: There is intervertebral disc space narrowing and endplate remodeling at T5-1 in keeping with changes of mild degenerative disc disease. Mild endplate changes are also noted at C4-5 and C5-6 in keeping with minimal degenerative disc disease at these levels. The spinal canal is widely patent. The prevertebral soft tissues are not thickened on sagittal reformats. Review of the axial images demonstrates mild bilateral neuroforaminal narrowing, left greater than right, at C3-4 secondary to uncovertebral arthrosis. Upper chest: Small left pleural effusion. Asymmetric soft tissue swelling is seen involving the visualized left anterior chest wall. Other: None IMPRESSION: No acute intracranial abnormality. No calvarial fracture. Mild right supraorbital soft tissue swelling. No acute fracture or listhesis of the cervical spine. Asymmetric left anterior chest wall soft tissue swelling, incompletely evaluated on this examination. Small left pleural effusion also identified. Electronically Signed   By: Fidela Salisbury MD   On: 05/15/2020 00:08   MR BRAIN WO CONTRAST  Result Date: 05/15/2020 CLINICAL DATA:  80 year old female status post fall. Unexplained altered mental status. EXAM: MRI HEAD WITHOUT CONTRAST TECHNIQUE: Multiplanar, multiecho pulse sequences of the brain and surrounding structures were obtained without intravenous contrast. COMPARISON:  Head CT 2335 hours yesterday.  Head CT 10/02/2017. FINDINGS: Study is intermittently degraded by motion artifact despite repeated imaging attempts. Diagnostic axial T1 weighted imaging could not be obtained. Brain: No restricted diffusion is evident. No midline shift, mass effect, or evidence of intracranial mass lesion. No ventriculomegaly. Normal basilar cisterns. Cervicomedullary junction appears negative. Patchy and confluent bilateral cerebral white matter T2 and FLAIR hyperintensity. Linear T2 and FLAIR hyperintensity in the right basal ganglia compatible with chronic lacunar infarct mild similar left basal ganglia signal changes. No cortical encephalomalacia or chronic cerebral blood products are evident. Brainstem and cerebellum appear relatively negative for age. Vascular: Major intracranial vascular flow voids are grossly preserved. Skull and upper cervical spine: Visualized bone marrow signal is within normal limits. Cervical spine obscured. Sinuses/Orbits: Postoperative changes to both globes. Paranasal Visualized paranasal sinuses and mastoids are stable and well pneumatized. Other: None. IMPRESSION: 1. Degraded by motion. No acute intracranial abnormality identified. 2. Signal changes compatible with moderate for age chronic small vessel disease. Electronically Signed   By: Genevie Ann M.D.   On: 05/15/2020 06:55   US RENAL  Result Date: 05/15/2020 CLINICAL DATA:  Acute renal failure EXAM: RENAL / URINARY TRACT ULTRASOUND COMPLETE COMPARISON:  Renal ultrasound November 21, 2014 FINDINGS: Right Kidney: Renal measurements: 9.5 x 4.5  x 5.5 cm = volume: 137  mL. Echogenicity and renal cortical thickness are within normal limits. No mass or perinephric fluid. There is mild fullness of the right renal collecting system. No sonographically demonstrable calculus or ureterectasis. Visualized. Left Kidney: Renal measurements: 8.5 x 4.9 x 5.1 cm = volume: 111 mL. Echogenicity and renal cortical thickness are within normal limits. No mass, perinephric fluid, or hydronephrosis visualized. No sonographically demonstrable calculus or ureterectasis. Bladder: Appears normal for degree of bladder distention. Other: None. IMPRESSION: 1. Kidneys rather small, particularly on the left, a finding that may be a function of age but also may be a function of medical renal disease. Renal echogenicity and cortical thickness normal. 2. There is mild fullness of the right renal collecting system without obstructing focus evident. No collecting system fullness on the left. No evident renal mass on either side. Electronically Signed   By: Lowella Grip III M.D.   On: 05/15/2020 09:10   DG Chest Portable 1 View  Result Date: 05/14/2020 CLINICAL DATA:  Fall, found unresponsive EXAM: PORTABLE CHEST 1 VIEW COMPARISON:  Radiograph 07/18/2019 FINDINGS: Enlarged, lobular appearance of the cardiac silhouette, can reflect cardiomegaly or pericardial effusion. The aorta is calcified. The remaining cardiomediastinal contours are unremarkable. Some hazy basilar predominant opacities may reflect atelectasis and or early interstitial edema given some vascular congestion with fissural and septal thickening. No pneumothorax. No visible layering effusion. No acute traumatic abnormality of the chest wall. Degenerative changes present in the shoulders and spine. Telemetry leads overlie the chest. IMPRESSION: 1. Enlarged, lobular appearance of the cardiac silhouette, can reflect cardiomegaly or pericardial effusion. 2. Some hazy basilar predominant opacities may reflect atelectasis and/or early interstitial  edema given some vascular congestion with fissural and septal thickening. Electronically Signed   By: Lovena Le M.D.   On: 05/14/2020 23:34     Assessment and Plan:   # Elevated troponin:  Troponin is markedly elevated to nearly 9000.  She has a chronic left bundle branch block.  She denies any chest pain or pressure.  I suspect this is demand ischemia in the setting of sepsis, traumatic fall, and acute on chronic renal failure.  Given her lack of chest pain we will not start heparin.  It is unclear what is going on with her hemoglobin.  It went from 12.6-7 0.5-11.3.  Suspect the 7.5 was spurious.  Echo is pending.  Discussed conservative management with her daughter.  Continue home atorvastatin and add low-dose beta-blocker as able.  # Unresponsiveness:  Unclear if this was syncope.  She is unable to stand for orthostatics.  Echo as above.  Continue on telemetry.    Risk Assessment/Risk Scores:                For questions or updates, please contact [place] Please consult [email or web address] for contact info under    Signed, Skeet Latch, MD  05/15/2020 6:45 PM

## 2020-05-15 NOTE — ED Notes (Signed)
Patient transported to Ultrasound 

## 2020-05-16 ENCOUNTER — Inpatient Hospital Stay (HOSPITAL_COMMUNITY): Payer: Medicare HMO

## 2020-05-16 DIAGNOSIS — I361 Nonrheumatic tricuspid (valve) insufficiency: Secondary | ICD-10-CM | POA: Diagnosis not present

## 2020-05-16 DIAGNOSIS — I340 Nonrheumatic mitral (valve) insufficiency: Secondary | ICD-10-CM | POA: Diagnosis not present

## 2020-05-16 DIAGNOSIS — R7989 Other specified abnormal findings of blood chemistry: Secondary | ICD-10-CM

## 2020-05-16 DIAGNOSIS — I5021 Acute systolic (congestive) heart failure: Secondary | ICD-10-CM

## 2020-05-16 LAB — COMPREHENSIVE METABOLIC PANEL
ALT: 52 U/L — ABNORMAL HIGH (ref 0–44)
AST: 109 U/L — ABNORMAL HIGH (ref 15–41)
Albumin: 3 g/dL — ABNORMAL LOW (ref 3.5–5.0)
Alkaline Phosphatase: 97 U/L (ref 38–126)
Anion gap: 11 (ref 5–15)
BUN: 76 mg/dL — ABNORMAL HIGH (ref 8–23)
CO2: 18 mmol/L — ABNORMAL LOW (ref 22–32)
Calcium: 9.3 mg/dL (ref 8.9–10.3)
Chloride: 106 mmol/L (ref 98–111)
Creatinine, Ser: 4.2 mg/dL — ABNORMAL HIGH (ref 0.44–1.00)
GFR, Estimated: 10 mL/min — ABNORMAL LOW (ref >60)
Glucose, Bld: 130 mg/dL — ABNORMAL HIGH (ref 70–99)
Potassium: 4.9 mmol/L (ref 3.5–5.1)
Sodium: 135 mmol/L (ref 135–145)
Total Bilirubin: 1.3 mg/dL — ABNORMAL HIGH (ref 0.3–1.2)
Total Protein: 5.6 g/dL — ABNORMAL LOW (ref 6.5–8.1)

## 2020-05-16 LAB — BLOOD CULTURE ID PANEL (REFLEXED) - BCID2

## 2020-05-16 LAB — URINE CULTURE: Culture: NO GROWTH

## 2020-05-16 LAB — CBC
HCT: 35.1 % — ABNORMAL LOW (ref 36.0–46.0)
Hemoglobin: 11.5 g/dL — ABNORMAL LOW (ref 12.0–15.0)
MCH: 32 pg (ref 26.0–34.0)
MCHC: 32.8 g/dL (ref 30.0–36.0)
MCV: 97.8 fL (ref 80.0–100.0)
Platelets: 77 10*3/uL — ABNORMAL LOW (ref 150–400)
RBC: 3.59 MIL/uL — ABNORMAL LOW (ref 3.87–5.11)
RDW: 18.9 % — ABNORMAL HIGH (ref 11.5–15.5)
WBC: 5.4 10*3/uL (ref 4.0–10.5)
nRBC: 5.8 % — ABNORMAL HIGH (ref 0.0–0.2)

## 2020-05-16 LAB — ECHOCARDIOGRAM COMPLETE
Height: 65 in
MV M vel: 4.65 m/s
MV Peak grad: 86.5 mmHg
Radius: 0.7 cm
S' Lateral: 5.8 cm
Weight: 2867.74 oz

## 2020-05-16 LAB — MRSA PCR SCREENING: MRSA by PCR: NEGATIVE

## 2020-05-16 LAB — PHOSPHORUS: Phosphorus: 5 mg/dL — ABNORMAL HIGH (ref 2.5–4.6)

## 2020-05-16 LAB — MAGNESIUM: Magnesium: 2.1 mg/dL (ref 1.7–2.4)

## 2020-05-16 LAB — LACTIC ACID, PLASMA: Lactic Acid, Venous: 2.4 mmol/L (ref 0.5–1.9)

## 2020-05-16 MED ORDER — ASPIRIN EC 81 MG PO TBEC
81.0000 mg | DELAYED_RELEASE_TABLET | Freq: Every day | ORAL | Status: DC
  Administered 2020-05-16 – 2020-05-22 (×5): 81 mg via ORAL
  Filled 2020-05-16 (×6): qty 1

## 2020-05-16 NOTE — Progress Notes (Signed)
Progress Note  Patient Name: Erin Liu Date of Encounter: 05/16/2020  [facility] Cardiologist: Minus Breeding, MD   Subjective   Sleepy.  Denies chest pain or shortness of breath.  Inpatient Medications    Scheduled Meds: . atorvastatin  40 mg Oral Daily  . heparin  5,000 Units Subcutaneous Q8H  . latanoprost  1 drop Both Eyes QHS   Continuous Infusions: . sodium chloride 75 mL/hr at 05/16/20 0515  . cefTRIAXone (ROCEPHIN)  IV 1 g (05/15/20 2131)  . doxycycline (VIBRAMYCIN) IV Stopped (05/15/20 2338)   PRN Meds: acetaminophen **OR** acetaminophen, promethazine, senna-docusate   Vital Signs    Vitals:   05/16/20 0050 05/16/20 0119 05/16/20 0400 05/16/20 0720  BP: 92/73  100/74 94/71  Pulse: 95   93  Resp: (!) 26 20  19   Temp:   98.2 F (36.8 C) 97.8 F (36.6 C)  TempSrc:   Axillary Axillary  SpO2: 95%   92%  Weight:      Height:        Intake/Output Summary (Last 24 hours) at 05/16/2020 0759 Last data filed at 05/15/2020 1541 Gross per 24 hour  Intake 1382.08 ml  Output --  Net 1382.08 ml   Last 3 Weights 05/15/2020 05/15/2020 04/28/2020  Weight (lbs) 179 lb 3.7 oz 175 lb 174 lb  Weight (kg) 81.3 kg 79.379 kg 78.926 kg      Telemetry    Sinus rhythm- Personally Reviewed  ECG    n/a - Personally Reviewed  Physical Exam   VS:  BP 94/71   Pulse 93   Temp 97.8 F (36.6 C) (Axillary)   Resp 19   Ht 5\' 5"  (1.651 m)   Wt 81.3 kg   SpO2 92%   BMI 29.83 kg/m  , BMI Body mass index is 29.83 kg/m. GENERAL:  Well appearing.  Lying flat in bed in no acute distress. HEENT: Pupils equal round and reactive, fundi not visualized, oral mucosa unremarkable NECK:  No jugular venous distention, waveform within normal limits, carotid upstroke brisk and symmetric, no bruits LUNGS:  Clear to auscultation bilaterally HEART:  RRR.  PMI not displaced or sustained,S1 and S2 within normal limits, no S3, no S4, no clicks, no rubs, III/VI systolic murmurs ABD:   Flat, positive bowel sounds normal in frequency in pitch, no bruits, no rebound, no guarding, no midline pulsatile mass, no hepatomegaly, no splenomegaly EXT:  2 plus pulses throughout, no edema, no cyanosis no clubbing SKIN:  No rashes no nodules NEURO:  Cranial nerves II through XII grossly intact, motor grossly intact throughout [facility]:  Cognitively intact, oriented to person place and time   Labs    High Sensitivity Troponin:   Recent Labs  Lab 05/15/20 0637 05/15/20 0922 05/15/20 1550  TROPONINIHS 7,635* 7,341* 8,905*      Chemistry Recent Labs  Lab 05/14/20 2317 05/14/20 2338 05/15/20 0637 05/16/20 0013  NA 136 146* 137 135  K 5.1 3.1* 5.2* 4.9  CL 103 120* 103 106  CO2 14*  --  20* 18*  GLUCOSE 169* 85 149* 130*  BUN 81* 65* 75* 76*  CREATININE 4.41* 1.80* 4.19* 4.20*  CALCIUM 9.8  --  9.6 9.3  PROT 6.3*  --  6.0* 5.6*  ALBUMIN 3.4*  --  3.0* 3.0*  AST 99*  --  98* 109*  ALT 46*  --  48* 52*  ALKPHOS 102  --  90 97  BILITOT 1.8*  --  1.6* 1.3*  GFRNONAA 10*  --  10* 10*  ANIONGAP 19*  --  14 11     Hematology Recent Labs  Lab 05/14/20 2317 05/14/20 2338 05/15/20 0736 05/16/20 0013  WBC 6.1  --  5.2 5.4  RBC 3.94  --  3.57* 3.59*  HGB 12.6 7.5* 11.3* 11.5*  HCT 40.2 22.0* 35.6* 35.1*  MCV 102.0*  --  99.7 97.8  MCH 32.0  --  31.7 32.0  MCHC 31.3  --  31.7 32.8  RDW 19.1*  --  18.9* 18.9*  PLT 80*  --  66* 77*    BNPNo results for input(s): BNP, PROBNP in the last 168 hours.   DDimer No results for input(s): DDIMER in the last 168 hours.   Radiology    CT Head Wo Contrast  Result Date: 05/15/2020 CLINICAL DATA:  Facial trauma, neck trauma EXAM: CT HEAD WITHOUT CONTRAST CT CERVICAL SPINE WITHOUT CONTRAST TECHNIQUE: Multidetector CT imaging of the head and cervical spine was performed following the standard protocol without intravenous contrast. Multiplanar CT image reconstructions of the cervical spine were also generated. COMPARISON:  CT head  10/02/2017 FINDINGS: CT HEAD FINDINGS Brain: Normal anatomic configuration. Parenchymal volume loss is commensurate with the patient's age. Moderate periventricular white matter changes are present likely reflecting the sequela of small vessel ischemia. Remote lacunar infarcts are noted within the right thalamus, right basal ganglia, left caudate nucleus, and right centrum semiovale. Small infarct within the centrum semiovale appears new since prior examination, though is remote in appearance. No abnormal intra or extra-axial mass lesion or fluid collection. No abnormal mass effect or midline shift. No evidence of acute intracranial hemorrhage or infarct. Ventricular size is normal. Cerebellum unremarkable. Vascular: No asymmetric hyperdense vasculature at the skull base. Skull: Intact Sinuses/Orbits: Paranasal sinuses are clear. Orbits are unremarkable. Other: Mastoid air cells and middle ear cavities are clear. Mild right supraorbital soft tissue swelling. CT CERVICAL SPINE FINDINGS Alignment: Straightening of the cervical spine may be positional in nature. No listhesis. Skull base and vertebrae: No acute fracture. No primary bone lesion or focal pathologic process. Soft tissues and spinal canal: No prevertebral fluid or swelling. No visible canal hematoma. Disc levels: There is intervertebral disc space narrowing and endplate remodeling at Q3-0 in keeping with changes of mild degenerative disc disease. Mild endplate changes are also noted at C4-5 and C5-6 in keeping with minimal degenerative disc disease at these levels. The spinal canal is widely patent. The prevertebral soft tissues are not thickened on sagittal reformats. Review of the axial images demonstrates mild bilateral neuroforaminal narrowing, left greater than right, at C3-4 secondary to uncovertebral arthrosis. Upper chest: Small left pleural effusion. Asymmetric soft tissue swelling is seen involving the visualized left anterior chest wall. Other:  None IMPRESSION: No acute intracranial abnormality. No calvarial fracture. Mild right supraorbital soft tissue swelling. No acute fracture or listhesis of the cervical spine. Asymmetric left anterior chest wall soft tissue swelling, incompletely evaluated on this examination. Small left pleural effusion also identified. Electronically Signed   By: Fidela Salisbury MD   On: 05/15/2020 00:08   CT Cervical Spine Wo Contrast  Result Date: 05/15/2020 CLINICAL DATA:  Facial trauma, neck trauma EXAM: CT HEAD WITHOUT CONTRAST CT CERVICAL SPINE WITHOUT CONTRAST TECHNIQUE: Multidetector CT imaging of the head and cervical spine was performed following the standard protocol without intravenous contrast. Multiplanar CT image reconstructions of the cervical spine were also generated. COMPARISON:  CT head 10/02/2017 FINDINGS: CT HEAD FINDINGS Brain: Normal anatomic configuration.  Parenchymal volume loss is commensurate with the patient's age. Moderate periventricular white matter changes are present likely reflecting the sequela of small vessel ischemia. Remote lacunar infarcts are noted within the right thalamus, right basal ganglia, left caudate nucleus, and right centrum semiovale. Small infarct within the centrum semiovale appears new since prior examination, though is remote in appearance. No abnormal intra or extra-axial mass lesion or fluid collection. No abnormal mass effect or midline shift. No evidence of acute intracranial hemorrhage or infarct. Ventricular size is normal. Cerebellum unremarkable. Vascular: No asymmetric hyperdense vasculature at the skull base. Skull: Intact Sinuses/Orbits: Paranasal sinuses are clear. Orbits are unremarkable. Other: Mastoid air cells and middle ear cavities are clear. Mild right supraorbital soft tissue swelling. CT CERVICAL SPINE FINDINGS Alignment: Straightening of the cervical spine may be positional in nature. No listhesis. Skull base and vertebrae: No acute fracture. No  primary bone lesion or focal pathologic process. Soft tissues and spinal canal: No prevertebral fluid or swelling. No visible canal hematoma. Disc levels: There is intervertebral disc space narrowing and endplate remodeling at Y8-5 in keeping with changes of mild degenerative disc disease. Mild endplate changes are also noted at C4-5 and C5-6 in keeping with minimal degenerative disc disease at these levels. The spinal canal is widely patent. The prevertebral soft tissues are not thickened on sagittal reformats. Review of the axial images demonstrates mild bilateral neuroforaminal narrowing, left greater than right, at C3-4 secondary to uncovertebral arthrosis. Upper chest: Small left pleural effusion. Asymmetric soft tissue swelling is seen involving the visualized left anterior chest wall. Other: None IMPRESSION: No acute intracranial abnormality. No calvarial fracture. Mild right supraorbital soft tissue swelling. No acute fracture or listhesis of the cervical spine. Asymmetric left anterior chest wall soft tissue swelling, incompletely evaluated on this examination. Small left pleural effusion also identified. Electronically Signed   By: Fidela Salisbury MD   On: 05/15/2020 00:08   MR BRAIN WO CONTRAST  Result Date: 05/15/2020 CLINICAL DATA:  80 year old female status post fall. Unexplained altered mental status. EXAM: MRI HEAD WITHOUT CONTRAST TECHNIQUE: Multiplanar, multiecho pulse sequences of the brain and surrounding structures were obtained without intravenous contrast. COMPARISON:  Head CT 2335 hours yesterday.  Head CT 10/02/2017. FINDINGS: Study is intermittently degraded by motion artifact despite repeated imaging attempts. Diagnostic axial T1 weighted imaging could not be obtained. Brain: No restricted diffusion is evident. No midline shift, mass effect, or evidence of intracranial mass lesion. No ventriculomegaly. Normal basilar cisterns. Cervicomedullary junction appears negative. Patchy and  confluent bilateral cerebral white matter T2 and FLAIR hyperintensity. Linear T2 and FLAIR hyperintensity in the right basal ganglia compatible with chronic lacunar infarct mild similar left basal ganglia signal changes. No cortical encephalomalacia or chronic cerebral blood products are evident. Brainstem and cerebellum appear relatively negative for age. Vascular: Major intracranial vascular flow voids are grossly preserved. Skull and upper cervical spine: Visualized bone marrow signal is within normal limits. Cervical spine obscured. Sinuses/Orbits: Postoperative changes to both globes. Paranasal Visualized paranasal sinuses and mastoids are stable and well pneumatized. Other: None. IMPRESSION: 1. Degraded by motion. No acute intracranial abnormality identified. 2. Signal changes compatible with moderate for age chronic small vessel disease. Electronically Signed   By: Genevie Ann M.D.   On: 05/15/2020 06:55   US RENAL  Result Date: 05/15/2020 CLINICAL DATA:  Acute renal failure EXAM: RENAL / URINARY TRACT ULTRASOUND COMPLETE COMPARISON:  Renal ultrasound November 21, 2014 FINDINGS: Right Kidney: Renal measurements: 9.5 x 4.5 x 5.5 cm =  volume: 137 mL. Echogenicity and renal cortical thickness are within normal limits. No mass or perinephric fluid. There is mild fullness of the right renal collecting system. No sonographically demonstrable calculus or ureterectasis. Visualized. Left Kidney: Renal measurements: 8.5 x 4.9 x 5.1 cm = volume: 111 mL. Echogenicity and renal cortical thickness are within normal limits. No mass, perinephric fluid, or hydronephrosis visualized. No sonographically demonstrable calculus or ureterectasis. Bladder: Appears normal for degree of bladder distention. Other: None. IMPRESSION: 1. Kidneys rather small, particularly on the left, a finding that may be a function of age but also may be a function of medical renal disease. Renal echogenicity and cortical thickness normal. 2. There is  mild fullness of the right renal collecting system without obstructing focus evident. No collecting system fullness on the left. No evident renal mass on either side. Electronically Signed   By: Lowella Grip III M.D.   On: 05/15/2020 09:10   DG Chest Portable 1 View  Result Date: 05/14/2020 CLINICAL DATA:  Fall, found unresponsive EXAM: PORTABLE CHEST 1 VIEW COMPARISON:  Radiograph 07/18/2019 FINDINGS: Enlarged, lobular appearance of the cardiac silhouette, can reflect cardiomegaly or pericardial effusion. The aorta is calcified. The remaining cardiomediastinal contours are unremarkable. Some hazy basilar predominant opacities may reflect atelectasis and or early interstitial edema given some vascular congestion with fissural and septal thickening. No pneumothorax. No visible layering effusion. No acute traumatic abnormality of the chest wall. Degenerative changes present in the shoulders and spine. Telemetry leads overlie the chest. IMPRESSION: 1. Enlarged, lobular appearance of the cardiac silhouette, can reflect cardiomegaly or pericardial effusion. 2. Some hazy basilar predominant opacities may reflect atelectasis and/or early interstitial edema given some vascular congestion with fissural and septal thickening. Electronically Signed   By: Lovena Le M.D.   On: 05/14/2020 23:34    Cardiac Studies   Echo pending  Patient Profile     SKYLAH DELAUTER is a 80 y.o. female with a hx of diabetes, hypertension, LBBB, dementia, and CKD IV admitted with pneumonia and fall. Cardiology consulted for elevated troponin. Hospitalization complicated by acute on chronic renal failure and aneuria.  Assessment & Plan    # Elevated troponin:  Troponin is markedly elevated to nearly 9000.  She has a chronic left bundle branch block.  She continues to deny chest pain or pressure. I suspect this is demand ischemia in the setting of sepsis, traumatic fall, and acute on chronic renal failure.  Given her lack of  chest pain we will not start heparin.  Echo is pending.    Start aspirin 81 mg daily.  Discussed conservative management with her daughter.  Continue home atorvastatin and add low-dose beta-blocker as able.  Blood pressure is too low for now.  # Unresponsiveness/Fall:  Unclear if this was syncope.  She is unable to stand for orthostatics.  Echo as above.  Continue on telemetry.   # Acute on chronic renal failure:  # Aneuria: No plans for RRT. Continue IV fluids.  # CAP: Antibiotics per primary team.        For questions or updates, please contact [place] [facility] Please consult [email or web address] for contact info under        Signed, Skeet Latch, MD  05/16/2020, 8:00 AM

## 2020-05-16 NOTE — Progress Notes (Signed)
MEWS RED for respirations counted 24-26 patient does respond to voice [facility] paged of MEWS score Charge RN T. Gecareaeaux also informed of the MEWS score will continue to monitor. Arthor Captain LPN

## 2020-05-16 NOTE — Progress Notes (Signed)
HOSPITAL MEDICINE OVERNIGHT EVENT NOTE    Notified by nursing that patient is exhibiting red MEWS score.  Case discussed with nursing, chart reviewed. Patient is currently being treated for pneumonia with intravenous ceftriaxone and doxycycline.  Patient is currently receiving sodium chloride infusion at 75 cc/h.    While patient is exhibiting low normal blood pressures with systolics in the 15V maps as of late have all been above 65.  Patient has no complaints and does not appear to be in distress.  Continue current treatment plan as is.  Continue to monitor patient closely.   Erin Emerald  MD [facility]

## 2020-05-16 NOTE — Progress Notes (Addendum)
PROGRESS NOTE  Erin Liu UKG:[id number] DOB: [birth year] DOA: 05/14/2020 PCP: Binnie Rail, MD   LOS: 1 day   Brief narrative:  Erin Liu is a 80 y.o. female with medical history significant for history of anemia, diabetes mellitus, dementia, hyperlipidemia, hypertension, renal failure not on dialysis who presented to the EMS after a fall at home and being unresponsive. In the ED, patient was found to be hypothermic and hypotensive.  Patient was given IV fluid hydration and placed on a Retail banker.  CT of the head and neck were obtained and showed no acute pathology.  Patient was found to have elevated creatinine of 4.4 from her baseline of 2.6.  Patient was then admitted hospital for further evaluation and treatment.  During hospitalization, patient was noted to have extremely elevated troponin and cardiology was consulted.  However denied any chest pain.  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  Assessment/Plan:  Principal Problem:   Renal failure (ARF), acute on chronic (HCC) Active Problems:   Hypotension   Hypothermia   CAP (community acquired pneumonia)   Acute metabolic encephalopathy   Sepsis ([place])   Fall at home, initial encounter   Prolonged QT interval   Elevated lactic acid level  Acute kidney injury on chronic kidney disease stage IV.   Creatinine of 4.2 at this time.  Received IV fluids. Hold IVFs.  Renal ultrasound shows small kidneys mostly on the left with increased echogenicity.  Nephrology on board.  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  We will continue to monitor BMP.  Severe sepsis likely secondary to community-acquired pneumonia with acute hypoxic respiratory failure.  Continue Rocephin and doxycycline.    Blood cultures with staph epidermidis.  UA with large protein leukocytes hemoglobin and WBC.  Urine culture without any growth so far. On supplemental oxygen at 2 L/min.  Lactate improved with IV fluids and  antibiotics..  Hypotension   Continue to hold antihypertensives.  Significantly elevated troponin. Could be secondary to sepsis, acute kidney injury, 2D echo has been ordered and is pending Patient denies any chest pain.  Patient does have history of LBBB.  Seen by cardiology for significantly elevated troponin.  Has been started on aspirin Lipitor.  Could benefit from low-dose beta-blockers if blood pressure is better.  Communicated with cardiology.  Severe systolic heart failure. LV ejection fraction significantly low with LBBB.  Patient with decreased urinary output.  Spoke with the patient's daughter about this finding.  Cardiology will be talking to the family about further management plan  Hypothermia On presentation today, required Bair hugger.  Latest temperature of 76.2 F  Acute metabolic encephalopathy Likely secondary to acute kidney injury and sepsis.  With baseline dementia.  There was some concern for slurred speech as well.  CT of the head did not show any hemorrhage or acute stroke.   MRI of the brain was degraded study but most compatible with chronic small vessel disease.  Fall at home, initial encounter Continue precautions.    Will get PT evaluation as well  Prolonged QT interval Correct electrolytes.  Avoid QT prolonging agents.  DVT prophylaxis: heparin injection 5,000 Units Start: 05/15/20 0600    Code Status: Full code.  Family Communication: Spoke with the patient's daughter on the phone and updated her about the findings on the echocardiogram and potential poor prognosis in the context of severe systolic dysfunction renal failure.  We will get palliative care consultation as  well.  Status is: Inpatient  Remains inpatient appropriate because:IV treatments appropriate due to intensity of illness or inability to take PO and Inpatient level of care appropriate due to severity of illness   Dispo: The patient is from: Home               Anticipated d/c is to: Home              Patient currently is not medically stable to d/c.   Difficult to place patient No  Consultants:  Cardiology  Palliative care  Procedures:  None  Anti-infectives:  Marland Kitchen Rocephin and doxycycline  Anti-infectives (From admission, onward)   Start     Dose/Rate Route Frequency Ordered Stop   05/15/20 2200  cefTRIAXone (ROCEPHIN) 1 g in sodium chloride 0.9 % 100 mL IVPB        1 g 200 mL/hr over 30 Minutes Intravenous Every 24 hours 05/15/20 0506     05/15/20 0615  doxycycline (VIBRAMYCIN) 100 mg in sodium chloride 0.9 % 250 mL IVPB        100 mg 125 mL/hr over 120 Minutes Intravenous 2 times daily 05/15/20 0506     05/15/20 0045  cefTRIAXone (ROCEPHIN) 1 g in sodium chloride 0.9 % 100 mL IVPB        1 g 200 mL/hr over 30 Minutes Intravenous  Once 05/15/20 0031 05/15/20 0136   05/15/20 0045  azithromycin (ZITHROMAX) 500 mg in sodium chloride 0.9 % 250 mL IVPB        500 mg 250 mL/hr over 60 Minutes Intravenous  Once 05/15/20 0031 05/15/20 0237     Subjective: Today, patient was seen and examined at bedside.  Patient denies any chest pain, nausea, vomiting, fever chills  Objective: Vitals:   05/16/20 0400 05/16/20 0720  BP: 100/74 94/71  Pulse:  93  Resp:  19  Temp: 98.2 F (36.8 C) 97.8 F (36.6 C)  SpO2:  92%    Intake/Output Summary (Last 24 hours) at 05/16/2020 0938 Last data filed at 05/15/2020 1541 Gross per 24 hour  Intake 1132.08 ml  Output --  Net 1132.08 ml   Filed Weights   05/15/20 1227 05/15/20 2210  Weight: 79.4 kg 81.3 kg   Body mass index is 29.83 kg/m.   Physical Exam: GENERAL: Patient is alert awake and oriented. Not in obvious distress.  Obese built, nasal cannula oxygen HENT: No scleral pallor or icterus. Pupils equally reactive to light. Oral mucosa is moist NECK: is supple, no gross swelling noted. CHEST: Clear to auscultation. No crackles or wheezes.  Diminished breath sounds bilaterally. CVS: S1  and S2 heard, no murmur. Regular rate and rhythm.  ABDOMEN: Soft, non-tender, bowel sounds are present. EXTREMITIES: No lower extremity edema noted CNS: Cranial nerves are intact. No focal motor deficits. SKIN: warm and dry without rashes.  Data Review: I have personally reviewed the following laboratory data and studies,  CBC: Recent Labs  Lab 05/14/20 2317 05/14/20 2338 05/15/20 0736 05/16/20 0013  WBC 6.1  --  5.2 5.4  NEUTROABS 2.7  --   --   --   HGB 12.6 7.5* 11.3* 11.5*  HCT 40.2 22.0* 35.6* 35.1*  MCV 102.0*  --  99.7 97.8  PLT 80*  --  66* 77*   Basic Metabolic Panel: Recent Labs  Lab 05/14/20 2317 05/14/20 2338 05/15/20 0637 05/16/20 0013  NA 136 146* 137 135  K 5.1 3.1* 5.2* 4.9  CL 103 120* 103 106  CO2  14*  --  20* 18*  GLUCOSE 169* 85 149* 130*  BUN 81* 65* 75* 76*  CREATININE 4.41* 1.80* 4.19* 4.20*  CALCIUM 9.8  --  9.6 9.3  MG  --   --   --  2.1  PHOS  --   --   --  5.0*   Liver Function Tests: Recent Labs  Lab 05/14/20 2317 05/15/20 0637 05/16/20 0013  AST 99* 98* 109*  ALT 46* 48* 52*  ALKPHOS 102 90 97  BILITOT 1.8* 1.6* 1.3*  PROT 6.3* 6.0* 5.6*  ALBUMIN 3.4* 3.0* 3.0*   No results for input(s): LIPASE, AMYLASE in the last 168 hours. No results for input(s): AMMONIA in the last 168 hours. Cardiac Enzymes: Recent Labs  Lab 05/14/20 2331  CKTOTAL 241*   BNP (last 3 results) No results for input(s): BNP in the last 8760 hours.  ProBNP (last 3 results) No results for input(s): PROBNP in the last 8760 hours.  CBG: Recent Labs  Lab 05/14/20 2317  GLUCAP 134*   Recent Results (from the past 240 hour(s))  Culture, blood (Routine x 2)     Status: None (Preliminary result)   Collection Time: 05/14/20 11:27 PM   Specimen: BLOOD  Result Value Ref Range Status   Specimen Description BLOOD LEFT ANTECUBITAL  Final   Special Requests   Final    BOTTLES DRAWN AEROBIC AND ANAEROBIC Blood Culture adequate volume   Culture  Setup Time    Final    GRAM POSITIVE COCCI IN CLUSTERS AEROBIC BOTTLE ONLY CRITICAL RESULT CALLED TO, READ BACK BY AND VERIFIED WITH: T,JACKY PHARMD @0902  05/16/20 EB Performed at [facility], [place] [facility] [address] [place], [place] [address]    Culture GRAM POSITIVE COCCI IN CLUSTERS  Final   Report Status PENDING  Incomplete  Blood Culture ID Panel (Reflexed)     Status: Abnormal   Collection Time: 05/14/20 11:27 PM  Result Value Ref Range Status   Enterococcus faecalis NOT DETECTED NOT DETECTED Final   Enterococcus Faecium NOT DETECTED NOT DETECTED Final   Listeria monocytogenes NOT DETECTED NOT DETECTED Final   Staphylococcus species DETECTED (A) NOT DETECTED Final    Comment: CRITICAL RESULT CALLED TO, READ BACK BY AND VERIFIED WITH: T,JACKY PHARMD @0902  05/16/20 EB    Staphylococcus aureus (BCID) NOT DETECTED NOT DETECTED Final   Staphylococcus epidermidis DETECTED (A) NOT DETECTED Final    Comment: Methicillin (oxacillin) resistant coagulase negative staphylococcus. Possible blood culture contaminant (unless isolated from more than one blood culture draw or clinical case suggests pathogenicity). No antibiotic treatment is indicated for blood  culture contaminants. CRITICAL RESULT CALLED TO, READ BACK BY AND VERIFIED WITH: T,JACKY PHARMD @0902  05/16/20 EB    Staphylococcus lugdunensis NOT DETECTED NOT DETECTED Final   Streptococcus species NOT DETECTED NOT DETECTED Final   Streptococcus agalactiae NOT DETECTED NOT DETECTED Final   Streptococcus pneumoniae NOT DETECTED NOT DETECTED Final   Streptococcus pyogenes NOT DETECTED NOT DETECTED Final   A.calcoaceticus-baumannii NOT DETECTED NOT DETECTED Final   Bacteroides fragilis NOT DETECTED NOT DETECTED Final   Enterobacterales NOT DETECTED NOT DETECTED Final   Enterobacter cloacae complex NOT DETECTED NOT DETECTED Final   Escherichia coli NOT DETECTED NOT DETECTED Final   Klebsiella aerogenes NOT DETECTED NOT DETECTED Final   Klebsiella  oxytoca NOT DETECTED NOT DETECTED Final   Klebsiella pneumoniae NOT DETECTED NOT DETECTED Final   Proteus species NOT DETECTED NOT DETECTED Final   Salmonella species NOT DETECTED NOT DETECTED  Final   Serratia marcescens NOT DETECTED NOT DETECTED Final   Haemophilus influenzae NOT DETECTED NOT DETECTED Final   Neisseria meningitidis NOT DETECTED NOT DETECTED Final   Pseudomonas aeruginosa NOT DETECTED NOT DETECTED Final   Stenotrophomonas maltophilia NOT DETECTED NOT DETECTED Final   Candida albicans NOT DETECTED NOT DETECTED Final   Candida auris NOT DETECTED NOT DETECTED Final   Candida glabrata NOT DETECTED NOT DETECTED Final   Candida krusei NOT DETECTED NOT DETECTED Final   Candida parapsilosis NOT DETECTED NOT DETECTED Final   Candida tropicalis NOT DETECTED NOT DETECTED Final   Cryptococcus neoformans/gattii NOT DETECTED NOT DETECTED Final   Methicillin resistance mecA/C DETECTED (A) NOT DETECTED Final    Comment: CRITICAL RESULT CALLED TO, READ BACK BY AND VERIFIED WITH: T,JACKY PHARMD @0902  05/16/20 EB Performed at [facility], [address] [place], [place] [address]   MRSA PCR Screening     Status: None   Collection Time: 05/15/20  2:28 AM   Specimen: Nasopharyngeal  Result Value Ref Range Status   MRSA by PCR NEGATIVE NEGATIVE Final    Comment:        The GeneXpert MRSA Assay (FDA approved for NASAL specimens only), is one component of a comprehensive MRSA colonization surveillance program. It is not intended to diagnose MRSA infection nor to guide or monitor treatment for MRSA infections. Performed at [facility], [place] [address], [place], [place] [address]   Culture, blood (Routine x 2)     Status: None (Preliminary result)   Collection Time: 05/15/20  2:34 AM   Specimen: BLOOD RIGHT HAND  Result Value Ref Range Status   Specimen Description BLOOD RIGHT HAND  Final   Special Requests   Final    BOTTLES DRAWN AEROBIC AND ANAEROBIC Blood Culture  results may not be optimal due to an inadequate volume of blood received in culture bottles   Culture   Final    NO GROWTH < 12 HOURS Performed at [facility], [place] [address], [place], [place] [address]    Report Status PENDING  Incomplete  Resp Panel by RT-PCR (Flu A&B, Covid) Nasopharyngeal Swab     Status: None   Collection Time: 05/15/20  4:24 AM   Specimen: Nasopharyngeal Swab; Nasopharyngeal(NP) swabs in vial transport medium  Result Value Ref Range Status   SARS Coronavirus 2 by RT PCR NEGATIVE NEGATIVE Final    Comment: (NOTE) SARS-CoV-2 target nucleic acids are NOT DETECTED.  The SARS-CoV-2 RNA is generally detectable in upper respiratory specimens during the acute phase of infection. The lowest concentration of SARS-CoV-2 viral copies this assay can detect is 138 copies/mL. A negative result does not preclude SARS-Cov-2 infection and should not be used as the sole basis for treatment or other patient management decisions. A negative result may occur with  improper specimen collection/handling, submission of specimen other than nasopharyngeal swab, presence of viral mutation(s) within the areas targeted by this assay, and inadequate number of viral copies(<138 copies/mL). A negative result must be combined with clinical observations, patient history, and epidemiological information. The expected result is Negative.  Fact Sheet for Patients:  EntrepreneurPulse.com.au  Fact Sheet for Healthcare Providers:  IncredibleEmployment.be  This test is no t yet approved or cleared by the [place] FDA and  has been authorized for detection and/or diagnosis of SARS-CoV-2 by FDA under an Emergency Use Authorization (EUA). This EUA will remain  in effect (meaning this test can be used) for the duration  of the COVID-19 declaration under Section 564(b)(1) of the Act, 21 U.S.C.section 360bbb-3(b)(1), unless the authorization is terminated   or revoked sooner.       Influenza A by PCR NEGATIVE NEGATIVE Final   Influenza B by PCR NEGATIVE NEGATIVE Final    Comment: (NOTE) The Xpert Xpress SARS-CoV-2/FLU/RSV plus assay is intended as an aid in the diagnosis of influenza from Nasopharyngeal swab specimens and should not be used as a sole basis for treatment. Nasal washings and aspirates are unacceptable for Xpert Xpress SARS-CoV-2/FLU/RSV testing.  Fact Sheet for Patients: EntrepreneurPulse.com.au  Fact Sheet for Healthcare Providers: IncredibleEmployment.be  This test is not yet approved or cleared by the [place] FDA and has been authorized for detection and/or diagnosis of SARS-CoV-2 by FDA under an Emergency Use Authorization (EUA). This EUA will remain in effect (meaning this test can be used) for the duration of the COVID-19 declaration under Section 564(b)(1) of the Act, 21 U.S.C. section 360bbb-3(b)(1), unless the authorization is terminated or revoked.  Performed at [facility], [place] [address] [place], [place] [address]      Studies: CT Head Wo Contrast  Result Date: 05/15/2020 CLINICAL DATA:  Facial trauma, neck trauma EXAM: CT HEAD WITHOUT CONTRAST CT CERVICAL SPINE WITHOUT CONTRAST TECHNIQUE: Multidetector CT imaging of the head and cervical spine was performed following the standard protocol without intravenous contrast. Multiplanar CT image reconstructions of the cervical spine were also generated. COMPARISON:  CT head 10/02/2017 FINDINGS: CT HEAD FINDINGS Brain: Normal anatomic configuration. Parenchymal volume loss is commensurate with the patient's age. Moderate periventricular white matter changes are present likely reflecting the sequela of small vessel ischemia. Remote lacunar infarcts are noted within the right thalamus, right basal ganglia, left caudate nucleus, and right centrum semiovale. Small infarct within the centrum semiovale appears new since  prior examination, though is remote in appearance. No abnormal intra or extra-axial mass lesion or fluid collection. No abnormal mass effect or midline shift. No evidence of acute intracranial hemorrhage or infarct. Ventricular size is normal. Cerebellum unremarkable. Vascular: No asymmetric hyperdense vasculature at the skull base. Skull: Intact Sinuses/Orbits: Paranasal sinuses are clear. Orbits are unremarkable. Other: Mastoid air cells and middle ear cavities are clear. Mild right supraorbital soft tissue swelling. CT CERVICAL SPINE FINDINGS Alignment: Straightening of the cervical spine may be positional in nature. No listhesis. Skull base and vertebrae: No acute fracture. No primary bone lesion or focal pathologic process. Soft tissues and spinal canal: No prevertebral fluid or swelling. No visible canal hematoma. Disc levels: There is intervertebral disc space narrowing and endplate remodeling at H8-5 in keeping with changes of mild degenerative disc disease. Mild endplate changes are also noted at C4-5 and C5-6 in keeping with minimal degenerative disc disease at these levels. The spinal canal is widely patent. The prevertebral soft tissues are not thickened on sagittal reformats. Review of the axial images demonstrates mild bilateral neuroforaminal narrowing, left greater than right, at C3-4 secondary to uncovertebral arthrosis. Upper chest: Small left pleural effusion. Asymmetric soft tissue swelling is seen involving the visualized left anterior chest wall. Other: None IMPRESSION: No acute intracranial abnormality. No calvarial fracture. Mild right supraorbital soft tissue swelling. No acute fracture or listhesis of the cervical spine. Asymmetric left anterior chest wall soft tissue swelling, incompletely evaluated on this examination. Small left pleural effusion also identified. Electronically Signed   By: Fidela Salisbury MD   On: 05/15/2020 00:08   CT Cervical Spine Wo Contrast  Result Date:  05/15/2020  CLINICAL DATA:  Facial trauma, neck trauma EXAM: CT HEAD WITHOUT CONTRAST CT CERVICAL SPINE WITHOUT CONTRAST TECHNIQUE: Multidetector CT imaging of the head and cervical spine was performed following the standard protocol without intravenous contrast. Multiplanar CT image reconstructions of the cervical spine were also generated. COMPARISON:  CT head 10/02/2017 FINDINGS: CT HEAD FINDINGS Brain: Normal anatomic configuration. Parenchymal volume loss is commensurate with the patient's age. Moderate periventricular white matter changes are present likely reflecting the sequela of small vessel ischemia. Remote lacunar infarcts are noted within the right thalamus, right basal ganglia, left caudate nucleus, and right centrum semiovale. Small infarct within the centrum semiovale appears new since prior examination, though is remote in appearance. No abnormal intra or extra-axial mass lesion or fluid collection. No abnormal mass effect or midline shift. No evidence of acute intracranial hemorrhage or infarct. Ventricular size is normal. Cerebellum unremarkable. Vascular: No asymmetric hyperdense vasculature at the skull base. Skull: Intact Sinuses/Orbits: Paranasal sinuses are clear. Orbits are unremarkable. Other: Mastoid air cells and middle ear cavities are clear. Mild right supraorbital soft tissue swelling. CT CERVICAL SPINE FINDINGS Alignment: Straightening of the cervical spine may be positional in nature. No listhesis. Skull base and vertebrae: No acute fracture. No primary bone lesion or focal pathologic process. Soft tissues and spinal canal: No prevertebral fluid or swelling. No visible canal hematoma. Disc levels: There is intervertebral disc space narrowing and endplate remodeling at H7-0 in keeping with changes of mild degenerative disc disease. Mild endplate changes are also noted at C4-5 and C5-6 in keeping with minimal degenerative disc disease at these levels. The spinal canal is widely patent.  The prevertebral soft tissues are not thickened on sagittal reformats. Review of the axial images demonstrates mild bilateral neuroforaminal narrowing, left greater than right, at C3-4 secondary to uncovertebral arthrosis. Upper chest: Small left pleural effusion. Asymmetric soft tissue swelling is seen involving the visualized left anterior chest wall. Other: None IMPRESSION: No acute intracranial abnormality. No calvarial fracture. Mild right supraorbital soft tissue swelling. No acute fracture or listhesis of the cervical spine. Asymmetric left anterior chest wall soft tissue swelling, incompletely evaluated on this examination. Small left pleural effusion also identified. Electronically Signed   By: Fidela Salisbury MD   On: 05/15/2020 00:08   MR BRAIN WO CONTRAST  Result Date: 05/15/2020 CLINICAL DATA:  80 year old female status post fall. Unexplained altered mental status. EXAM: MRI HEAD WITHOUT CONTRAST TECHNIQUE: Multiplanar, multiecho pulse sequences of the brain and surrounding structures were obtained without intravenous contrast. COMPARISON:  Head CT 2335 hours yesterday.  Head CT 10/02/2017. FINDINGS: Study is intermittently degraded by motion artifact despite repeated imaging attempts. Diagnostic axial T1 weighted imaging could not be obtained. Brain: No restricted diffusion is evident. No midline shift, mass effect, or evidence of intracranial mass lesion. No ventriculomegaly. Normal basilar cisterns. Cervicomedullary junction appears negative. Patchy and confluent bilateral cerebral white matter T2 and FLAIR hyperintensity. Linear T2 and FLAIR hyperintensity in the right basal ganglia compatible with chronic lacunar infarct mild similar left basal ganglia signal changes. No cortical encephalomalacia or chronic cerebral blood products are evident. Brainstem and cerebellum appear relatively negative for age. Vascular: Major intracranial vascular flow voids are grossly preserved. Skull and upper  cervical spine: Visualized bone marrow signal is within normal limits. Cervical spine obscured. Sinuses/Orbits: Postoperative changes to both globes. Paranasal Visualized paranasal sinuses and mastoids are stable and well pneumatized. Other: None. IMPRESSION: 1. Degraded by motion. No acute intracranial abnormality identified. 2. Signal changes compatible with moderate  for age chronic small vessel disease. Electronically Signed   By: Genevie Ann M.D.   On: 05/15/2020 06:55   US RENAL  Result Date: 05/15/2020 CLINICAL DATA:  Acute renal failure EXAM: RENAL / URINARY TRACT ULTRASOUND COMPLETE COMPARISON:  Renal ultrasound November 21, 2014 FINDINGS: Right Kidney: Renal measurements: 9.5 x 4.5 x 5.5 cm = volume: 137 mL. Echogenicity and renal cortical thickness are within normal limits. No mass or perinephric fluid. There is mild fullness of the right renal collecting system. No sonographically demonstrable calculus or ureterectasis. Visualized. Left Kidney: Renal measurements: 8.5 x 4.9 x 5.1 cm = volume: 111 mL. Echogenicity and renal cortical thickness are within normal limits. No mass, perinephric fluid, or hydronephrosis visualized. No sonographically demonstrable calculus or ureterectasis. Bladder: Appears normal for degree of bladder distention. Other: None. IMPRESSION: 1. Kidneys rather small, particularly on the left, a finding that may be a function of age but also may be a function of medical renal disease. Renal echogenicity and cortical thickness normal. 2. There is mild fullness of the right renal collecting system without obstructing focus evident. No collecting system fullness on the left. No evident renal mass on either side. Electronically Signed   By: Lowella Grip III M.D.   On: 05/15/2020 09:10   DG Chest Portable 1 View  Result Date: 05/14/2020 CLINICAL DATA:  Fall, found unresponsive EXAM: PORTABLE CHEST 1 VIEW COMPARISON:  Radiograph 07/18/2019 FINDINGS: Enlarged, lobular appearance of  the cardiac silhouette, can reflect cardiomegaly or pericardial effusion. The aorta is calcified. The remaining cardiomediastinal contours are unremarkable. Some hazy basilar predominant opacities may reflect atelectasis and or early interstitial edema given some vascular congestion with fissural and septal thickening. No pneumothorax. No visible layering effusion. No acute traumatic abnormality of the chest wall. Degenerative changes present in the shoulders and spine. Telemetry leads overlie the chest. IMPRESSION: 1. Enlarged, lobular appearance of the cardiac silhouette, can reflect cardiomegaly or pericardial effusion. 2. Some hazy basilar predominant opacities may reflect atelectasis and/or early interstitial edema given some vascular congestion with fissural and septal thickening. Electronically Signed   By: Lovena Le M.D.   On: 05/14/2020 23:34      [place] Lipps, MD  [facility] 05/16/2020  If 7PM-7AM, please contact night-coverage

## 2020-05-16 NOTE — Progress Notes (Signed)
PHARMACY - PHYSICIAN COMMUNICATION CRITICAL VALUE ALERT - BLOOD CULTURE IDENTIFICATION (BCID)  Erin Liu is an 80 y.o. female who presented to [facility] on 05/14/2020 after fall at home and found unresponsive.  Assessment: Blood cultures showing Staph epi in 1/4 bottles. Likely contaminate. Patient currently being treated for CAP.  Name of physician (or Provider) Contacted: Flora Lipps, MD  Current antibiotics: Ceftriaxone 1g IV q24h, Doxycycline 100 mg IV BID.  Changes to prescribed antibiotics recommended:  No changes recommended at this time.   Results for orders placed or performed during the hospital encounter of 05/14/20  Blood Culture ID Panel (Reflexed) (Collected: 05/14/2020 11:27 PM)  Result Value Ref Range   Enterococcus faecalis NOT DETECTED NOT DETECTED   Enterococcus Faecium NOT DETECTED NOT DETECTED   Listeria monocytogenes NOT DETECTED NOT DETECTED   Staphylococcus species DETECTED (A) NOT DETECTED   Staphylococcus aureus (BCID) NOT DETECTED NOT DETECTED   Staphylococcus epidermidis DETECTED (A) NOT DETECTED   Staphylococcus lugdunensis NOT DETECTED NOT DETECTED   Streptococcus species NOT DETECTED NOT DETECTED   Streptococcus agalactiae NOT DETECTED NOT DETECTED   Streptococcus pneumoniae NOT DETECTED NOT DETECTED   Streptococcus pyogenes NOT DETECTED NOT DETECTED   A.calcoaceticus-baumannii NOT DETECTED NOT DETECTED   Bacteroides fragilis NOT DETECTED NOT DETECTED   Enterobacterales NOT DETECTED NOT DETECTED   Enterobacter cloacae complex NOT DETECTED NOT DETECTED   Escherichia coli NOT DETECTED NOT DETECTED   Klebsiella aerogenes NOT DETECTED NOT DETECTED   Klebsiella oxytoca NOT DETECTED NOT DETECTED   Klebsiella pneumoniae NOT DETECTED NOT DETECTED   Proteus species NOT DETECTED NOT DETECTED   Salmonella species NOT DETECTED NOT DETECTED   Serratia marcescens NOT DETECTED NOT DETECTED   Haemophilus influenzae NOT DETECTED NOT DETECTED   Neisseria  meningitidis NOT DETECTED NOT DETECTED   Pseudomonas aeruginosa NOT DETECTED NOT DETECTED   Stenotrophomonas maltophilia NOT DETECTED NOT DETECTED   Candida albicans NOT DETECTED NOT DETECTED   Candida auris NOT DETECTED NOT DETECTED   Candida glabrata NOT DETECTED NOT DETECTED   Candida krusei NOT DETECTED NOT DETECTED   Candida parapsilosis NOT DETECTED NOT DETECTED   Candida tropicalis NOT DETECTED NOT DETECTED   Cryptococcus neoformans/gattii NOT DETECTED NOT DETECTED   Methicillin resistance mecA/C DETECTED (A) NOT DETECTED    Claudina Lick, PharmD PGY1 Acute Care Pharmacy Resident 05/16/2020 9:04 AM  Please check [email or web address] for unit-specific pharmacy phone numbers.

## 2020-05-16 NOTE — Progress Notes (Signed)
  Echocardiogram 2D Echocardiogram has been performed.  Jennette Dubin 05/16/2020, 9:58 AM

## 2020-05-16 NOTE — Progress Notes (Signed)
Updated [facility] that no void since 1900 bladder scanned for 148 mls and last known BUN 4.20 Creatinine 76  This communication via chat. Will continue to monitor. Arthor Captain LPN

## 2020-05-16 NOTE — Progress Notes (Signed)
Hospitalist and Cardiology notifies about ST elevations, increasing heart rate, vomiting, lack of urine output. EKG obtained, otherwise no concern about a STEMI.

## 2020-05-16 NOTE — Progress Notes (Addendum)
Nephrology Follow-Up Consult note   Assessment/Recommendations: Erin Liu is a/an 80 y.o. 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  No significant obstruction on renal ultrasound -Continue with IV hydration for now, appears euvolemic/slightly dry -Per outpatient records no plans for dialysis given the patient's comorbidities -Continue supportive care for shock as below -Continue antibiotics  Sepsis/UTI/PNA: With shock now improving after fluids.  Continue hydration as above and antibiotics per primary team.  Possible UTI and or PNA.  Blood culture with staph epidermidis; contaminant?  Acute hypoxic respiratory failure: Possibly related to pneumonia.  Management per primary team  Acute encephalopathy: Likely toxic metabolic related to sepsis and electrolyte abnormalities with underlying dementia.  Continue supportive measures  Anemia: Mild with hemoglobin 11.5.  Continue to monitor  Thrombocytopenia: Slightly improved today.  Have been decreasing in the outpatient setting.  Work-up and management per primary team  Elevated troponin: Management per primary team and cardiology.  Conservative management chosen at this time   Recommendations conveyed to primary service.    [facility] 05/16/2020 10:06 AM  ___________________________________________________________  CC: AKI on CKD  Interval History/Subjective: Patient is confused and unable to provide significant history.  Creatinine essentially stable today.  Urine output not documented.  No other significant changes   Medications:  Current Facility-Administered Medications  Medication Dose Route Frequency Provider Last Rate Last Admin  . 0.9 %  sodium chloride infusion   Intravenous Continuous Claudia Desanctis, MD 75 mL/hr at 05/16/20 0515 New Bag at  05/16/20 0515  . acetaminophen (TYLENOL) tablet 650 mg  650 mg Oral Q6H PRN Chotiner, Yevonne Aline, MD       Or  . acetaminophen (TYLENOL) suppository 650 mg  650 mg Rectal Q6H PRN Chotiner, Yevonne Aline, MD      . aspirin EC tablet 81 mg  81 mg Oral Daily Skeet Latch, MD      . atorvastatin (LIPITOR) tablet 40 mg  40 mg Oral Daily Chotiner, Yevonne Aline, MD   40 mg at 05/15/20 0914  . cefTRIAXone (ROCEPHIN) 1 g in sodium chloride 0.9 % 100 mL IVPB  1 g Intravenous Q24H Chotiner, Yevonne Aline, MD 200 mL/hr at 05/15/20 2131 1 g at 05/15/20 2131  . doxycycline (VIBRAMYCIN) 100 mg in sodium chloride 0.9 % 250 mL IVPB  100 mg Intravenous BID Chotiner, Yevonne Aline, MD   Stopped at 05/15/20 2338  . heparin injection 5,000 Units  5,000 Units Subcutaneous Q8H Chotiner, Yevonne Aline, MD   5,000 Units at 05/16/20 0517  . latanoprost (XALATAN) 0.005 % ophthalmic solution 1 drop  1 drop Both Eyes QHS Chotiner, Yevonne Aline, MD      . promethazine (PHENERGAN) injection 12.5 mg  12.5 mg Intravenous Q6H PRN Chotiner, Yevonne Aline, MD      . senna-docusate (Senokot-S) tablet 1 tablet  1 tablet Oral QHS PRN Chotiner, Yevonne Aline, MD          Review of Systems: 10 systems reviewed and negative except per interval history/subjective  Physical Exam: Vitals:   05/16/20 0400 05/16/20 0720  BP: 100/74 94/71  Pulse:  93  Resp:  19  Temp: 98.2 F (36.8 C) 97.8 F (36.6 C)  SpO2:  92%   No intake/output data recorded.  Intake/Output Summary (Last 24 hours) at 05/16/2020 1006 Last data filed at  05/15/2020 1541 Gross per 24 hour  Intake 1132.08 ml  Output -  Net 1132.08 ml   Constitutional: Chronically ill-appearing, lying in bed, no distress ENMT: ears and nose without scars or lesions, MMM CV: normal rate, no edema Respiratory: Bilateral chest rise with no increased work of breathing, lying flat Gastrointestinal: soft, non-tender, no palpable masses or hernias Skin: no visible lesions or rashes Psych: Awake, alert,  difficulty answering questions   Test Results I personally reviewed new and old clinical labs and radiology tests Lab Results  Component Value Date   NA 135 05/16/2020   K 4.9 05/16/2020   CL 106 05/16/2020   CO2 18 (L) 05/16/2020   BUN 76 (H) 05/16/2020   CREATININE 4.20 (H) 05/16/2020   GFR 16.75 (L) 03/03/2020   CALCIUM 9.3 05/16/2020   ALBUMIN 3.0 (L) 05/16/2020   PHOS 5.0 (H) 05/16/2020

## 2020-05-17 ENCOUNTER — Inpatient Hospital Stay: Payer: Self-pay

## 2020-05-17 DIAGNOSIS — R570 Cardiogenic shock: Secondary | ICD-10-CM

## 2020-05-17 DIAGNOSIS — I214 Non-ST elevation (NSTEMI) myocardial infarction: Secondary | ICD-10-CM

## 2020-05-17 LAB — BASIC METABOLIC PANEL
Anion gap: 10 (ref 5–15)
BUN: 75 mg/dL — ABNORMAL HIGH (ref 8–23)
CO2: 18 mmol/L — ABNORMAL LOW (ref 22–32)
Calcium: 9 mg/dL (ref 8.9–10.3)
Chloride: 108 mmol/L (ref 98–111)
Creatinine, Ser: 4.26 mg/dL — ABNORMAL HIGH (ref 0.44–1.00)
GFR, Estimated: 10 mL/min — ABNORMAL LOW (ref >60)
Glucose, Bld: 130 mg/dL — ABNORMAL HIGH (ref 70–99)
Potassium: 4.4 mmol/L (ref 3.5–5.1)
Sodium: 136 mmol/L (ref 135–145)

## 2020-05-17 LAB — CBC
HCT: 32.8 % — ABNORMAL LOW (ref 36.0–46.0)
Hemoglobin: 11.2 g/dL — ABNORMAL LOW (ref 12.0–15.0)
MCH: 32.7 pg (ref 26.0–34.0)
MCHC: 34.1 g/dL (ref 30.0–36.0)
MCV: 95.9 fL (ref 80.0–100.0)
Platelets: 66 10*3/uL — ABNORMAL LOW (ref 150–400)
RBC: 3.42 MIL/uL — ABNORMAL LOW (ref 3.87–5.11)
RDW: 19.5 % — ABNORMAL HIGH (ref 11.5–15.5)
WBC: 5.8 10*3/uL (ref 4.0–10.5)
nRBC: 5.1 % — ABNORMAL HIGH (ref 0.0–0.2)

## 2020-05-17 LAB — MAGNESIUM: Magnesium: 1.9 mg/dL (ref 1.7–2.4)

## 2020-05-17 LAB — PHOSPHORUS: Phosphorus: 4.5 mg/dL (ref 2.5–4.6)

## 2020-05-17 LAB — COOXEMETRY PANEL
Carboxyhemoglobin: 0.6 % (ref 0.5–1.5)
Methemoglobin: 0.9 % (ref 0.0–1.5)
O2 Saturation: 37.8 %
Total hemoglobin: 12.4 g/dL (ref 12.0–16.0)

## 2020-05-17 MED ORDER — SODIUM CHLORIDE 0.9% FLUSH: 10.0000 mL | INTRAVENOUS | Status: DC | PRN

## 2020-05-17 MED ORDER — LACTATED RINGERS IV SOLN: INTRAVENOUS | Status: DC

## 2020-05-17 MED ORDER — SODIUM CHLORIDE 0.9% FLUSH
10.0000 mL | Freq: Two times a day (BID) | INTRAVENOUS | Status: DC
  Administered 2020-05-17 – 2020-05-21 (×7): 10 mL

## 2020-05-17 MED ORDER — MILRINONE LACTATE IN DEXTROSE 20-5 MG/100ML-% IV SOLN
0.1250 ug/kg/min | INTRAVENOUS | Status: DC
  Administered 2020-05-17 – 2020-05-19 (×4): 0.25 ug/kg/min via INTRAVENOUS
  Administered 2020-05-20: 0.125 ug/kg/min via INTRAVENOUS
  Filled 2020-05-17 (×5): qty 100

## 2020-05-17 MED ORDER — CHLORHEXIDINE GLUCONATE CLOTH 2 % EX PADS
6.0000 | MEDICATED_PAD | Freq: Every day | CUTANEOUS | Status: DC
  Administered 2020-05-17 – 2020-05-20 (×4): 6 via TOPICAL

## 2020-05-17 NOTE — Progress Notes (Signed)
PM progress note  Pt more lethargic, but still participative given cues and significant assist.  Emphasis on scooting, sit to stand and transfers.    05/17/20 1500  PT Visit Information  Last PT Received On 05/17/20  Assistance Needed +2  History of Present Illness pt is an 80 y/o female presenting 3/9 after a fall at home and being unresponsive.  Work up found acute on chronic renal failure, CAP with sepsis and acute metabolic encephalopathy.  Precautions  Precautions Fall  Pain Assessment  Pain Assessment Faces  Faces Pain Scale 0  Pain Intervention(s) Monitored during session  Cognition  Arousal/Alertness Lethargic  General Comments More lethargic and a little more confused after talking with family andsleeping in the chair for a few hours  Bed Mobility  Overal bed mobility Needs Assistance  Bed Mobility Sit to Supine  Sit to supine Max assist;+2 for physical assistance  General bed mobility comments needed both truncal and LE assist  Transfers  Overall transfer level Needs assistance  Transfers Sit to/from Stand;Stand Pivot Transfers  Sit to Stand Max assist  Stand pivot transfers Max assist;+2 physical assistance  General transfer comment needed more cuing and direction this pm due to more lethargic.  Balance  Sitting balance-Leahy Scale Poor  Standing balance-Leahy Scale Poor  General Comments  General comments (skin integrity, edema, etc.) vss   PT - Assessment/Plan  PT Visit Diagnosis Muscle weakness (generalized) (M62.81);Other abnormalities of gait and mobility (R26.89);Difficulty in walking, not elsewhere classified (R26.2)  PT Frequency (ACUTE ONLY) Min 3X/week  Follow Up Recommendations Home health PT;Supervision/Assistance - 24 hour  PT equipment None recommended by PT  AM-PAC PT "6 Clicks" Mobility Outcome Measure (Version 2)  Help needed turning from your back to your side while in a flat bed without using bedrails? 2  Help needed moving from lying on your  back to sitting on the side of a flat bed without using bedrails? 2  Help needed moving to and from a bed to a chair (including a wheelchair)? 2  Help needed standing up from a chair using your arms (e.g., wheelchair or bedside chair)? 2  Help needed to walk in hospital room? 1  Help needed climbing 3-5 steps with a railing?  1  6 Click Score 10  Consider Recommendation of Discharge To: CIR/SNF/LTACH  PT Goal Progression  Progress towards PT goals Progressing toward goals  Acute Rehab PT Goals  PT Goal Formulation With patient/family  Time For Goal Achievement 05/31/20  Potential to Achieve Goals Fair  PT Time Calculation  PT Start Time (ACUTE ONLY) 1450  PT Stop Time (ACUTE ONLY) 1500  PT Time Calculation (min) (ACUTE ONLY) 10 min  PT General Charges  $$ ACUTE PT VISIT 1 Visit  PT Treatments  $Therapeutic Activity 8-22 mins   05/17/2020  Ginger Carne., PT Acute Rehabilitation Services [phone]  (pager) [phone]  (office)

## 2020-05-17 NOTE — Progress Notes (Addendum)
Progress Note  Patient Name: Erin Liu Date of Encounter: 05/17/2020  [facility] Cardiologist: Minus Breeding, MD   Subjective   Sleepy.  Denies chest pain or shortness of breath.  Inpatient Medications    Scheduled Meds: . aspirin EC  81 mg Oral Daily  . atorvastatin  40 mg Oral Daily  . heparin  5,000 Units Subcutaneous Q8H  . latanoprost  1 drop Both Eyes QHS   Continuous Infusions: . cefTRIAXone (ROCEPHIN)  IV 1 g (05/16/20 2132)  . doxycycline (VIBRAMYCIN) IV 100 mg (05/17/20 1034)  . lactated ringers 100 mL/hr at 05/17/20 0909   PRN Meds: acetaminophen **OR** acetaminophen, promethazine, senna-docusate   Vital Signs    Vitals:   05/17/20 0400 05/17/20 0500 05/17/20 0700 05/17/20 1100  BP: 102/66 109/71 105/66 104/88  Pulse: 93 95 90 90  Resp: 20 20 (!) 21 20  Temp:   97.7 F (36.5 C) 97.6 F (36.4 C)  TempSrc:   Oral Axillary  SpO2: 98% 96% 96% 92%  Weight:      Height:        Intake/Output Summary (Last 24 hours) at 05/17/2020 1237 Last data filed at 05/17/2020 0800 Gross per 24 hour  Intake 0 ml  Output --  Net 0 ml   Last 3 Weights 05/15/2020 05/15/2020 04/28/2020  Weight (lbs) 179 lb 3.7 oz 175 lb 174 lb  Weight (kg) 81.3 kg 79.379 kg 78.926 kg      Telemetry    Sinus rhythm- Personally Reviewed  ECG    n/a - Personally Reviewed  Physical Exam   VS:  BP 104/88 (BP Location: Right Arm)   Pulse 90   Temp 97.6 F (36.4 C) (Axillary)   Resp 20   Ht 5\' 5"  (1.651 m)   Wt 81.3 kg   SpO2 92%   BMI 29.83 kg/m  , BMI Body mass index is 29.83 kg/m. GENERAL:  Well appearing.  Lying flat in bed in no acute distress. HEENT: Pupils equal round and reactive, fundi not visualized, oral mucosa unremarkable NECK:  + jugular venous distention, waveform within normal limits, carotid upstroke brisk and symmetric, no bruits LUNGS:  Clear to auscultation bilaterally HEART:  RRR.  PMI not displaced or sustained,S1 and S2 within normal limits,  no S3, no S4, no clicks, no rubs, III/VI systolic murmurs ABD:  Flat, positive bowel sounds normal in frequency in pitch, no bruits, no rebound, no guarding, no midline pulsatile mass, no hepatomegaly, no splenomegaly EXT:  2 plus pulses throughout, 2+ pedal edema, no cyanosis no clubbing SKIN:  No rashes no nodules NEURO:  Cranial nerves II through XII grossly intact, motor grossly intact throughout [facility]:  Cognitively intact, oriented to person place and time   Labs    High Sensitivity Troponin:   Recent Labs  Lab 05/15/20 0637 05/15/20 0922 05/15/20 1550  TROPONINIHS 7,635* 7,341* 8,905*      Chemistry Recent Labs  Lab 05/14/20 2317 05/14/20 2338 05/15/20 0637 05/16/20 0013 05/17/20 0102  NA 136   < > 137 135 136  K 5.1   < > 5.2* 4.9 4.4  CL 103   < > 103 106 108  CO2 14*  --  20* 18* 18*  GLUCOSE 169*   < > 149* 130* 130*  BUN 81*   < > 75* 76* 75*  CREATININE 4.41*   < > 4.19* 4.20* 4.26*  CALCIUM 9.8  --  9.6 9.3 9.0  PROT 6.3*  --  6.0* 5.6*  --   ALBUMIN 3.4*  --  3.0* 3.0*  --   AST 99*  --  98* 109*  --   ALT 46*  --  48* 52*  --   ALKPHOS 102  --  90 97  --   BILITOT 1.8*  --  1.6* 1.3*  --   GFRNONAA 10*  --  10* 10* 10*  ANIONGAP 19*  --  14 11 10    < > = values in this interval not displayed.     Hematology Recent Labs  Lab 05/15/20 0736 05/16/20 0013 05/17/20 0102  WBC 5.2 5.4 5.8  RBC 3.57* 3.59* 3.42*  HGB 11.3* 11.5* 11.2*  HCT 35.6* 35.1* 32.8*  MCV 99.7 97.8 95.9  MCH 31.7 32.0 32.7  MCHC 31.7 32.8 34.1  RDW 18.9* 18.9* 19.5*  PLT 66* 77* 66*    BNPNo results for input(s): BNP, PROBNP in the last 168 hours.   DDimer No results for input(s): DDIMER in the last 168 hours.   Radiology    ECHOCARDIOGRAM COMPLETE  Result Date: 05/16/2020    ECHOCARDIOGRAM REPORT   Patient Name:   ADABELLA STANIS Keating Date of Exam: 05/16/2020 Medical Rec #:  [id number]        Height:       65.0 in Accession #:    [id number]       Weight:       179.2 lb  Date of Birth:  26-Sep-1940         BSA:          1.888 m Patient Age:    80 years         BP:           94/71 mmHg Patient Gender: F                HR:           94 bpm. Exam Location:  Inpatient Procedure: 2D Echo Indications:    Elevated Troponin  History:        Patient has prior history of Echocardiogram examinations, most                 recent 10/03/2017. Risk Factors:Hypertension and Dyslipidemia.  Sonographer:    Mikki Santee RDCS (AE) Referring Phys: [id number] [place]  1. Left ventricular ejection fraction, by estimation, is 20 to 25%. The left ventricle has severely decreased function. The left ventricle demonstrates global hypokinesis. The left ventricular internal cavity size was moderately dilated. Left ventricular diastolic parameters are indeterminate. There is the interventricular septum is flattened in diastole ('D' shaped left ventricle), consistent with right ventricular volume overload.  2. Right ventricular systolic function is moderately reduced. The right ventricular size is mildly enlarged. There is severely elevated pulmonary artery systolic pressure. The estimated right ventricular systolic pressure is 42.5 mmHg.  3. Left atrial size was moderately dilated.  4. Right atrial size was severely dilated.  5. The mitral valve is degenerative. 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 No evidence of mitral stenosis.  6. Tricuspid valve regurgitation is severe.  7. Small mobile strand on aortic aspect of left coronary cusp, not well seen on prior study from 10/03/2017. The aortic valve is grossly normal. Aortic valve regurgitation is trivial. No aortic stenosis is present.  8. The inferior vena cava is dilated in size with <50% respiratory variability, suggesting right atrial pressure of 15 mmHg. FINDINGS  Left Ventricle:  Left ventricular ejection fraction, by estimation, is 20 to 25%. The left ventricle has severely decreased  function. The left ventricle demonstrates global hypokinesis. The left ventricular internal cavity size was moderately dilated. There is no left ventricular hypertrophy. The interventricular septum is flattened in diastole ('D' shaped left ventricle), consistent with right ventricular volume overload and abnormal (paradoxical) septal motion, consistent with left bundle branch block. Left ventricular diastolic parameters are indeterminate. Right Ventricle: The right ventricular size is mildly enlarged. No increase in right ventricular wall thickness. Right ventricular systolic function is moderately reduced. There is severely elevated pulmonary artery systolic pressure. The tricuspid regurgitant velocity is 3.42 m/s, and with an assumed right atrial pressure of 15 mmHg, the estimated right ventricular systolic pressure is 32.9 mmHg. Left Atrium: Left atrial size was moderately dilated. Volume likely underestimated. Right Atrium: Right atrial size was severely dilated. Pericardium: Trivial pericardial effusion is present. Mitral Valve: The mitral valve is degenerative in appearance. Mild to moderate mitral annular calcification. Severe mitral valve regurgitation. No evidence of mitral valve stenosis. Tricuspid Valve: The tricuspid valve is grossly normal. Tricuspid valve regurgitation is severe. Aortic Valve: Small mobile strand on aortic aspect of left coronary cusp, not well seen on prior study from 10/03/2017. The aortic valve is grossly normal. Aortic valve regurgitation is trivial. No aortic stenosis is present. Pulmonic Valve: The pulmonic valve was normal in structure. Pulmonic valve regurgitation is trivial. Aorta: The aortic root is normal in size and structure. Venous: The inferior vena cava is dilated in size with less than 50% respiratory variability, suggesting right atrial pressure of 15 mmHg. IAS/Shunts: No atrial level shunt detected by color flow Doppler.  LEFT VENTRICLE PLAX 2D LVIDd:         6.30 cm  LVIDs:         5.80 cm LV PW:         0.90 cm LV IVS:        0.90 cm LVOT diam:     2.20 cm LV SV:         25 LV SV Index:   13 LVOT Area:     3.80 cm  RIGHT VENTRICLE RV S prime:     6.40 cm/s TAPSE (M-mode): 1.3 cm LEFT ATRIUM             Index       RIGHT ATRIUM           Index LA diam:        4.20 cm 2.22 cm/m  RA Area:     28.40 cm LA Vol (A2C):   93.6 ml 49.57 ml/m RA Volume:   110.00 ml 58.26 ml/m LA Vol (A4C):   50.6 ml 26.80 ml/m LA Biplane Vol: 69.2 ml 36.65 ml/m  AORTIC VALVE LVOT Vmax:   42.90 cm/s LVOT Vmean:  27.500 cm/s LVOT VTI:    0.066 m  AORTA Ao Root diam: 2.80 cm MR Peak grad:    86.5 mmHg   TRICUSPID VALVE MR Mean grad:    49.0 mmHg   TR Peak grad:   46.8 mmHg MR Vmax:         465.00 cm/s TR Vmax:        342.00 cm/s MR Vmean:        325.0 cm/s MR PISA:         3.08 cm    SHUNTS MR PISA Eff ROA: 25 mm      Systemic VTI:  0.07 m MR PISA Radius:  0.70 cm     Systemic Diam: 2.20 cm Cherlynn Kaiser MD Electronically signed by Cherlynn Kaiser MD Signature Date/Time: 05/16/2020/12:32:22 PM    Final     Cardiac Studies   Echo 05/16/20: 1. Left ventricular ejection fraction, by estimation, is 20 to 25%. The  left ventricle has severely decreased function. The left ventricle  demonstrates global hypokinesis. The left ventricular internal cavity size  was moderately dilated. Left  ventricular diastolic parameters are indeterminate. There is the  interventricular septum is flattened in diastole ('D' shaped left  ventricle), consistent with right ventricular volume overload.  2. Right ventricular systolic function is moderately reduced. The right  ventricular size is mildly enlarged. There is severely elevated pulmonary  artery systolic pressure. The estimated right ventricular systolic  pressure is 91.4 mmHg.  3. Left atrial size was moderately dilated.  4. Right atrial size was severely dilated.  5. The mitral valve is degenerative. Severe mitral valve regurgitation,   mechanism likely secondary MR due to LV dysfunction. No evidence of mitral  stenosis.  6. Tricuspid valve regurgitation is severe.  7. Small mobile strand on aortic aspect of left coronary cusp, not well  seen on prior study from 10/03/2017. The aortic valve is grossly normal.  Aortic valve regurgitation is trivial. No aortic stenosis is present.  8. The inferior vena cava is dilated in size with <50% respiratory  variability, suggesting right atrial pressure of 15 mmHg.   Patient Profile     CHANIKA BYLAND is a 80 y.o. female with a hx of diabetes, hypertension, LBBB, dementia, and CKD IV admitted with pneumonia and fall. Cardiology consulted for elevated troponin. Hospitalization complicated by acute on chronic renal failure and aneuria.  Assessment & Plan    # Elevated troponin: Troponin is markedly elevated to nearly 9000.  She has a chronic left bundle branch block.  She continues to deny chest pain or pressure.  No ischemic work up given her comorbid disease.  Continue aspirin 81 mg daily.  Discussed conservative management with her daughter.  Continue home atorvastatin and add low-dose beta-blocker as able.  Blood pressure is too low for now.  # Acute systolic and diastolic heart failure: # Cardiogenic shock:   Echo yesterday revealed severe biventricular heart failure.  LVEF 20 to 25% with moderately reduced right ventricular function.  PA SP was 61.8 mmHg and right atrial pressure 15 mmHg.  I suspect that cardiogenic shock is contributing to her anuria.  Discussed options with her daughter.  Her daughter actually works at hospice.  She is in agreement with calling and palliative care.  For now, she would like to give an attempt at inotropes and diuresis.  We will place a PICC line and start IV milrinone once it is placed.  Follow CVP every 4 hours and attempt to diurese as able. Check co-ox.  # Unresponsiveness/Fall:  Unclear if this was syncope.  She is unable to stand for  orthostatics.    # Acute on chronic renal failure:  # Aneuria: Stopping IV fluids.  Her vascular access has failed.  However no plans for dialysis.  Will clear PICC line with nephrology.  # CAP: # Septic shock: Antibiotics per primary team. Likely mixed septic/cardiogenic shock.  Total critical care time: 45 minutes. Critical care time was exclusive of separately billable procedures and treating other patients. Critical care was necessary to treat or prevent imminent or life-threatening deterioration. Critical care was time spent personally by me on the following activities: development  of treatment plan with patient and/or surrogate as well as nursing, discussions with consultants, evaluation of patient's response to treatment, examination of patient, obtaining history from patient or surrogate, ordering and performing treatments and interventions, ordering and review of laboratory studies, ordering and review of radiographic studies, pulse oximetry and re-evaluation of patient's condition.      For questions or updates, please contact [place] Please consult [email or web address] for contact info under        Signed, Skeet Latch, MD  05/17/2020, 12:37 PM

## 2020-05-17 NOTE — Evaluation (Signed)
Physical Therapy Evaluation Patient Details Name: Erin Liu MRN: [id number] DOB: 01-05-1941 Today's Date: 05/17/2020   History of Present Illness  pt is an 80 y/o female presenting 3/9 after a fall at home and being unresponsive.  Work up found acute on chronic renal failure, CAP with sepsis and acute metabolic encephalopathy.  Clinical Impression  Pt admitted with/for fall/unresponsiveness/CAP.  Pt still in a lethargic, confused state and needing mod to max assist for basic mobility..  Pt currently limited functionally due to the problems listed. ( See problems list.)   Pt will benefit from PT to maximize function and safety in order to get ready for next venue listed below.     Follow Up Recommendations Home health PT;Supervision/Assistance - 24 hour    Equipment Recommendations  None recommended by PT    Recommendations for Other Services       Precautions / Restrictions Precautions Precautions: Fall      Mobility  Bed Mobility Overal bed mobility: Needs Assistance Bed Mobility: Supine to Sit     Supine to sit: Max assist     General bed mobility comments: cues for direction, but with little ability for pt to initiate to command.  Use of the pad to pivot and truncal assist up and forward with significant posterior and Left bias.    Transfers Overall transfer level: Needs assistance   Transfers: Sit to/from Stand;Stand Pivot Transfers Sit to Stand: Mod assist Stand pivot transfers: Mod assist       General transfer comment: cues for direction and assist up and forward.  Face to face pivot to the chair, pt hugging for assist  Ambulation/Gait             General Gait Details: not able today  Stairs            Wheelchair Mobility    Modified Rankin (Stroke Patients Only)       Balance Overall balance assessment: Needs assistance Sitting-balance support: Single extremity supported;Bilateral upper extremity supported;Feet supported Sitting  balance-Leahy Scale: Poor Sitting balance - Comments: posterior and left lean, pt able to hold off center for seconds before slowly falling backwards.   Standing balance support: Bilateral upper extremity supported Standing balance-Leahy Scale: Poor Standing balance comment: reliant on external support                             Pertinent Vitals/Pain Pain Assessment: Faces Faces Pain Scale: Hurts a little bit Pain Location: general Pain Descriptors / Indicators: Sore Pain Intervention(s): Monitored during session    Home Living Family/patient expects to be discharged to:: Private residence Living Arrangements: Children;Other (Comment) (lives with son who is on disability and there as needed.) Available Help at Discharge: Family;Available 24 hours/day Type of Home: House       Home Layout: One level Home Equipment: [place] - 2 wheels;Cane - quad;Shower seat;Grab bars - tub/shower;Wheelchair - manual      Prior Function Level of Independence: Needs assistance   Gait / Transfers Assistance Needed: uses cane around the home without need for guarding and w/c out of the house.           Hand Dominance        Extremity/Trunk Assessment   Upper Extremity Assessment Upper Extremity Assessment: Generalized weakness    Lower Extremity Assessment Lower Extremity Assessment: Generalized weakness       Communication   Communication: No difficulties  Cognition Arousal/Alertness: Lethargic;Awake/alert Behavior  During Therapy: WFL for tasks assessed/performed Overall Cognitive Status: Impaired/Different from baseline Area of Impairment: Attention;Orientation;Following commands;Awareness                 Orientation Level: Situation;Time Current Attention Level: Sustained   Following Commands: Follows one step commands with increased time   Awareness: Intellectual          General Comments General comments (skin integrity, edema, etc.): VSS on 3-4L  [place]  [name] son/dtr arrived at end of session.  They were updated on her status/mobility and they related trying to get pt set up with [facility] presently to start April 1st.    Exercises Other Exercises Other Exercises: Warm up LE ROM exercise prior to mobility.   Assessment/Plan    PT Assessment Patient needs continued PT services  PT Problem List Decreased strength;Decreased activity tolerance;Decreased balance;Decreased mobility;Decreased coordination;Decreased knowledge of use of DME;Cardiopulmonary status limiting activity       PT Treatment Interventions DME instruction;Gait training;Functional mobility training;Therapeutic activities;Therapeutic exercise;Patient/family education;Balance training    PT Goals (Current goals can be found in the Care Plan section)  Acute Rehab PT Goals Patient Stated Goal: get home and into [facility] PT Goal Formulation: With patient/family Time For Goal Achievement: 05/31/20 Potential to Achieve Goals: Fair    Frequency Min 3X/week   Barriers to discharge        Co-evaluation               AM-PAC PT "6 Clicks" Mobility  Outcome Measure Help needed turning from your back to your side while in a flat bed without using bedrails?: A Lot Help needed moving from lying on your back to sitting on the side of a flat bed without using bedrails?: A Lot Help needed moving to and from a bed to a chair (including a wheelchair)?: A Lot Help needed standing up from a chair using your arms (e.g., wheelchair or bedside chair)?: A Lot Help needed to walk in hospital room?: Total Help needed climbing 3-5 steps with a railing? : Total 6 Click Score: 10    End of Session Equipment Utilized During Treatment: Oxygen Activity Tolerance: Patient tolerated treatment well;Patient limited by lethargy;Patient limited by fatigue Patient left: in chair;with call bell/phone within reach;with family/visitor present Nurse Communication: Mobility status PT Visit Diagnosis:  Muscle weakness (generalized) (M62.81);Other abnormalities of gait and mobility (R26.89);Difficulty in walking, not elsewhere classified (R26.2)    Time: 3546-5681 PT Time Calculation (min) (ACUTE ONLY): 34 min   Charges:   PT Evaluation $PT Eval Moderate Complexity: 1 Mod PT Treatments $Therapeutic Activity: 8-22 mins        05/17/2020  Ginger Carne., PT Acute Rehabilitation Services [phone]  (pager) [phone]  (office)  Tessie Fass Jaz Mallick 05/17/2020, 1:13 PM

## 2020-05-17 NOTE — Progress Notes (Signed)
Peripherally Inserted Central Catheter Placement  The IV Nurse has discussed with the patient and/or persons authorized to consent for the patient, the purpose of this procedure and the potential benefits and risks involved with this procedure.  The benefits include less needle sticks, lab draws from the catheter, and the patient may be discharged home with the catheter. Risks include, but not limited to, infection, bleeding, blood clot (thrombus formation), and puncture of an artery; nerve damage and irregular heartbeat and possibility to perform a PICC exchange if needed/ordered by physician.  Alternatives to this procedure were also discussed.  Bard Power PICC patient education guide, fact sheet on infection prevention and patient information card has been provided to patient /or left at bedside.  Consent signed by daughter  PICC Placement Documentation  PICC Double Lumen 05/17/20 PICC Right Cephalic 37 cm 1 cm (Active)  Indication for Insertion or Continuance of Line Vasoactive infusions 05/17/20 1522  Exposed Catheter (cm) 1 cm 05/17/20 1522  Site Assessment Clean;Dry;Intact 05/17/20 1522  Lumen #1 Status Flushed;Saline locked;Blood return noted 05/17/20 1522  Lumen #2 Status Flushed;Saline locked;Blood return noted 05/17/20 1522  Dressing Type Transparent 05/17/20 1522  Dressing Status Clean;Dry;Intact 05/17/20 1522  Antimicrobial disc in place? Yes 05/17/20 1522  Dressing Intervention New dressing 05/17/20 1522  Dressing Change Due 05/24/20 05/17/20 1522       Gordan Payment 05/17/2020, 3:25 PM

## 2020-05-17 NOTE — Progress Notes (Signed)
Dr Joylene Grapes, nephrology, states ok to place PICC line.

## 2020-05-17 NOTE — Progress Notes (Signed)
Nephrology Follow-Up Consult note   Assessment/Recommendations: Erin Liu is a/an 80 y.o. 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  No significant obstruction on renal ultrasound.  No significant urine output documented. -Continue with IV hydration for now with lactated Ringer's 100 cc/hr; could stop for worsening hypoxia -Obtain bladder scans and consider Foley catheter for urinary retention -Per outpatient records no plans for dialysis given the patient's comorbidities -Continue supportive care for shock as below -Continue antibiotics  Sepsis/UTI/PNA: With shock now improving after fluids.  Continue hydration as above and antibiotics per primary team.  Possible UTI and or PNA.  Blood culture with staph epidermidis; likely contaminant  Acute hypoxic respiratory failure: Possibly related to pneumonia.  Oxygen requirement largely unchanged.  Management per primary team  Acute encephalopathy: Likely toxic metabolic related to sepsis and electrolyte abnormalities with underlying dementia.  Continue supportive measures  Anemia: Mild with hemoglobin 11.2.  Continue to monitor  Thrombocytopenia: Persistent and had been decreasing in the outpatient setting.  Work-up and management per primary team  Elevated troponin: Management per primary team and cardiology.  Conservative management chosen at this time   Recommendations conveyed to primary service.    [facility] 05/17/2020 9:25 AM  ___________________________________________________________  CC: AKI on CKD  Interval History/Subjective: Patient resting in bed.  Hard to arouse.  Unable to answer questions consistently.  Urine output not documented.  Some report of no urine output.  However creatinine is relatively stable.   Medications:  Current  Facility-Administered Medications  Medication Dose Route Frequency Provider Last Rate Last Admin  . acetaminophen (TYLENOL) tablet 650 mg  650 mg Oral Q6H PRN Chotiner, Yevonne Aline, MD       Or  . acetaminophen (TYLENOL) suppository 650 mg  650 mg Rectal Q6H PRN Chotiner, Yevonne Aline, MD      . aspirin EC tablet 81 mg  81 mg Oral Daily Skeet Latch, MD   81 mg at 05/16/20 1011  . atorvastatin (LIPITOR) tablet 40 mg  40 mg Oral Daily Chotiner, Yevonne Aline, MD   40 mg at 05/16/20 1011  . cefTRIAXone (ROCEPHIN) 1 g in sodium chloride 0.9 % 100 mL IVPB  1 g Intravenous Q24H Chotiner, Yevonne Aline, MD 200 mL/hr at 05/16/20 2132 1 g at 05/16/20 2132  . doxycycline (VIBRAMYCIN) 100 mg in sodium chloride 0.9 % 250 mL IVPB  100 mg Intravenous BID Chotiner, Yevonne Aline, MD 125 mL/hr at 05/16/20 2251 100 mg at 05/16/20 2251  . heparin injection 5,000 Units  5,000 Units Subcutaneous Q8H Chotiner, Yevonne Aline, MD   5,000 Units at 05/17/20 [phone]  . lactated ringers infusion   Intravenous Continuous Reesa Chew, MD 100 mL/hr at 05/17/20 0909 New Bag at 05/17/20 0909  . latanoprost (XALATAN) 0.005 % ophthalmic solution 1 drop  1 drop Both Eyes QHS Chotiner, Yevonne Aline, MD   1 drop at 05/16/20 2141  . promethazine (PHENERGAN) injection 12.5 mg  12.5 mg Intravenous Q6H PRN Chotiner, Yevonne Aline, MD   12.5 mg at 05/16/20 1704  . senna-docusate (Senokot-S) tablet 1 tablet  1 tablet Oral QHS PRN Chotiner, Yevonne Aline, MD          Review of Systems: Unable to obtain due to the patient's altered mental status  Physical Exam: Vitals:   05/17/20 0500 05/17/20  0700  BP: 109/71 105/66  Pulse: 95 90  Resp: 20 (!) 21  Temp:  97.7 F (36.5 C)  SpO2: 96% 96%   No intake/output data recorded. No intake or output data in the 24 hours ending 05/17/20 2725 Constitutional: Chronically ill-appearing, lying in bed, no distress ENMT: ears and nose without scars or lesions, MMM CV: normal rate, no edema Respiratory: Bilateral  chest rise with no increased work of breathing, lying flat Gastrointestinal: soft, non-tender, no palpable masses or hernias Skin: no visible lesions or rashes Psych: Awake, alert, difficulty answering questions   Test Results I personally reviewed new and old clinical labs and radiology tests Lab Results  Component Value Date   NA 136 05/17/2020   K 4.4 05/17/2020   CL 108 05/17/2020   CO2 18 (L) 05/17/2020   BUN 75 (H) 05/17/2020   CREATININE 4.26 (H) 05/17/2020   GFR 16.75 (L) 03/03/2020   CALCIUM 9.0 05/17/2020   ALBUMIN 3.0 (L) 05/16/2020   PHOS 4.5 05/17/2020

## 2020-05-17 NOTE — Progress Notes (Signed)
PROGRESS NOTE  Erin Liu HCW:[id number] DOB: 1940-11-14 DOA: 05/14/2020 PCP: Binnie Rail, MD   LOS: 2 days   Brief narrative:  Erin Liu is a 80 y.o. female with medical history significant for history of anemia, diabetes mellitus, dementia, hyperlipidemia, hypertension, renal failure not on dialysis who presented to the EMS after a fall at home and being unresponsive. In the ED, patient was found to be hypothermic and hypotensive.  Patient was given IV fluid hydration and placed on a Retail banker.  CT of the head and neck were obtained and showed no acute pathology.  Patient was found to have elevated creatinine of 4.4 from her baseline of 2.6.  Patient was then admitted hospital for further evaluation and treatment.  During hospitalization, patient was noted to have extremely elevated troponin and cardiology was consulted.  However denied any chest pain.  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  Assessment/Plan:  Principal Problem:   Renal failure (ARF), acute on chronic (HCC) Active Problems:   Hypotension   Hypothermia   CAP (community acquired pneumonia)   Acute metabolic encephalopathy   Sepsis ([place])   Fall at home, initial encounter   Prolonged QT interval   Elevated lactic acid level  Mild Acute kidney injury on chronic kidney disease stage IV.     Renal ultrasound showed small kidneys mostly on the left with increased echogenicity.  Nephrology on board.  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  We will continue to monitor BMP.  Severe sepsis likely secondary to community-acquired pneumonia with acute hypoxic respiratory failure.  Continue Rocephin and doxycycline.    On 3 L of oxygen. Blood cultures with staph epidermidis.  UA with large protein, leukocytes hemoglobin and WBC.  Urine culture without any growth so far.  Lactate improved with IV fluids and antibiotics..  Hypotension   Continue to hold antihypertensives.  Continue  Ringer lactate for now.  Blood pressure of 104/88  Significantly elevated troponin. Could be secondary to sepsis, acute kidney injury.  Patient does have history of LBBB.  Seen by cardiology for significantly elevated troponin.  Patient was seen by cardiology and is on conservative treatment.  2D echocardiogram showed LV ejection fraction of 20 to 25% with global hypokinesis.  Continue aspirin, Lipitor.  Could benefit from low-dose beta-blockers if blood pressure is better  Hypothermia On presentation.  Has resolved  Acute metabolic encephalopathy Likely secondary to acute kidney injury and sepsis.  With baseline dementia.  There was some concern for slurred speech as well.  CT of the head did not show any hemorrhage or acute stroke.   MRI of the brain was degraded study but most compatible with chronic small vessel disease.  Fall at home, initial encounter Continue precautions.    Will get PT evaluation when stable.  Prolonged QT interval Correct electrolytes.  Avoid QT prolonging agents.  DVT prophylaxis: heparin injection 5,000 Units Start: 05/15/20 0600   Code Status: Full code  Family Communication:   None today.  Status is: Inpatient  Remains inpatient appropriate because:IV treatments appropriate due to intensity of illness or inability to take PO and Inpatient level of care appropriate due to severity of illness,   Dispo: The patient is from: Home              Anticipated d/c is to: Unknown at this time, will get PT evaluation.  Patient currently is not medically stable to d/c.   Difficult to place patient No  Consultants:  Cardiology  Procedures:  None  Anti-infectives:  Marland Kitchen Rocephin and doxycycline  Anti-infectives (From admission, onward)   Start     Dose/Rate Route Frequency Ordered Stop   05/15/20 2200  cefTRIAXone (ROCEPHIN) 1 g in sodium chloride 0.9 % 100 mL IVPB        1 g 200 mL/hr over 30 Minutes Intravenous Every 24 hours  05/15/20 0506     05/15/20 0615  doxycycline (VIBRAMYCIN) 100 mg in sodium chloride 0.9 % 250 mL IVPB        100 mg 125 mL/hr over 120 Minutes Intravenous 2 times daily 05/15/20 0506     05/15/20 0045  cefTRIAXone (ROCEPHIN) 1 g in sodium chloride 0.9 % 100 mL IVPB        1 g 200 mL/hr over 30 Minutes Intravenous  Once 05/15/20 0031 05/15/20 0136   05/15/20 0045  azithromycin (ZITHROMAX) 500 mg in sodium chloride 0.9 % 250 mL IVPB        500 mg 250 mL/hr over 60 Minutes Intravenous  Once 05/15/20 0031 05/15/20 0237     Subjective: Today, patient was seen and examined at bedside.  Slightly sleepy.  Denies any chest pain or shortness of breath  Objective: Vitals:   05/17/20 0500 05/17/20 0700  BP: 109/71 105/66  Pulse: 95 90  Resp: 20 (!) 21  Temp:  97.7 F (36.5 C)  SpO2: 96% 96%   No intake or output data in the 24 hours ending 05/17/20 0930 Filed Weights   05/15/20 1227 05/15/20 2210  Weight: 79.4 kg 81.3 kg   Body mass index is 29.83 kg/m.   Physical Exam: GENERAL: Patient is mildly somnolent, not in obvious distress.  on nasal cannula oxygen HENT: No scleral pallor or icterus. Pupils equally reactive to light. Oral mucosa is moist NECK: is supple, no gross swelling noted. CHEST: Clear to auscultation. No crackles or wheezes.  Diminished breath sounds bilaterally. CVS: S1 and S2 heard, no murmur. Regular rate and rhythm.  ABDOMEN: Soft, non-tender, bowel sounds are present. EXTREMITIES: No lower extremity edema noted CNS: Cranial nerves are intact.  Moves all extremities SKIN: warm and dry without rashes.  Data Review: I have personally reviewed the following laboratory data and studies,  CBC: Recent Labs  Lab 05/14/20 2317 05/14/20 2338 05/15/20 0736 05/16/20 0013 05/17/20 0102  WBC 6.1  --  5.2 5.4 5.8  NEUTROABS 2.7  --   --   --   --   HGB 12.6 7.5* 11.3* 11.5* 11.2*  HCT 40.2 22.0* 35.6* 35.1* 32.8*  MCV 102.0*  --  99.7 97.8 95.9  PLT 80*  --  66*  77* 66*   Basic Metabolic Panel: Recent Labs  Lab 05/14/20 2317 05/14/20 2338 05/15/20 0637 05/16/20 0013 05/17/20 0102  NA 136 146* 137 135 136  K 5.1 3.1* 5.2* 4.9 4.4  CL 103 120* 103 106 108  CO2 14*  --  20* 18* 18*  GLUCOSE 169* 85 149* 130* 130*  BUN 81* 65* 75* 76* 75*  CREATININE 4.41* 1.80* 4.19* 4.20* 4.26*  CALCIUM 9.8  --  9.6 9.3 9.0  MG  --   --   --  2.1 1.9  PHOS  --   --   --  5.0* 4.5   Liver Function Tests: Recent Labs  Lab 05/14/20 2317 05/15/20 0637 05/16/20 0013  AST 99* 98* 109*  ALT 46* 48* 52*  ALKPHOS 102 90 97  BILITOT 1.8* 1.6* 1.3*  PROT 6.3* 6.0* 5.6*  ALBUMIN 3.4* 3.0* 3.0*   No results for input(s): LIPASE, AMYLASE in the last 168 hours. No results for input(s): AMMONIA in the last 168 hours. Cardiac Enzymes: Recent Labs  Lab 05/14/20 2331  CKTOTAL 241*   BNP (last 3 results) No results for input(s): BNP in the last 8760 hours.  ProBNP (last 3 results) No results for input(s): PROBNP in the last 8760 hours.  CBG: Recent Labs  Lab 05/14/20 2317  GLUCAP 134*   Recent Results (from the past 240 hour(s))  Urine culture     Status: None   Collection Time: 05/14/20 11:18 PM   Specimen: Urine, Random  Result Value Ref Range Status   Specimen Description URINE, RANDOM  Final   Special Requests NONE  Final   Culture   Final    NO GROWTH Performed at [facility], [address], [place], [place] [address]    Report Status 05/16/2020 FINAL  Final  Culture, blood (Routine x 2)     Status: None (Preliminary result)   Collection Time: 05/14/20 11:27 PM   Specimen: BLOOD  Result Value Ref Range Status   Specimen Description BLOOD LEFT ANTECUBITAL  Final   Special Requests   Final    BOTTLES DRAWN AEROBIC AND ANAEROBIC Blood Culture adequate volume   Culture  Setup Time   Final    GRAM POSITIVE COCCI IN CLUSTERS AEROBIC BOTTLE ONLY CRITICAL RESULT CALLED TO, READ BACK BY AND VERIFIED WITH: T,JACKY PHARMD @0902   05/16/20 EB Performed at [facility], [place] [address], [place], [place] [address]    Culture GRAM POSITIVE COCCI IN CLUSTERS  Final   Report Status PENDING  Incomplete  Blood Culture ID Panel (Reflexed)     Status: Abnormal   Collection Time: 05/14/20 11:27 PM  Result Value Ref Range Status   Enterococcus faecalis NOT DETECTED NOT DETECTED Final   Enterococcus Faecium NOT DETECTED NOT DETECTED Final   Listeria monocytogenes NOT DETECTED NOT DETECTED Final   Staphylococcus species DETECTED (A) NOT DETECTED Final    Comment: CRITICAL RESULT CALLED TO, READ BACK BY AND VERIFIED WITH: T,JACKY PHARMD @0902  05/16/20 EB    Staphylococcus aureus (BCID) NOT DETECTED NOT DETECTED Final   Staphylococcus epidermidis DETECTED (A) NOT DETECTED Final    Comment: Methicillin (oxacillin) resistant coagulase negative staphylococcus. Possible blood culture contaminant (unless isolated from more than one blood culture draw or clinical case suggests pathogenicity). No antibiotic treatment is indicated for blood  culture contaminants. CRITICAL RESULT CALLED TO, READ BACK BY AND VERIFIED WITH: T,JACKY PHARMD @0902  05/16/20 EB    Staphylococcus lugdunensis NOT DETECTED NOT DETECTED Final   Streptococcus species NOT DETECTED NOT DETECTED Final   Streptococcus agalactiae NOT DETECTED NOT DETECTED Final   Streptococcus pneumoniae NOT DETECTED NOT DETECTED Final   Streptococcus pyogenes NOT DETECTED NOT DETECTED Final   A.calcoaceticus-baumannii NOT DETECTED NOT DETECTED Final   Bacteroides fragilis NOT DETECTED NOT DETECTED Final   Enterobacterales NOT DETECTED NOT DETECTED Final   Enterobacter cloacae complex NOT DETECTED NOT DETECTED Final   Escherichia coli NOT DETECTED NOT DETECTED Final   Klebsiella aerogenes NOT DETECTED NOT DETECTED Final   Klebsiella oxytoca NOT DETECTED NOT DETECTED Final   Klebsiella pneumoniae NOT DETECTED NOT DETECTED Final   Proteus species NOT DETECTED NOT DETECTED Final    Salmonella species NOT DETECTED NOT DETECTED Final  Serratia marcescens NOT DETECTED NOT DETECTED Final   Haemophilus influenzae NOT DETECTED NOT DETECTED Final   Neisseria meningitidis NOT DETECTED NOT DETECTED Final   Pseudomonas aeruginosa NOT DETECTED NOT DETECTED Final   Stenotrophomonas maltophilia NOT DETECTED NOT DETECTED Final   Candida albicans NOT DETECTED NOT DETECTED Final   Candida auris NOT DETECTED NOT DETECTED Final   Candida glabrata NOT DETECTED NOT DETECTED Final   Candida krusei NOT DETECTED NOT DETECTED Final   Candida parapsilosis NOT DETECTED NOT DETECTED Final   Candida tropicalis NOT DETECTED NOT DETECTED Final   Cryptococcus neoformans/gattii NOT DETECTED NOT DETECTED Final   Methicillin resistance mecA/C DETECTED (A) NOT DETECTED Final    Comment: CRITICAL RESULT CALLED TO, READ BACK BY AND VERIFIED WITH: T,JACKY PHARMD @0902  05/16/20 EB Performed at [facility], [address], [place], [place] [address]   MRSA PCR Screening     Status: None   Collection Time: 05/15/20  2:28 AM   Specimen: Nasopharyngeal  Result Value Ref Range Status   MRSA by PCR NEGATIVE NEGATIVE Final    Comment:        The GeneXpert MRSA Assay (FDA approved for NASAL specimens only), is one component of a comprehensive MRSA colonization surveillance program. It is not intended to diagnose MRSA infection nor to guide or monitor treatment for MRSA infections. Performed at [facility], [place] [address] [place], [place] [address]   Culture, blood (Routine x 2)     Status: None (Preliminary result)   Collection Time: 05/15/20  2:34 AM   Specimen: BLOOD RIGHT HAND  Result Value Ref Range Status   Specimen Description BLOOD RIGHT HAND  Final   Special Requests   Final    BOTTLES DRAWN AEROBIC AND ANAEROBIC Blood Culture results may not be optimal due to an inadequate volume of blood received in culture bottles   Culture   Final    NO GROWTH 1 DAY Performed at [facility], [place] [address], [place], [place] [address]    Report Status PENDING  Incomplete  Resp Panel by RT-PCR (Flu A&B, Covid) Nasopharyngeal Swab     Status: None   Collection Time: 05/15/20  4:24 AM   Specimen: Nasopharyngeal Swab; Nasopharyngeal(NP) swabs in vial transport medium  Result Value Ref Range Status   SARS Coronavirus 2 by RT PCR NEGATIVE NEGATIVE Final    Comment: (NOTE) SARS-CoV-2 target nucleic acids are NOT DETECTED.  The SARS-CoV-2 RNA is generally detectable in upper respiratory specimens during the acute phase of infection. The lowest concentration of SARS-CoV-2 viral copies this assay can detect is 138 copies/mL. A negative result does not preclude SARS-Cov-2 infection and should not be used as the sole basis for treatment or other patient management decisions. A negative result may occur with  improper specimen collection/handling, submission of specimen other than nasopharyngeal swab, presence of viral mutation(s) within the areas targeted by this assay, and inadequate number of viral copies(<138 copies/mL). A negative result must be combined with clinical observations, patient history, and epidemiological information. The expected result is Negative.  Fact Sheet for Patients:  EntrepreneurPulse.com.au  Fact Sheet for Healthcare Providers:  IncredibleEmployment.be  This test is no t yet approved or cleared by the [place] FDA and  has been authorized for detection and/or diagnosis of SARS-CoV-2 by FDA under an Emergency Use Authorization (EUA). This EUA will remain  in effect (meaning this test can be used) for the duration of the COVID-19 declaration  under Section 564(b)(1) of the Act, 21 U.S.C.section 360bbb-3(b)(1), unless the authorization is terminated  or revoked sooner.       Influenza A by PCR NEGATIVE NEGATIVE Final   Influenza B by PCR NEGATIVE NEGATIVE Final    Comment: (NOTE) The Xpert Xpress  SARS-CoV-2/FLU/RSV plus assay is intended as an aid in the diagnosis of influenza from Nasopharyngeal swab specimens and should not be used as a sole basis for treatment. Nasal washings and aspirates are unacceptable for Xpert Xpress SARS-CoV-2/FLU/RSV testing.  Fact Sheet for Patients: EntrepreneurPulse.com.au  Fact Sheet for Healthcare Providers: IncredibleEmployment.be  This test is not yet approved or cleared by the [place] FDA and has been authorized for detection and/or diagnosis of SARS-CoV-2 by FDA under an Emergency Use Authorization (EUA). This EUA will remain in effect (meaning this test can be used) for the duration of the COVID-19 declaration under Section 564(b)(1) of the Act, 21 U.S.C. section 360bbb-3(b)(1), unless the authorization is terminated or revoked.  Performed at [facility], [place] [address] [place], [place] [address]      Studies: ECHOCARDIOGRAM COMPLETE  Result Date: 05/16/2020    ECHOCARDIOGRAM REPORT   Patient Name:   Erin Liu Date of Exam: 05/16/2020 Medical Rec #:  [id number]        Height:       65.0 in Accession #:    [id number]       Weight:       179.2 lb Date of Birth:  1940/08/12         BSA:          1.888 m Patient Age:    48 years         BP:           94/71 mmHg Patient Gender: F                HR:           94 bpm. Exam Location:  Inpatient Procedure: 2D Echo Indications:    Elevated Troponin  History:        Patient has prior history of Echocardiogram examinations, most                 recent 10/03/2017. Risk Factors:Hypertension and Dyslipidemia.  Sonographer:    Mikki Santee RDCS (AE) Referring Phys: [id number] [place]  1. Left ventricular ejection fraction, by estimation, is 20 to 25%. The left ventricle has severely decreased function. The left ventricle demonstrates global hypokinesis. The left ventricular internal cavity size was moderately dilated. Left ventricular  diastolic parameters are indeterminate. There is the interventricular septum is flattened in diastole ('D' shaped left ventricle), consistent with right ventricular volume overload.  2. Right ventricular systolic function is moderately reduced. The right ventricular size is mildly enlarged. There is severely elevated pulmonary artery systolic pressure. The estimated right ventricular systolic pressure is 82.9 mmHg.  3. Left atrial size was moderately dilated.  4. Right atrial size was severely dilated.  5. The mitral valve is degenerative. 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 No evidence of mitral stenosis.  6. Tricuspid valve regurgitation is severe.  7. Small mobile strand on aortic aspect of left coronary cusp, not well seen on prior study from 10/03/2017. The aortic valve is grossly normal. Aortic valve regurgitation is trivial. No aortic stenosis is present.  8. The inferior vena cava is dilated in size with <50% respiratory variability, suggesting right atrial pressure  of 15 mmHg. FINDINGS  Left Ventricle: Left ventricular ejection fraction, by estimation, is 20 to 25%. The left ventricle has severely decreased function. The left ventricle demonstrates global hypokinesis. The left ventricular internal cavity size was moderately dilated. There is no left ventricular hypertrophy. The interventricular septum is flattened in diastole ('D' shaped left ventricle), consistent with right ventricular volume overload and abnormal (paradoxical) septal motion, consistent with left bundle branch block. Left ventricular diastolic parameters are indeterminate. Right Ventricle: The right ventricular size is mildly enlarged. No increase in right ventricular wall thickness. Right ventricular systolic function is moderately reduced. There is severely elevated pulmonary artery systolic pressure. The tricuspid regurgitant velocity is 3.42 m/s, and with an assumed right atrial  pressure of 15 mmHg, the estimated right ventricular systolic pressure is 73.2 mmHg. Left Atrium: Left atrial size was moderately dilated. Volume likely underestimated. Right Atrium: Right atrial size was severely dilated. Pericardium: Trivial pericardial effusion is present. Mitral Valve: The mitral valve is degenerative in appearance. Mild to moderate mitral annular calcification. Severe mitral valve regurgitation. No evidence of mitral valve stenosis. Tricuspid Valve: The tricuspid valve is grossly normal. Tricuspid valve regurgitation is severe. Aortic Valve: Small mobile strand on aortic aspect of left coronary cusp, not well seen on prior study from 10/03/2017. The aortic valve is grossly normal. Aortic valve regurgitation is trivial. No aortic stenosis is present. Pulmonic Valve: The pulmonic valve was normal in structure. Pulmonic valve regurgitation is trivial. Aorta: The aortic root is normal in size and structure. Venous: The inferior vena cava is dilated in size with less than 50% respiratory variability, suggesting right atrial pressure of 15 mmHg. IAS/Shunts: No atrial level shunt detected by color flow Doppler.  LEFT VENTRICLE PLAX 2D LVIDd:         6.30 cm LVIDs:         5.80 cm LV PW:         0.90 cm LV IVS:        0.90 cm LVOT diam:     2.20 cm LV SV:         25 LV SV Index:   13 LVOT Area:     3.80 cm  RIGHT VENTRICLE RV S prime:     6.40 cm/s TAPSE (M-mode): 1.3 cm LEFT ATRIUM             Index       RIGHT ATRIUM           Index LA diam:        4.20 cm 2.22 cm/m  RA Area:     28.40 cm LA Vol (A2C):   93.6 ml 49.57 ml/m RA Volume:   110.00 ml 58.26 ml/m LA Vol (A4C):   50.6 ml 26.80 ml/m LA Biplane Vol: 69.2 ml 36.65 ml/m  AORTIC VALVE LVOT Vmax:   42.90 cm/s LVOT Vmean:  27.500 cm/s LVOT VTI:    0.066 m  AORTA Ao Root diam: 2.80 cm MR Peak grad:    86.5 mmHg   TRICUSPID VALVE MR Mean grad:    49.0 mmHg   TR Peak grad:   46.8 mmHg MR Vmax:         465.00 cm/s TR Vmax:        342.00 cm/s MR  Vmean:        325.0 cm/s MR PISA:         3.08 cm    SHUNTS MR PISA Eff ROA: 25 mm      Systemic  VTI:  0.07 m MR PISA Radius:  0.70 cm     Systemic Diam: 2.20 cm Cherlynn Kaiser MD Electronically signed by Cherlynn Kaiser MD Signature Date/Time: 05/16/2020/12:32:22 PM    Final       Flora Lipps, MD  [facility] 05/17/2020  If 7PM-7AM, please contact night-coverage

## 2020-05-18 DIAGNOSIS — A419 Sepsis, unspecified organism: Principal | ICD-10-CM

## 2020-05-18 DIAGNOSIS — R6520 Severe sepsis without septic shock: Secondary | ICD-10-CM

## 2020-05-18 LAB — CBC
HCT: 35.6 % — ABNORMAL LOW (ref 36.0–46.0)
Hemoglobin: 12 g/dL (ref 12.0–15.0)
MCH: 32.2 pg (ref 26.0–34.0)
MCHC: 33.7 g/dL (ref 30.0–36.0)
MCV: 95.4 fL (ref 80.0–100.0)
Platelets: 63 10*3/uL — ABNORMAL LOW (ref 150–400)
RBC: 3.73 MIL/uL — ABNORMAL LOW (ref 3.87–5.11)
RDW: 19.6 % — ABNORMAL HIGH (ref 11.5–15.5)
WBC: 6.7 10*3/uL (ref 4.0–10.5)
nRBC: 2.4 % — ABNORMAL HIGH (ref 0.0–0.2)

## 2020-05-18 LAB — BASIC METABOLIC PANEL
Anion gap: 8 (ref 5–15)
BUN: 75 mg/dL — ABNORMAL HIGH (ref 8–23)
CO2: 20 mmol/L — ABNORMAL LOW (ref 22–32)
Calcium: 9.2 mg/dL (ref 8.9–10.3)
Chloride: 108 mmol/L (ref 98–111)
Creatinine, Ser: 4.27 mg/dL — ABNORMAL HIGH (ref 0.44–1.00)
GFR, Estimated: 10 mL/min — ABNORMAL LOW (ref >60)
Glucose, Bld: 117 mg/dL — ABNORMAL HIGH (ref 70–99)
Potassium: 4.4 mmol/L (ref 3.5–5.1)
Sodium: 136 mmol/L (ref 135–145)

## 2020-05-18 LAB — MAGNESIUM: Magnesium: 2.1 mg/dL (ref 1.7–2.4)

## 2020-05-18 LAB — COOXEMETRY PANEL
Carboxyhemoglobin: 0.9 % (ref 0.5–1.5)
Methemoglobin: 0.9 % (ref 0.0–1.5)
O2 Saturation: 78.5 %
Total hemoglobin: 11.6 g/dL — ABNORMAL LOW (ref 12.0–16.0)

## 2020-05-18 LAB — CULTURE, BLOOD (ROUTINE X 2): Special Requests: ADEQUATE

## 2020-05-18 LAB — PHOSPHORUS: Phosphorus: 4.4 mg/dL (ref 2.5–4.6)

## 2020-05-18 MED ORDER — FUROSEMIDE 10 MG/ML IJ SOLN
80.0000 mg | Freq: Two times a day (BID) | INTRAMUSCULAR | Status: DC
  Administered 2020-05-18 – 2020-05-20 (×4): 80 mg via INTRAVENOUS
  Filled 2020-05-18 (×5): qty 8

## 2020-05-18 NOTE — Consult Note (Signed)
Palliative Medicine Inpatient Consult Note  Reason for consult:  Low EF, advanced age, goals of care  HPI: Ms. Erin Liu is an 80 year old female with a history of anemia, DM, dementia, hyperlipidemia, hypertension and renal failure. She had a fall at home and was transferred to the Emergency Department by EMS. In the Emergency Department she was hypotensive and hypothermic. CT of head and neck showed no acute findings. ECG showed LBBB. Lactic acid was 7 and creatinine was 4.4 up from a  Baseline of 2.6. calcium was low and troponin 7635. She denied chest pain.  She was admitted 05/14/2020. Cardiology and nephrology consulted. In notes she was had had memory loss was was started on Aricept 2-3 weeks prior tot hat encounter.. Due to this at her early march appointment with nephrology a decision was mad to not pursue dialysis but to conservatively manage her chronic renal failure. Her previously placed fistula was clotted and not useable. Echo showed LV EF 20-25% with global hypokinesis. She was treated with IVF for severe sepsis and likely secondary to community acquired pneumonia treated with rocephin and doxycycline. Hypotension has now resolved. A PICC line was placed 05/17/2020 and Milrinone started, now at 6.1. She does have a prolonged QT interval. Physical therapy has evaluated patient and she is max to  moderate assistance with bed mobility and transfers. She has received two COVID -19 vaccines.    Clinical Assessment/Goals of Care: I have reviewed medical records including EPIC notes, labs and imaging, received report from bedside RN Erin Liu, and  assessed the patient. Patient is unable to stay awake for conversation.  I have called daughter Erin Liu by phone  8:13.  She  Called  me back and we had a [place] conversation. She stated she would need to discuss with her two brothers and we scheduled to meet once she was on the unit.  I met with patient, her daughter Erin Liu and Son Erin Liu to further discuss diagnosis prognosis, GOC, EOL wishes, disposition and options. The patient was not able to participate in the conversation although she was present.   Ms. Erin Liu worked in a [employer] until it closed when she was in her 51's. She has 3 children, Erin Liu, son Erin Liu (whom she lives with) and a son in [place]. She was able to walk with a wheeled walker prior to this fall. The daughter and son provide ADLs for her.Erin Liu states this is mostly due to her mother's memory issues. Patient is a [place] but does not actively participate with a church. The daughter declines chaplain services stating her pastor will see [name].   I introduced Palliative Medicine as specialized medical care for people living with serious illness. It focuses on providing relief from the symptoms and stress of a serious illness. The goal is to improve quality of life for both the patient and the family.  The family feels like [name] is not having any symptoms to manage. I brought up the memory issues which she take Aricept at home for and her breathlessness/dyspnea which she is on oxygen for.   A detailed discussion was had today regarding advanced directives.  Concepts specific to code status, artifical feeding and hydration, continued IV antibiotics and rehospitalization was had.  The difference between a aggressive medical intervention path  and a palliative comfort care path for this patient at this time was had. Values and goals of care important to patient and family were attempted to  be elicited.  The family wishes for aggressive medical treatment. They believe that everything is God's will but wish for everything to be done, even if it is futile. They agree with the earlier decision for no dialysis. They are open to community based palliative care consult at discharge.The plan is to take her home when the doctors say she is ready for home. The daughter works in the  office for [employer] and would like them to be the provider. The daughter already has filled out paperwork for [facility] and will reach out to them to let them know her mother's current status.  Discussed the importance of continued conversation with family and their  medical providers regarding overall plan of care and treatment options, ensuring decisions are within the context of the patients values and GOCs. Conversations hared with Nurse, Erin Liu.   Decision Maker: Daughter, Erin Liu  SUMMARY OF RECOMMENDATIONS    Code Status/Advance Care Planning: FULL CODE- family wishes    Symptom Management:  Delirium- frequent orientation, Aricept from home medications not continued in hospital. Family has not seen any improvement since starting in February. Breathlessness/Dyspnea- oxygen as needed. Incentive spirometry.    Psycho-social/Spiritual:   Desire for further Chaplaincy support: No  Additional Recommendations: no dialysis   Prognosis: Acute on chronic renal failure with conservative management (no dialysis) with risk for electrolyte imbalance that could cause arrhythmias and led to cardiac arrest.    Discharge Planning: Home with community based palliative care referral to [facility] (family choice). Will place transitions of care consult. Family has already started application for [facility] and will communicate with them. Family will need home health assistance at home. They do not want SNF.   Vitals with BMI 05/18/2020 05/18/2020 05/18/2020  Height - - -  Weight - - 190 lbs 11 oz  BMI - - 38.38  Systolic 184 037 543  Diastolic 70 67 71  Pulse 606 97 97     PPS: 20%   This conversation/these recommendations were discussed with patient primary care team, Dr. Louanne Liu via secure chat.  Thank you for the opportunity to participate in the care of this patient and family.   Time :7:20-7:40, 11:00-1125; 12:10-12:40  Total Time: 75 minutes Greater than 50%  of this time was  spent counseling and coordinating care related to the above assessment and plan.  Erin Spar, NP [facility] Palliative Medicine Team Team Cell Phone: [phone] Please utilize secure chat with additional questions, if there is no response within 30 minutes please call the above phone number  Palliative Medicine Team providers are available by phone from 7am to 7pm daily and can be reached through the team cell phone.  Should this patient require assistance outside of these hours, please call the patient's attending physician.

## 2020-05-18 NOTE — Progress Notes (Signed)
Progress Note  Patient Name: Erin Liu Date of Encounter: 05/18/2020  [facility] Cardiologist: Minus Breeding, MD   Subjective   Patient feels better today. Shortness of breath improved. No chest pain.  Started on milrinone 0.25 with significant improvement in co-ox 378-->78.5 CVP 12 Cr stable at 4.27. UOP remains low; no plans for HD  Inpatient Medications    Scheduled Meds: . aspirin EC  81 mg Oral Daily  . atorvastatin  40 mg Oral Daily  . Chlorhexidine Gluconate Cloth  6 each Topical Daily  . heparin  5,000 Units Subcutaneous Q8H  . latanoprost  1 drop Both Eyes QHS  . sodium chloride flush  10-40 mL Intracatheter Q12H   Continuous Infusions: . cefTRIAXone (ROCEPHIN)  IV Stopped (05/17/20 2306)  . doxycycline (VIBRAMYCIN) IV Stopped (05/18/20 0132)  . milrinone 0.25 mcg/kg/min (05/18/20 0400)   PRN Meds: acetaminophen **OR** acetaminophen, promethazine, senna-docusate, sodium chloride flush   Vital Signs    Vitals:   05/18/20 0400 05/18/20 0500 05/18/20 0600 05/18/20 0747  BP: 105/69 119/71 116/67 119/70  Pulse: 96 97 97 (!) 103  Resp: 16 17 16 16   Temp:    97.6 F (36.4 C)  TempSrc:    Oral  SpO2: 92% 92% 91% 91%  Weight:  86.5 kg    Height:        Intake/Output Summary (Last 24 hours) at 05/18/2020 4481 Last data filed at 05/18/2020 0400 Gross per 24 hour  Intake 1931 ml  Output 125 ml  Net 1806 ml   Last 3 Weights 05/18/2020 05/15/2020 05/15/2020  Weight (lbs) 190 lb 11.2 oz 179 lb 3.7 oz 175 lb  Weight (kg) 86.5 kg 81.3 kg 79.379 kg      Telemetry    Sinus tach, 1 degree AVB, PVCs - Personally Reviewed  ECG    No new tracing - Personally Reviewed  Physical Exam   GEN: Elderly, frail appearing, comfortable and laying flat in bed  Neck: +JVD Cardiac: RRR, 3/6 systolic murmur  Respiratory: Clear to auscultation bilaterally. GI: Soft, nontender, non-distended  MS: No edema; No deformity. Neuro:  Nonfocal  Psych: Normal affect    Labs    High Sensitivity Troponin:   Recent Labs  Lab 05/15/20 0637 05/15/20 0922 05/15/20 1550  TROPONINIHS 7,635* 7,341* 8,905*      Chemistry Recent Labs  Lab 05/14/20 2317 05/14/20 2338 05/15/20 0637 05/16/20 0013 05/17/20 0102 05/18/20 0105  NA 136   < > 137 135 136 136  K 5.1   < > 5.2* 4.9 4.4 4.4  CL 103   < > 103 106 108 108  CO2 14*  --  20* 18* 18* 20*  GLUCOSE 169*   < > 149* 130* 130* 117*  BUN 81*   < > 75* 76* 75* 75*  CREATININE 4.41*   < > 4.19* 4.20* 4.26* 4.27*  CALCIUM 9.8  --  9.6 9.3 9.0 9.2  PROT 6.3*  --  6.0* 5.6*  --   --   ALBUMIN 3.4*  --  3.0* 3.0*  --   --   AST 99*  --  98* 109*  --   --   ALT 46*  --  48* 52*  --   --   ALKPHOS 102  --  90 97  --   --   BILITOT 1.8*  --  1.6* 1.3*  --   --   GFRNONAA 10*  --  10* 10* 10* 10*  ANIONGAP  19*  --  14 11 10 8    < > = values in this interval not displayed.     Hematology Recent Labs  Lab 05/16/20 0013 05/17/20 0102 05/18/20 0105  WBC 5.4 5.8 6.7  RBC 3.59* 3.42* 3.73*  HGB 11.5* 11.2* 12.0  HCT 35.1* 32.8* 35.6*  MCV 97.8 95.9 95.4  MCH 32.0 32.7 32.2  MCHC 32.8 34.1 33.7  RDW 18.9* 19.5* 19.6*  PLT 77* 66* 63*    BNPNo results for input(s): BNP, PROBNP in the last 168 hours.   DDimer No results for input(s): DDIMER in the last 168 hours.   Radiology    ECHOCARDIOGRAM COMPLETE  Result Date: 05/16/2020    ECHOCARDIOGRAM REPORT   Patient Name:   Erin Liu Date of Exam: 05/16/2020 Medical Rec #:  [id number]        Height:       65.0 in Accession #:    [id number]       Weight:       179.2 lb Date of Birth:  01/17/1941         BSA:          1.888 m Patient Age:    80 years         BP:           94/71 mmHg Patient Gender: F                HR:           94 bpm. Exam Location:  Inpatient Procedure: 2D Echo Indications:    Elevated Troponin  History:        Patient has prior history of Echocardiogram examinations, most                 recent 10/03/2017. Risk Factors:Hypertension  and Dyslipidemia.  Sonographer:    Mikki Santee RDCS (AE) Referring Phys: [id number] [place]  1. Left ventricular ejection fraction, by estimation, is 20 to 25%. The left ventricle has severely decreased function. The left ventricle demonstrates global hypokinesis. The left ventricular internal cavity size was moderately dilated. Left ventricular diastolic parameters are indeterminate. There is the interventricular septum is flattened in diastole ('D' shaped left ventricle), consistent with right ventricular volume overload.  2. Right ventricular systolic function is moderately reduced. The right ventricular size is mildly enlarged. There is severely elevated pulmonary artery systolic pressure. The estimated right ventricular systolic pressure is 84.1 mmHg.  3. Left atrial size was moderately dilated.  4. Right atrial size was severely dilated.  5. The mitral valve is degenerative. 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 No evidence of mitral stenosis.  6. Tricuspid valve regurgitation is severe.  7. Small mobile strand on aortic aspect of left coronary cusp, not well seen on prior study from 10/03/2017. The aortic valve is grossly normal. Aortic valve regurgitation is trivial. No aortic stenosis is present.  8. The inferior vena cava is dilated in size with <50% respiratory variability, suggesting right atrial pressure of 15 mmHg. FINDINGS  Left Ventricle: Left ventricular ejection fraction, by estimation, is 20 to 25%. The left ventricle has severely decreased function. The left ventricle demonstrates global hypokinesis. The left ventricular internal cavity size was moderately dilated. There is no left ventricular hypertrophy. The interventricular septum is flattened in diastole ('D' shaped left ventricle), consistent with right ventricular volume overload and abnormal (paradoxical) septal motion, consistent with left bundle branch block. Left  ventricular diastolic  parameters are indeterminate. Right Ventricle: The right ventricular size is mildly enlarged. No increase in right ventricular wall thickness. Right ventricular systolic function is moderately reduced. There is severely elevated pulmonary artery systolic pressure. The tricuspid regurgitant velocity is 3.42 m/s, and with an assumed right atrial pressure of 15 mmHg, the estimated right ventricular systolic pressure is 42.5 mmHg. Left Atrium: Left atrial size was moderately dilated. Volume likely underestimated. Right Atrium: Right atrial size was severely dilated. Pericardium: Trivial pericardial effusion is present. Mitral Valve: The mitral valve is degenerative in appearance. Mild to moderate mitral annular calcification. Severe mitral valve regurgitation. No evidence of mitral valve stenosis. Tricuspid Valve: The tricuspid valve is grossly normal. Tricuspid valve regurgitation is severe. Aortic Valve: Small mobile strand on aortic aspect of left coronary cusp, not well seen on prior study from 10/03/2017. The aortic valve is grossly normal. Aortic valve regurgitation is trivial. No aortic stenosis is present. Pulmonic Valve: The pulmonic valve was normal in structure. Pulmonic valve regurgitation is trivial. Aorta: The aortic root is normal in size and structure. Venous: The inferior vena cava is dilated in size with less than 50% respiratory variability, suggesting right atrial pressure of 15 mmHg. IAS/Shunts: No atrial level shunt detected by color flow Doppler.  LEFT VENTRICLE PLAX 2D LVIDd:         6.30 cm LVIDs:         5.80 cm LV PW:         0.90 cm LV IVS:        0.90 cm LVOT diam:     2.20 cm LV SV:         25 LV SV Index:   13 LVOT Area:     3.80 cm  RIGHT VENTRICLE RV S prime:     6.40 cm/s TAPSE (M-mode): 1.3 cm LEFT ATRIUM             Index       RIGHT ATRIUM           Index LA diam:        4.20 cm 2.22 cm/m  RA Area:     28.40 cm LA Vol (A2C):   93.6 ml 49.57 ml/m RA Volume:    110.00 ml 58.26 ml/m LA Vol (A4C):   50.6 ml 26.80 ml/m LA Biplane Vol: 69.2 ml 36.65 ml/m  AORTIC VALVE LVOT Vmax:   42.90 cm/s LVOT Vmean:  27.500 cm/s LVOT VTI:    0.066 m  AORTA Ao Root diam: 2.80 cm MR Peak grad:    86.5 mmHg   TRICUSPID VALVE MR Mean grad:    49.0 mmHg   TR Peak grad:   46.8 mmHg MR Vmax:         465.00 cm/s TR Vmax:        342.00 cm/s MR Vmean:        325.0 cm/s MR PISA:         3.08 cm    SHUNTS MR PISA Eff ROA: 25 mm      Systemic VTI:  0.07 m MR PISA Radius:  0.70 cm     Systemic Diam: 2.20 cm Cherlynn Kaiser MD Electronically signed by Cherlynn Kaiser MD Signature Date/Time: 05/16/2020/12:32:22 PM    Final    [place] EKG SITE RITE  Result Date: 05/17/2020 If Site Rite image not attached, placement could not be confirmed due to current cardiac rhythm.   Cardiac Studies   Echo 05/16/20: 1. Left ventricular ejection fraction, by estimation,  is 20 to 25%. The  left ventricle has severely decreased function. The left ventricle  demonstrates global hypokinesis. The left ventricular internal cavity size  was moderately dilated. Left  ventricular diastolic parameters are indeterminate. There is the  interventricular septum is flattened in diastole ('D' shaped left  ventricle), consistent with right ventricular volume overload.  2. Right ventricular systolic function is moderately reduced. The right  ventricular size is mildly enlarged. There is severely elevated pulmonary  artery systolic pressure. The estimated right ventricular systolic  pressure is 17.4 mmHg.  3. Left atrial size was moderately dilated.  4. Right atrial size was severely dilated.  5. The mitral valve is degenerative. Severe mitral valve regurgitation,  mechanism likely secondary MR due to LV dysfunction. No evidence of mitral  stenosis.  6. Tricuspid valve regurgitation is severe.  7. Small mobile strand on aortic aspect of left coronary cusp, not well  seen on prior study from 10/03/2017. The  aortic valve is grossly normal.  Aortic valve regurgitation is trivial. No aortic stenosis is present.  8. The inferior vena cava is dilated in size with <50% respiratory  variability, suggesting right atrial pressure of 15 mmHg.    Patient Profile     80 y.o. female hx of diabetes, hypertension, LBBB, dementia, and CKD IVadmitted with pneumonia and fall. Cardiology consulted for elevated troponin. Hospitalization complicated by acute on chronic renal failure and aneuria  Assessment & Plan    # Acute systolic and diastolic heart failure: # Cardiogenic shock:  Echo 05/16/20 revealed severe biventricular heart failure.  LVEF 20 to 25% with moderately reduced right ventricular function.  PASP was 61.8 mmHg and right atrial pressure 15 mmHg. Likely contributing to her anuria.  Options discussed with the patient's daughter and Dr. Oval Linsey yesterday who agreed with palliative care consultation. In the interim, the decision was made to proceed with stabilization with inotropes and diuresis. Now s/p PICC line placement and initiation of milrinone 0.25 with improvement in co-ox from 39-->78.5. UOP remains low but Cr stabilized. Will do trial of diuresis with lasix 80mg  IV BID and monitor response given CVP 12. Continue to trend CVP, co-ox, and UOP.  # Elevated troponin: Troponin is markedly elevated to nearly 9000. She has a chronic left bundle branch block. She continues to deny chest pain or pressure. No ischemic work up given her comorbid disease and will proceed with medical management. Continue aspirin 81 mg daily.  Continue home atorvastatin. Will add GDMT as able once more clinically stable.  # Unresponsiveness/Fall:  Unclear if this was syncope. She is unable to stand for orthostatics.   # Acute on chronic renal failure:  # Aneuria: Nephrology following. No plans for HD. Managing cardiogenic shock as above.  # CAP: # Septic shock: Antibiotics per primary team. Likely mixed  septic/cardiogenic shock.   CRITICAL CARE TIME: I have spent a total of 40 minutes with patient reviewing hospital notes, telemetry, EKGs, labs and examining the patient as well as establishing an assessment and plan that was discussed with the patient.  > 50% of time was spent in direct patient care. The patient is critically ill with multi-organ system failure and requires high complexity decision making for assessment and support, frequent evaluation and titration of therapies, application of advanced monitoring technologies and extensive interpretation of multiple databases.     For questions or updates, please contact [place] Please consult [email or web address] for contact info under        Signed, Freada Bergeron,  MD  05/18/2020, 9:28 AM

## 2020-05-18 NOTE — Progress Notes (Signed)
Nephrology Follow-Up Consult note   Assessment/Recommendations: Erin Liu is a/an 80 y.o. 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  No signs of urinary obstruction.  Creatinine and BUN stable with some urine output over the past 24 hours -Heart failure management as below -Per outpatient records and current assessment no plans for dialysis given the patient's comorbidities -Continue supportive care for shock as below -Continue antibiotics  UTI/PNA: Originally concern for septic shock for heart failure as below.  Antibiotics per primary team  Biventricular heart failure: Evidenced on echocardiogram with high right-sided pressures and ejection fraction of 20 to 25%.  Trial of milrinone.  Agree with cardiology management  Acute hypoxic respiratory failure: Factorial with pneumonia and pulmonary edema contributing.  Management per primary team  Acute encephalopathy: Likely toxic metabolic related to sepsis and electrolyte abnormalities with underlying dementia.  Appears improved today.  Thrombocytopenia: Persistent and had been decreasing in the outpatient setting.  Likely multifactorial.  Work-up and management per primary team  Elevated troponin: Management per primary team and cardiology.  Conservative management chosen at this time  Dispo: Previously considered a dialysis candidate outpatient but because of worsening comorbidities she is no longer considered a dialysis candidate.  Given her biventricular heart failure I think this furthers that decision given she is unlikely to tolerate dialysis.  Palliative care is involved.  Trial of milrinone.  May consider hospice care thereafter if she does not respond   Recommendations conveyed to primary service.     [facility] 05/18/2020 9:19 AM  ___________________________________________________________  CC: AKI on CKD  Interval History/Subjective: Patient sitting up in bed.  States she feels a little bit better today.  Trying to eat breakfast.  Case discussed with Dr. Oval Linsey, with cardiology, regarding biventricular heart failure.  Agree with this assessment and fluids were stopped.   Medications:  Current Facility-Administered Medications  Medication Dose Route Frequency Provider Last Rate Last Admin  . acetaminophen (TYLENOL) tablet 650 mg  650 mg Oral Q6H PRN Chotiner, Yevonne Aline, MD       Or  . acetaminophen (TYLENOL) suppository 650 mg  650 mg Rectal Q6H PRN Chotiner, Yevonne Aline, MD      . aspirin EC tablet 81 mg  81 mg Oral Daily Skeet Latch, MD   81 mg at 05/17/20 1403  . atorvastatin (LIPITOR) tablet 40 mg  40 mg Oral Daily Chotiner, Yevonne Aline, MD   40 mg at 05/17/20 1403  . cefTRIAXone (ROCEPHIN) 1 g in sodium chloride 0.9 % 100 mL IVPB  1 g Intravenous Q24H Chotiner, Yevonne Aline, MD   Stopped at 05/17/20 2306  . Chlorhexidine Gluconate Cloth 2 % PADS 6 each  6 each Topical Daily Pokhrel, Laxman, MD   6 each at 05/17/20 1613  . doxycycline (VIBRAMYCIN) 100 mg in sodium chloride 0.9 % 250 mL IVPB  100 mg Intravenous BID Chotiner, Yevonne Aline, MD   Stopped at 05/18/20 0132  . heparin injection 5,000 Units  5,000 Units Subcutaneous Q8H Chotiner, Yevonne Aline, MD   5,000 Units at 05/18/20 0615  . latanoprost (XALATAN) 0.005 % ophthalmic solution 1 drop  1 drop Both Eyes QHS Chotiner, Yevonne Aline, MD   1 drop at 05/17/20 2221  . milrinone (PRIMACOR) 20 MG/100  ML (0.2 mg/mL) infusion  0.25 mcg/kg/min Intravenous Continuous Skeet Latch, MD 6.1 mL/hr at 05/18/20 0400 0.25 mcg/kg/min at 05/18/20 0400  . promethazine (PHENERGAN) injection 12.5 mg  12.5 mg Intravenous Q6H PRN Chotiner, Yevonne Aline, MD   12.5 mg at 05/16/20 1704  . senna-docusate (Senokot-S)  tablet 1 tablet  1 tablet Oral QHS PRN Chotiner, Yevonne Aline, MD      . sodium chloride flush (NS) 0.9 % injection 10-40 mL  10-40 mL Intracatheter Q12H Pokhrel, Laxman, MD   10 mL at 05/17/20 2222  . sodium chloride flush (NS) 0.9 % injection 10-40 mL  10-40 mL Intracatheter PRN Pokhrel, Laxman, MD          Review of Systems: 10 systems reviewed and negative except per HPI  Physical Exam: Vitals:   05/18/20 0600 05/18/20 0747  BP: 116/67 119/70  Pulse: 97 (!) 103  Resp: 16 16  Temp:  97.6 F (36.4 C)  SpO2: 91% 91%   No intake/output data recorded.  Intake/Output Summary (Last 24 hours) at 05/18/2020 0919 Last data filed at 05/18/2020 0400 Gross per 24 hour  Intake 1931 ml  Output 125 ml  Net 1806 ml   Constitutional: Chronically ill-appearing, lying in bed, no distress ENMT: ears and nose without scars or lesions, MMM CV: Tachycardia, 1+ pitting edema Respiratory: Bilateral chest rise with no increased work of breathing Gastrointestinal: soft, non-tender, no palpable masses or hernias Skin: no visible lesions or rashes Psych: Awake, alert, answering questions appropriately   Test Results I personally reviewed new and old clinical labs and radiology tests Lab Results  Component Value Date   NA 136 05/18/2020   K 4.4 05/18/2020   CL 108 05/18/2020   CO2 20 (L) 05/18/2020   BUN 75 (H) 05/18/2020   CREATININE 4.27 (H) 05/18/2020   GFR 16.75 (L) 03/03/2020   CALCIUM 9.2 05/18/2020   ALBUMIN 3.0 (L) 05/16/2020   PHOS 4.4 05/18/2020

## 2020-05-18 NOTE — Progress Notes (Signed)
PROGRESS NOTE  Erin Liu:[id number] DOB: 1940-12-13 DOA: 05/14/2020 PCP: Binnie Rail, MD   LOS: 3 days   Brief narrative:  Erin Liu is a 80 y.o. female with medical history significant for history of anemia, diabetes mellitus, dementia, hyperlipidemia, hypertension, renal failure not on dialysis who presented to the EMS after a fall at home and being unresponsive. In the ED, patient was found to be hypothermic and hypotensive.  Patient was given IV fluid hydration and placed on a Retail banker.  CT of the head and neck were obtained and showed no acute pathology.  Patient was found to have elevated creatinine of 4.4 from her baseline of 2.6.  Patient was then admitted hospital for further evaluation and treatment.  During hospitalization, patient was noted to have extremely elevated troponin and cardiology was consulted.  However denied any chest pain.  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  Assessment/Plan:  Principal Problem:   Renal failure (ARF), acute on chronic (HCC) Active Problems:   Hypotension   Hypothermia   CAP (community acquired pneumonia)   Acute metabolic encephalopathy   Sepsis ([place])   Fall at home, initial encounter   ARF (acute renal failure) (HCC)   Prolonged QT interval   Elevated lactic acid level   Cardiogenic shock (HCC)   Non-ST elevation (NSTEMI) myocardial infarction (HCC)  Mild Acute kidney injury on chronic kidney disease stage IV.     Renal ultrasound showed small kidneys mostly on the left with increased echogenicity.  Nephrology on board.    Fistula in place.  Not on dialysis.  Currently undergoing CHF management.  Acute systolic and diastolic heart failure with cardiogenic shock.  2D echocardiogram showed LV second fraction of 20 to 25% with elevated pulmonary artery systolic pressure.  Patient did have diminished urinary output so cardiology has started the patient on inotropes.  Family wished to inotropes for now.  PICC line has been  placed and milrinone has been initiated.  Urinary output has improved with milrinone drip.  Severe sepsis likely secondary to community-acquired pneumonia with acute hypoxic respiratory failure.  Continue Rocephin and doxycycline to complete a 5-day course..    On 4 L of oxygen. Blood cultures with staph epidermidis.    Urine analysis with large protein, leukocytes hemoglobin and WBC.  Urine culture without any growth so far.  Lactate improved with IV fluids and antibiotics.  Hypotension Secondary to systolic congestive heart failure. Continue to hold antihypertensives.  Cardiology on board and has been started on milrinone drip as per cardiology.  Significantly elevated troponin. Could be secondary to sepsis, acute kidney injury.  Patient does have history of LBBB.  Seen by cardiology for significantly elevated troponin.  Patient was seen by cardiology and is on conservative treatment.  2D echocardiogram showed LV ejection fraction of 20 to 25% with global hypokinesis.  Continue aspirin, Lipitor.  Could benefit from low-dose beta-blockers if blood pressure is better  Hypothermia On presentation.  Has resolved  Acute metabolic encephalopathy  Likely at baseline at this time likely secondary to acute kidney injury and sepsis.     CT of the head did not show any hemorrhage or acute stroke.   MRI of the brain was degraded study but most compatible with chronic small vessel disease.  Fall at home, initial encounter Continue precautions.    Will get PT evaluation when stable.  Prolonged QT interval Correct electrolytes.  Avoid QT prolonging agents.  DVT prophylaxis: heparin injection 5,000 Units Start: 05/15/20 0600  Code Status: Full code  Family Communication:  None today.  I spoke with the patient's daughter yesterday.  Status is: Inpatient  Remains inpatient appropriate because:IV treatments appropriate due to intensity of illness or inability to take PO and Inpatient  level of care appropriate due to severity of illness, on inotropes.   Dispo: The patient is from: Home              Anticipated d/c is to: Unknown at this time, possible home health/skilled nursing facility.  PT evaluation pending              Patient currently is not medically stable to d/c.   Difficult to place patient No  Consultants:  Cardiology  Procedures:  None  Anti-infectives:  Marland Kitchen Rocephin and doxycycline 3/10>  Anti-infectives (From admission, onward)   Start     Dose/Rate Route Frequency Ordered Stop   05/15/20 2200  cefTRIAXone (ROCEPHIN) 1 g in sodium chloride 0.9 % 100 mL IVPB        1 g 200 mL/hr over 30 Minutes Intravenous Every 24 hours 05/15/20 0506     05/15/20 0615  doxycycline (VIBRAMYCIN) 100 mg in sodium chloride 0.9 % 250 mL IVPB        100 mg 125 mL/hr over 120 Minutes Intravenous 2 times daily 05/15/20 0506     05/15/20 0045  cefTRIAXone (ROCEPHIN) 1 g in sodium chloride 0.9 % 100 mL IVPB        1 g 200 mL/hr over 30 Minutes Intravenous  Once 05/15/20 0031 05/15/20 0136   05/15/20 0045  azithromycin (ZITHROMAX) 500 mg in sodium chloride 0.9 % 250 mL IVPB        500 mg 250 mL/hr over 60 Minutes Intravenous  Once 05/15/20 0031 05/15/20 0237     Subjective: Today, patient was seen and examined at bedside.  Denies any chest pain, shortness of breath, fever or chills.  Has mildly low appetite. Objective: Vitals:   05/18/20 0600 05/18/20 0747  BP: 116/67 119/70  Pulse: 97 (!) 103  Resp: 16 16  Temp:  97.6 F (36.4 C)  SpO2: 91% 91%    Intake/Output Summary (Last 24 hours) at 05/18/2020 1012 Last data filed at 05/18/2020 0400 Gross per 24 hour  Intake 1931 ml  Output 125 ml  Net 1806 ml   Filed Weights   05/15/20 1227 05/15/20 2210 05/18/20 0500  Weight: 79.4 kg 81.3 kg 86.5 kg   Body mass index is 31.73 kg/m.   Physical Exam:  GENERAL: Patient is mildly somnolent, not in obvious distress.  on nasal cannula oxygen, obese HENT: No  scleral pallor or icterus. Pupils equally reactive to light. Oral mucosa is moist NECK: is supple, no gross swelling noted. CHEST: Clear to auscultation. No crackles or wheezes.  Diminished breath sounds bilaterally. CVS: S1 and S2 heard, no murmur. Regular rate and rhythm.  ABDOMEN: Soft, non-tender, bowel sounds are present. EXTREMITIES: No lower extremity edema noted, right upper extremity PICC line in place CNS: Cranial nerves are intact.  Moves all extremities SKIN: warm and dry without rashes.  Data Review: I have personally reviewed the following laboratory data and studies,  CBC: Recent Labs  Lab 05/14/20 2317 05/14/20 2338 05/15/20 0736 05/16/20 0013 05/17/20 0102 05/18/20 0105  WBC 6.1  --  5.2 5.4 5.8 6.7  NEUTROABS 2.7  --   --   --   --   --   HGB 12.6 7.5* 11.3* 11.5* 11.2* 12.0  HCT  40.2 22.0* 35.6* 35.1* 32.8* 35.6*  MCV 102.0*  --  99.7 97.8 95.9 95.4  PLT 80*  --  66* 77* 66* 63*   Basic Metabolic Panel: Recent Labs  Lab 05/14/20 2317 05/14/20 2338 05/15/20 0637 05/16/20 0013 05/17/20 0102 05/18/20 0105  NA 136 146* 137 135 136 136  K 5.1 3.1* 5.2* 4.9 4.4 4.4  CL 103 120* 103 106 108 108  CO2 14*  --  20* 18* 18* 20*  GLUCOSE 169* 85 149* 130* 130* 117*  BUN 81* 65* 75* 76* 75* 75*  CREATININE 4.41* 1.80* 4.19* 4.20* 4.26* 4.27*  CALCIUM 9.8  --  9.6 9.3 9.0 9.2  MG  --   --   --  2.1 1.9 2.1  PHOS  --   --   --  5.0* 4.5 4.4   Liver Function Tests: Recent Labs  Lab 05/14/20 2317 05/15/20 0637 05/16/20 0013  AST 99* 98* 109*  ALT 46* 48* 52*  ALKPHOS 102 90 97  BILITOT 1.8* 1.6* 1.3*  PROT 6.3* 6.0* 5.6*  ALBUMIN 3.4* 3.0* 3.0*   No results for input(s): LIPASE, AMYLASE in the last 168 hours. No results for input(s): AMMONIA in the last 168 hours. Cardiac Enzymes: Recent Labs  Lab 05/14/20 2331  CKTOTAL 241*   BNP (last 3 results) No results for input(s): BNP in the last 8760 hours.  ProBNP (last 3 results) No results for  input(s): PROBNP in the last 8760 hours.  CBG: Recent Labs  Lab 05/14/20 2317  GLUCAP 134*   Recent Results (from the past 240 hour(s))  Urine culture     Status: None   Collection Time: 05/14/20 11:18 PM   Specimen: Urine, Random  Result Value Ref Range Status   Specimen Description URINE, RANDOM  Final   Special Requests NONE  Final   Culture   Final    NO GROWTH Performed at [facility], [address] [place], [place] [address]    Report Status 05/16/2020 FINAL  Final  Culture, blood (Routine x 2)     Status: Abnormal (Preliminary result)   Collection Time: 05/14/20 11:27 PM   Specimen: BLOOD  Result Value Ref Range Status   Specimen Description BLOOD LEFT ANTECUBITAL  Final   Special Requests   Final    BOTTLES DRAWN AEROBIC AND ANAEROBIC Blood Culture adequate volume   Culture  Setup Time   Final    GRAM POSITIVE COCCI IN CLUSTERS AEROBIC BOTTLE ONLY CRITICAL RESULT CALLED TO, READ BACK BY AND VERIFIED WITH: T,JACKY PHARMD @0902  05/16/20 EB    Culture (A)  Final    STAPHYLOCOCCUS EPIDERMIDIS THE SIGNIFICANCE OF ISOLATING THIS ORGANISM FROM A SINGLE SET OF BLOOD CULTURES WHEN MULTIPLE SETS ARE DRAWN IS UNCERTAIN. PLEASE NOTIFY THE MICROBIOLOGY DEPARTMENT WITHIN ONE WEEK IF SPECIATION AND SENSITIVITIES ARE REQUIRED. Performed at [facility], [place] [address] [place], [place] [address]    Report Status PENDING  Incomplete  Blood Culture ID Panel (Reflexed)     Status: Abnormal   Collection Time: 05/14/20 11:27 PM  Result Value Ref Range Status   Enterococcus faecalis NOT DETECTED NOT DETECTED Final   Enterococcus Faecium NOT DETECTED NOT DETECTED Final   Listeria monocytogenes NOT DETECTED NOT DETECTED Final   Staphylococcus species DETECTED (A) NOT DETECTED Final    Comment: CRITICAL RESULT CALLED TO, READ BACK BY AND VERIFIED WITH: T,JACKY PHARMD @0902  05/16/20 EB    Staphylococcus aureus (BCID) NOT DETECTED NOT DETECTED  Final   Staphylococcus  epidermidis DETECTED (A) NOT DETECTED Final    Comment: Methicillin (oxacillin) resistant coagulase negative staphylococcus. Possible blood culture contaminant (unless isolated from more than one blood culture draw or clinical case suggests pathogenicity). No antibiotic treatment is indicated for blood  culture contaminants. CRITICAL RESULT CALLED TO, READ BACK BY AND VERIFIED WITH: T,JACKY PHARMD @0902  05/16/20 EB    Staphylococcus lugdunensis NOT DETECTED NOT DETECTED Final   Streptococcus species NOT DETECTED NOT DETECTED Final   Streptococcus agalactiae NOT DETECTED NOT DETECTED Final   Streptococcus pneumoniae NOT DETECTED NOT DETECTED Final   Streptococcus pyogenes NOT DETECTED NOT DETECTED Final   A.calcoaceticus-baumannii NOT DETECTED NOT DETECTED Final   Bacteroides fragilis NOT DETECTED NOT DETECTED Final   Enterobacterales NOT DETECTED NOT DETECTED Final   Enterobacter cloacae complex NOT DETECTED NOT DETECTED Final   Escherichia coli NOT DETECTED NOT DETECTED Final   Klebsiella aerogenes NOT DETECTED NOT DETECTED Final   Klebsiella oxytoca NOT DETECTED NOT DETECTED Final   Klebsiella pneumoniae NOT DETECTED NOT DETECTED Final   Proteus species NOT DETECTED NOT DETECTED Final   Salmonella species NOT DETECTED NOT DETECTED Final   Serratia marcescens NOT DETECTED NOT DETECTED Final   Haemophilus influenzae NOT DETECTED NOT DETECTED Final   Neisseria meningitidis NOT DETECTED NOT DETECTED Final   Pseudomonas aeruginosa NOT DETECTED NOT DETECTED Final   Stenotrophomonas maltophilia NOT DETECTED NOT DETECTED Final   Candida albicans NOT DETECTED NOT DETECTED Final   Candida auris NOT DETECTED NOT DETECTED Final   Candida glabrata NOT DETECTED NOT DETECTED Final   Candida krusei NOT DETECTED NOT DETECTED Final   Candida parapsilosis NOT DETECTED NOT DETECTED Final   Candida tropicalis NOT DETECTED NOT DETECTED Final   Cryptococcus neoformans/gattii NOT DETECTED NOT DETECTED Final    Methicillin resistance mecA/C DETECTED (A) NOT DETECTED Final    Comment: CRITICAL RESULT CALLED TO, READ BACK BY AND VERIFIED WITH: T,JACKY PHARMD @0902  05/16/20 EB Performed at [facility], [address] [place], [place] [address]   MRSA PCR Screening     Status: None   Collection Time: 05/15/20  2:28 AM   Specimen: Nasopharyngeal  Result Value Ref Range Status   MRSA by PCR NEGATIVE NEGATIVE Final    Comment:        The GeneXpert MRSA Assay (FDA approved for NASAL specimens only), is one component of a comprehensive MRSA colonization surveillance program. It is not intended to diagnose MRSA infection nor to guide or monitor treatment for MRSA infections. Performed at [facility], [place] [address] [place], [place] [address]   Culture, blood (Routine x 2)     Status: None (Preliminary result)   Collection Time: 05/15/20  2:34 AM   Specimen: BLOOD RIGHT HAND  Result Value Ref Range Status   Specimen Description BLOOD RIGHT HAND  Final   Special Requests   Final    BOTTLES DRAWN AEROBIC AND ANAEROBIC Blood Culture results may not be optimal due to an inadequate volume of blood received in culture bottles   Culture   Final    NO GROWTH 2 DAYS Performed at [facility], [place] [address], [place], [place] [address]    Report Status PENDING  Incomplete  Resp Panel by RT-PCR (Flu A&B, Covid) Nasopharyngeal Swab     Status: None   Collection Time: 05/15/20  4:24 AM   Specimen: Nasopharyngeal Swab; Nasopharyngeal(NP) swabs in vial transport medium  Result Value Ref Range Status  SARS Coronavirus 2 by RT PCR NEGATIVE NEGATIVE Final    Comment: (NOTE) SARS-CoV-2 target nucleic acids are NOT DETECTED.  The SARS-CoV-2 RNA is generally detectable in upper respiratory specimens during the acute phase of infection. The lowest concentration of SARS-CoV-2 viral copies this assay can detect is 138 copies/mL. A negative result does not preclude SARS-Cov-2 infection  and should not be used as the sole basis for treatment or other patient management decisions. A negative result may occur with  improper specimen collection/handling, submission of specimen other than nasopharyngeal swab, presence of viral mutation(s) within the areas targeted by this assay, and inadequate number of viral copies(<138 copies/mL). A negative result must be combined with clinical observations, patient history, and epidemiological information. The expected result is Negative.  Fact Sheet for Patients:  EntrepreneurPulse.com.au  Fact Sheet for Healthcare Providers:  IncredibleEmployment.be  This test is no t yet approved or cleared by the [place] FDA and  has been authorized for detection and/or diagnosis of SARS-CoV-2 by FDA under an Emergency Use Authorization (EUA). This EUA will remain  in effect (meaning this test can be used) for the duration of the COVID-19 declaration under Section 564(b)(1) of the Act, 21 U.S.C.section 360bbb-3(b)(1), unless the authorization is terminated  or revoked sooner.       Influenza A by PCR NEGATIVE NEGATIVE Final   Influenza B by PCR NEGATIVE NEGATIVE Final    Comment: (NOTE) The Xpert Xpress SARS-CoV-2/FLU/RSV plus assay is intended as an aid in the diagnosis of influenza from Nasopharyngeal swab specimens and should not be used as a sole basis for treatment. Nasal washings and aspirates are unacceptable for Xpert Xpress SARS-CoV-2/FLU/RSV testing.  Fact Sheet for Patients: EntrepreneurPulse.com.au  Fact Sheet for Healthcare Providers: IncredibleEmployment.be  This test is not yet approved or cleared by the [place] FDA and has been authorized for detection and/or diagnosis of SARS-CoV-2 by FDA under an Emergency Use Authorization (EUA). This EUA will remain in effect (meaning this test can be used) for the duration of the COVID-19 declaration  under Section 564(b)(1) of the Act, 21 U.S.C. section 360bbb-3(b)(1), unless the authorization is terminated or revoked.  Performed at [facility], [place] [address] [place], [place] [address]      Studies: [place] EKG SITE RITE  Result Date: 05/17/2020 If [facility] image not attached, placement could not be confirmed due to current cardiac rhythm.     Flora Lipps, MD  [facility] 05/18/2020  If 7PM-7AM, please contact night-coverage

## 2020-05-19 DIAGNOSIS — I950 Idiopathic hypotension: Secondary | ICD-10-CM

## 2020-05-19 LAB — CBC
HCT: 35.1 % — ABNORMAL LOW (ref 36.0–46.0)
Hemoglobin: 12.1 g/dL (ref 12.0–15.0)
MCH: 32.1 pg (ref 26.0–34.0)
MCHC: 34.5 g/dL (ref 30.0–36.0)
MCV: 93.1 fL (ref 80.0–100.0)
Platelets: 67 10*3/uL — ABNORMAL LOW (ref 150–400)
RBC: 3.77 MIL/uL — ABNORMAL LOW (ref 3.87–5.11)
RDW: 19.9 % — ABNORMAL HIGH (ref 11.5–15.5)
WBC: 6 10*3/uL (ref 4.0–10.5)
nRBC: 1.8 % — ABNORMAL HIGH (ref 0.0–0.2)

## 2020-05-19 LAB — BASIC METABOLIC PANEL
Anion gap: 10 (ref 5–15)
BUN: 74 mg/dL — ABNORMAL HIGH (ref 8–23)
CO2: 20 mmol/L — ABNORMAL LOW (ref 22–32)
Calcium: 9.1 mg/dL (ref 8.9–10.3)
Chloride: 107 mmol/L (ref 98–111)
Creatinine, Ser: 4.08 mg/dL — ABNORMAL HIGH (ref 0.44–1.00)
GFR, Estimated: 11 mL/min — ABNORMAL LOW (ref >60)
Glucose, Bld: 114 mg/dL — ABNORMAL HIGH (ref 70–99)
Potassium: 4.1 mmol/L (ref 3.5–5.1)
Sodium: 137 mmol/L (ref 135–145)

## 2020-05-19 LAB — PHOSPHORUS: Phosphorus: 3.8 mg/dL (ref 2.5–4.6)

## 2020-05-19 LAB — COOXEMETRY PANEL
Carboxyhemoglobin: 1 % (ref 0.5–1.5)
Methemoglobin: 1.1 % (ref 0.0–1.5)
O2 Saturation: 72.3 %
Total hemoglobin: 11 g/dL — ABNORMAL LOW (ref 12.0–16.0)

## 2020-05-19 LAB — MAGNESIUM: Magnesium: 2 mg/dL (ref 1.7–2.4)

## 2020-05-19 MED ORDER — HEPARIN (PORCINE) 25000 UT/250ML-% IV SOLN
900.0000 [IU]/h | INTRAVENOUS | Status: DC
  Administered 2020-05-20: 1100 [IU]/h via INTRAVENOUS
  Filled 2020-05-19 (×2): qty 250

## 2020-05-19 MED ORDER — SODIUM CHLORIDE 0.9 % IV SOLN
12.5000 mg | Freq: Four times a day (QID) | INTRAVENOUS | Status: DC | PRN
  Filled 2020-05-19: qty 0.5

## 2020-05-19 MED ORDER — PROMETHAZINE HCL 25 MG/ML IJ SOLN: 12.5000 mg | Freq: Four times a day (QID) | INTRAMUSCULAR | Status: DC | PRN

## 2020-05-19 NOTE — Progress Notes (Signed)
ANTICOAGULATION CONSULT NOTE - Initial Consult  Pharmacy Consult for Heparin Indication: atrial flutter  Allergies  Allergen Reactions  . Lisinopril Cough    Patient Measurements: Height: 5\' 5"  (165.1 cm) Weight: 85.5 kg (188 lb 7.9 oz) IBW/kg (Calculated) : 57 Heparin Dosing Weight: 75 kg  Vital Signs: Temp: 97.7 F (36.5 C) (03/14 2002) Temp Source: Axillary (03/14 2002) BP: 107/66 (03/14 2248) Pulse Rate: 86 (03/14 2002)  Labs: Recent Labs    05/17/20 0102 05/18/20 0105 05/19/20 0023  HGB 11.2* 12.0 12.1  HCT 32.8* 35.6* 35.1*  PLT 66* 63* 67*  CREATININE 4.26* 4.27* 4.08*    Estimated Creatinine Clearance: 11.9 mL/min (A) (by C-G formula based on SCr of 4.08 mg/dL (H)).   Medical History: Past Medical History:  Diagnosis Date  . ANEMIA, NORMOCYTIC   . Arthritis    back  . BREAST CYST, HX OF   . CARPAL TUNNEL SYNDROME, BILATERAL   . DIABETES MELLITUS, TYPE II   . Glaucoma   . HYPERLIPIDEMIA   . HYPERTENSION   . OBESITY   . PERIPHERAL NEUROPATHY   . RENAL FAILURE, CHRONIC    Stage 4    Medications:  Scheduled:  . aspirin EC  81 mg Oral Daily  . atorvastatin  40 mg Oral Daily  . Chlorhexidine Gluconate Cloth  6 each Topical Daily  . furosemide  80 mg Intravenous BID  . heparin  5,000 Units Subcutaneous Q8H  . latanoprost  1 drop Both Eyes QHS  . sodium chloride flush  10-40 mL Intracatheter Q12H    Assessment: 80 y.o. female with new onset tachycardia/Aflutter for heparin  Goal of Therapy:  Heparin level 0.3-0.7 units/ml Monitor platelets by anticoagulation protocol: Yes   Plan:  D/C SQ heparin Start heparin 1100 units/hr Check heparin level in 8 hours.   Caryl Pina 05/19/2020,11:45 PM

## 2020-05-19 NOTE — Care Management Important Message (Signed)
Important Message  Patient Details  Name: Erin Liu MRN: [id number] Date of Birth: December 25, 1940   Medicare Important Message Given:  Yes     Orbie Pyo 05/19/2020, 4:05 PM

## 2020-05-19 NOTE — Progress Notes (Signed)
Physical Therapy Treatment Patient Details Name: Erin Liu MRN: [id number] DOB: 06-23-1940 Today's Date: 05/19/2020    History of Present Illness 80 y/o female presenting 3/9 after a fall at home and being unresponsive.  Pt with acute on chronic renal failure, CAP with sepsis and acute metabolic encephalopathy. PMHx: dementia, gout, neuropathy, DM, glaucoma, incontinence, tremor, HLD    PT Comments    Pt pleasantly confused with ability to stand with use of Stedy this session with improved stability once able to grasp bar. Pt with posterior left bias limiting sitting/standing balance and transfers. Pt with continued need for max +2 assist and noted family wanting to take pt home and will need to able to function at a max/total assist level. Will continue to follow acutely.     Follow Up Recommendations  SNF;Supervision/Assistance - 24 hour;Home health PT (family declining SNF and will require total assist)     Equipment Recommendations  Wheelchair (measurements PT);Hospital bed;3in1 (PT);Other (comment) (hoyer lift)    Recommendations for Other Services       Precautions / Restrictions Precautions Precautions: Fall Restrictions Weight Bearing Restrictions: No    Mobility  Bed Mobility Overal bed mobility: Needs Assistance Bed Mobility: Supine to Sit     Supine to sit: Max assist;+2 for physical assistance;HOB elevated     General bed mobility comments: HOB 30 degrees with physical assist of pad to pivot to right side of bed and max assist to elevate trunk with significant posterior bias    Transfers Overall transfer level: Needs assistance   Transfers: Sit to/from Stand;Stand Pivot Transfers Sit to Stand: Max assist;+2 physical assistance;Mod assist Stand pivot transfers: Total assist       General transfer comment: max +2 assist to stand from bed with pad cradling sacrum, use of stedy and slightly elevated bed with max cues. In [place] pt mod assist to stand  from pads x 2 with total assist to pivot with use of stedy for bed to chair.  Ambulation/Gait             General Gait Details: unable   Stairs             Wheelchair Mobility    Modified Rankin (Stroke Patients Only)       Balance Overall balance assessment: Needs assistance Sitting-balance support: No upper extremity supported;Feet supported Sitting balance-Leahy Scale: Zero Sitting balance - Comments: max assist due to posterior bias Postural control: Posterior lean Standing balance support: Bilateral upper extremity supported Standing balance-Leahy Scale: Poor Standing balance comment: bil UE support on stedy bar with pelvic cueing and support                            Cognition Arousal/Alertness: Awake/alert Behavior During Therapy: Flat affect Overall Cognitive Status: No family/caregiver present to determine baseline cognitive functioning Area of Impairment: Orientation;Attention;Memory;Following commands;Safety/judgement                 Orientation Level: Place;Time;Situation Current Attention Level: Sustained Memory: Decreased short-term memory Following Commands: Follows one step commands with increased time Safety/Judgement: Decreased awareness of safety;Decreased awareness of deficits Awareness: Intellectual          Exercises General Exercises - Lower Extremity Long Arc Quad: AAROM;Both;Seated;10 reps Hip Flexion/Marching: AAROM;Both;Seated;10 reps    General Comments        Pertinent Vitals/Pain Pain Assessment: No/denies pain    Home Living  Prior Function            PT Goals (current goals can now be found in the care plan section) Progress towards PT goals: Progressing toward goals    Frequency    Min 3X/week      PT Plan Current plan remains appropriate    Co-evaluation              AM-PAC PT "6 Clicks" Mobility   Outcome Measure  Help needed turning from  your back to your side while in a flat bed without using bedrails?: A Lot Help needed moving from lying on your back to sitting on the side of a flat bed without using bedrails?: Total Help needed moving to and from a bed to a chair (including a wheelchair)?: Total Help needed standing up from a chair using your arms (e.g., wheelchair or bedside chair)?: Total Help needed to walk in hospital room?: Total Help needed climbing 3-5 steps with a railing? : Total 6 Click Score: 7    End of Session Equipment Utilized During Treatment: Oxygen;Gait belt Activity Tolerance: Patient tolerated treatment well Patient left: in chair;with call bell/phone within reach;with nursing/sitter in room;with chair alarm set Nurse Communication: Need for lift equipment;Mobility status (maximove lift pad not available on unit and educated for lateral scoot +2 with drop arm recliner) PT Visit Diagnosis: Muscle weakness (generalized) (M62.81);Other abnormalities of gait and mobility (R26.89);Difficulty in walking, not elsewhere classified (R26.2)     Time: 3748-2707 PT Time Calculation (min) (ACUTE ONLY): 31 min  Charges:  $Therapeutic Exercise: 8-22 mins $Therapeutic Activity: 8-22 mins                     Erin Liu P, PT Acute Rehabilitation Services Pager: [phone] Office: [place] Mauricia Mertens 05/19/2020, 9:50 AM

## 2020-05-19 NOTE — Progress Notes (Signed)
PROGRESS NOTE  Erin Liu TIR:[id number] DOB: 11/04/1940 DOA: 05/14/2020 PCP: Binnie Rail, MD   LOS: 4 days   Brief narrative:  Erin Liu is a 80 y.o. female with medical history significant for history of anemia, diabetes mellitus, dementia, hyperlipidemia, hypertension, renal failure not on dialysis who presented to the EMS after a fall at home and being unresponsive. In the ED, patient was found to be hypothermic and hypotensive.  Patient was given IV fluid hydration and placed on a Retail banker.  CT of the head and neck were obtained and showed no acute pathology.  Patient was found to have elevated creatinine of 4.4 from her baseline of 2.6.  Patient was then admitted hospital for further evaluation and treatment.  During hospitalization, patient was noted to have extremely elevated troponin and cardiology was consulted.  However patient denied any chest pain.  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  Patient was noted to have a severe systolic cardiomyopathy and inotropes were initiated.  Assessment/Plan:  Principal Problem:   Renal failure (ARF), acute on chronic (HCC) Active Problems:   Hypotension   Hypothermia   CAP (community acquired pneumonia)   Acute metabolic encephalopathy   Sepsis ([place])   Fall at home, initial encounter   ARF (acute renal failure) (HCC)   Prolonged QT interval   Elevated lactic acid level   Cardiogenic shock (HCC)   Non-ST elevation (NSTEMI) myocardial infarction (HCC)  Mild Acute kidney injury on chronic kidney disease stage IV.     Renal ultrasound showed small kidneys mostly on the left with increased echogenicity.  Nephrology on board.    Fistula in place.  Not on dialysis.  Currently undergoing CHF management as inotropes.  Improving urinary output.  No plans for hemodialysis as per nephrology discussed with the family.  Lab Results  Component Value Date   CREATININE 4.08 (H) 05/19/2020   CREATININE 4.27 (H) 05/18/2020   CREATININE  4.26 (H) 67/61/9509    Acute systolic and diastolic heart failure with cardiogenic shock.   2D echocardiogram showed LV second fraction of 20 to 25% with elevated pulmonary artery systolic pressure.  Patient did have diminished urinary output so cardiology has started the patient on inotropes.   Urinary output has improved with milrinone drip.  Severe sepsis likely secondary to community-acquired pneumonia with acute hypoxic respiratory failure.  Continue Rocephin and doxycycline to complete a 5-day course.   On 4 L of oxygen. Blood cultures with staph epidermidis.    Urine analysis with large protein, leukocytes hemoglobin and WBC.  Urine culture without any growth so far.  Lactate improved with IV fluids and antibiotics.  Lactate could have been low from cardiogenic component as well.  Hypotension Secondary to systolic congestive heart failure. Continue to hold antihypertensives.  Cardiology on board currently on milrinone.  Significantly elevated troponin. On presentation.  Thought to be secondary to severe systolic dysfunction, sepsis, chronic kidney disease with AKI.    2D echocardiogram showed LV ejection fraction of 20 to 25% with global hypokinesis.  Continue aspirin, Lipitor.    Hypothermia On presentation.  Has resolved  Acute metabolic encephalopathy  Likely at baseline at this time likely secondary to acute kidney injury and sepsis.   CT of the head did not show any hemorrhage or acute stroke.   MRI of the brain was degraded study but most compatible with chronic small vessel disease.  Fall at home, initial encounter Continue precautions.    Will get PT evaluation when stable.  Prolonged QT interval Correct electrolytes.  Avoid QT prolonging agents.  DVT prophylaxis: heparin injection 5,000 Units Start: 05/15/20 0600  Code Status: Full code  Family Communication:  I spoke with the patient's daughter on the phone and updated her about the clinical condition  of the patient.  Patient's daughter Erin Liu is aware of the potential poor prognosis in this situation.  Status is: Inpatient  Remains inpatient appropriate because:IV treatments appropriate due to intensity of illness or inability to take PO and Inpatient level of care appropriate due to severity of illness, on inotropes.   Dispo: The patient is from: Home              Anticipated d/c is to: Likely with home as per the family.  PT recommends skilled nursing facility.             Patient currently is not medically stable to d/c.   Difficult to place patient No  Consultants:  Cardiology  Procedures:  None  Anti-infectives:  Marland Kitchen Rocephin and doxycycline 3/10>  Anti-infectives (From admission, onward)   Start     Dose/Rate Route Frequency Ordered Stop   05/15/20 2200  cefTRIAXone (ROCEPHIN) 1 g in sodium chloride 0.9 % 100 mL IVPB        1 g 200 mL/hr over 30 Minutes Intravenous Every 24 hours 05/15/20 0506 05/19/20 2359   05/15/20 0615  doxycycline (VIBRAMYCIN) 100 mg in sodium chloride 0.9 % 250 mL IVPB        100 mg 125 mL/hr over 120 Minutes Intravenous 2 times daily 05/15/20 0506 05/19/20 2359   05/15/20 0045  cefTRIAXone (ROCEPHIN) 1 g in sodium chloride 0.9 % 100 mL IVPB        1 g 200 mL/hr over 30 Minutes Intravenous  Once 05/15/20 0031 05/15/20 0136   05/15/20 0045  azithromycin (ZITHROMAX) 500 mg in sodium chloride 0.9 % 250 mL IVPB        500 mg 250 mL/hr over 60 Minutes Intravenous  Once 05/15/20 0031 05/15/20 0237     Subjective: Today, patient was seen and examined at bedside.  Denies any chest pain, dizziness has mild cough.  Objective: Vitals:   05/19/20 0743 05/19/20 0944  BP: (!) 110/54   Pulse: (!) 105 (!) 113  Resp: 20   Temp: (!) 97.3 F (36.3 C)   SpO2: 95% 93%    Intake/Output Summary (Last 24 hours) at 05/19/2020 1020 Last data filed at 05/19/2020 1000 Gross per 24 hour  Intake 390.32 ml  Output 1600 ml  Net -1209.68 ml   Filed Weights    05/15/20 2210 05/18/20 0500 05/19/20 0500  Weight: 81.3 kg 86.5 kg 85.5 kg   Body mass index is 31.37 kg/m.   Physical Exam: General: Obese built, not in obvious distress, on nasal cannula oxygen, alert awake and communicative, deconditioned HENT:   No scleral pallor or icterus noted. Oral mucosa is moist.  Chest:   Diminished breath sounds bilaterally.   CVS: S1 &S2 heard. No murmur.  Regular rate and rhythm. Abdomen: Soft, nontender, nondistended.  Bowel sounds are heard.   Extremities: No cyanosis, clubbing or edema.  Peripheral pulses are palpable.  Right upper extremity PICC line in place. Psych: Alert, awake and communicative, normal mood CNS:  No cranial nerve deficits.  Power equal in all extremities.   Skin: Warm and dry.  No rashes noted.  Data Review: I have personally reviewed the following laboratory data and studies,  CBC: Recent Labs  Lab 05/14/20 2317 05/14/20 2338 05/15/20 0736 05/16/20 0013 05/17/20 0102 05/18/20 0105 05/19/20 0023  WBC 6.1  --  5.2 5.4 5.8 6.7 6.0  NEUTROABS 2.7  --   --   --   --   --   --   HGB 12.6   < > 11.3* 11.5* 11.2* 12.0 12.1  HCT 40.2   < > 35.6* 35.1* 32.8* 35.6* 35.1*  MCV 102.0*  --  99.7 97.8 95.9 95.4 93.1  PLT 80*  --  66* 77* 66* 63* 67*   < > = values in this interval not displayed.   Basic Metabolic Panel: Recent Labs  Lab 05/15/20 0637 05/16/20 0013 05/17/20 0102 05/18/20 0105 05/19/20 0023  NA 137 135 136 136 137  K 5.2* 4.9 4.4 4.4 4.1  CL 103 106 108 108 107  CO2 20* 18* 18* 20* 20*  GLUCOSE 149* 130* 130* 117* 114*  BUN 75* 76* 75* 75* 74*  CREATININE 4.19* 4.20* 4.26* 4.27* 4.08*  CALCIUM 9.6 9.3 9.0 9.2 9.1  MG  --  2.1 1.9 2.1 2.0  PHOS  --  5.0* 4.5 4.4 3.8   Liver Function Tests: Recent Labs  Lab 05/14/20 2317 05/15/20 0637 05/16/20 0013  AST 99* 98* 109*  ALT 46* 48* 52*  ALKPHOS 102 90 97  BILITOT 1.8* 1.6* 1.3*  PROT 6.3* 6.0* 5.6*  ALBUMIN 3.4* 3.0* 3.0*   No results for  input(s): LIPASE, AMYLASE in the last 168 hours. No results for input(s): AMMONIA in the last 168 hours. Cardiac Enzymes: Recent Labs  Lab 05/14/20 2331  CKTOTAL 241*   BNP (last 3 results) No results for input(s): BNP in the last 8760 hours.  ProBNP (last 3 results) No results for input(s): PROBNP in the last 8760 hours.  CBG: Recent Labs  Lab 05/14/20 2317  GLUCAP 134*   Recent Results (from the past 240 hour(s))  Urine culture     Status: None   Collection Time: 05/14/20 11:18 PM   Specimen: Urine, Random  Result Value Ref Range Status   Specimen Description URINE, RANDOM  Final   Special Requests NONE  Final   Culture   Final    NO GROWTH Performed at [facility], [address] [place], [place] [address]    Report Status 05/16/2020 FINAL  Final  Culture, blood (Routine x 2)     Status: Abnormal   Collection Time: 05/14/20 11:27 PM   Specimen: BLOOD  Result Value Ref Range Status   Specimen Description BLOOD LEFT ANTECUBITAL  Final   Special Requests   Final    BOTTLES DRAWN AEROBIC AND ANAEROBIC Blood Culture adequate volume   Culture  Setup Time   Final    GRAM POSITIVE COCCI IN CLUSTERS AEROBIC BOTTLE ONLY CRITICAL RESULT CALLED TO, READ BACK BY AND VERIFIED WITH: T,JACKY PHARMD @0902  05/16/20 EB    Culture (A)  Final    STAPHYLOCOCCUS EPIDERMIDIS THE SIGNIFICANCE OF ISOLATING THIS ORGANISM FROM A SINGLE SET OF BLOOD CULTURES WHEN MULTIPLE SETS ARE DRAWN IS UNCERTAIN. PLEASE NOTIFY THE MICROBIOLOGY DEPARTMENT WITHIN ONE WEEK IF SPECIATION AND SENSITIVITIES ARE REQUIRED. Performed at [facility], [place] [address], [place], [place] [address]    Report Status 05/18/2020 FINAL  Final  Blood Culture ID Panel (Reflexed)     Status: Abnormal   Collection Time: 05/14/20 11:27 PM  Result Value Ref Range Status   Enterococcus faecalis NOT DETECTED NOT DETECTED Final   Enterococcus  Faecium NOT DETECTED NOT DETECTED Final   Listeria monocytogenes NOT  DETECTED NOT DETECTED Final   Staphylococcus species DETECTED (A) NOT DETECTED Final    Comment: CRITICAL RESULT CALLED TO, READ BACK BY AND VERIFIED WITH: T,JACKY PHARMD @0902  05/16/20 EB    Staphylococcus aureus (BCID) NOT DETECTED NOT DETECTED Final   Staphylococcus epidermidis DETECTED (A) NOT DETECTED Final    Comment: Methicillin (oxacillin) resistant coagulase negative staphylococcus. Possible blood culture contaminant (unless isolated from more than one blood culture draw or clinical case suggests pathogenicity). No antibiotic treatment is indicated for blood  culture contaminants. CRITICAL RESULT CALLED TO, READ BACK BY AND VERIFIED WITH: T,JACKY PHARMD @0902  05/16/20 EB    Staphylococcus lugdunensis NOT DETECTED NOT DETECTED Final   Streptococcus species NOT DETECTED NOT DETECTED Final   Streptococcus agalactiae NOT DETECTED NOT DETECTED Final   Streptococcus pneumoniae NOT DETECTED NOT DETECTED Final   Streptococcus pyogenes NOT DETECTED NOT DETECTED Final   A.calcoaceticus-baumannii NOT DETECTED NOT DETECTED Final   Bacteroides fragilis NOT DETECTED NOT DETECTED Final   Enterobacterales NOT DETECTED NOT DETECTED Final   Enterobacter cloacae complex NOT DETECTED NOT DETECTED Final   Escherichia coli NOT DETECTED NOT DETECTED Final   Klebsiella aerogenes NOT DETECTED NOT DETECTED Final   Klebsiella oxytoca NOT DETECTED NOT DETECTED Final   Klebsiella pneumoniae NOT DETECTED NOT DETECTED Final   Proteus species NOT DETECTED NOT DETECTED Final   Salmonella species NOT DETECTED NOT DETECTED Final   Serratia marcescens NOT DETECTED NOT DETECTED Final   Haemophilus influenzae NOT DETECTED NOT DETECTED Final   Neisseria meningitidis NOT DETECTED NOT DETECTED Final   Pseudomonas aeruginosa NOT DETECTED NOT DETECTED Final   Stenotrophomonas maltophilia NOT DETECTED NOT DETECTED Final   Candida albicans NOT DETECTED NOT DETECTED Final   Candida auris NOT DETECTED NOT DETECTED Final    Candida glabrata NOT DETECTED NOT DETECTED Final   Candida krusei NOT DETECTED NOT DETECTED Final   Candida parapsilosis NOT DETECTED NOT DETECTED Final   Candida tropicalis NOT DETECTED NOT DETECTED Final   Cryptococcus neoformans/gattii NOT DETECTED NOT DETECTED Final   Methicillin resistance mecA/C DETECTED (A) NOT DETECTED Final    Comment: CRITICAL RESULT CALLED TO, READ BACK BY AND VERIFIED WITH: T,JACKY PHARMD @0902  05/16/20 EB Performed at [facility], [address], [place], [place] [address]   MRSA PCR Screening     Status: None   Collection Time: 05/15/20  2:28 AM   Specimen: Nasopharyngeal  Result Value Ref Range Status   MRSA by PCR NEGATIVE NEGATIVE Final    Comment:        The GeneXpert MRSA Assay (FDA approved for NASAL specimens only), is one component of a comprehensive MRSA colonization surveillance program. It is not intended to diagnose MRSA infection nor to guide or monitor treatment for MRSA infections. Performed at [facility], [place] [address], [place], [place] [address]   Culture, blood (Routine x 2)     Status: None (Preliminary result)   Collection Time: 05/15/20  2:34 AM   Specimen: BLOOD RIGHT HAND  Result Value Ref Range Status   Specimen Description BLOOD RIGHT HAND  Final   Special Requests   Final    BOTTLES DRAWN AEROBIC AND ANAEROBIC Blood Culture results may not be optimal due to an inadequate volume of blood received in culture bottles   Culture   Final    NO GROWTH 4 DAYS Performed at [facility], [place]  [address] [place], [place] [address]    Report Status PENDING  Incomplete  Resp Panel by RT-PCR (Flu A&B, Covid) Nasopharyngeal Swab     Status: None   Collection Time: 05/15/20  4:24 AM   Specimen: Nasopharyngeal Swab; Nasopharyngeal(NP) swabs in vial transport medium  Result Value Ref Range Status   SARS Coronavirus 2 by RT PCR NEGATIVE NEGATIVE Final    Comment: (NOTE) SARS-CoV-2 target nucleic acids are NOT  DETECTED.  The SARS-CoV-2 RNA is generally detectable in upper respiratory specimens during the acute phase of infection. The lowest concentration of SARS-CoV-2 viral copies this assay can detect is 138 copies/mL. A negative result does not preclude SARS-Cov-2 infection and should not be used as the sole basis for treatment or other patient management decisions. A negative result may occur with  improper specimen collection/handling, submission of specimen other than nasopharyngeal swab, presence of viral mutation(s) within the areas targeted by this assay, and inadequate number of viral copies(<138 copies/mL). A negative result must be combined with clinical observations, patient history, and epidemiological information. The expected result is Negative.  Fact Sheet for Patients:  EntrepreneurPulse.com.au  Fact Sheet for Healthcare Providers:  IncredibleEmployment.be  This test is no t yet approved or cleared by the [place] FDA and  has been authorized for detection and/or diagnosis of SARS-CoV-2 by FDA under an Emergency Use Authorization (EUA). This EUA will remain  in effect (meaning this test can be used) for the duration of the COVID-19 declaration under Section 564(b)(1) of the Act, 21 U.S.C.section 360bbb-3(b)(1), unless the authorization is terminated  or revoked sooner.       Influenza A by PCR NEGATIVE NEGATIVE Final   Influenza B by PCR NEGATIVE NEGATIVE Final    Comment: (NOTE) The Xpert Xpress SARS-CoV-2/FLU/RSV plus assay is intended as an aid in the diagnosis of influenza from Nasopharyngeal swab specimens and should not be used as a sole basis for treatment. Nasal washings and aspirates are unacceptable for Xpert Xpress SARS-CoV-2/FLU/RSV testing.  Fact Sheet for Patients: EntrepreneurPulse.com.au  Fact Sheet for Healthcare Providers: IncredibleEmployment.be  This test is not yet  approved or cleared by the [place] FDA and has been authorized for detection and/or diagnosis of SARS-CoV-2 by FDA under an Emergency Use Authorization (EUA). This EUA will remain in effect (meaning this test can be used) for the duration of the COVID-19 declaration under Section 564(b)(1) of the Act, 21 U.S.C. section 360bbb-3(b)(1), unless the authorization is terminated or revoked.  Performed at [facility], [place] [address], [place], [place] [address]      Studies: [place] EKG SITE RITE  Result Date: 05/17/2020 If [facility] image not attached, placement could not be confirmed due to current cardiac rhythm.     Flora Lipps, MD  [facility] 05/19/2020  If 7PM-7AM, please contact night-coverage

## 2020-05-19 NOTE — TOC Initial Note (Signed)
Transition of Care [facility]) - Initial/Assessment Note    Patient Details  Name: Erin Liu MRN: [id number] Date of Birth: [birth year]  [facility] CM/SW Contact:    Bethann Berkshire, [place] Phone Number: 05/19/2020, 2:42 PM  Clinical Narrative:                  CSW called pt's daughter [name]. CSW discussed SNF recommendation. Daughter explains she is wanting pt to be enrolled in PACE program. Daughter is in process of enrolling pt which she states  may take until June 06, 2020 or Jul 06 2020. In the meantime, daughter wants pt to return to her home. Pt lives at home with pt's son who is there 24/7. Pt has DME including rollingwalker, rollator, bedside commode, tub chair, and wheelchair. Daughter is okay with [facility] and wants [facility]. Pt will need PTAR transport at time of discharge. TOC will arrange HH.    Expected Discharge Plan: [place] Barriers to Discharge: Continued Medical Work up   Patient Goals and CMS Choice Patient states their goals for this hospitalization and ongoing recovery are:: Pt not oriented. Daughter wants pt to go home with [facility] until she is established with PACE program CMS Medicare.gov Compare Post Acute Care list provided to:: Patient Represenative (must comment) (Daughter) Choice offered to / list presented to : Adult Children  Expected Discharge Plan and Services Expected Discharge Plan: [place] Choice: [place] arrangements for the past 2 months: Single Family Home                                      Prior Living Arrangements/Services Living arrangements for the past 2 months: Single Family Home Lives with:: Adult Children Patient language and need for interpreter reviewed:: Yes        Need for Family Participation in Patient Care: Yes (Comment) Care giver support system in place?: Yes (comment)   Criminal Activity/Legal Involvement Pertinent to Current  Situation/Hospitalization: No - Comment as needed  Activities of Daily Living      Permission Sought/Granted                  Emotional Assessment       Orientation: : Oriented to Self Alcohol / Substance Use: Not Applicable Psych Involvement: No (comment)  Admission diagnosis:  ARF (acute renal failure) ([place]) [N17.9] Acute renal failure ([place]) [N17.9] Patient Active Problem List   Diagnosis Date Noted  . Cardiogenic shock ([place])   . Non-ST elevation (NSTEMI) myocardial infarction ([place])   . Renal failure (ARF), acute on chronic (HCC) 05/15/2020  . Hypotension 05/15/2020  . Hypothermia 05/15/2020  . CAP (community acquired pneumonia) 05/15/2020  . Acute metabolic encephalopathy 19/62/2297  . Sepsis ([place]) 05/15/2020  . Fall at home, initial encounter 05/15/2020  . ARF (acute renal failure) ([place]) 05/15/2020  . Prolonged QT interval 05/15/2020  . Elevated lactic acid level 05/15/2020  . Demand ischemia ([place])   . Recurrent falls 04/28/2020  . Weight loss, unintentional 09/11/2019  . Rash 07/18/2019  . Memory disorder 07/18/2019  . Hand tingling 07/24/2018  . Physical deconditioning 01/13/2018  . Tremor of both hands 10/10/2017  . Urinary incontinence 10/10/2017  . Vasovagal syncope 10/02/2017  . Gout 07/12/2017  . B12 deficiency 04/11/2017  . Cough 08/26/2015  . Lumbago 02/25/2015  .  Essential tremor   . Glaucoma 01/08/2010  . Chronic kidney disease (CKD), stage IV (severe) ([place]) 12/10/2008  . DM (diabetes mellitus), secondary, uncontrolled, with renal complications ([place]) 13/24/4010  . Diabetic neuropathy ([place]) 02/22/2006  . Dyslipidemia 02/22/2006  . 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 CARPAL TUNNEL SYNDROME, BILATERAL 02/22/2006   PCP:  Binnie Rail, MD Pharmacy:   [facility], [place] [address] Phone: [phone] Fax: [phone]  [facility] [phone] - [place], [place] [address] [place] [address] Phone: [phone] Fax: [phone]     Social Determinants of Health (SDOH) Interventions    Readmission Risk Interventions No flowsheet data found.

## 2020-05-19 NOTE — Progress Notes (Signed)
Patient ID: Erin Liu, female   DOB: [birth year], 80 y.o.   MRN: [id number] [facility] Progress Note   Assessment/ Plan:   1. Acute kidney Injury on chronic kidney disease stage IV: Secondary to ischemic ATN from septic shock plus/minus cardiorenal mechanism from CHF exacerbation/cardiogenic shock.  Overnight with improving urine output and slight improvement of renal function with initiation of milrinone while on furosemide.  She does not have any acute electrolyte abnormality or significant uremic signs or symptoms at this time.  From my discussions with her/her daughter as an outpatient, decision made not to pursue dialysis for its negative impact on her QOL in the setting of multiple comorbidities. 2.  Cardiogenic shock with acute exacerbation of biventricular failure: Started on milrinone with ongoing furosemide-she has had corresponding improvement of urine output with some improvement of renal function seen on labs this morning.  Appreciate input by palliative care service. 3.  Community-acquired pneumonia with acute hypoxic respiratory failure/sepsis: On Rocephin/doxycycline and remains afebrile without any worsening shortness of breath. 4.  Acute encephalopathy: With history of baseline dementia likely worsened by recent hospitalization with sepsis.  Subjective:   Denies any acute events overnight-poor recall of events.   Objective:   BP (!) 110/54 (BP Location: Left Arm)   Pulse (!) 105   Temp (!) 97.3 F (36.3 C) (Oral)   Resp 20   Ht 5\' 5"  (1.651 m)   Wt 85.5 kg   SpO2 95%   BMI 31.37 kg/m   Intake/Output Summary (Last 24 hours) at 05/19/2020 0754 Last data filed at 05/19/2020 0300 Gross per 24 hour  Intake 372.02 ml  Output 1600 ml  Net -1227.98 ml   Weight change: -1 kg  Physical Exam: Gen: Comfortably resting in bed, awakens easily and requires redirection for orientation. CVS: Pulse regular tachycardia, S1 and S2 normal Resp: Clear to auscultation  bilaterally without any distinct rales or rhonchi Abd: Soft, obese, nontender Ext: Trace-1+ bilateral lower extremity edema.  Thrombosed left BBF.  Imaging: [place] EKG SITE RITE  Result Date: 05/17/2020 If Site Rite image not attached, placement could not be confirmed due to current cardiac rhythm.   Labs: BMET Recent Labs  Lab 05/14/20 2317 05/14/20 2338 05/15/20 0637 05/16/20 0013 05/17/20 0102 05/18/20 0105 05/19/20 0023  NA 136 146* 137 135 136 136 137  K 5.1 3.1* 5.2* 4.9 4.4 4.4 4.1  CL 103 120* 103 106 108 108 107  CO2 14*  --  20* 18* 18* 20* 20*  GLUCOSE 169* 85 149* 130* 130* 117* 114*  BUN 81* 65* 75* 76* 75* 75* 74*  CREATININE 4.41* 1.80* 4.19* 4.20* 4.26* 4.27* 4.08*  CALCIUM 9.8  --  9.6 9.3 9.0 9.2 9.1  PHOS  --   --   --  5.0* 4.5 4.4 3.8   CBC Recent Labs  Lab 05/14/20 2317 05/14/20 2338 05/16/20 0013 05/17/20 0102 05/18/20 0105 05/19/20 0023  WBC 6.1   < > 5.4 5.8 6.7 6.0  NEUTROABS 2.7  --   --   --   --   --   HGB 12.6   < > 11.5* 11.2* 12.0 12.1  HCT 40.2   < > 35.1* 32.8* 35.6* 35.1*  MCV 102.0*   < > 97.8 95.9 95.4 93.1  PLT 80*   < > 77* 66* 63* 67*   < > = values in this interval not displayed.   Medications:    . aspirin EC  81 mg Oral Daily  .  atorvastatin  40 mg Oral Daily  . Chlorhexidine Gluconate Cloth  6 each Topical Daily  . furosemide  80 mg Intravenous BID  . heparin  5,000 Units Subcutaneous Q8H  . latanoprost  1 drop Both Eyes QHS  . sodium chloride flush  10-40 mL Intracatheter Q12H   Elmarie Shiley, MD 05/19/2020, 7:54 AM

## 2020-05-19 NOTE — Progress Notes (Signed)
Progress Note  Patient Name: Erin Liu Date of Encounter: 05/19/2020  [facility] Cardiologist: Minus Breeding, MD   Subjective   Denies any chest pain.    Started on milrinone 0.25 with significant improvement in co-ox 378-->78.5-->72.3 today CVP is 10 today Cr stable at 4.27-->4.08. UOP picked up yesterday with net out 1600cc and net + 2.8 L from admit  Inpatient Medications    Scheduled Meds: . aspirin EC  81 mg Oral Daily  . atorvastatin  40 mg Oral Daily  . Chlorhexidine Gluconate Cloth  6 each Topical Daily  . furosemide  80 mg Intravenous BID  . heparin  5,000 Units Subcutaneous Q8H  . latanoprost  1 drop Both Eyes QHS  . sodium chloride flush  10-40 mL Intracatheter Q12H   Continuous Infusions: . cefTRIAXone (ROCEPHIN)  IV 1 g (05/18/20 2205)  . doxycycline (VIBRAMYCIN) IV 100 mg (05/18/20 2248)  . milrinone 0.25 mcg/kg/min (05/19/20 0800)   PRN Meds: acetaminophen **OR** acetaminophen, promethazine, senna-docusate, sodium chloride flush   Vital Signs    Vitals:   05/19/20 0000 05/19/20 0300 05/19/20 0500 05/19/20 0743  BP: 112/66 97/68  (!) 110/54  Pulse: (!) 110 (!) 109  (!) 105  Resp: 20 (!) 21  20  Temp:  97.6 F (36.4 C)  (!) 97.3 F (36.3 C)  TempSrc:  Oral  Oral  SpO2: 94% 95%  95%  Weight:   85.5 kg   Height:        Intake/Output Summary (Last 24 hours) at 05/19/2020 0851 Last data filed at 05/19/2020 0300 Gross per 24 hour  Intake 372.02 ml  Output 1600 ml  Net -1227.98 ml   Last 3 Weights 05/19/2020 05/18/2020 05/15/2020  Weight (lbs) 188 lb 7.9 oz 190 lb 11.2 oz 179 lb 3.7 oz  Weight (kg) 85.5 kg 86.5 kg 81.3 kg      Telemetry    Sinus tachycardia in low 100's with PVCs - Personally Reviewed  ECG    No new EKG to review - Personally Reviewed  Physical Exam   GEN: Frail appearing HEENT: Normal NECK: No JVD; No carotid bruits LYMPHATICS: No lymphadenopathy CARDIAC:RRR, no murmurs, rubs, gallops RESPIRATORY:  Clear  to auscultation without rales, wheezing or rhonchi  ABDOMEN: Soft, non-tender, non-distended MUSCULOSKELETAL:  No edema; No deformity  SKIN: Warm and dry NEUROLOGIC:  Alert and oriented x 3 PSYCHIATRIC:  Normal affect    Labs    High Sensitivity Troponin:   Recent Labs  Lab 05/15/20 0637 05/15/20 0922 05/15/20 1550  TROPONINIHS 7,635* 7,341* 8,905*      Chemistry Recent Labs  Lab 05/14/20 2317 05/14/20 2338 05/15/20 6568 05/16/20 0013 05/17/20 0102 05/18/20 0105 05/19/20 0023  NA 136   < > 137 135 136 136 137  K 5.1   < > 5.2* 4.9 4.4 4.4 4.1  CL 103   < > 103 106 108 108 107  CO2 14*  --  20* 18* 18* 20* 20*  GLUCOSE 169*   < > 149* 130* 130* 117* 114*  BUN 81*   < > 75* 76* 75* 75* 74*  CREATININE 4.41*   < > 4.19* 4.20* 4.26* 4.27* 4.08*  CALCIUM 9.8  --  9.6 9.3 9.0 9.2 9.1  PROT 6.3*  --  6.0* 5.6*  --   --   --   ALBUMIN 3.4*  --  3.0* 3.0*  --   --   --   AST 99*  --  98*  109*  --   --   --   ALT 46*  --  48* 52*  --   --   --   ALKPHOS 102  --  90 97  --   --   --   BILITOT 1.8*  --  1.6* 1.3*  --   --   --   GFRNONAA 10*  --  10* 10* 10* 10* 11*  ANIONGAP 19*  --  14 11 10 8 10    < > = values in this interval not displayed.     Hematology Recent Labs  Lab 05/17/20 0102 05/18/20 0105 05/19/20 0023  WBC 5.8 6.7 6.0  RBC 3.42* 3.73* 3.77*  HGB 11.2* 12.0 12.1  HCT 32.8* 35.6* 35.1*  MCV 95.9 95.4 93.1  MCH 32.7 32.2 32.1  MCHC 34.1 33.7 34.5  RDW 19.5* 19.6* 19.9*  PLT 66* 63* 67*    BNPNo results for input(s): BNP, PROBNP in the last 168 hours.   DDimer No results for input(s): DDIMER in the last 168 hours.   Radiology    [place] EKG SITE RITE  Result Date: 05/17/2020 If Site Rite image not attached, placement could not be confirmed due to current cardiac rhythm.   Cardiac Studies   Echo 05/16/20: 1. Left ventricular ejection fraction, by estimation, is 20 to 25%. The  left ventricle has severely decreased function. The left ventricle   demonstrates global hypokinesis. The left ventricular internal cavity size  was moderately dilated. Left  ventricular diastolic parameters are indeterminate. There is the  interventricular septum is flattened in diastole ('D' shaped left  ventricle), consistent with right ventricular volume overload.  2. Right ventricular systolic function is moderately reduced. The right  ventricular size is mildly enlarged. There is severely elevated pulmonary  artery systolic pressure. The estimated right ventricular systolic  pressure is 15.4 mmHg.  3. Left atrial size was moderately dilated.  4. Right atrial size was severely dilated.  5. The mitral valve is degenerative. Severe mitral valve regurgitation,  mechanism likely secondary MR due to LV dysfunction. No evidence of mitral  stenosis.  6. Tricuspid valve regurgitation is severe.  7. Small mobile strand on aortic aspect of left coronary cusp, not well  seen on prior study from 10/03/2017. The aortic valve is grossly normal.  Aortic valve regurgitation is trivial. No aortic stenosis is present.  8. The inferior vena cava is dilated in size with <50% respiratory  variability, suggesting right atrial pressure of 15 mmHg.    Patient Profile     80 y.o. female hx of diabetes, hypertension, LBBB, dementia, and CKD IVadmitted with pneumonia and fall. Cardiology consulted for elevated troponin. Hospitalization complicated by acute on chronic renal failure and aneuria  Assessment & Plan    # Acute systolic and diastolic heart failure: # Cardiogenic shock:  Echo 05/16/20 revealed severe biventricular heart failure.  LVEF 20 to 25% with moderately reduced right ventricular function.  PASP was 61.8 mmHg and right atrial pressure 15 mmHg. Likely contributing to her anuria.  Options discussed with the patient's daughter and Dr. Oval Linsey last week who agreed with palliative care consultation.  -In the interim, the decision was made to proceed with  stabilization with inotropes and diuresis.  -Now s/p PICC line placement and initiation of milrinone 0.25 with improvement in co-ox from 39-->78.5-->72.  -UOP has improved and SCr stabilized and now slightly improved today to 4.  -Will continue diuresis with lasix 80mg  BID and monitor response given CVP  10.  -Continue to trend CVP, co-ox, and UOP. -may consider increased dose of IV Lasix pending UOP today  # Elevated troponin: -Troponin is markedly elevated to nearly 9000.  -She has a chronic left bundle branch block.  -She continues to deny chest pain or pressure.  -No ischemic work up given her comorbid disease and will proceed with medical management.  -Continue aspirin 81 mg daily and statin -Will add GDMT as able once more clinically stable and off inotropic therapy  # Unresponsiveness/Fall:  Unclear if this was syncope. She is unable to stand for orthostatics.   # Acute on chronic renal failure:  # Aneuria: -Nephrology following.  -No plans for HD.  -Managing cardiogenic shock as above.  # CAP: # Septic shock: Antibiotics per primary team. Likely mixed septic/cardiogenic shock.   The patient is critically ill with multiple organ systems failure and requires high complexity decision making for assessment and support, frequent evaluation and titration of therapies, application of advanced monitoring technologies and extensive interpretation of multiple databases. Critical Care Time devoted to patient care services described in this note independent of APP time is 60 minutes with >50% of time spent in direct patient care.       For questions or updates, please contact [place] Please consult [email or web address] for contact info under        Signed, Fransico Him, MD  05/19/2020, 8:51 AM

## 2020-05-19 NOTE — Significant Event (Signed)
Rapid Response Event Note   Reason for Call :  Wide-complex tachcardia, 140s  Initial Focused Assessment:  Pt laying in bed with eyes closed, in no distress. Pt is alert to self, denies chest pain/SOB.  Lungs clear, diminished in bases. Skin warm and dry.   HR-140, BP-107/66, RR-20, SpO2-100% on 3L [place].    Interventions:  EKG-first showed wide-complex tachycardia; second showed A-flutter 2:1  Plan of Care:  Pt in A-flutter with rapid rate, BP currently stable. RN paging PCP and cardiology. Await orders and continue to monitor closely. Call RRT if further assistance needed.   Event Summary:   MD Notified: Bedside RN to notify Call Time:2240 Arrival [phone] End Time:2300  Dillard Essex, RN

## 2020-05-20 DIAGNOSIS — N170 Acute kidney failure with tubular necrosis: Secondary | ICD-10-CM

## 2020-05-20 DIAGNOSIS — I5023 Acute on chronic systolic (congestive) heart failure: Secondary | ICD-10-CM

## 2020-05-20 DIAGNOSIS — I4892 Unspecified atrial flutter: Secondary | ICD-10-CM

## 2020-05-20 LAB — BASIC METABOLIC PANEL
Anion gap: 9 (ref 5–15)
BUN: 77 mg/dL — ABNORMAL HIGH (ref 8–23)
CO2: 21 mmol/L — ABNORMAL LOW (ref 22–32)
Calcium: 9 mg/dL (ref 8.9–10.3)
Chloride: 106 mmol/L (ref 98–111)
Creatinine, Ser: 3.8 mg/dL — ABNORMAL HIGH (ref 0.44–1.00)
GFR, Estimated: 11 mL/min — ABNORMAL LOW (ref >60)
Glucose, Bld: 115 mg/dL — ABNORMAL HIGH (ref 70–99)
Potassium: 3.7 mmol/L (ref 3.5–5.1)
Sodium: 136 mmol/L (ref 135–145)

## 2020-05-20 LAB — CBC
HCT: 31.1 % — ABNORMAL LOW (ref 36.0–46.0)
Hemoglobin: 10.9 g/dL — ABNORMAL LOW (ref 12.0–15.0)
MCH: 32.2 pg (ref 26.0–34.0)
MCHC: 35 g/dL (ref 30.0–36.0)
MCV: 92 fL (ref 80.0–100.0)
Platelets: 61 10*3/uL — ABNORMAL LOW (ref 150–400)
RBC: 3.38 MIL/uL — ABNORMAL LOW (ref 3.87–5.11)
RDW: 19.7 % — ABNORMAL HIGH (ref 11.5–15.5)
WBC: 7.6 10*3/uL (ref 4.0–10.5)
nRBC: 1.6 % — ABNORMAL HIGH (ref 0.0–0.2)

## 2020-05-20 LAB — HEPARIN LEVEL (UNFRACTIONATED)
Heparin Unfractionated: 0.92 IU/mL — ABNORMAL HIGH (ref 0.30–0.70)
Heparin Unfractionated: 1.08 IU/mL — ABNORMAL HIGH (ref 0.30–0.70)

## 2020-05-20 LAB — COOXEMETRY PANEL
Carboxyhemoglobin: 0.8 % (ref 0.5–1.5)
Methemoglobin: 1.1 % (ref 0.0–1.5)
O2 Saturation: 73.9 %
Total hemoglobin: 11.4 g/dL — ABNORMAL LOW (ref 12.0–16.0)

## 2020-05-20 LAB — CULTURE, BLOOD (ROUTINE X 2): Culture: NO GROWTH

## 2020-05-20 LAB — MAGNESIUM: Magnesium: 1.7 mg/dL (ref 1.7–2.4)

## 2020-05-20 LAB — PHOSPHORUS: Phosphorus: 3.6 mg/dL (ref 2.5–4.6)

## 2020-05-20 NOTE — Plan of Care (Signed)

## 2020-05-20 NOTE — TOC Progression Note (Signed)
Transition of Care [facility]) - Progression Note    Patient Details  Name: Erin Liu MRN: [id number] Date of Birth: 10/21/1940  Transition of Care [facility]) CM/SW Contact  Zenon Mayo, RN Phone Number: 05/20/2020, 9:58 AM  Clinical Narrative:    NCM made referral to [facility] with [name] for [place], [place], SW.  Tommi Rumps was able to take referral soc will begin 24 to 48 hrs post dc.  Will need orders.    Expected Discharge Plan: [place] Barriers to Discharge: Continued Medical Work up  Expected Discharge Plan and Services Expected Discharge Plan: [place] Choice: [place] arrangements for the past 2 months: Single Family Home                                       Social Determinants of Health (SDOH) Interventions    Readmission Risk Interventions No flowsheet data found.

## 2020-05-20 NOTE — Progress Notes (Signed)
[place] for Heparin Indication: atrial flutter  Allergies  Allergen Reactions  . Lisinopril Cough    Patient Measurements: Height: 5\' 5"  (165.1 cm) Weight: 83.5 kg (184 lb 1.4 oz) IBW/kg (Calculated) : 57 Heparin Dosing Weight: 75 kg  Vital Signs: Temp: 97.6 F (36.4 C) (03/15 0755) Temp Source: Oral (03/15 0755) BP: 115/77 (03/15 0906) Pulse Rate: 106 (03/15 0906)  Labs: Recent Labs    05/18/20 0105 05/19/20 0023 05/20/20 0435 05/20/20 0804  HGB 12.0 12.1 10.9*  --   HCT 35.6* 35.1* 31.1*  --   PLT 63* 67* 61*  --   HEPARINUNFRC  --   --   --  0.92*  CREATININE 4.27* 4.08* 3.80*  --     Estimated Creatinine Clearance: 12.6 mL/min (A) (by C-G formula based on SCr of 3.8 mg/dL (H)).   Medical History: Past Medical History:  Diagnosis Date  . ANEMIA, NORMOCYTIC   . Arthritis    back  . BREAST CYST, HX OF   . CARPAL TUNNEL SYNDROME, BILATERAL   . DIABETES MELLITUS, TYPE II   . Glaucoma   . HYPERLIPIDEMIA   . HYPERTENSION   . OBESITY   . PERIPHERAL NEUROPATHY   . RENAL FAILURE, CHRONIC    Stage 4    Medications:  Scheduled:  . aspirin EC  81 mg Oral Daily  . atorvastatin  40 mg Oral Daily  . Chlorhexidine Gluconate Cloth  6 each Topical Daily  . furosemide  80 mg Intravenous BID  . latanoprost  1 drop Both Eyes QHS  . sodium chloride flush  10-40 mL Intracatheter Q12H    Assessment: 80 y.o. female with new onset tachycardia/Aflutter for heparin.   Initial heparin level above goal at 0.92 on 1100 units/hr. Hgb down overnight from 12.1>10.9. Platelet count low but stable 60s. No bleeding issues noted.  Goal of Therapy:  Heparin level 0.3-0.7 units/ml Monitor platelets by anticoagulation protocol: Yes   Plan:  Decrease heparin to 900 units/hr Check heparin level in 8 hours.   Erin Hearing PharmD., BCPS Clinical Pharmacist 05/20/2020 9:29 AM

## 2020-05-20 NOTE — TOC Progression Note (Signed)
Transition of Care [facility]) - Progression Note    Patient Details  Name: Erin Liu MRN: [id number] Date of Birth: [birth year]  [facility] CM/SW [place], [place] Phone Number: 05/20/2020, 4:16 PM  Clinical Narrative:     CSW called pt daughter and confirmed she is okay with OP Palliative referral to [place]. CSW made OP Palliative referral to [place] at [facility]   Expected Discharge Plan: [place] Barriers to Discharge: Continued Medical Work up  Expected Discharge Plan and Services Expected Discharge Plan: [place] Choice: [place] arrangements for the past 2 months: [place]: RN,PT,OT,Social Work [employer] Agency: [place] Date [place]: 05/20/20 Time Welcome: 49 Representative spoke with at [place]: [place] ([place]) Interventions    Readmission Risk Interventions No flowsheet data found.

## 2020-05-20 NOTE — Progress Notes (Signed)
Patient ID: Erin Liu, female   DOB: Jun 02, 1940, 80 y.o.   MRN: [id number] [facility] Progress Note   Assessment/ Plan:   1. Acute kidney Injury on chronic kidney disease stage IV: Secondary to ischemic ATN from septic shock plus/minus cardiorenal mechanism from CHF exacerbation/cardiogenic shock.  She continues to maintain decent urine output with ongoing milrinone and furosemide overnight and labs indicative of improving renal function this morning.  From my discussions with her/her daughter as an outpatient, decision made not to pursue dialysis for its negative impact on her QOL in the setting of multiple comorbidities. 2.  Cardiogenic shock with acute exacerbation of biventricular failure: On milrinone and furosemide with fair urine output and improving renal function noted on labs.  Appreciate input by palliative care service. 3.  Community-acquired pneumonia with acute hypoxic respiratory failure/sepsis: On Rocephin/doxycycline and remains afebrile without any worsening shortness of breath. 4.  Acute encephalopathy: With history of baseline dementia likely worsened by recent hospitalization with sepsis.  Subjective:   Reports no problems overnight.  I spoke to her daughter yesterday afternoon to update her on her mother's condition.   Objective:   BP 127/83 (BP Location: Left Arm)   Pulse (!) 117   Temp 97.6 F (36.4 C) (Oral)   Resp 20   Ht 5\' 5"  (1.651 m)   Wt 83.5 kg   SpO2 100%   BMI 30.63 kg/m   Intake/Output Summary (Last 24 hours) at 05/20/2020 2706 Last data filed at 05/20/2020 0500 Gross per 24 hour  Intake 1960.92 ml  Output 2300 ml  Net -339.08 ml   Weight change: -2 kg  Physical Exam: Gen: Sleeping comfortably in bed, awakens with some difficulty and requires reorientation. CVS: Pulse regular tachycardia, S1 and S2 normal Resp: Clear to auscultation bilaterally without any distinct rales or rhonchi Abd: Soft, obese, nontender Ext: Trace-1+  bilateral lower extremity edema.  Thrombosed left BBF.  Imaging: No results found.  Labs: BMET Recent Labs  Lab 05/14/20 2317 05/14/20 2338 05/15/20 2376 05/16/20 0013 05/17/20 0102 05/18/20 0105 05/19/20 0023 05/20/20 0435  NA 136 146* 137 135 136 136 137 136  K 5.1 3.1* 5.2* 4.9 4.4 4.4 4.1 3.7  CL 103 120* 103 106 108 108 107 106  CO2 14*  --  20* 18* 18* 20* 20* 21*  GLUCOSE 169* 85 149* 130* 130* 117* 114* 115*  BUN 81* 65* 75* 76* 75* 75* 74* 77*  CREATININE 4.41* 1.80* 4.19* 4.20* 4.26* 4.27* 4.08* 3.80*  CALCIUM 9.8  --  9.6 9.3 9.0 9.2 9.1 9.0  PHOS  --   --   --  5.0* 4.5 4.4 3.8 3.6   CBC Recent Labs  Lab 05/14/20 2317 05/14/20 2338 05/17/20 0102 05/18/20 0105 05/19/20 0023 05/20/20 0435  WBC 6.1   < > 5.8 6.7 6.0 7.6  NEUTROABS 2.7  --   --   --   --   --   HGB 12.6   < > 11.2* 12.0 12.1 10.9*  HCT 40.2   < > 32.8* 35.6* 35.1* 31.1*  MCV 102.0*   < > 95.9 95.4 93.1 92.0  PLT 80*   < > 66* 63* 67* 61*   < > = values in this interval not displayed.   Medications:    . aspirin EC  81 mg Oral Daily  . atorvastatin  40 mg Oral Daily  . Chlorhexidine Gluconate Cloth  6 each Topical Daily  . furosemide  80 mg Intravenous  BID  . latanoprost  1 drop Both Eyes QHS  . sodium chloride flush  10-40 mL Intracatheter Q12H   Elmarie Shiley, MD 05/20/2020, 8:07 AM

## 2020-05-20 NOTE — Progress Notes (Addendum)
Progress Note  Patient Name: Erin Liu Date of Encounter: 05/20/2020  [facility] Cardiologist: Minus Breeding, MD   Subjective   No chest pain or SOB. Had wide complex tachycardia last night with EKG c/w aflutter with RVR and 2:1 conduction and aberration.  Currently in NSR. Started on IV Heparin gtt Currently on milrinone 0.25 with significant improvement in co-ox 378-->78.5-->72.3.-->73.9 today CVP is 7 today Cr stable at 4.27-->4.08-->3.08. UOP picked up yesterday with net out 2300cc and net + 2.5 L from admit  Inpatient Medications    Scheduled Meds: . aspirin EC  81 mg Oral Daily  . atorvastatin  40 mg Oral Daily  . Chlorhexidine Gluconate Cloth  6 each Topical Daily  . furosemide  80 mg Intravenous BID  . latanoprost  1 drop Both Eyes QHS  . sodium chloride flush  10-40 mL Intracatheter Q12H   Continuous Infusions: . heparin 1,100 Units/hr (05/20/20 0500)  . milrinone 0.125 mcg/kg/min (05/20/20 1245)  . promethazine (PHENERGAN) injection     PRN Meds: acetaminophen **OR** acetaminophen, promethazine (PHENERGAN) injection, senna-docusate, sodium chloride flush   Vital Signs    Vitals:   05/20/20 0300 05/20/20 0510 05/20/20 0755 05/20/20 0900  BP: 104/69  127/83 96/66  Pulse: (!) 102  (!) 117 (!) 105  Resp: 16 16 20 15   Temp: 97.6 F (36.4 C)  97.6 F (36.4 C)   TempSrc: Oral  Oral   SpO2: 100% 100% 100% 100%  Weight: 83.5 kg     Height:        Intake/Output Summary (Last 24 hours) at 05/20/2020 0905 Last data filed at 05/20/2020 0500 Gross per 24 hour  Intake 1954.82 ml  Output 2300 ml  Net -345.18 ml   Last 3 Weights 05/20/2020 05/19/2020 05/18/2020  Weight (lbs) 184 lb 1.4 oz 188 lb 7.9 oz 190 lb 11.2 oz  Weight (kg) 83.5 kg 85.5 kg 86.5 kg      Telemetry    Sinus tachycardia with occasional PVCs - Personally Reviewed  ECG    No new EKG to review - Personally Reviewed  Physical Exam   GEN: Well nourished, well developed in no acute  distress HEENT: Normal NECK: No JVD; No carotid bruits LYMPHATICS: No lymphadenopathy CARDIAC:RRR, no murmurs, rubs, gallops RESPIRATORY:  Clear to auscultation without rales, wheezing or rhonchi  ABDOMEN: Soft, non-tender, non-distended MUSCULOSKELETAL:  2+ pitting LE edema; No deformity  SKIN: Warm and dry NEUROLOGIC:  Alert and oriented x 3 PSYCHIATRIC:  Normal affect    Labs    High Sensitivity Troponin:   Recent Labs  Lab 05/15/20 0637 05/15/20 0922 05/15/20 1550  TROPONINIHS 7,635* 7,341* 8,905*      Chemistry Recent Labs  Lab 05/14/20 2317 05/14/20 2338 05/15/20 8099 05/16/20 0013 05/17/20 0102 05/18/20 0105 05/19/20 0023 05/20/20 0435  NA 136   < > 137 135   < > 136 137 136  K 5.1   < > 5.2* 4.9   < > 4.4 4.1 3.7  CL 103   < > 103 106   < > 108 107 106  CO2 14*  --  20* 18*   < > 20* 20* 21*  GLUCOSE 169*   < > 149* 130*   < > 117* 114* 115*  BUN 81*   < > 75* 76*   < > 75* 74* 77*  CREATININE 4.41*   < > 4.19* 4.20*   < > 4.27* 4.08* 3.80*  CALCIUM 9.8  --  9.6 9.3   < > 9.2 9.1 9.0  PROT 6.3*  --  6.0* 5.6*  --   --   --   --   ALBUMIN 3.4*  --  3.0* 3.0*  --   --   --   --   AST 99*  --  98* 109*  --   --   --   --   ALT 46*  --  48* 52*  --   --   --   --   ALKPHOS 102  --  90 97  --   --   --   --   BILITOT 1.8*  --  1.6* 1.3*  --   --   --   --   GFRNONAA 10*  --  10* 10*   < > 10* 11* 11*  ANIONGAP 19*  --  14 11   < > 8 10 9    < > = values in this interval not displayed.     Hematology Recent Labs  Lab 05/18/20 0105 05/19/20 0023 05/20/20 0435  WBC 6.7 6.0 7.6  RBC 3.73* 3.77* 3.38*  HGB 12.0 12.1 10.9*  HCT 35.6* 35.1* 31.1*  MCV 95.4 93.1 92.0  MCH 32.2 32.1 32.2  MCHC 33.7 34.5 35.0  RDW 19.6* 19.9* 19.7*  PLT 63* 67* 61*    BNPNo results for input(s): BNP, PROBNP in the last 168 hours.   DDimer No results for input(s): DDIMER in the last 168 hours.   Radiology    No results found.  Cardiac Studies   Echo 05/16/20: 1.  Left ventricular ejection fraction, by estimation, is 20 to 25%. The  left ventricle has severely decreased function. The left ventricle  demonstrates global hypokinesis. The left ventricular internal cavity size  was moderately dilated. Left  ventricular diastolic parameters are indeterminate. There is the  interventricular septum is flattened in diastole ('D' shaped left  ventricle), consistent with right ventricular volume overload.  2. Right ventricular systolic function is moderately reduced. The right  ventricular size is mildly enlarged. There is severely elevated pulmonary  artery systolic pressure. The estimated right ventricular systolic  pressure is 03.4 mmHg.  3. Left atrial size was moderately dilated.  4. Right atrial size was severely dilated.  5. The mitral valve is degenerative. Severe mitral valve regurgitation,  mechanism likely secondary MR due to LV dysfunction. No evidence of mitral  stenosis.  6. Tricuspid valve regurgitation is severe.  7. Small mobile strand on aortic aspect of left coronary cusp, not well  seen on prior study from 10/03/2017. The aortic valve is grossly normal.  Aortic valve regurgitation is trivial. No aortic stenosis is present.  8. The inferior vena cava is dilated in size with <50% respiratory  variability, suggesting right atrial pressure of 15 mmHg.    Patient Profile     80 y.o. female hx of diabetes, hypertension, LBBB, dementia, and CKD IVadmitted with pneumonia and fall. Cardiology consulted for elevated troponin. Hospitalization complicated by acute on chronic renal failure and aneuria  Assessment & Plan    # Acute systolic and diastolic heart failure: # Cardiogenic shock:  Echo 05/16/20 revealed severe biventricular heart failure.  LVEF 20 to 25% with moderately reduced right ventricular function.  PASP was 61.8 mmHg and right atrial pressure 15 mmHg. Likely contributing to her anuria.  Options discussed with the patient's  daughter and Dr. Oval Linsey last week who agreed with palliative care consultation.  -In the interim, the decision  was made to proceed with stabilization with inotropes and diuresis.  -Now s/p PICC line placement and initiation of milrinone 0.25 with improvement in co-ox from 39-->78.5-->72-->73.9.  -UOP has improved and SCr stabilized and continues to improve and down to 3.8 today -Will continue diuresis with lasix 80mg  BID >>CVP is 7 today -Continue to trend CVP, co-ox, and UOP. -CVP continues to trend down -still has LE edema>>add compression hose -will keep on IV Lasix and Milrinone one more day -I do not think she will tolerate GDMT for HF given her poor hemodynamics -Recommend Palliative Care talk with family again about goals of care and code status  # Elevated troponin: -Troponin is markedly elevated to nearly 9000.  -She has a chronic left bundle branch block.  -She continues to deny chest pain or pressure.  -No ischemic work up given her comorbid disease and will proceed with medical management.  -Continue aspirin 81 mg daily and statin -Will add GDMT as able once more clinically stable and off inotropic therapy  # Unresponsiveness/Fall:  Unclear if this was syncope. She is unable to stand for orthostatics.   # Acute on chronic renal failure:  # Aneuria: -Nephrology following. Baseline SCr 2.6 -No plans for HD.  -Managing cardiogenic shock as above.  # CAP: # Septic shock: Antibiotics per primary team. Likely mixed septic/cardiogenic shock.  #Atrial Flutter with RVR and 2:1 conduction -now back in NSR -on IV Heparin for now -need to discuss plans going forward with family in regards to if they want to pursue anticoagulation since she is going home with Palliative Care -BP will not support addition of BB  -consider amio if she has any further episodes    I have spent a total of 35 minutes with patient reviewing 2D echo , telemetry, EKGs, labs and examining  patient as well as establishing an assessment and plan that was discussed with the patient.  > 50% of time was spent in direct patient care.    For questions or updates, please contact [place] Please consult [email or web address] for contact info under        Signed, Fransico Him, MD  05/20/2020, 9:05 AM

## 2020-05-20 NOTE — Progress Notes (Signed)
PROGRESS NOTE  KATHARINA JEHLE BMW:[id number] DOB: 15-Nov-1940 DOA: 05/14/2020 PCP: Binnie Rail, MD   LOS: 5 days   Brief narrative:  ZAAKIRAH KISTNER is a 80 y.o. female with medical history significant for history of anemia, diabetes mellitus, dementia, hyperlipidemia, hypertension, renal failure not on dialysis who presented to the EMS after a fall at home and being unresponsive. In the ED, patient was found to be hypothermic and hypotensive.  Patient was given IV fluid hydration and placed on a Retail banker.  CT of the head and neck were obtained and showed no acute pathology.  Patient was found to have elevated creatinine of 4.4 from her baseline of 2.6.  Patient was then admitted hospital for further evaluation and treatment.  During hospitalization, patient was noted to have extremely elevated troponin and cardiology was consulted.  However patient denied any chest pain.  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  Patient was noted to have severe systolic cardiomyopathy and inotropes were initiated. Palliative care followed the patient during hospitalization.  Family requesting home with home health initially followed by [facility] program and possible palliative care as outpatient.  Assessment/Plan:  Principal Problem:   Renal failure (ARF), acute on chronic (HCC) Active Problems:   Hypotension   Hypothermia   CAP (community acquired pneumonia)   Acute metabolic encephalopathy   Sepsis ([place])   Fall at home, initial encounter   ARF (acute renal failure) (HCC)   Prolonged QT interval   Elevated lactic acid level   Cardiogenic shock (HCC)   Non-ST elevation (NSTEMI) myocardial infarction (HCC)  Mild Acute kidney injury on chronic kidney disease stage IV.   Thought to be secondary to ischemic ATN from septic shock/cardiogenic etiology.  Renal ultrasound showed small kidneys mostly on the left with increased echogenicity.  Nephrology on board.    Fistula in place.  Not on dialysis.  Currently  undergoing CHF management with inotropes.    No plans for hemodialysis as per nephrology after  discussion with the patient's family.  Slightly improved creatinine levels.  Urinary output better on milrinone drip.  Lab Results  Component Value Date   CREATININE 3.80 (H) 05/20/2020   CREATININE 4.08 (H) 05/19/2020   CREATININE 4.27 (H) 27/25/3664    Acute systolic and diastolic heart failure with cardiogenic shock.   2D echocardiogram showed LV second fraction of 20 to 25% with elevated pulmonary artery systolic pressure.  Currently on milrinone drip.  Cardiology actively following.  On IV Lasix 80 twice daily.  Cardiology recommends 1 more day of Lasix and milrinone  Severe sepsis likely secondary to community-acquired pneumonia with acute hypoxic respiratory failure. Completed Rocephin and doxycycline 5-day course.  On 4 L of oxygen. Blood cultures with staph epidermidis likely contaminant..   Culture was negative..  Initially had high lactate was improved could have been component of sepsis and cardiogenic shock.  She is currently on room air.  Wide-complex tachycardia likely secondary to atrial flutter with 2: 1 conduction and aberration.  Cardiology on board.  On heparin drip at this time.  Will discuss with family regarding anticoagulation in the future.  Unable to add beta-blockers due to her condition  Hypotension Secondary to systolic congestive heart failure. Continue to hold antihypertensives.  Cardiology on board currently on milrinone.  Significantly elevated troponin. On presentation.  Thought to be secondary to severe systolic dysfunction, sepsis, chronic kidney disease with AKI.    2D echocardiogram showed LV ejection fraction of 20 to 25% with global hypokinesis.  Continue aspirin, Lipitor.  On heparin drip as per cardiology.  Hypothermia On presentation.  Has resolved  Acute metabolic encephalopathy   likely secondary to acute kidney injury and sepsis.     Likely at  baseline at this time.  CT of the head did not show any hemorrhage or acute stroke.   MRI of the brain was degraded study but most compatible with chronic small vessel disease.  Fall at home, initial encounter Continue precautions.    Recommended a skilled nursing facility placement but family wishes home with home health followed by pace program.  Prolonged QT interval Correct electrolytes.  Avoid QT prolonging agents.  Goals of care. Palliative care was consulted due to heart failure hypotension, cardiogenic shock, poor perfusion with CKD and sepsis.  Family wished to have palliative care as outpatient.  Continue full code.  DVT prophylaxis: Place TED hose Start: 05/20/20 0920  Code Status: Full code  Family Communication:   I spoke with the patient's daughter on the phone and updated her about the clinical condition of the patient again today.  I spoke about poor prognosis of the patient and strongly advised her to think about the palliative care/hospice at home.  She expressed that she wishes palliative care to come to her house tomorrow and discuss with her tomorrow.  Status is: Inpatient  Remains inpatient appropriate because:IV treatments appropriate due to intensity of illness or inability to take PO and Inpatient level of care appropriate due to severity of illness, on inotropes.   Dispo: The patient is from: Home              Anticipated d/c is to: Likely with home as per the family.  PT recommends skilled nursing facility.      likely home with palliative care tomorrow.         Patient currently is not medically stable to d/c.   Difficult to place patient No  Consultants:  Cardiology  Procedures:  None  Anti-infectives:   Rocephin and doxycycline 3/10>3/14  Subjective: Today, patient was seen and examined at bedside.  Denies any chest pain, nausea, vomiting, shortness of breath.  Patient had wide-complex tachycardia last night consistent with atrial flutter  with RVR into his 2 1 conduction and aberration.  Drip was initiated.  Objective: Vitals:   05/20/20 1100 05/20/20 1121  BP: (!) 110/96 112/73  Pulse: (!) 119 (!) 112  Resp: 17 17  Temp:  97.6 F (36.4 C)  SpO2:  100%    Intake/Output Summary (Last 24 hours) at 05/20/2020 1452 Last data filed at 05/20/2020 1400 Gross per 24 hour  Intake 1741.57 ml  Output 2600 ml  Net -858.43 ml   Filed Weights   05/18/20 0500 05/19/20 0500 05/20/20 0300  Weight: 86.5 kg 85.5 kg 83.5 kg   Body mass index is 30.63 kg/m.   Physical Exam:  General: Obese built, not in obvious distress, on nasal cannula oxygen, debilitated and deconditioned, alert awake and mildly communicative, elderly female HENT:   No scleral pallor or icterus noted. Oral mucosa is moist.  Chest:   Diminished breath sounds bilaterally. No crackles or wheezes.  CVS: S1 &S2 heard. No murmur.  Irregular rhythm Abdomen: Soft, nontender, nondistended.  Bowel sounds are heard.   Extremities: No cyanosis, clubbing with ++ peripheral edema.  Peripheral pulses are palpable.  Right upper extremity PICC line in place Psych: Alert, awake and oriented, normal mood CNS:  No cranial nerve deficits.  Power equal in all extremities.  Skin: Warm and dry.  No rashes noted.  Data Review: I have personally reviewed the following laboratory data and studies,  CBC: Recent Labs  Lab 05/14/20 2317 05/14/20 2338 05/16/20 0013 05/17/20 0102 05/18/20 0105 05/19/20 0023 05/20/20 0435  WBC 6.1   < > 5.4 5.8 6.7 6.0 7.6  NEUTROABS 2.7  --   --   --   --   --   --   HGB 12.6   < > 11.5* 11.2* 12.0 12.1 10.9*  HCT 40.2   < > 35.1* 32.8* 35.6* 35.1* 31.1*  MCV 102.0*   < > 97.8 95.9 95.4 93.1 92.0  PLT 80*   < > 77* 66* 63* 67* 61*   < > = values in this interval not displayed.   Basic Metabolic Panel: Recent Labs  Lab 05/16/20 0013 05/17/20 0102 05/18/20 0105 05/19/20 0023 05/20/20 0435  NA 135 136 136 137 136  K 4.9 4.4 4.4 4.1 3.7   CL 106 108 108 107 106  CO2 18* 18* 20* 20* 21*  GLUCOSE 130* 130* 117* 114* 115*  BUN 76* 75* 75* 74* 77*  CREATININE 4.20* 4.26* 4.27* 4.08* 3.80*  CALCIUM 9.3 9.0 9.2 9.1 9.0  MG 2.1 1.9 2.1 2.0 1.7  PHOS 5.0* 4.5 4.4 3.8 3.6   Liver Function Tests: Recent Labs  Lab 05/14/20 2317 05/15/20 0637 05/16/20 0013  AST 99* 98* 109*  ALT 46* 48* 52*  ALKPHOS 102 90 97  BILITOT 1.8* 1.6* 1.3*  PROT 6.3* 6.0* 5.6*  ALBUMIN 3.4* 3.0* 3.0*   No results for input(s): LIPASE, AMYLASE in the last 168 hours. No results for input(s): AMMONIA in the last 168 hours. Cardiac Enzymes: Recent Labs  Lab 05/14/20 2331  CKTOTAL 241*   BNP (last 3 results) No results for input(s): BNP in the last 8760 hours.  ProBNP (last 3 results) No results for input(s): PROBNP in the last 8760 hours.  CBG: Recent Labs  Lab 05/14/20 2317  GLUCAP 134*   Recent Results (from the past 240 hour(s))  Urine culture     Status: None   Collection Time: 05/14/20 11:18 PM   Specimen: Urine, Random  Result Value Ref Range Status   Specimen Description URINE, RANDOM  Final   Special Requests NONE  Final   Culture   Final    NO GROWTH Performed at [facility], [address], [place], [place] [address]    Report Status 05/16/2020 FINAL  Final  Culture, blood (Routine x 2)     Status: Abnormal   Collection Time: 05/14/20 11:27 PM   Specimen: BLOOD  Result Value Ref Range Status   Specimen Description BLOOD LEFT ANTECUBITAL  Final   Special Requests   Final    BOTTLES DRAWN AEROBIC AND ANAEROBIC Blood Culture adequate volume   Culture  Setup Time   Final    GRAM POSITIVE COCCI IN CLUSTERS AEROBIC BOTTLE ONLY CRITICAL RESULT CALLED TO, READ BACK BY AND VERIFIED WITH: T,JACKY PHARMD @0902  05/16/20 EB    Culture (A)  Final    STAPHYLOCOCCUS EPIDERMIDIS THE SIGNIFICANCE OF ISOLATING THIS ORGANISM FROM A SINGLE SET OF BLOOD CULTURES WHEN MULTIPLE SETS ARE DRAWN IS UNCERTAIN. PLEASE NOTIFY THE  MICROBIOLOGY DEPARTMENT WITHIN ONE WEEK IF SPECIATION AND SENSITIVITIES ARE REQUIRED. Performed at [facility], [place] [address], [place],  [address]    Report Status 05/18/2020 FINAL  Final  Blood Culture ID Panel (Reflexed)     Status:  Abnormal   Collection Time: 05/14/20 11:27 PM  Result Value Ref Range Status   Enterococcus faecalis NOT DETECTED NOT DETECTED Final   Enterococcus Faecium NOT DETECTED NOT DETECTED Final   Listeria monocytogenes NOT DETECTED NOT DETECTED Final   Staphylococcus species DETECTED (A) NOT DETECTED Final    Comment: CRITICAL RESULT CALLED TO, READ BACK BY AND VERIFIED WITH: T,JACKY PHARMD @0902  05/16/20 EB    Staphylococcus aureus (BCID) NOT DETECTED NOT DETECTED Final   Staphylococcus epidermidis DETECTED (A) NOT DETECTED Final    Comment: Methicillin (oxacillin) resistant coagulase negative staphylococcus. Possible blood culture contaminant (unless isolated from more than one blood culture draw or clinical case suggests pathogenicity). No antibiotic treatment is indicated for blood  culture contaminants. CRITICAL RESULT CALLED TO, READ BACK BY AND VERIFIED WITH: T,JACKY PHARMD @0902  05/16/20 EB    Staphylococcus lugdunensis NOT DETECTED NOT DETECTED Final   Streptococcus species NOT DETECTED NOT DETECTED Final   Streptococcus agalactiae NOT DETECTED NOT DETECTED Final   Streptococcus pneumoniae NOT DETECTED NOT DETECTED Final   Streptococcus pyogenes NOT DETECTED NOT DETECTED Final   A.calcoaceticus-baumannii NOT DETECTED NOT DETECTED Final   Bacteroides fragilis NOT DETECTED NOT DETECTED Final   Enterobacterales NOT DETECTED NOT DETECTED Final   Enterobacter cloacae complex NOT DETECTED NOT DETECTED Final   Escherichia coli NOT DETECTED NOT DETECTED Final   Klebsiella aerogenes NOT DETECTED NOT DETECTED Final   Klebsiella oxytoca NOT DETECTED NOT DETECTED Final   Klebsiella pneumoniae NOT DETECTED NOT DETECTED Final   Proteus species NOT  DETECTED NOT DETECTED Final   Salmonella species NOT DETECTED NOT DETECTED Final   Serratia marcescens NOT DETECTED NOT DETECTED Final   Haemophilus influenzae NOT DETECTED NOT DETECTED Final   Neisseria meningitidis NOT DETECTED NOT DETECTED Final   Pseudomonas aeruginosa NOT DETECTED NOT DETECTED Final   Stenotrophomonas maltophilia NOT DETECTED NOT DETECTED Final   Candida albicans NOT DETECTED NOT DETECTED Final   Candida auris NOT DETECTED NOT DETECTED Final   Candida glabrata NOT DETECTED NOT DETECTED Final   Candida krusei NOT DETECTED NOT DETECTED Final   Candida parapsilosis NOT DETECTED NOT DETECTED Final   Candida tropicalis NOT DETECTED NOT DETECTED Final   Cryptococcus neoformans/gattii NOT DETECTED NOT DETECTED Final   Methicillin resistance mecA/C DETECTED (A) NOT DETECTED Final    Comment: CRITICAL RESULT CALLED TO, READ BACK BY AND VERIFIED WITH: T,JACKY PHARMD @0902  05/16/20 EB Performed at [facility], [address], [place], [place] [address]   MRSA PCR Screening     Status: None   Collection Time: 05/15/20  2:28 AM   Specimen: Nasopharyngeal  Result Value Ref Range Status   MRSA by PCR NEGATIVE NEGATIVE Final    Comment:        The GeneXpert MRSA Assay (FDA approved for NASAL specimens only), is one component of a comprehensive MRSA colonization surveillance program. It is not intended to diagnose MRSA infection nor to guide or monitor treatment for MRSA infections. Performed at [facility], [place] [address], [place], [place] [address]   Culture, blood (Routine x 2)     Status: None   Collection Time: 05/15/20  2:34 AM   Specimen: BLOOD RIGHT HAND  Result Value Ref Range Status   Specimen Description BLOOD RIGHT HAND  Final   Special Requests   Final    BOTTLES DRAWN AEROBIC AND ANAEROBIC Blood Culture results may not be optimal due to an inadequate volume of blood received in culture  bottles   Culture   Final    NO GROWTH 5 DAYS Performed  at [facility], [place] [address] [place], [place] [address]    Report Status 05/20/2020 FINAL  Final  Resp Panel by RT-PCR (Flu A&B, Covid) Nasopharyngeal Swab     Status: None   Collection Time: 05/15/20  4:24 AM   Specimen: Nasopharyngeal Swab; Nasopharyngeal(NP) swabs in vial transport medium  Result Value Ref Range Status   SARS Coronavirus 2 by RT PCR NEGATIVE NEGATIVE Final    Comment: (NOTE) SARS-CoV-2 target nucleic acids are NOT DETECTED.  The SARS-CoV-2 RNA is generally detectable in upper respiratory specimens during the acute phase of infection. The lowest concentration of SARS-CoV-2 viral copies this assay can detect is 138 copies/mL. A negative result does not preclude SARS-Cov-2 infection and should not be used as the sole basis for treatment or other patient management decisions. A negative result may occur with  improper specimen collection/handling, submission of specimen other than nasopharyngeal swab, presence of viral mutation(s) within the areas targeted by this assay, and inadequate number of viral copies(<138 copies/mL). A negative result must be combined with clinical observations, patient history, and epidemiological information. The expected result is Negative.  Fact Sheet for Patients:  EntrepreneurPulse.com.au  Fact Sheet for Healthcare Providers:  IncredibleEmployment.be  This test is no t yet approved or cleared by the [place] FDA and  has been authorized for detection and/or diagnosis of SARS-CoV-2 by FDA under an Emergency Use Authorization (EUA). This EUA will remain  in effect (meaning this test can be used) for the duration of the COVID-19 declaration under Section 564(b)(1) of the Act, 21 U.S.C.section 360bbb-3(b)(1), unless the authorization is terminated  or revoked sooner.       Influenza A by PCR NEGATIVE NEGATIVE Final   Influenza B by PCR NEGATIVE NEGATIVE Final    Comment: (NOTE) The  Xpert Xpress SARS-CoV-2/FLU/RSV plus assay is intended as an aid in the diagnosis of influenza from Nasopharyngeal swab specimens and should not be used as a sole basis for treatment. Nasal washings and aspirates are unacceptable for Xpert Xpress SARS-CoV-2/FLU/RSV testing.  Fact Sheet for Patients: EntrepreneurPulse.com.au  Fact Sheet for Healthcare Providers: IncredibleEmployment.be  This test is not yet approved or cleared by the [place] FDA and has been authorized for detection and/or diagnosis of SARS-CoV-2 by FDA under an Emergency Use Authorization (EUA). This EUA will remain in effect (meaning this test can be used) for the duration of the COVID-19 declaration under Section 564(b)(1) of the Act, 21 U.S.C. section 360bbb-3(b)(1), unless the authorization is terminated or revoked.  Performed at [facility], [place] [address] [place], [place] [address]      Studies: No results found.    Flora Lipps, MD  [facility] 05/20/2020  If 7PM-7AM, please contact night-coverage

## 2020-05-20 NOTE — TOC Transition Note (Addendum)
Transition of Care [facility]) - CM/SW Discharge Note   Patient Details  Name: Erin Liu MRN: [id number] Date of Birth: 10/31/1940  [facility] CM/SW Contact:  Zenon Mayo, RN Phone Number: 05/20/2020, 10:20 AM   Clinical Narrative:    NCM made referral to [facility] with [name] for [place], [place], SW.  Tommi Rumps was able to take referral soc will begin 24 to 48 hrs post dc.  Will need orders.  Pt lives at home with pt's son who is there 24/7. Pt has DME including rollingwalker, rollator, bedside commode, tub chair, and wheelchair. Daughter is okay with [place] and wants [facility]. Pt will need [facility]AR transport at time of discharge.  3/16 - NCM spoke with patient daughter Desmond Lope [phone]. She states they would like to have a hospital bed on day of dc.  NCM asked if the bed needed to be there before the patient. She states as long as the bed gets there today before patient..  Patient will be transported by ptar.  Awaiting call back from [facility] with [employer] to see what time the bed may be delivered. Daughter says it is too much for her to get all this done today so she would like for patient to be dc tomorrow.  NCM informed MD and MD says it is fine for her to go tomorrow.  She will need to go by ptar.  NCM informed Tommi Rumps with [name] she will be dc tomorrow.    Final next level of care: [place] Barriers to Discharge: Continued Medical Work up   Patient Goals and CMS Choice Patient states their goals for this hospitalization and ongoing recovery are:: daughter wants pt to go home with [facility] CMS Medicare.gov Compare Post Acute Care list provided to:: Patient Represenative (must comment) (Daughter) Choice offered to / list presented to : Adult Children  Discharge Placement                       Discharge Plan and Services     Post Acute Care Choice: Home Health                    HH Arranged: RN,PT,OT,Social Work [facility] Agency: [place] Date [facility] Agency Contacted: 05/20/20 Time [place]: 48 Representative spoke with at [place]: [place] ([place]) Interventions     Readmission Risk Interventions No flowsheet data found.

## 2020-05-20 NOTE — Progress Notes (Addendum)
Patient HR elevated from baseline 100-110s to 130-150s.  12 lead EKG initially read wide QRS tachycardia, secondary EKG resulted as atrial fibrillation with 2:1 AV conduction. Patient asymptomatic.  BP 107/66.  Both primary and cardiology service paged.  Primary came to bedside to evaluate patient.  Cardiology called with updated orders. Milrinone decreased to 0.116mcg/kg/min. Heparin gtt added.  Will continue to monitor and notify cardiology if HR continues to be elevated after 1 hour.   05/20/20 at 0045 - HR improved to 110s.  Dr. Sonia Side updated via text page.  No new orders, will continue to monitor.

## 2020-05-20 NOTE — Progress Notes (Signed)
   RN called regarding SBP 90s and patient is due for Lasix 80 mg IV.   CVP is currently 8.  Okay to hold this p.m. dose of Lasix, resume in a.m.  Continue to track CVP's.  Rosaria Ferries, PA-C 05/20/2020 5:29 PM

## 2020-05-21 DIAGNOSIS — I951 Orthostatic hypotension: Secondary | ICD-10-CM

## 2020-05-21 LAB — COOXEMETRY PANEL
Carboxyhemoglobin: 1.3 % (ref 0.5–1.5)
Methemoglobin: 1.1 % (ref 0.0–1.5)
O2 Saturation: 70.4 %
Total hemoglobin: 11 g/dL — ABNORMAL LOW (ref 12.0–16.0)

## 2020-05-21 LAB — BASIC METABOLIC PANEL
Anion gap: 9 (ref 5–15)
BUN: 79 mg/dL — ABNORMAL HIGH (ref 8–23)
CO2: 22 mmol/L (ref 22–32)
Calcium: 8.7 mg/dL — ABNORMAL LOW (ref 8.9–10.3)
Chloride: 105 mmol/L (ref 98–111)
Creatinine, Ser: 3.89 mg/dL — ABNORMAL HIGH (ref 0.44–1.00)
GFR, Estimated: 11 mL/min — ABNORMAL LOW (ref >60)
Glucose, Bld: 106 mg/dL — ABNORMAL HIGH (ref 70–99)
Potassium: 3.8 mmol/L (ref 3.5–5.1)
Sodium: 136 mmol/L (ref 135–145)

## 2020-05-21 LAB — CBC
HCT: 29.6 % — ABNORMAL LOW (ref 36.0–46.0)
Hemoglobin: 10.4 g/dL — ABNORMAL LOW (ref 12.0–15.0)
MCH: 32 pg (ref 26.0–34.0)
MCHC: 35.1 g/dL (ref 30.0–36.0)
MCV: 91.1 fL (ref 80.0–100.0)
Platelets: 61 10*3/uL — ABNORMAL LOW (ref 150–400)
RBC: 3.25 MIL/uL — ABNORMAL LOW (ref 3.87–5.11)
RDW: 20 % — ABNORMAL HIGH (ref 11.5–15.5)
WBC: 7.3 10*3/uL (ref 4.0–10.5)
nRBC: 1.2 % — ABNORMAL HIGH (ref 0.0–0.2)

## 2020-05-21 LAB — HEPARIN LEVEL (UNFRACTIONATED): Heparin Unfractionated: 1.04 IU/mL — ABNORMAL HIGH (ref 0.30–0.70)

## 2020-05-21 LAB — APTT: aPTT: >200 seconds (ref 24–36)

## 2020-05-21 MED ORDER — TORSEMIDE 40 MG PO TABS: 40.0000 mg | ORAL_TABLET | Freq: Two times a day (BID) | ORAL | 0 refills | Status: DC

## 2020-05-21 MED ORDER — ASPIRIN 81 MG PO TBEC: 81.0000 mg | DELAYED_RELEASE_TABLET | Freq: Every day | ORAL | 11 refills | Status: AC

## 2020-05-21 MED ORDER — HEPARIN (PORCINE) 25000 UT/250ML-% IV SOLN
650.0000 [IU]/h | INTRAVENOUS | Status: DC
  Administered 2020-05-21: 650 [IU]/h via INTRAVENOUS
  Filled 2020-05-21: qty 250

## 2020-05-21 MED ORDER — TORSEMIDE 20 MG PO TABS
40.0000 mg | ORAL_TABLET | Freq: Two times a day (BID) | ORAL | Status: DC
  Administered 2020-05-21 – 2020-05-22 (×2): 40 mg via ORAL
  Filled 2020-05-21 (×2): qty 2

## 2020-05-21 NOTE — Plan of Care (Signed)

## 2020-05-21 NOTE — Progress Notes (Signed)
Progress Note  Patient Name: Erin Liu Date of Encounter: 05/21/2020  [facility] Cardiologist: Minus Breeding, MD   Subjective   Family has decided to transition to Palliative Care with hopes to get home today with Hospice.    Inpatient Medications    Scheduled Meds: . aspirin EC  81 mg Oral Daily  . atorvastatin  40 mg Oral Daily  . Chlorhexidine Gluconate Cloth  6 each Topical Daily  . latanoprost  1 drop Both Eyes QHS  . sodium chloride flush  10-40 mL Intracatheter Q12H  . torsemide  40 mg Oral BID   Continuous Infusions: . heparin 650 Units/hr (05/21/20 0235)  . milrinone 0.125 mcg/kg/min (05/20/20 1400)  . promethazine (PHENERGAN) injection     PRN Meds: acetaminophen **OR** acetaminophen, promethazine (PHENERGAN) injection, senna-docusate, sodium chloride flush   Vital Signs    Vitals:   05/20/20 1700 05/20/20 1935 05/20/20 2300 05/21/20 0402  BP: (!) 92/58 97/73 128/65 121/73  Pulse: (!) 106 (!) 102 (!) 113 (!) 105  Resp: 14 20 19 18   Temp:  97.8 F (36.6 C) 98.5 F (36.9 C) 97.9 F (36.6 C)  TempSrc:  Oral Oral Oral  SpO2: 100% 100% 100% 100%  Weight:    82.6 kg  Height:        Intake/Output Summary (Last 24 hours) at 05/21/2020 0738 Last data filed at 05/21/2020 0426 Gross per 24 hour  Intake 117.25 ml  Output 2050 ml  Net -1932.75 ml   Last 3 Weights 05/21/2020 05/20/2020 05/19/2020  Weight (lbs) 182 lb 1.6 oz 184 lb 1.4 oz 188 lb 7.9 oz  Weight (kg) 82.6 kg 83.5 kg 85.5 kg      Telemetry    Sinus tachycardia - Personally Reviewed  ECG    No new EKG to review - Personally Reviewed  Physical Exam   GEN: ill appearing, frail HEENT: Normal NECK: No JVD; No carotid bruits LYMPHATICS: No lymphadenopathy CARDIAC:RRR, no murmurs, rubs, gallops RESPIRATORY:  Clear to auscultation without rales, wheezing or rhonchi  ABDOMEN: Soft, non-tender, non-distended MUSCULOSKELETAL:  2+LE edema; No deformity  SKIN: Warm and dry NEUROLOGIC:   Alert and oriented x 3 PSYCHIATRIC:  Normal affect    Labs    High Sensitivity Troponin:   Recent Labs  Lab 05/15/20 0637 05/15/20 0922 05/15/20 1550  TROPONINIHS 7,635* 7,341* 8,905*      Chemistry Recent Labs  Lab 05/14/20 2317 05/14/20 2338 05/15/20 6283 05/16/20 0013 05/17/20 0102 05/19/20 0023 05/20/20 0435 05/21/20 0453  NA 136   < > 137 135   < > 137 136 136  K 5.1   < > 5.2* 4.9   < > 4.1 3.7 3.8  CL 103   < > 103 106   < > 107 106 105  CO2 14*  --  20* 18*   < > 20* 21* 22  GLUCOSE 169*   < > 149* 130*   < > 114* 115* 106*  BUN 81*   < > 75* 76*   < > 74* 77* 79*  CREATININE 4.41*   < > 4.19* 4.20*   < > 4.08* 3.80* 3.89*  CALCIUM 9.8  --  9.6 9.3   < > 9.1 9.0 8.7*  PROT 6.3*  --  6.0* 5.6*  --   --   --   --   ALBUMIN 3.4*  --  3.0* 3.0*  --   --   --   --   AST 99*  --  98* 109*  --   --   --   --   ALT 46*  --  48* 52*  --   --   --   --   ALKPHOS 102  --  90 97  --   --   --   --   BILITOT 1.8*  --  1.6* 1.3*  --   --   --   --   GFRNONAA 10*  --  10* 10*   < > 11* 11* 11*  ANIONGAP 19*  --  14 11   < > 10 9 9    < > = values in this interval not displayed.     Hematology Recent Labs  Lab 05/19/20 0023 05/20/20 0435 05/21/20 0453  WBC 6.0 7.6 7.3  RBC 3.77* 3.38* 3.25*  HGB 12.1 10.9* 10.4*  HCT 35.1* 31.1* 29.6*  MCV 93.1 92.0 91.1  MCH 32.1 32.2 32.0  MCHC 34.5 35.0 35.1  RDW 19.9* 19.7* 20.0*  PLT 67* 61* 61*    BNPNo results for input(s): BNP, PROBNP in the last 168 hours.   DDimer No results for input(s): DDIMER in the last 168 hours.   Radiology    No results found.  Cardiac Studies   Echo 05/16/20: 1. Left ventricular ejection fraction, by estimation, is 20 to 25%. The  left ventricle has severely decreased function. The left ventricle  demonstrates global hypokinesis. The left ventricular internal cavity size  was moderately dilated. Left  ventricular diastolic parameters are indeterminate. There is the   interventricular septum is flattened in diastole ('D' shaped left  ventricle), consistent with right ventricular volume overload.  2. Right ventricular systolic function is moderately reduced. The right  ventricular size is mildly enlarged. There is severely elevated pulmonary  artery systolic pressure. The estimated right ventricular systolic  pressure is 09.3 mmHg.  3. Left atrial size was moderately dilated.  4. Right atrial size was severely dilated.  5. The mitral valve is degenerative. Severe mitral valve regurgitation,  mechanism likely secondary MR due to LV dysfunction. No evidence of mitral  stenosis.  6. Tricuspid valve regurgitation is severe.  7. Small mobile strand on aortic aspect of left coronary cusp, not well  seen on prior study from 10/03/2017. The aortic valve is grossly normal.  Aortic valve regurgitation is trivial. No aortic stenosis is present.  8. The inferior vena cava is dilated in size with <50% respiratory  variability, suggesting right atrial pressure of 15 mmHg.    Patient Profile     80 y.o. female hx of diabetes, hypertension, LBBB, dementia, and CKD IVadmitted with pneumonia and fall. Cardiology consulted for elevated troponin. Hospitalization complicated by acute on chronic renal failure and aneuria  Assessment & Plan    # Acute systolic and diastolic heart failure: # Cardiogenic shock:  Echo 05/16/20 revealed severe biventricular heart failure.  LVEF 20 to 25% with moderately reduced right ventricular function.  PASP was 61.8 mmHg and right atrial pressure 15 mmHg. Likely contributing to her anuria.  Options discussed with the patient's daughter and Dr. Oval Linsey last week who agreed with palliative care consultation.  -In the interim, the decision was made to proceed with stabilization with inotropes and diuresis.  -Now s/p PICC line placement and initiation of milrinone 0.25 with improvement in co-ox from 39-->78.5-->72-->73.9-->70.  -She  put out 2L yesterday but got hypotensive last night and PM lasix dose held -SCr trending upward to 3.89 -continue compression hose for LE edema -discussed  with TRH and really no endpoint.  She is not a candidate for advanced HF therapies -plan to stop Milrinone today -lasix has been changed to Torsemide 40mg  BID -patient is transitioning to Palliative Care with hopes of going home today  # Elevated troponin: -Troponin is markedly elevated to nearly 9000.  -She has a chronic left bundle branch block.  -She continues to deny chest pain or pressure.  -No ischemic work up given her comorbid disease and will proceed with medical management.  -Continue aspirin 81 mg daily and statin  # Unresponsiveness/Fall:  Unclear if this was syncope. She is unable to stand for orthostatics.   # Acute on chronic renal failure:  # Aneuria: -Nephrology following. Baseline SCr 2.6 -No plans for HD.  -Managing cardiogenic shock as above.  # CAP: # Septic shock: Antibiotics per primary team. Likely mixed septic/cardiogenic shock.  #Atrial Flutter with RVR and 2:1 conduction -now back in NSR -would stop Heparin and not add DOAC as she is going home with Hospice and prognosis is grim -BP will not support addition of BB     For questions or updates, please contact [facility] Please consult [email or web address] for contact info under        Signed, Fransico Him, MD  05/21/2020, 7:38 AM

## 2020-05-21 NOTE — Progress Notes (Signed)
Patient ID: Erin Liu, female   DOB: [birth year], 80 y.o.   MRN: [id number] [facility] Progress Note   Assessment/ Plan:   1. Acute kidney Injury on chronic kidney disease stage IV: Secondary to ischemic ATN from septic shock plus/minus cardiorenal mechanism from CHF exacerbation/cardiogenic shock.  Urine output fixed on current dose of furosemide/milrinone with creatinine slightly higher today likely related to mild hypotension overnight.  Per my previous discussions with her daughter, but desires not to pursue chronic hemodialysis with her advancing dementia and functional deficits. 2.  Cardiogenic shock with acute exacerbation of biventricular failure: On milrinone and furosemide with fair urine output and relatively stable renal function noted on labs; decision to wean versus discontinue milrinone per cardiology service.  Appreciate input by palliative care service. 3.  Community-acquired pneumonia with acute hypoxic respiratory failure/sepsis: She has completed Rocephin/doxycycline and continues to show stable respiratory status/oxygenation requirements. 4.  Acute encephalopathy: With history of baseline dementia likely worsened by recent hospitalization with sepsis.  Subjective:   Denies any complaints and states that she is urinating a lot.  Nurse at bedside reports some nosebleed and oozing from previous IV cannulation sites prompting temporary cessation of heparin drip.   Objective:   BP 121/73 (BP Location: Left Arm)   Pulse (!) 105   Temp 97.9 F (36.6 C) (Oral)   Resp 18   Ht 5\' 5"  (1.651 m)   Wt 82.6 kg   SpO2 100%   BMI 30.30 kg/m   Intake/Output Summary (Last 24 hours) at 05/21/2020 0716 Last data filed at 05/21/2020 0426 Gross per 24 hour  Intake 117.25 ml  Output 2050 ml  Net -1932.75 ml   Weight change: -0.9 kg  Physical Exam: Gen: Sleeping comfortably in bed, nurses at bedside.  Folded 2 x 2 gauze in right nostril CVS: Pulse regular tachycardia,  S1 and S2 normal Resp: Clear to auscultation bilaterally without any distinct rales or rhonchi Abd: Soft, obese, nontender Ext: Trace bilateral lower extremity edema with 1+ upper extremity edema.  Thrombosed left BBF.  Imaging: No results found.  Labs: BMET Recent Labs  Lab 05/15/20 6314 05/16/20 0013 05/17/20 0102 05/18/20 0105 05/19/20 0023 05/20/20 0435 05/21/20 0453  NA 137 135 136 136 137 136 136  K 5.2* 4.9 4.4 4.4 4.1 3.7 3.8  CL 103 106 108 108 107 106 105  CO2 20* 18* 18* 20* 20* 21* 22  GLUCOSE 149* 130* 130* 117* 114* 115* 106*  BUN 75* 76* 75* 75* 74* 77* 79*  CREATININE 4.19* 4.20* 4.26* 4.27* 4.08* 3.80* 3.89*  CALCIUM 9.6 9.3 9.0 9.2 9.1 9.0 8.7*  PHOS  --  5.0* 4.5 4.4 3.8 3.6  --    CBC Recent Labs  Lab 05/14/20 2317 05/14/20 2338 05/18/20 0105 05/19/20 0023 05/20/20 0435 05/21/20 0453  WBC 6.1   < > 6.7 6.0 7.6 7.3  NEUTROABS 2.7  --   --   --   --   --   HGB 12.6   < > 12.0 12.1 10.9* 10.4*  HCT 40.2   < > 35.6* 35.1* 31.1* 29.6*  MCV 102.0*   < > 95.4 93.1 92.0 91.1  PLT 80*   < > 63* 67* 61* 61*   < > = values in this interval not displayed.   Medications:    . aspirin EC  81 mg Oral Daily  . atorvastatin  40 mg Oral Daily  . Chlorhexidine Gluconate Cloth  6 each Topical Daily  .  furosemide  80 mg Intravenous BID  . latanoprost  1 drop Both Eyes QHS  . sodium chloride flush  10-40 mL Intracatheter Q12H   Elmarie Shiley, MD 05/21/2020, 7:16 AM

## 2020-05-21 NOTE — Progress Notes (Signed)
[facility]) Hospital Liaison RN note:  Notified by [facility] of request for [facility] Palliative services at home after discharge.  [facility] Palliative Team will follow patient for discharge disposition.  Please call with any hospice or outpatient palliative related questions.  Thank you for the opportunity to participate in this patient's care.  Thank you, Margaretmary Eddy, BSN, RN [facility] Liaison [phone]

## 2020-05-21 NOTE — Progress Notes (Signed)
Physical Therapy Treatment Patient Details Name: Erin Liu MRN: [id number] DOB: [birth year] Today's Date: 05/21/2020    History of Present Illness 80 y/o female presenting 3/9 after a fall at home and being unresponsive.  Pt with acute on chronic renal failure, CAP with sepsis and acute metabolic encephalopathy. PMHx: dementia, gout, neuropathy, DM, glaucoma, incontinence, tremor, HLD    PT Comments    Pt lethargic and sleepy on arrival.  She was able to arouse enough to participate.  Emphasis on caregiving education for family, warm up ROM, transition to EOB, sitting balance, sit to stands/standing balance in the STEDY with use of the STEDY to transfer to the recliner.    Follow Up Recommendations  Home health PT;Supervision/Assistance - 24 hour     Equipment Recommendations  Wheelchair (measurements PT);Wheelchair cushion (measurements PT);Hospital bed;Other (comment) (hoyer and pad)    Recommendations for Other Services       Precautions / Restrictions Precautions Precautions: Fall    Mobility  Bed Mobility Overal bed mobility: Needs Assistance Bed Mobility: Supine to Sit     Supine to sit: Max assist;+2 for physical assistance;HOB elevated     General bed mobility comments: assist of LE's off bed, use of pad to pivot pt to EOB, truncal assist up and forward.    Transfers Overall transfer level: Needs assistance Equipment used: Ambulation equipment used Transfers: Sit to/from [facility] Sit to Stand: Mod assist;+2 safety/equipment;+2 physical assistance         General transfer comment: sit to stand x4 into the STEDY, work on attaining upright stance holding to [employer] crossbar.  Use of stedy to transfer to the recliner.  Ambulation/Gait             General Gait Details: unable   Stairs             Wheelchair Mobility    Modified Rankin (Stroke Patients Only)       Balance Overall balance assessment: Needs  assistance Sitting-balance support: No upper extremity supported;Feet supported Sitting balance-Leahy Scale: Poor Sitting balance - Comments: max assist due to posterior bias   Standing balance support: Bilateral upper extremity supported Standing balance-Leahy Scale: Poor Standing balance comment: bil UE support on stedy bar with pelvic cueing and support                            Cognition Arousal/Alertness: Awake/alert Behavior During Therapy: Flat affect Overall Cognitive Status: Impaired/Different from baseline                   Orientation Level: Place;Time;Situation Current Attention Level: Sustained Memory: Decreased short-term memory Following Commands: Follows one step commands with increased time Safety/Judgement: Decreased awareness of safety;Decreased awareness of deficits Awareness: Intellectual          Exercises Other Exercises Other Exercises: Warm up LE ROM exercise prior to mobility. Other Exercises: AA/graded resistance bil UE's in bicep/tricep presses x7 reps.    General Comments General comments (skin integrity, edema, etc.): vss on 4L.  Discussed some tidbits on handling skills based around the hospital bed to be brought to the home, use of padding to help roll and mover to EOB      Pertinent Vitals/Pain Pain Assessment: Faces Faces Pain Scale: No hurt Pain Intervention(s): Monitored during session    Home Living                      Prior  Function            PT Goals (current goals can now be found in the care plan section) Acute Rehab PT Goals Patient Stated Goal: get home and into PACE PT Goal Formulation: With patient/family Time For Goal Achievement: 05/31/20 Potential to Achieve Goals: Fair Progress towards PT goals: Progressing toward goals    Frequency    Min 3X/week      PT Plan Current plan remains appropriate    Co-evaluation              AM-PAC PT "6 Clicks" Mobility   Outcome  Measure  Help needed turning from your back to your side while in a flat bed without using bedrails?: A Lot Help needed moving from lying on your back to sitting on the side of a flat bed without using bedrails?: Total Help needed moving to and from a bed to a chair (including a wheelchair)?: Total Help needed standing up from a chair using your arms (e.g., wheelchair or bedside chair)?: A Lot Help needed to walk in hospital room?: Total Help needed climbing 3-5 steps with a railing? : Total 6 Click Score: 8    End of Session Equipment Utilized During Treatment: Oxygen Activity Tolerance: Patient tolerated treatment well Patient left: in chair;with call bell/phone within reach;with chair alarm set;with family/visitor present Nurse Communication: Need for lift equipment;Mobility status PT Visit Diagnosis: Muscle weakness (generalized) (M62.81);Other abnormalities of gait and mobility (R26.89);Difficulty in walking, not elsewhere classified (R26.2)     Time: 1437-1500 PT Time Calculation (min) (ACUTE ONLY): 23 min  Charges:  $Therapeutic Exercise: 8-22 mins $Therapeutic Activity: 8-22 mins                     05/21/2020  Ginger Carne., PT Acute Rehabilitation Services [phone]  (pager) [phone]  (office)   Tessie Fass Sua Spadafora 05/21/2020, 3:38 PM

## 2020-05-21 NOTE — Plan of Care (Signed)
  Problem: Nutrition: Goal: Adequate nutrition will be maintained Outcome: Progressing   Problem: Pain Managment: Goal: General experience of comfort will improve Outcome: Progressing   Problem: Skin Integrity: Goal: Risk for impaired skin integrity will decrease Outcome: Progressing   

## 2020-05-21 NOTE — Progress Notes (Signed)
Update from Tomi Bamberger CM/SW Patient will not be discharged until tomorrow as the [facility] is not going to be ready today Patient will be getting a hospital bed and require transport home by [facility] on 3/17

## 2020-05-21 NOTE — Care Management Important Message (Signed)
Important Message  Patient Details  Name: Erin Liu MRN: [id number] Date of Birth: [birth year]   Medicare Important Message Given:  Yes     Grace Haggart Montine Circle 05/21/2020, 3:19 PM

## 2020-05-21 NOTE — Progress Notes (Signed)
[place] for Heparin Indication: atrial flutter  Allergies  Allergen Reactions  . Lisinopril Cough    Patient Measurements: Height: 5\' 5"  (165.1 cm) Weight: 83.5 kg (184 lb 1.4 oz) IBW/kg (Calculated) : 57 Heparin Dosing Weight: 75 kg  Vital Signs: Temp: 97.8 F (36.6 C) (03/15 1935) Temp Source: Oral (03/15 1935) BP: 97/73 (03/15 1935) Pulse Rate: 102 (03/15 1935)  Labs: Recent Labs    05/18/20 0105 05/19/20 0023 05/20/20 0435 05/20/20 0804 05/20/20 2125  HGB 12.0 12.1 10.9*  --   --   HCT 35.6* 35.1* 31.1*  --   --   PLT 63* 67* 61*  --   --   HEPARINUNFRC  --   --   --  0.92* 1.08*  CREATININE 4.27* 4.08* 3.80*  --   --     Estimated Creatinine Clearance: 12.6 mL/min (A) (by C-G formula based on SCr of 3.8 mg/dL (H)).   Medical History: Past Medical History:  Diagnosis Date  . ANEMIA, NORMOCYTIC   . Arthritis    back  . BREAST CYST, HX OF   . CARPAL TUNNEL SYNDROME, BILATERAL   . DIABETES MELLITUS, TYPE II   . Glaucoma   . HYPERLIPIDEMIA   . HYPERTENSION   . OBESITY   . PERIPHERAL NEUROPATHY   . RENAL FAILURE, CHRONIC    Stage 4    Medications:  Scheduled:  . aspirin EC  81 mg Oral Daily  . atorvastatin  40 mg Oral Daily  . Chlorhexidine Gluconate Cloth  6 each Topical Daily  . furosemide  80 mg Intravenous BID  . latanoprost  1 drop Both Eyes QHS  . sodium chloride flush  10-40 mL Intracatheter Q12H    Assessment: 80 y.o. female with new onset tachycardia/Aflutter for heparin.   Initial heparin level above goal at 0.92 on 1100 units/hr. Hgb down overnight from 12.1>10.9. Platelet count low but stable 60s. No bleeding issues noted.  3/16 AM update:  Heparin level is elevated Having some mild oozing from previous phlebotomy sites per RN  Goal of Therapy:  Heparin level 0.3-0.7 units/ml Monitor platelets by anticoagulation protocol: Yes   Plan:  Hold heparin x 1.5 hours Re-start heparin drip at  650 units/hr in 1.5 hours  1000 heparin level   Narda Bonds, PharmD, [place] Pharmacist Phone: [phone]

## 2020-05-21 NOTE — Progress Notes (Signed)
   05/21/20 1000  Assess: MEWS Score  BP (!) 86/56  Pulse Rate (!) 106  ECG Heart Rate (!) 106  Resp (!) 24  Level of Consciousness Responds to Voice  SpO2 100 %  O2 Device Room Air  Patient Activity (if Appropriate) In bed  Assess: MEWS Score  MEWS Temp 0  MEWS Systolic 1  MEWS Pulse 1  MEWS RR 1  MEWS LOC 1  MEWS Score 4  MEWS Score Color Red  Assess: if the MEWS score is Yellow or Red  Were vital signs taken at a resting state? Yes  Focused Assessment No change from prior assessment  Early Detection of Sepsis Score *See Row Information* Medium  MEWS guidelines implemented *See Row Information* No, previously red, continue vital signs every 4 hours  Escalate  MEWS: Escalate Red: discuss with charge nurse/RN and provider, consider discussing with RRT  Notify: Charge Nurse/RN  Name of Charge Nurse/RN Notified Judeth Cornfield, RN  Date Charge Nurse/RN Notified 05/21/20  Time Charge Nurse/RN Notified 1006  Notify: Provider  Provider Name/Title [facility]  Date Provider Notified 05/21/20  Time Provider Notified 1006  Notification Type Page  Notification Reason Other (Comment) (RED MEWS, previously in the same BP level)  Document  Patient Outcome Other (Comment) (stable)  Progress note created (see row info) Yes

## 2020-05-21 NOTE — Discharge Summary (Addendum)
Physician Discharge Summary  Erin Liu UUE:[id number] DOB: 10/19/1940 DOA: 05/14/2020  PCP: Binnie Rail, MD  Admit date: 05/14/2020 Discharge date: 05/21/2020  Admitted From: Home  Discharge disposition: Home with Home Health  Recommendations for Outpatient Follow-Up:   Follow up with your primary care provider in one week.  Check CBC, BMP, magnesium in the next visit Follow-up with palliative care at home.  Would encourage hospice involvement if her condition deteriorates.  Discharge Diagnosis:   Principal Problem:   Renal failure (ARF), acute on chronic (HCC) Active Problems:   Hypotension   Hypothermia   CAP (community acquired pneumonia)   Acute metabolic encephalopathy   Sepsis ([place])   Fall at home, initial encounter   ARF (acute renal failure) (HCC)   Prolonged QT interval   Elevated lactic acid level   Cardiogenic shock (HCC)   Non-ST elevation (NSTEMI) myocardial infarction [facility])   Discharge Condition: Improved.  Diet recommendation: Low sodium, heart healthy.    Wound care: None.  Code status: Full.   History of Present Illness:   Erin Liu is a 80 y.o. female with medical history significant for  history of anemia, diabetes mellitus, dementia, hyperlipidemia, hypertension, renal failure not on dialysis who presented to the EMS after a fall at home and being unresponsive.  In the ED, patient was found to be hypothermic and hypotensive.  Patient was given IV fluid hydration and placed on a Retail banker.  CT of the head and neck were obtained and showed no acute pathology.  Patient was found to have elevated creatinine of 4.4 from her baseline of 2.6.  Patient was then admitted hospital for further evaluation and treatment.   During hospitalization, patient was noted to have extremely elevated troponin and cardiology was consulted.  However patient denied any chest pain.  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  Patient was noted to have severe systolic  cardiomyopathy and inotropes were initiated. Palliative care followed the patient during hospitalization.  Family requested home with home health initially followed by [facility] program and possible palliative care as outpatient.   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   Thought to be secondary to ischemic ATN from septic shock/cardiogenic etiology.  Renal ultrasound showed small kidneys mostly on the left with increased echogenicity.  Nephrology on board.    Fistula in place.  Not on dialysis.  Currently received CHF management with inotrope's during hospitalisation.    No plans for hemodialysis as per nephrology after  discussion with the patient's family.   Acute systolic and diastolic heart failure with cardiogenic shock.   2D echocardiogram showed LV second fraction of 20 to 25% with elevated pulmonary artery systolic pressure.  received  milrinone drip which has been discontinued at this time.  Cardiology followed the patient during hospitalization.  Received IV Lasix during hospitalization as well.  Patient will benefit from a palliative care hospice as outpatient.   Severe sepsis likely secondary to community-acquired pneumonia with acute hypoxic respiratory failure. Completed Rocephin and doxycycline 5-day course.  On room air.  Blood cultures with staph epidermidis likely contaminant..Culture was negative..  Initially had high lactate was improved could have been component of sepsis and cardiogenic shock.  She is currently on room air.   Wide-complex tachycardia likely secondary to atrial flutter with 2: 1 conduction and aberration.  Cardiology saw the patient during hospitalization.  Unable to use  beta-blockers due to cardiogenic shock.  Cardiology does not wish anticoagulation due to her palliative level of care.    Hypotension Secondary to systolic congestive heart  failure. Continue to hold antihypertensives.  Received milrinone during hospitalization.   Significantly elevated troponin likely NSTEMI On presentation. No further ischemic workup done due to comorbid status.    2D echocardiogram showed LV ejection fraction of 20 to 25% with global hypokinesis. on aspirin, Lipitor.  Briefly was on heparin drip during hospitalization     Hypothermia On presentation.  Has resolved    Acute metabolic encephalopathy   likely secondary to acute kidney injury and sepsis.    CT of the head did not show any hemorrhage or acute stroke.    MRI of the brain was degraded study but most compatible with chronic small vessel disease.     Fall at home, initial encounter Continue precautions.    Recommended a skilled nursing facility placement but family wishes home with home health with palliative care follow-up    Goals of care. Palliative care was consulted due to heart failure, hypotension, cardiogenic shock, poor perfusion with CKD and sepsis.  Family wished to have palliative care as outpatient on discharge.  At this time milrinone drip has been discontinued.  Patient would benefit from palliative care hospice as outpatient. full code.  Asymptomatic bacteruria. UTI ruled out.   Disposition.  At this time, patient is stable for disposition to home with home health with palliative care/hospice transition and follow-up  Medical Consultants:   Cardiology Nephrology  Procedures:    PICC line placement Subjective:   Today, patient was seen and examined.  Patient denies any nausea vomiting shortness of breath chest pain  Discharge Exam:   Vitals:   05/21/20 1004 05/21/20 1043  BP: (!) 89/68 (!) 103/58  Pulse:  (!) 106  Resp: 17 18  Temp:  98.5 F (36.9 C)  SpO2:  100%   Vitals:   05/21/20 0900 05/21/20 1000 05/21/20 1004 05/21/20 1043  BP: 105/79 (!) 86/56 (!) 89/68 (!) 103/58  Pulse: 100 (!) 106  (!) 106  Resp: 17 (!) 24 17 18   Temp:    98.5 F  (36.9 C)  TempSrc:    Oral  SpO2: 100% 100%  100%  Weight:      Height:        General: Alert awake on verbal command,, not in obvious distress, ill-appearing, frail elderly female HENT: pupils equally reacting to light,  No scleral pallor or icterus noted. Oral mucosa is moist.  Chest:  Clear breath sounds.  Diminished breath sounds bilaterally. No crackles or wheezes.  CVS: S1 &S2 heard. No murmur.  Regular rate and rhythm. Abdomen: Soft, nontender, nondistended.  Bowel sounds are heard.   Extremities: No cyanosis, clubbing but lower extremity edema bilaterally, peripheral pulses are palpable. Psych: Alert, awake and communicative, normal mood CNS:  No cranial nerve deficits.  Power equal in all extremities.   Skin: Warm and dry.  No rashes noted.  The results of significant diagnostics from this hospitalization (including imaging, microbiology, ancillary and laboratory) are listed below for reference.     Diagnostic Studies:   CT Head Wo Contrast  Result Date: 05/15/2020 CLINICAL DATA:  Facial trauma, neck trauma EXAM: CT HEAD WITHOUT CONTRAST CT CERVICAL SPINE WITHOUT CONTRAST TECHNIQUE: Multidetector CT imaging of the head and cervical spine was performed following the standard protocol without intravenous contrast. Multiplanar CT image reconstructions of the cervical spine were also generated. COMPARISON:  CT head 10/02/2017 FINDINGS: CT HEAD FINDINGS Brain: Normal anatomic configuration. Parenchymal volume loss is commensurate with the patient's age. Moderate periventricular white matter changes are present likely reflecting the sequela of small vessel ischemia. Remote lacunar infarcts are noted within the right thalamus, right basal ganglia, left caudate nucleus, and right centrum semiovale. Small infarct within the centrum semiovale appears new since prior examination, though is remote in appearance. No abnormal intra or extra-axial mass lesion or fluid collection. No abnormal mass  effect or midline shift. No evidence of acute intracranial hemorrhage or infarct. Ventricular size is normal. Cerebellum unremarkable. Vascular: No asymmetric hyperdense vasculature at the skull base. Skull: Intact Sinuses/Orbits: Paranasal sinuses are clear. Orbits are unremarkable. Other: Mastoid air cells and middle ear cavities are clear. Mild right supraorbital soft tissue swelling. CT CERVICAL SPINE FINDINGS Alignment: Straightening of the cervical spine may be positional in nature. No listhesis. Skull base and vertebrae: No acute fracture. No primary bone lesion or focal pathologic process. Soft tissues and spinal canal: No prevertebral fluid or swelling. No visible canal hematoma. Disc levels: There is intervertebral disc space narrowing and endplate remodeling at Z0-2 in keeping with changes of mild degenerative disc disease. Mild endplate changes are also noted at C4-5 and C5-6 in keeping with minimal degenerative disc disease at these levels. The spinal canal is widely patent. The prevertebral soft tissues are not thickened on sagittal reformats. Review of the axial images demonstrates mild bilateral neuroforaminal narrowing, left greater than right, at C3-4 secondary to uncovertebral arthrosis. Upper chest: Small left pleural effusion. Asymmetric soft tissue swelling is seen involving the visualized left anterior chest wall. Other: None IMPRESSION: No acute intracranial abnormality. No calvarial fracture. Mild right supraorbital soft tissue swelling. No acute fracture or listhesis of the cervical spine. Asymmetric left anterior chest wall soft tissue swelling, incompletely evaluated on this examination. Small left pleural effusion also identified. Electronically Signed   By: Fidela Salisbury MD   On: 05/15/2020 00:08   CT Cervical Spine Wo Contrast  Result Date: 05/15/2020 CLINICAL DATA:  Facial trauma, neck trauma EXAM: CT HEAD WITHOUT CONTRAST CT CERVICAL SPINE WITHOUT CONTRAST TECHNIQUE:  Multidetector CT imaging of the head and cervical spine was performed following the standard protocol without intravenous contrast. Multiplanar CT image reconstructions of the cervical spine were also generated. COMPARISON:  CT head 10/02/2017 FINDINGS: CT HEAD FINDINGS Brain: Normal anatomic configuration. Parenchymal volume loss is commensurate with the patient's age. Moderate periventricular white matter changes are present likely reflecting the sequela of small vessel ischemia. Remote lacunar infarcts are noted within the right thalamus, right basal ganglia, left caudate nucleus, and right centrum semiovale. Small infarct within the centrum semiovale appears new since prior examination, though is remote in appearance. No abnormal intra or extra-axial mass lesion or fluid collection. No abnormal mass effect or midline shift. No evidence of acute intracranial hemorrhage or infarct. Ventricular size is normal. Cerebellum unremarkable. Vascular: No asymmetric hyperdense vasculature at the skull base. Skull: Intact Sinuses/Orbits: Paranasal sinuses are clear. Orbits are unremarkable. Other: Mastoid air cells and middle ear cavities are clear. Mild right supraorbital soft tissue swelling. CT CERVICAL SPINE FINDINGS Alignment: Straightening of the cervical spine may be positional in nature. No listhesis. Skull base and vertebrae: No acute fracture. No primary bone lesion or focal pathologic process. Soft tissues and spinal canal: No prevertebral fluid or swelling. No visible canal hematoma. Disc levels: There is intervertebral disc space narrowing and endplate remodeling at H8-5 in keeping with changes of mild  degenerative disc disease. Mild endplate changes are also noted at C4-5 and C5-6 in keeping with minimal degenerative disc disease at these levels. The spinal canal is widely patent. The prevertebral soft tissues are not thickened on sagittal reformats. Review of the axial images demonstrates mild bilateral  neuroforaminal narrowing, left greater than right, at C3-4 secondary to uncovertebral arthrosis. Upper chest: Small left pleural effusion. Asymmetric soft tissue swelling is seen involving the visualized left anterior chest wall. Other: None IMPRESSION: No acute intracranial abnormality. No calvarial fracture. Mild right supraorbital soft tissue swelling. No acute fracture or listhesis of the cervical spine. Asymmetric left anterior chest wall soft tissue swelling, incompletely evaluated on this examination. Small left pleural effusion also identified. Electronically Signed   By: Fidela Salisbury MD   On: 05/15/2020 00:08   MR BRAIN WO CONTRAST  Result Date: 05/15/2020 CLINICAL DATA:  80 year old female status post fall. Unexplained altered mental status. EXAM: MRI HEAD WITHOUT CONTRAST TECHNIQUE: Multiplanar, multiecho pulse sequences of the brain and surrounding structures were obtained without intravenous contrast. COMPARISON:  Head CT 2335 hours yesterday.  Head CT 10/02/2017. FINDINGS: Study is intermittently degraded by motion artifact despite repeated imaging attempts. Diagnostic axial T1 weighted imaging could not be obtained. Brain: No restricted diffusion is evident. No midline shift, mass effect, or evidence of intracranial mass lesion. No ventriculomegaly. Normal basilar cisterns. Cervicomedullary junction appears negative. Patchy and confluent bilateral cerebral white matter T2 and FLAIR hyperintensity. Linear T2 and FLAIR hyperintensity in the right basal ganglia compatible with chronic lacunar infarct mild similar left basal ganglia signal changes. No cortical encephalomalacia or chronic cerebral blood products are evident. Brainstem and cerebellum appear relatively negative for age. Vascular: Major intracranial vascular flow voids are grossly preserved. Skull and upper cervical spine: Visualized bone marrow signal is within normal limits. Cervical spine obscured. Sinuses/Orbits: Postoperative  changes to both globes. Paranasal Visualized paranasal sinuses and mastoids are stable and well pneumatized. Other: None. IMPRESSION: 1. Degraded by motion. No acute intracranial abnormality identified. 2. Signal changes compatible with moderate for age chronic small vessel disease. Electronically Signed   By: Genevie Ann M.D.   On: 05/15/2020 06:55   US RENAL  Result Date: 05/15/2020 CLINICAL DATA:  Acute renal failure EXAM: RENAL / URINARY TRACT ULTRASOUND COMPLETE COMPARISON:  Renal ultrasound November 21, 2014 FINDINGS: Right Kidney: Renal measurements: 9.5 x 4.5 x 5.5 cm = volume: 137 mL. Echogenicity and renal cortical thickness are within normal limits. No mass or perinephric fluid. There is mild fullness of the right renal collecting system. No sonographically demonstrable calculus or ureterectasis. Visualized. Left Kidney: Renal measurements: 8.5 x 4.9 x 5.1 cm = volume: 111 mL. Echogenicity and renal cortical thickness are within normal limits. No mass, perinephric fluid, or hydronephrosis visualized. No sonographically demonstrable calculus or ureterectasis. Bladder: Appears normal for degree of bladder distention. Other: None. IMPRESSION: 1. Kidneys rather small, particularly on the left, a finding that may be a function of age but also may be a function of medical renal disease. Renal echogenicity and cortical thickness normal. 2. There is mild fullness of the right renal collecting system without obstructing focus evident. No collecting system fullness on the left. No evident renal mass on either side. Electronically Signed   By: Lowella Grip III M.D.   On: 05/15/2020 09:10   DG Chest Portable 1 View  Result Date: 05/14/2020 CLINICAL DATA:  Fall, found unresponsive EXAM: PORTABLE CHEST 1 VIEW COMPARISON:  Radiograph 07/18/2019 FINDINGS: Enlarged, lobular appearance of the  cardiac silhouette, can reflect cardiomegaly or pericardial effusion. The aorta is calcified. The remaining  cardiomediastinal contours are unremarkable. Some hazy basilar predominant opacities may reflect atelectasis and or early interstitial edema given some vascular congestion with fissural and septal thickening. No pneumothorax. No visible layering effusion. No acute traumatic abnormality of the chest wall. Degenerative changes present in the shoulders and spine. Telemetry leads overlie the chest. IMPRESSION: 1. Enlarged, lobular appearance of the cardiac silhouette, can reflect cardiomegaly or pericardial effusion. 2. Some hazy basilar predominant opacities may reflect atelectasis and/or early interstitial edema given some vascular congestion with fissural and septal thickening. Electronically Signed   By: Lovena Le M.D.   On: 05/14/2020 23:34     Labs:   Basic Metabolic Panel: Recent Labs  Lab 05/16/20 0013 05/17/20 0102 05/18/20 0105 05/19/20 0023 05/20/20 0435 05/21/20 0453  NA 135 136 136 137 136 136  K 4.9 4.4 4.4 4.1 3.7 3.8  CL 106 108 108 107 106 105  CO2 18* 18* 20* 20* 21* 22  GLUCOSE 130* 130* 117* 114* 115* 106*  BUN 76* 75* 75* 74* 77* 79*  CREATININE 4.20* 4.26* 4.27* 4.08* 3.80* 3.89*  CALCIUM 9.3 9.0 9.2 9.1 9.0 8.7*  MG 2.1 1.9 2.1 2.0 1.7  --   PHOS 5.0* 4.5 4.4 3.8 3.6  --    GFR Estimated Creatinine Clearance: 12.2 mL/min (A) (by C-G formula based on SCr of 3.89 mg/dL (H)). Liver Function Tests: Recent Labs  Lab 05/14/20 2317 05/15/20 0637 05/16/20 0013  AST 99* 98* 109*  ALT 46* 48* 52*  ALKPHOS 102 90 97  BILITOT 1.8* 1.6* 1.3*  PROT 6.3* 6.0* 5.6*  ALBUMIN 3.4* 3.0* 3.0*   No results for input(s): LIPASE, AMYLASE in the last 168 hours. No results for input(s): AMMONIA in the last 168 hours. Coagulation profile Recent Labs  Lab 05/14/20 2317 05/15/20 0637  INR 1.3* 1.3*    CBC: Recent Labs  Lab 05/14/20 2317 05/14/20 2338 05/17/20 0102 05/18/20 0105 05/19/20 0023 05/20/20 0435 05/21/20 0453  WBC 6.1   < > 5.8 6.7 6.0 7.6 7.3   NEUTROABS 2.7  --   --   --   --   --   --   HGB 12.6   < > 11.2* 12.0 12.1 10.9* 10.4*  HCT 40.2   < > 32.8* 35.6* 35.1* 31.1* 29.6*  MCV 102.0*   < > 95.9 95.4 93.1 92.0 91.1  PLT 80*   < > 66* 63* 67* 61* 61*   < > = values in this interval not displayed.   Cardiac Enzymes: Recent Labs  Lab 05/14/20 2331  CKTOTAL 241*   BNP: Invalid input(s): POCBNP CBG: Recent Labs  Lab 05/14/20 2317  GLUCAP 134*   D-Dimer No results for input(s): DDIMER in the last 72 hours. Hgb A1c No results for input(s): HGBA1C in the last 72 hours. Lipid Profile No results for input(s): CHOL, HDL, LDLCALC, TRIG, CHOLHDL, LDLDIRECT in the last 72 hours. Thyroid function studies No results for input(s): TSH, T4TOTAL, T3FREE, THYROIDAB in the last 72 hours.  Invalid input(s): FREET3 Anemia work up No results for input(s): VITAMINB12, FOLATE, FERRITIN, TIBC, IRON, RETICCTPCT in the last 72 hours. Microbiology Recent Results (from the past 240 hour(s))  Urine culture     Status: None   Collection Time: 05/14/20 11:18 PM   Specimen: Urine, Random  Result Value Ref Range Status   Specimen Description URINE, RANDOM  Final   Special Requests NONE  Final   Culture   Final    NO GROWTH Performed at [facility], [place] [address], [place], [place] [address]    Report Status 05/16/2020 FINAL  Final  Culture, blood (Routine x 2)     Status: Abnormal   Collection Time: 05/14/20 11:27 PM   Specimen: BLOOD  Result Value Ref Range Status   Specimen Description BLOOD LEFT ANTECUBITAL  Final   Special Requests   Final    BOTTLES DRAWN AEROBIC AND ANAEROBIC Blood Culture adequate volume   Culture  Setup Time   Final    GRAM POSITIVE COCCI IN CLUSTERS AEROBIC BOTTLE ONLY CRITICAL RESULT CALLED TO, READ BACK BY AND VERIFIED WITH: T,JACKY PHARMD @0902  05/16/20 EB    Culture (A)  Final    STAPHYLOCOCCUS EPIDERMIDIS THE SIGNIFICANCE OF ISOLATING THIS ORGANISM FROM A SINGLE SET OF BLOOD CULTURES WHEN  MULTIPLE SETS ARE DRAWN IS UNCERTAIN. PLEASE NOTIFY THE MICROBIOLOGY DEPARTMENT WITHIN ONE WEEK IF SPECIATION AND SENSITIVITIES ARE REQUIRED. Performed at [facility], [place] [address] [place], [place] [address]    Report Status 05/18/2020 FINAL  Final  Blood Culture ID Panel (Reflexed)     Status: Abnormal   Collection Time: 05/14/20 11:27 PM  Result Value Ref Range Status   Enterococcus faecalis NOT DETECTED NOT DETECTED Final   Enterococcus Faecium NOT DETECTED NOT DETECTED Final   Listeria monocytogenes NOT DETECTED NOT DETECTED Final   Staphylococcus species DETECTED (A) NOT DETECTED Final    Comment: CRITICAL RESULT CALLED TO, READ BACK BY AND VERIFIED WITH: T,JACKY PHARMD @0902  05/16/20 EB    Staphylococcus aureus (BCID) NOT DETECTED NOT DETECTED Final   Staphylococcus epidermidis DETECTED (A) NOT DETECTED Final    Comment: Methicillin (oxacillin) resistant coagulase negative staphylococcus. Possible blood culture contaminant (unless isolated from more than one blood culture draw or clinical case suggests pathogenicity). No antibiotic treatment is indicated for blood  culture contaminants. CRITICAL RESULT CALLED TO, READ BACK BY AND VERIFIED WITH: T,JACKY PHARMD @0902  05/16/20 EB    Staphylococcus lugdunensis NOT DETECTED NOT DETECTED Final   Streptococcus species NOT DETECTED NOT DETECTED Final   Streptococcus agalactiae NOT DETECTED NOT DETECTED Final   Streptococcus pneumoniae NOT DETECTED NOT DETECTED Final   Streptococcus pyogenes NOT DETECTED NOT DETECTED Final   A.calcoaceticus-baumannii NOT DETECTED NOT DETECTED Final   Bacteroides fragilis NOT DETECTED NOT DETECTED Final   Enterobacterales NOT DETECTED NOT DETECTED Final   Enterobacter cloacae complex NOT DETECTED NOT DETECTED Final   Escherichia coli NOT DETECTED NOT DETECTED Final   Klebsiella aerogenes NOT DETECTED NOT DETECTED Final   Klebsiella oxytoca NOT DETECTED NOT DETECTED Final   Klebsiella pneumoniae  NOT DETECTED NOT DETECTED Final   Proteus species NOT DETECTED NOT DETECTED Final   Salmonella species NOT DETECTED NOT DETECTED Final   Serratia marcescens NOT DETECTED NOT DETECTED Final   Haemophilus influenzae NOT DETECTED NOT DETECTED Final   Neisseria meningitidis NOT DETECTED NOT DETECTED Final   Pseudomonas aeruginosa NOT DETECTED NOT DETECTED Final   Stenotrophomonas maltophilia NOT DETECTED NOT DETECTED Final   Candida albicans NOT DETECTED NOT DETECTED Final   Candida auris NOT DETECTED NOT DETECTED Final   Candida glabrata NOT DETECTED NOT DETECTED Final   Candida krusei NOT DETECTED NOT DETECTED Final   Candida parapsilosis NOT DETECTED NOT DETECTED Final   Candida tropicalis NOT DETECTED NOT DETECTED Final   Cryptococcus neoformans/gattii NOT DETECTED NOT DETECTED Final   Methicillin resistance mecA/C DETECTED (A) NOT  DETECTED Final    Comment: CRITICAL RESULT CALLED TO, READ BACK BY AND VERIFIED WITH: T,JACKY PHARMD @0902  05/16/20 EB Performed at [facility] [address] [place], [place] [address]   MRSA PCR Screening     Status: None   Collection Time: 05/15/20  2:28 AM   Specimen: Nasopharyngeal  Result Value Ref Range Status   MRSA by PCR NEGATIVE NEGATIVE Final    Comment:        The GeneXpert MRSA Assay (FDA approved for NASAL specimens only), is one component of a comprehensive MRSA colonization surveillance program. It is not intended to diagnose MRSA infection nor to guide or monitor treatment for MRSA infections. Performed at [facility], [place] [address], [place], [place] [address]   Culture, blood (Routine x 2)     Status: None   Collection Time: 05/15/20  2:34 AM   Specimen: BLOOD RIGHT HAND  Result Value Ref Range Status   Specimen Description BLOOD RIGHT HAND  Final   Special Requests   Final    BOTTLES DRAWN AEROBIC AND ANAEROBIC Blood Culture results may not be optimal due to an inadequate volume of blood received in culture  bottles   Culture   Final    NO GROWTH 5 DAYS Performed at [facility], [place] [address], [place],  [address]    Report Status 05/20/2020 FINAL  Final  Resp Panel by RT-PCR (Flu A&B, Covid) Nasopharyngeal Swab     Status: None   Collection Time: 05/15/20  4:24 AM   Specimen: Nasopharyngeal Swab; Nasopharyngeal(NP) swabs in vial transport medium  Result Value Ref Range Status   SARS Coronavirus 2 by RT PCR NEGATIVE NEGATIVE Final    Comment: (NOTE) SARS-CoV-2 target nucleic acids are NOT DETECTED.  The SARS-CoV-2 RNA is generally detectable in upper respiratory specimens during the acute phase of infection. The lowest concentration of SARS-CoV-2 viral copies this assay can detect is 138 copies/mL. A negative result does not preclude SARS-Cov-2 infection and should not be used as the sole basis for treatment or other patient management decisions. A negative result may occur with  improper specimen collection/handling, submission of specimen other than nasopharyngeal swab, presence of viral mutation(s) within the areas targeted by this assay, and inadequate number of viral copies(<138 copies/mL). A negative result must be combined with clinical observations, patient history, and epidemiological information. The expected result is Negative.  Fact Sheet for Patients:  EntrepreneurPulse.com.au  Fact Sheet for Healthcare Providers:  IncredibleEmployment.be  This test is no t yet approved or cleared by the [place] FDA and  has been authorized for detection and/or diagnosis of SARS-CoV-2 by FDA under an Emergency Use Authorization (EUA). This EUA will remain  in effect (meaning this test can be used) for the duration of the COVID-19 declaration under Section 564(b)(1) of the Act, 21 U.S.C.section 360bbb-3(b)(1), unless the authorization is terminated  or revoked sooner.       Influenza A by PCR NEGATIVE NEGATIVE Final   Influenza  B by PCR NEGATIVE NEGATIVE Final    Comment: (NOTE) The Xpert Xpress SARS-CoV-2/FLU/RSV plus assay is intended as an aid in the diagnosis of influenza from Nasopharyngeal swab specimens and should not be used as a sole basis for treatment. Nasal washings and aspirates are unacceptable for Xpert Xpress SARS-CoV-2/FLU/RSV testing.  Fact Sheet for Patients: EntrepreneurPulse.com.au  Fact Sheet for Healthcare Providers: IncredibleEmployment.be  This test is not yet approved or cleared by the [place] FDA  and has been authorized for detection and/or diagnosis of SARS-CoV-2 by FDA under an Emergency Use Authorization (EUA). This EUA will remain in effect (meaning this test can be used) for the duration of the COVID-19 declaration under Section 564(b)(1) of the Act, 21 U.S.C. section 360bbb-3(b)(1), unless the authorization is terminated or revoked.  Performed at [facility], [place] [address] [place], [place] [address]      Discharge Instructions:   Discharge Instructions     Diet - low sodium heart healthy   Complete by: As directed    Discharge instructions   Complete by: As directed    Follow-up with your primary care physician in 1 week.  Check blood work at that time.  Follow-up with palliative care at home for further care.  No overexertion.   Increase activity slowly   Complete by: As directed       Allergies as of 05/21/2020       Reactions   Lisinopril Cough        Medication List     STOP taking these medications    furosemide 40 MG tablet Commonly known as: LASIX       TAKE these medications    acetaminophen 500 MG tablet Commonly known as: TYLENOL Take 500 mg by mouth 2 (two) times daily.   allopurinol 100 MG tablet Commonly known as: ZYLOPRIM TAKE 2 TABLETS BY MOUTH DAILY   aspirin 81 MG EC tablet Take 1 tablet (81 mg total) by mouth daily. Swallow whole. Start taking on: May 22, 2020    atorvastatin 40 MG tablet Commonly known as: LIPITOR TAKE 1 TABLET BY MOUTH DAILY   donepezil 5 MG tablet Commonly known as: Aricept Take 1 tablet (5 mg total) by mouth at bedtime.   latanoprost 0.005 % ophthalmic solution Commonly known as: XALATAN Place 1 drop into both eyes at bedtime.   Torsemide 40 MG Tabs Take 40 mg by mouth 2 (two) times daily.   Vitamin B-12 2000 MCG Tbcr Take 1 tablet (2,000 mcg total) by mouth daily.        Follow-up Information     Burns, Claudina Lick, MD. Schedule an appointment as soon as possible for a visit.   Specialty: Internal Medicine Why: regular followup Contact information: [place] [address] [phone]         Minus Breeding, MD .   Specialty: Cardiology Contact information: [address] [place] [address] [phone]                  Time coordinating discharge: 39 minutes  Signed:  Killian Schwer  [facility] 05/21/2020, 11:16 AM

## 2020-05-21 NOTE — Progress Notes (Signed)
    Durable Medical Equipment  (From admission, onward)         Start     Ordered   05/21/20 1238  For home use only DME Hospital bed  Once       Question Answer Comment  Length of Need Lifetime   Patient has (list medical condition): CHF   The above medical condition requires: Patient requires the ability to reposition frequently   Head must be elevated greater than: 30 degrees   Bed type Semi-electric   Hoyer Lift Yes   Support Surface: Gel Overlay      05/21/20 1239

## 2020-05-22 LAB — BASIC METABOLIC PANEL
Anion gap: 10 (ref 5–15)
BUN: 83 mg/dL — ABNORMAL HIGH (ref 8–23)
CO2: 23 mmol/L (ref 22–32)
Calcium: 9 mg/dL (ref 8.9–10.3)
Chloride: 103 mmol/L (ref 98–111)
Creatinine, Ser: 3.8 mg/dL — ABNORMAL HIGH (ref 0.44–1.00)
GFR, Estimated: 11 mL/min — ABNORMAL LOW (ref >60)
Glucose, Bld: 109 mg/dL — ABNORMAL HIGH (ref 70–99)
Potassium: 4 mmol/L (ref 3.5–5.1)
Sodium: 136 mmol/L (ref 135–145)

## 2020-05-22 LAB — CBC
HCT: 28.9 % — ABNORMAL LOW (ref 36.0–46.0)
Hemoglobin: 10.3 g/dL — ABNORMAL LOW (ref 12.0–15.0)
MCH: 32.5 pg (ref 26.0–34.0)
MCHC: 35.6 g/dL (ref 30.0–36.0)
MCV: 91.2 fL (ref 80.0–100.0)
Platelets: 58 10*3/uL — ABNORMAL LOW (ref 150–400)
RBC: 3.17 MIL/uL — ABNORMAL LOW (ref 3.87–5.11)
RDW: 19.8 % — ABNORMAL HIGH (ref 11.5–15.5)
WBC: 6.9 10*3/uL (ref 4.0–10.5)
nRBC: 0.9 % — ABNORMAL HIGH (ref 0.0–0.2)

## 2020-05-22 NOTE — Progress Notes (Signed)
Patient ID: Erin Liu, female   DOB: [birth year], 80 y.o.   MRN: [id number] Plans noted to discharge patient home with palliative care service and transition to hospice if continues to deteriorate.  We will continue to reach out to the patient/daughter from [place] [facility] to offer assistance.  Please call if I can be of further assistance prior to her departure.  Elmarie Shiley MD [facility]. Office # [phone] Pager # [phone] 7:43 AM

## 2020-05-22 NOTE — Plan of Care (Signed)

## 2020-05-22 NOTE — TOC Transition Note (Signed)
Transition of Care [facility]) - CM/SW Discharge Note   Patient Details  Name: Erin Liu MRN: [id number] Date of Birth: 1940/07/07  Transition of Care [facility]) CM/SW Contact:  Bethann Berkshire, [place] Phone Number: 05/22/2020, 11:08 AM   Clinical Narrative:     Patient will DC to: Home w [facility] Anticipated DC date: 05/22/20 Family notified: Desmond Lope (Daughter)  [phone] (Mobile) Transport by: Corey Harold   Per MD patient ready for DC to Home with Home Health . DC packet on chart. Ambulance transport requested for patient.   CSW will sign off for now as social work intervention is no longer needed. Please consult [place] again if new needs arise.   Final next level of care: [place] Barriers to Discharge: Continued Medical Work up   Patient Goals and CMS Choice Patient states their goals for this hospitalization and ongoing recovery are:: daughter wants pt to go home with [facility] CMS Medicare.gov Compare Post Acute Care list provided to:: Patient Represenative (must comment) (Daughter) Choice offered to / list presented to : Adult Children  Discharge Placement                Patient to be transferred to facility by: [place] Name of family member notified: Desmond Lope (Daughter)   [phone] [facility]) Patient and family notified of of transfer: 05/22/20  Discharge Plan and Services     Post Acute Care Choice: Home Health                    HH Arranged: RN,PT,OT,Social Work [facility] Agency: [place] Date [facility] Agency Contacted: 05/20/20 Time [place]: 69 Representative spoke with at [place]: [place] (Rail[place]) Interventions     Readmission Risk Interventions No flowsheet data found.

## 2020-05-22 NOTE — Progress Notes (Addendum)
Patient was seen and examined at bedside.  Patient denies any pain, nausea vomiting fever chills.  No interval changes reported Vitals with BMI 05/22/2020 05/22/2020 05/21/2020  Height - - -  Weight - - -  BMI - - -  Systolic 894 834 758  Diastolic 65 69 71  Pulse 99 98 106  Patient was discharged on 05/21/2020 but did not have a hospital bed at home so discharge was pending.  Patient is stable at this time for disposition to home with palliative care set up.  Please refer to discharge instructions done on 05/21/2020.

## 2020-05-22 NOTE — Progress Notes (Signed)
PICC line removed by IV team. Purewick removed by NT and brief put on pt. Discharge summary given to PTAR. Pt belongings sent with daughter. PTAR to transport pt home.

## 2020-05-22 NOTE — Consult Note (Signed)
   [facility] CM Inpatient Consult   05/22/2020  SIMONE RODENBECK 10/26/1940 [id number]    [place] Organization [ACO] Patient: [facility] referral for additional support  Primary Care Provider: Dr. Billey Gosling, Pietro Cassis  Patient was screened for length of stay and chronic care needs for post hospital transition of care needs.  Patient is in an Embedded provider. Chart reviewed for post hospital support needs. Patient will have the transition of care call conducted by the primary care provider.  Spoke with daughter, Ms. Danne Baxter via phone number [phone], HIPAA verified, daughter states  regarding support needs for "getting things in place" .  Patient has been referred for home palliative and will alert Embedded RNCM for follow up as well. This patient is also in an Embedded practice which has a chronic care management Embedded Care Management team.  Patient had a referral for home palliative follow up as well.  Plan: Notification to be sent to the [place] Management and made aware of follow up needs/support.  Please contact for further questions,  Natividad Brood, RN BSN [facility] Liaison  [phone] business mobile phone Toll free office [phone]  Fax number: [phone] [email or web address]

## 2020-05-23 ENCOUNTER — Telehealth: Payer: Self-pay | Admitting: Internal Medicine

## 2020-05-23 ENCOUNTER — Telehealth: Payer: Self-pay

## 2020-05-23 NOTE — Telephone Encounter (Signed)
Patient was just recently seen in the hospital and was trying to schedule a hospital follow up, but patient is unable to walk at this time so they were wondering if it would be okay to do a virtual visit. She also has a home health nurse that could draw any labs if needed.

## 2020-05-23 NOTE — Telephone Encounter (Signed)
Spoke with patient's daughter [name] and scheduled an in-person Palliative Consult for 06/12/20 @ 9:30AM. Faythe Ghee per Windy Canny.   COVID screening was negative. No pets in home. Patient's son lives with her.  Consent obtained; updated Outlook/Netsmart/Team List and Epic.  Family is aware they may be receiving a call from NP the day before or day of to confirm appointment.

## 2020-05-23 NOTE — Telephone Encounter (Signed)
Yes, virtual ok

## 2020-05-23 NOTE — Telephone Encounter (Signed)
Appointment made

## 2020-05-24 DIAGNOSIS — I130 Hypertensive heart and chronic kidney disease with heart failure and stage 1 through stage 4 chronic kidney disease, or unspecified chronic kidney disease: Secondary | ICD-10-CM | POA: Diagnosis not present

## 2020-05-24 DIAGNOSIS — J9601 Acute respiratory failure with hypoxia: Secondary | ICD-10-CM | POA: Diagnosis not present

## 2020-05-24 DIAGNOSIS — A419 Sepsis, unspecified organism: Secondary | ICD-10-CM | POA: Diagnosis not present

## 2020-05-24 DIAGNOSIS — I5041 Acute combined systolic (congestive) and diastolic (congestive) heart failure: Secondary | ICD-10-CM | POA: Diagnosis not present

## 2020-05-24 DIAGNOSIS — R6520 Severe sepsis without septic shock: Secondary | ICD-10-CM | POA: Diagnosis not present

## 2020-05-24 DIAGNOSIS — N179 Acute kidney failure, unspecified: Secondary | ICD-10-CM | POA: Diagnosis not present

## 2020-05-24 DIAGNOSIS — E1122 Type 2 diabetes mellitus with diabetic chronic kidney disease: Secondary | ICD-10-CM | POA: Diagnosis not present

## 2020-05-24 DIAGNOSIS — D631 Anemia in chronic kidney disease: Secondary | ICD-10-CM | POA: Diagnosis not present

## 2020-05-24 DIAGNOSIS — N184 Chronic kidney disease, stage 4 (severe): Secondary | ICD-10-CM | POA: Diagnosis not present

## 2020-05-25 DIAGNOSIS — N179 Acute kidney failure, unspecified: Secondary | ICD-10-CM | POA: Diagnosis not present

## 2020-05-25 DIAGNOSIS — N184 Chronic kidney disease, stage 4 (severe): Secondary | ICD-10-CM | POA: Diagnosis not present

## 2020-05-25 DIAGNOSIS — A419 Sepsis, unspecified organism: Secondary | ICD-10-CM | POA: Diagnosis not present

## 2020-05-25 DIAGNOSIS — I130 Hypertensive heart and chronic kidney disease with heart failure and stage 1 through stage 4 chronic kidney disease, or unspecified chronic kidney disease: Secondary | ICD-10-CM | POA: Diagnosis not present

## 2020-05-25 DIAGNOSIS — E1122 Type 2 diabetes mellitus with diabetic chronic kidney disease: Secondary | ICD-10-CM | POA: Diagnosis not present

## 2020-05-25 DIAGNOSIS — R6520 Severe sepsis without septic shock: Secondary | ICD-10-CM | POA: Diagnosis not present

## 2020-05-25 DIAGNOSIS — D631 Anemia in chronic kidney disease: Secondary | ICD-10-CM | POA: Diagnosis not present

## 2020-05-25 DIAGNOSIS — I5041 Acute combined systolic (congestive) and diastolic (congestive) heart failure: Secondary | ICD-10-CM | POA: Diagnosis not present

## 2020-05-25 DIAGNOSIS — J9601 Acute respiratory failure with hypoxia: Secondary | ICD-10-CM | POA: Diagnosis not present

## 2020-05-26 ENCOUNTER — Encounter: Payer: Self-pay | Admitting: Internal Medicine

## 2020-05-26 NOTE — Progress Notes (Signed)
Virtual Visit via Video Note  I connected with INETTE Liu on 05/26/20 at  2:45 PM EDT by a video enabled telemedicine application and verified that I am speaking with the correct person using two identifiers.   I discussed the limitations of evaluation and management by telemedicine and the availability of in person appointments. The patient expressed understanding and agreed to proceed.  Present for the visit:  Myself, Dr Billey Gosling, Erin Liu and her daughter Erin Liu.  The patient is currently at home and I am in the office.    No referring provider.    History of Present Illness: She is here for follow up from her hospitalization.  Her daughter provides the history.  Admitted 05/14/20 - 05/21/20  Needs cbc, bmp, magnesium  Follow up with palliative care, consider hospice if condition worsens  She fell at home and was unresponsive.  In the ED she was hypothermic and hypotensive.  She was given IVF, bair hugger.  Ct of head and neck showed no acute findings.  Cr was elevated to 4.4 (baseline 2.6).  Troponin was elevated.   Cardiology consulted.  She denied CP.  She had severe sys cardiomyopathy and inotropes were initiated.  Palliative care was consulted.  Family requested d/c to home with home health initially followed by PACE program and possible palliative care.    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  Nephrology consulted.  Initially on inotropes.  No plan for HD.   Acute sys and dia HF w/ cardiogenic shock - LV EF 20-25% w pulm art htn.  Received miirinone drip, IV lasix  Severe sepsis likely secondary to CAP w acute hypoxic resp failure - completed rocephin, doxycyline.  On RA.  Culture negative.  Wide complex tachycardia - likely d/t aflutter.  Could not start BB w/ cardiogenic shock.  No a/c due to palliative level of care  Hypotension - d/t sys CHF  elevated troponin, likely NSTEMI - no further eval due to comorbid status.   EF 79-89%  Acute metabolic encephalopathy - resolved.  D/t AKI, sepsis. Ct of head w/o acute findings.  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  Has mobile nurse.      She is taking all of her medications as prescribed.  She is eating, but not like she was.  She has [name] PT and nurse coming.  PT had her up and walking over the weekend.  She will has a SW and HHA coming out once a week.   Her left arm and hand are a little swollen. It was a little red a few days ago.  He does not feel warm currently.  Daughter plans on getting her involved in the pace program to provide more assistance at home.   Review of Systems  Constitutional: Negative for chills and fever.  Respiratory: Positive for cough (dry cough here and there). Negative for shortness of breath and wheezing.   Cardiovascular: Positive for leg swelling (minimal - improved). Negative for chest pain and palpitations.  Gastrointestinal: Negative for abdominal pain, heartburn and nausea.  Genitourinary: Negative for dysuria.  Musculoskeletal:       Left leg pain near ankle - ? Pain from fall out of bed  Neurological: Negative for dizziness and headaches.      Social History   Socioeconomic History  . Marital status: Widowed    Spouse name: Not on file  .  Number of children: 3  . Years of education: Not on file  . Highest education level: Not on file  Occupational History  . Not on file  Tobacco Use  . Smoking status: Never Smoker  . Smokeless tobacco: Never Used  Vaping Use  . Vaping Use: Never used  Substance and Sexual Activity  . Alcohol use: No  . Drug use: No  . Sexual activity: Not Currently  Other Topics Concern  . Not on file  Social History Narrative   Widowed, lives with son. Retired Medical illustrator   Social Determinants of Radio broadcast assistant Strain: [place]   . Difficulty of Paying Living Expenses: Not hard at all  Food Insecurity: No Food  Insecurity  . Worried About Charity fundraiser in the Last Year: Never true  . Ran Out of Food in the Last Year: Never true  Transportation Needs: No Transportation Needs  . Lack of Transportation (Medical): No  . Lack of Transportation (Non-Medical): No  Physical Activity: Inactive  . Days of Exercise per Week: 0 days  . Minutes of Exercise per Session: 0 min  Stress: No Stress Concern Present  . Feeling of Stress : Not at all  Social Connections: Unknown  . Frequency of Communication with Friends and Family: More than three times a week  . Frequency of Social Gatherings with Friends and Family: Once a week  . Attends Religious Services: Patient refused  . Active Member of Clubs or Organizations: Patient refused  . Attends Archivist Meetings: Patient refused  . Marital Status: Widowed     Observations/Objective: Appears well in NAD Breathing normally  Assessment and Plan:  See Problem List for Assessment and Plan of chronic medical problems.   Follow Up Instructions:    I discussed the assessment and treatment plan with the patient. The patient was provided an opportunity to ask questions and all were answered. The patient agreed with the plan and demonstrated an understanding of the instructions.   The patient was advised to call back or seek an in-person evaluation if the symptoms worsen or if the condition fails to improve as anticipated.    Binnie Rail, MD

## 2020-05-27 ENCOUNTER — Telehealth (INDEPENDENT_AMBULATORY_CARE_PROVIDER_SITE_OTHER): Payer: Medicare HMO | Admitting: Internal Medicine

## 2020-05-27 DIAGNOSIS — R413 Other amnesia: Secondary | ICD-10-CM | POA: Diagnosis not present

## 2020-05-27 DIAGNOSIS — IMO0002 Reserved for concepts with insufficient information to code with codable children: Secondary | ICD-10-CM

## 2020-05-27 DIAGNOSIS — M109 Gout, unspecified: Secondary | ICD-10-CM

## 2020-05-27 DIAGNOSIS — E1329 Other specified diabetes mellitus with other diabetic kidney complication: Secondary | ICD-10-CM | POA: Diagnosis not present

## 2020-05-27 DIAGNOSIS — E785 Hyperlipidemia, unspecified: Secondary | ICD-10-CM

## 2020-05-27 DIAGNOSIS — J189 Pneumonia, unspecified organism: Secondary | ICD-10-CM | POA: Diagnosis not present

## 2020-05-27 DIAGNOSIS — I214 Non-ST elevation (NSTEMI) myocardial infarction: Secondary | ICD-10-CM

## 2020-05-27 DIAGNOSIS — R5381 Other malaise: Secondary | ICD-10-CM | POA: Diagnosis not present

## 2020-05-27 DIAGNOSIS — N184 Chronic kidney disease, stage 4 (severe): Secondary | ICD-10-CM

## 2020-05-27 DIAGNOSIS — G9341 Metabolic encephalopathy: Secondary | ICD-10-CM | POA: Diagnosis not present

## 2020-05-27 DIAGNOSIS — E1365 Other specified diabetes mellitus with hyperglycemia: Secondary | ICD-10-CM

## 2020-05-27 NOTE — Assessment & Plan Note (Signed)
Chronic Continue atorvastatin 40 mg daily 

## 2020-05-27 NOTE — Assessment & Plan Note (Signed)
Chronic Recent AKI on CKD, which improved slightly before leaving the hospital Recheck CMP-we will ask home health nurse to draw blood Following with nephrology Per daughter she is eating some and drinking and seems to be improving since her hospitalization Continue torsemide 40 mg twice daily

## 2020-05-27 NOTE — Assessment & Plan Note (Signed)
Chronic No current gout symptoms Continue allopurinol 200 mg daily

## 2020-05-27 NOTE — Assessment & Plan Note (Signed)
Chronic Diet controlled Lab Results  Component Value Date   HGBA1C 5.4 01/21/2020   HGBA1C 5.4 01/21/2020   HGBA1C 5.4 (A) 01/21/2020   HGBA1C 5.4 01/21/2020

## 2020-05-27 NOTE — Assessment & Plan Note (Signed)
Completed treatment Has a slight cough, but that is not new and is not getting worse No shortness of breath, wheeze or fevers

## 2020-05-27 NOTE — Assessment & Plan Note (Signed)
Resolved Secondary to AKI and sepsis from community-acquired pneumonia Per daughter she is back at her baseline and there is no confusion

## 2020-05-27 NOTE — Assessment & Plan Note (Signed)
Elevated troponin in the hospital-likely NSTEMI EF was 20-25% and she did experience cardiogenic shock, which improved with inotropic's and diuresis No further evaluation or medications because of her comorbid status, which may need to be reevaluated depending on her condition

## 2020-05-27 NOTE — Assessment & Plan Note (Signed)
Acute on chronic Had physical deconditioning prior to her hospitalization, but at this point it is worse She has home health PT coming

## 2020-05-27 NOTE — Assessment & Plan Note (Signed)
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

## 2020-05-28 ENCOUNTER — Telehealth: Payer: Self-pay | Admitting: Internal Medicine

## 2020-05-28 DIAGNOSIS — A419 Sepsis, unspecified organism: Secondary | ICD-10-CM | POA: Diagnosis not present

## 2020-05-28 DIAGNOSIS — R6520 Severe sepsis without septic shock: Secondary | ICD-10-CM | POA: Diagnosis not present

## 2020-05-28 DIAGNOSIS — D631 Anemia in chronic kidney disease: Secondary | ICD-10-CM | POA: Diagnosis not present

## 2020-05-28 DIAGNOSIS — N184 Chronic kidney disease, stage 4 (severe): Secondary | ICD-10-CM | POA: Diagnosis not present

## 2020-05-28 DIAGNOSIS — E1122 Type 2 diabetes mellitus with diabetic chronic kidney disease: Secondary | ICD-10-CM | POA: Diagnosis not present

## 2020-05-28 DIAGNOSIS — J9601 Acute respiratory failure with hypoxia: Secondary | ICD-10-CM | POA: Diagnosis not present

## 2020-05-28 DIAGNOSIS — N179 Acute kidney failure, unspecified: Secondary | ICD-10-CM | POA: Diagnosis not present

## 2020-05-28 DIAGNOSIS — I5041 Acute combined systolic (congestive) and diastolic (congestive) heart failure: Secondary | ICD-10-CM | POA: Diagnosis not present

## 2020-05-28 DIAGNOSIS — I130 Hypertensive heart and chronic kidney disease with heart failure and stage 1 through stage 4 chronic kidney disease, or unspecified chronic kidney disease: Secondary | ICD-10-CM | POA: Diagnosis not present

## 2020-05-28 NOTE — Telephone Encounter (Signed)
Verbals given today. °

## 2020-05-28 NOTE — Telephone Encounter (Signed)
ok 

## 2020-05-28 NOTE — Telephone Encounter (Signed)
[facility] is requesting verbals for OT for 1w3, 2w4.   Okay to LVM: [phone]

## 2020-05-29 ENCOUNTER — Telehealth: Payer: Self-pay | Admitting: *Deleted

## 2020-05-29 ENCOUNTER — Encounter: Payer: Self-pay | Admitting: Internal Medicine

## 2020-05-29 DIAGNOSIS — E1122 Type 2 diabetes mellitus with diabetic chronic kidney disease: Secondary | ICD-10-CM | POA: Diagnosis not present

## 2020-05-29 DIAGNOSIS — N184 Chronic kidney disease, stage 4 (severe): Secondary | ICD-10-CM | POA: Diagnosis not present

## 2020-05-29 DIAGNOSIS — N189 Chronic kidney disease, unspecified: Secondary | ICD-10-CM | POA: Diagnosis not present

## 2020-05-29 DIAGNOSIS — N179 Acute kidney failure, unspecified: Secondary | ICD-10-CM | POA: Diagnosis not present

## 2020-05-29 DIAGNOSIS — A419 Sepsis, unspecified organism: Secondary | ICD-10-CM | POA: Diagnosis not present

## 2020-05-29 DIAGNOSIS — D631 Anemia in chronic kidney disease: Secondary | ICD-10-CM | POA: Diagnosis not present

## 2020-05-29 DIAGNOSIS — I5041 Acute combined systolic (congestive) and diastolic (congestive) heart failure: Secondary | ICD-10-CM | POA: Diagnosis not present

## 2020-05-29 DIAGNOSIS — J9601 Acute respiratory failure with hypoxia: Secondary | ICD-10-CM | POA: Diagnosis not present

## 2020-05-29 DIAGNOSIS — I130 Hypertensive heart and chronic kidney disease with heart failure and stage 1 through stage 4 chronic kidney disease, or unspecified chronic kidney disease: Secondary | ICD-10-CM | POA: Diagnosis not present

## 2020-05-29 DIAGNOSIS — R6520 Severe sepsis without septic shock: Secondary | ICD-10-CM | POA: Diagnosis not present

## 2020-05-29 NOTE — Chronic Care Management (AMB) (Signed)
  Chronic Care Management   Note  05/29/2020 Name: Erin Liu MRN: [id number] DOB: 1940-10-28  Erin Liu is a 80 y.o. year old female who is a primary care patient of Burns, Claudina Lick, MD. I reached out to Erin Liu by phone today in response to a referral sent by Ms. Marylynn Pearson Schmieder's PCP, Dr. Quay Burow.     Ms. Gillentine was given information about Chronic Care Management services today including:  1. CCM service includes personalized support from designated clinical staff supervised by her physician, including individualized plan of care and coordination with other care providers 2. 24/7 contact phone numbers for assistance for urgent and routine care needs. 3. Service will only be billed when office clinical staff spend 20 minutes or more in a month to coordinate care. 4. Only one practitioner may furnish and bill the service in a calendar month. 5. The patient may stop CCM services at any time (effective at the end of the month) by phone call to the office staff. 6. The patient will be responsible for cost sharing (co-pay) of up to 20% of the service fee (after annual deductible is met).  Patient daughter Erin Liu DPR on file verbally agreed to assistance and services provided by embedded care coordination/care management team today.  Follow up plan: Telephone appointment with care management team member scheduled for:06/02/2020  [place] Management

## 2020-05-30 ENCOUNTER — Telehealth: Payer: Self-pay | Admitting: Internal Medicine

## 2020-05-30 DIAGNOSIS — J9601 Acute respiratory failure with hypoxia: Secondary | ICD-10-CM | POA: Diagnosis not present

## 2020-05-30 DIAGNOSIS — E1122 Type 2 diabetes mellitus with diabetic chronic kidney disease: Secondary | ICD-10-CM | POA: Diagnosis not present

## 2020-05-30 DIAGNOSIS — N184 Chronic kidney disease, stage 4 (severe): Secondary | ICD-10-CM | POA: Diagnosis not present

## 2020-05-30 DIAGNOSIS — I5041 Acute combined systolic (congestive) and diastolic (congestive) heart failure: Secondary | ICD-10-CM | POA: Diagnosis not present

## 2020-05-30 DIAGNOSIS — R6520 Severe sepsis without septic shock: Secondary | ICD-10-CM | POA: Diagnosis not present

## 2020-05-30 DIAGNOSIS — A419 Sepsis, unspecified organism: Secondary | ICD-10-CM | POA: Diagnosis not present

## 2020-05-30 DIAGNOSIS — D631 Anemia in chronic kidney disease: Secondary | ICD-10-CM | POA: Diagnosis not present

## 2020-05-30 DIAGNOSIS — N179 Acute kidney failure, unspecified: Secondary | ICD-10-CM | POA: Diagnosis not present

## 2020-05-30 DIAGNOSIS — I130 Hypertensive heart and chronic kidney disease with heart failure and stage 1 through stage 4 chronic kidney disease, or unspecified chronic kidney disease: Secondary | ICD-10-CM | POA: Diagnosis not present

## 2020-05-30 NOTE — Telephone Encounter (Signed)
Called Palliative care today and spoke with [facility]. She will reach out to NP to see about getting patient worked in for Monday.

## 2020-05-30 NOTE — Telephone Encounter (Signed)
When she left the hospital her kidneys were not working well and her heart was also not working well.  I am concerned that her low blood pressure could indicate that her heart and kidneys are getting worse.  Again, this is very difficult to know without seeing her, doing blood work and other tests.  I do think they should consider having palliative care coming have someone come to the house and evaluate her more thoroughly.

## 2020-05-30 NOTE — Telephone Encounter (Signed)
Erin Liu called and said that the labs that were drawn yesterday are  BUN-103 Creatinine-4.05 Sodium-129  Please advise    Phone: [phone]

## 2020-05-30 NOTE — Telephone Encounter (Signed)
Sree a PT with [facility] calling, states that the patients BP is 90/60 and she is feeling drowsy. If any changes need to be made reach out to patients family

## 2020-05-30 NOTE — Telephone Encounter (Signed)
Patients daughter, Erin Liu called and said when she was giving the patient a bath this morning she notices her left breast was enlarged. The home health nurse told her to contact PCP. She can be reached at [phone]. Please advise

## 2020-05-30 NOTE — Telephone Encounter (Signed)
Spoke with daughter today 

## 2020-05-30 NOTE — Telephone Encounter (Signed)
Should be seen sooner than later by palliative care if possible

## 2020-05-30 NOTE — Telephone Encounter (Signed)
It is hard to say what the cause is without seeing her and doing some tests.    It could be a breast issue, but more than likely it is related to her heart or something inside her chest.

## 2020-06-02 ENCOUNTER — Other Ambulatory Visit: Payer: Self-pay

## 2020-06-02 ENCOUNTER — Other Ambulatory Visit: Payer: Medicare HMO | Admitting: Hospice

## 2020-06-02 ENCOUNTER — Telehealth: Payer: Medicare HMO

## 2020-06-02 ENCOUNTER — Telehealth: Payer: Self-pay | Admitting: *Deleted

## 2020-06-02 DIAGNOSIS — J9601 Acute respiratory failure with hypoxia: Secondary | ICD-10-CM | POA: Diagnosis not present

## 2020-06-02 DIAGNOSIS — I130 Hypertensive heart and chronic kidney disease with heart failure and stage 1 through stage 4 chronic kidney disease, or unspecified chronic kidney disease: Secondary | ICD-10-CM | POA: Diagnosis not present

## 2020-06-02 DIAGNOSIS — R6520 Severe sepsis without septic shock: Secondary | ICD-10-CM | POA: Diagnosis not present

## 2020-06-02 DIAGNOSIS — A419 Sepsis, unspecified organism: Secondary | ICD-10-CM | POA: Diagnosis not present

## 2020-06-02 DIAGNOSIS — I5041 Acute combined systolic (congestive) and diastolic (congestive) heart failure: Secondary | ICD-10-CM | POA: Diagnosis not present

## 2020-06-02 DIAGNOSIS — N179 Acute kidney failure, unspecified: Secondary | ICD-10-CM | POA: Diagnosis not present

## 2020-06-02 DIAGNOSIS — D631 Anemia in chronic kidney disease: Secondary | ICD-10-CM | POA: Diagnosis not present

## 2020-06-02 DIAGNOSIS — E1122 Type 2 diabetes mellitus with diabetic chronic kidney disease: Secondary | ICD-10-CM | POA: Diagnosis not present

## 2020-06-02 DIAGNOSIS — F0390 Unspecified dementia without behavioral disturbance: Secondary | ICD-10-CM | POA: Diagnosis not present

## 2020-06-02 DIAGNOSIS — Z515 Encounter for palliative care: Secondary | ICD-10-CM | POA: Diagnosis not present

## 2020-06-02 DIAGNOSIS — N184 Chronic kidney disease, stage 4 (severe): Secondary | ICD-10-CM | POA: Diagnosis not present

## 2020-06-02 DIAGNOSIS — R531 Weakness: Secondary | ICD-10-CM | POA: Diagnosis not present

## 2020-06-02 NOTE — Telephone Encounter (Signed)
  Chronic Care Management   Outreach Note  06/02/2020 Name: Erin Liu MRN: [id number] DOB: [birth year]  Referred by: Binnie Rail, MD Reason for referral : Chronic Care Management (CCM RN CM Initial outreach- visit re-scheduled; DM, CHF, CKD)  An unsuccessful telephone outreach for completion of initial RN CM assessment was attempted today with patient's caregiver/ daughter, Desmond Lope. The patient was referred to the case management team for assistance with care management and care coordination.  [name] reports that the [place] team is scheduled to arrive this afternoon "at any time;" we re-scheduled today's telephone visit for tomorrow morning.  Follow Up Plan:   Telephone follow up appointment with care management team member scheduled for: tomorrow, Tuesday June 03, 2020 between 11- 11:30 am  The patient's caregiver has been provided with contact information for the care management team and has been advised to call with any health related questions or concerns.   Oneta Rack, RN, BSN, [facility] RN Care Coordination- [place] [phone]: direct office [phone]: mobile

## 2020-06-02 NOTE — Progress Notes (Addendum)
PATIENT NAME: Erin Liu [id number] [phone] (home)  DOB: [birth year] MRN: [id number]  PRIMARY CARE PROVIDER:    Binnie Rail, MD,  Erin Liu [id number] [phone]  REFERRING PROVIDER:   Binnie Rail, MD [place],  [place] [address] [phone]  RESPONSIBLE PARTY:  [name] Emergency contcat: Erin Liu [phone] Erin Liu [phone]  CHIEF COMPLAIN: Initial palliative care visit/weakness  Visit is to build trust and highlight Palliative Medicine as specialized medical care for people living with serious illness, aimed at facilitating better quality of life through symptoms relief, assisting with advance care plan and establishing goals of care.  Erin Liu was present with patient during visit. Discussion on the difference between Palliative and Hospice care.  Family not interested in hospice service at this time. Visit consisted of counseling and education dealing with the complex and emotionally intense issues of symptom management and palliative care in the setting of serious and potentially life-threatening illness. Spirituality and affiliation with a local church help patient/family to cope.  Palliative care team will continue to support patient, patient's family, and medical team.  RECOMMENDATIONS/PLAN:   Advance Care Planning: Our advance care planning conversation included a discussion about  the value and importance of advance care planning, exploration of goals of care in the event of a sudden injury or illness, identification and preparation of a healthcare agent.  CODE STATUS: Patient is a Full code. Education provided that patient/family can change the status in the future as desired.   GOALS OF CARE: Goals of care include to maximize quality of life and symptom management.   I spent 46  minutes providing this initial consultation. More than 50% of the time in this consultation was  spent on counseling patient and coordinating communication. -------------------------------------------------------------------------------------------------------------------------------------- Symptom Management/Plan:  Weakness: Continue PT/OT.  Balance of rest and performance activity.  Encourage adequate oral intake. Appetite is improving. Continue to offer 4-6 small meals daily incorporating fruits and vegetables. Offer her choice foods/soups; avoid added salt.  Gait disturbance: Falls precautions. Continue PT/OT.  Dementia: Patient is mostly bed-bound, incontinent of bladder,FAST 6C, FLACC 0.  Continue Aricept.  Encourage reminiscence/recalls; encourage puzzles word search.,  Approach and redirection as needed. Patient is followed by Nephrologist for stage IV CKD- Dr Posey Pronto, next visit in May 2022.  Palliative will continue to monitor for symptom management/decline and make recommendations as needed. Return 6 weeks or prn. Encouraged to call provider sooner with any concerns.   PPS: weak 30%  Family /Caregiver/Community Supports: Patient lives at home with her son. Erin Liu reported plans in advance stage for PACE services.  Plan is to get patient into PACE by Jul 06 2020. Strong family support system identified. [place], SW are also seeing patient with their services.     HISTORY OF PRESENT ILLNESS:  Erin Liu is a 80 y.o. female with multiple medical problems including generalized body weakness which has been ongoing since Feb 2022, worse in the past 2 weeks and especially during last hospitalization. Last hospitalization 3/9-3/16/2022 for renal failure, acute on chronic.  During hospitalization patient showed significantly elevated troponin likely NSTEMI; no further ischemic work-up was done due to comorbid status.  2D echocardiogram showed left ventricular ejection fraction of 20 to 25%.  Patient was treated/stabilized in the hospital and discharged home with home health services.   Ongoing weakness results in compromised physiological reserve, impairing her independence and overall function;  improving with ongoing physical therapy. Patient denies pain/discomfort. History of CKD stage IV, Type 2 DM, HTN , Dementia.  Erin Liu reported dialysis not recommended by her nephrologist because of patient's advanced age and comorbidities. History obtained from review of EMR, discussion with patient/family.   Review and summarization of Epic records shows history from other than patient. Rest of 10 point ROS asked and negative.  Palliative Care was asked to follow this patient by consultation request of Burns, Claudina Lick, MD to help address complex decision making in the context of advance care planning and goals of care clarification.    HOSPICE ELIGIBILITY/DIAGNOSIS: TBD  PAST MEDICAL HISTORY:  Past Medical History:  Diagnosis Date  . ANEMIA, NORMOCYTIC   . Arthritis    back  . BREAST CYST, HX OF   . CARPAL TUNNEL SYNDROME, BILATERAL   . DIABETES MELLITUS, TYPE II   . Glaucoma   . HYPERLIPIDEMIA   . HYPERTENSION   . OBESITY   . PERIPHERAL NEUROPATHY   . RENAL FAILURE, CHRONIC    Stage 4     SOCIAL HX: Patient lives at home with her son. Social History   Tobacco Use  . Smoking status: Never Smoker  . Smokeless tobacco: Never Used  Substance Use Topics  . Alcohol use: No    FAMILY HX:  Family History  Problem Relation Age of Onset  . Arthritis Son   . Diabetes Son   . Hypertension Son   . Colon cancer Son 68  . Neuropathy Son        chemotherapy related  . Heart attack Mother        Died age 37s  . Heart attack Father        No details 57s  . Heart disease Sister        Slight heart attack, died age 94    Review of lab tests/diagnostics   Results for Erin Liu (MRN [id number]) as of 06/02/2020 15:00  Ref. Range 05/21/2020 04:53  Total hemoglobin Latest Ref Range: 12.0 - 16.0 g/dL 11.0 (L)  Carboxyhemoglobin Latest Ref Range: 0.5 - 1.5 % 1.3   Methemoglobin Latest Ref Range: 0.0 - 1.5 % 1.1  O2 Saturation Latest Units: % 40.9  BASIC METABOLIC PANEL Unknown Rpt (A)  Sodium Latest Ref Range: 135 - 145 mmol/L 136  Potassium Latest Ref Range: 3.5 - 5.1 mmol/L 3.8  Chloride Latest Ref Range: 98 - 111 mmol/L 105  CO2 Latest Ref Range: 22 - 32 mmol/L 22  Glucose Latest Ref Range: 70 - 99 mg/dL 106 (H)  BUN Latest Ref Range: 8 - 23 mg/dL 79 (H)  Creatinine Latest Ref Range: 0.44 - 1.00 mg/dL 3.89 (H)  Calcium Latest Ref Range: 8.9 - 10.3 mg/dL 8.7 (L)  Anion gap Latest Ref Range: 5 - 15  9  GFR, Estimated Latest Ref Range: >60 mL/min 11 (L)  WBC Latest Ref Range: 4.0 - 10.5 K/uL 7.3  RBC Latest Ref Range: 3.87 - 5.11 MIL/uL 3.25 (L)  Hemoglobin Latest Ref Range: 12.0 - 15.0 g/dL 10.4 (L)  HCT Latest Ref Range: 36.0 - 46.0 % 29.6 (L)  MCV Latest Ref Range: 80.0 - 100.0 fL 91.1  MCH Latest Ref Range: 26.0 - 34.0 pg 32.0  MCHC Latest Ref Range: 30.0 - 36.0 g/dL 35.1  RDW Latest Ref Range: 11.5 - 15.5 % 20.0 (H)  Platelets Latest Ref Range: 150 - 400 K/uL 61 (L)  nRBC Latest Ref Range: 0.0 - 0.2 % 1.2 (  H)     ALLERGIES:  Allergies  Allergen Reactions  . Lisinopril Cough      PERTINENT MEDICATIONS:  Outpatient Encounter Medications as of 06/02/2020  Medication Sig  . acetaminophen (TYLENOL) 500 MG tablet Take 500 mg by mouth 2 (two) times daily.  Marland Kitchen allopurinol (ZYLOPRIM) 100 MG tablet TAKE 2 TABLETS BY MOUTH DAILY (Patient taking differently: Take 200 mg by mouth daily.)  . aspirin EC 81 MG EC tablet Take 1 tablet (81 mg total) by mouth daily. Swallow whole.  Marland Kitchen atorvastatin (LIPITOR) 40 MG tablet TAKE 1 TABLET BY MOUTH DAILY (Patient taking differently: Take 40 mg by mouth daily.)  . Cyanocobalamin (VITAMIN B-12) 2000 MCG TBCR Take 1 tablet (2,000 mcg total) by mouth daily.  Marland Kitchen donepezil (ARICEPT) 5 MG tablet Take 1 tablet (5 mg total) by mouth at bedtime.  Marland Kitchen latanoprost (XALATAN) 0.005 % ophthalmic solution Place 1 drop into  both eyes at bedtime.  . torsemide 40 MG TABS Take 40 mg by mouth 2 (two) times daily.   No facility-administered encounter medications on file as of 06/02/2020.   ROS  General: NAD EYES: No vision changes,  ENMT: No dysphagia no xerostomia Cardiovascular: No chest pain Pulmonary: No cough, SOB  Abdomen:no constipation or diarrhea GU: No dysuria or urinary frequency MSK:   ROM limitations Skin: No rashes or wounds Neurological: weakness Psych:  positive mood Heme/lymph/immuno: No bruises or abnormal bleeding  PHYSICAL EXAM  Height:  5 feet 5 inches     Weight: 175Ibs Constitutional: In no acute distress, well developed and well nourished Cardiovascular: regular rate and rhythm; no edema in BLE Pulmonary: no cough, no increased work of breathing, normal respiratory effort Abdomen: soft, non tender, positive bowel sounds in all quadrants GU:  no suprapubic tenderness Eyes: Normal lids, no discharge, sclera anicteric ENMT: Moist mucous membranes Musculoskeletal: weakness, non ambulatory Skin: no rash to visible skin, dry skin,warm without cyanosis; provolone boots on bilateral feet Psych: non-anxious affect Neurological: Weakness but otherwise non focal; sleepy most of the visit Heme/lymph/immuno: no bruises, no bleeding  Thank you for the opportunity to participate in the care of Erin Liu Please call our office at [phone] if we can be of additional assistance.  Note: Portions of this note were generated with [employer]. Dictation errors may occur despite best attempts at proofreading.  Teodoro Spray, NP

## 2020-06-03 ENCOUNTER — Ambulatory Visit: Payer: Medicare HMO | Admitting: Podiatry

## 2020-06-03 ENCOUNTER — Ambulatory Visit (INDEPENDENT_AMBULATORY_CARE_PROVIDER_SITE_OTHER): Payer: Medicare HMO | Admitting: *Deleted

## 2020-06-03 DIAGNOSIS — I5040 Unspecified combined systolic (congestive) and diastolic (congestive) heart failure: Secondary | ICD-10-CM

## 2020-06-03 DIAGNOSIS — D631 Anemia in chronic kidney disease: Secondary | ICD-10-CM | POA: Diagnosis not present

## 2020-06-03 DIAGNOSIS — N179 Acute kidney failure, unspecified: Secondary | ICD-10-CM | POA: Diagnosis not present

## 2020-06-03 DIAGNOSIS — E1122 Type 2 diabetes mellitus with diabetic chronic kidney disease: Secondary | ICD-10-CM | POA: Diagnosis not present

## 2020-06-03 DIAGNOSIS — R6520 Severe sepsis without septic shock: Secondary | ICD-10-CM | POA: Diagnosis not present

## 2020-06-03 DIAGNOSIS — R413 Other amnesia: Secondary | ICD-10-CM

## 2020-06-03 DIAGNOSIS — N184 Chronic kidney disease, stage 4 (severe): Secondary | ICD-10-CM | POA: Diagnosis not present

## 2020-06-03 DIAGNOSIS — I130 Hypertensive heart and chronic kidney disease with heart failure and stage 1 through stage 4 chronic kidney disease, or unspecified chronic kidney disease: Secondary | ICD-10-CM | POA: Diagnosis not present

## 2020-06-03 DIAGNOSIS — J9601 Acute respiratory failure with hypoxia: Secondary | ICD-10-CM | POA: Diagnosis not present

## 2020-06-03 DIAGNOSIS — I5041 Acute combined systolic (congestive) and diastolic (congestive) heart failure: Secondary | ICD-10-CM | POA: Diagnosis not present

## 2020-06-03 DIAGNOSIS — A419 Sepsis, unspecified organism: Secondary | ICD-10-CM | POA: Diagnosis not present

## 2020-06-03 NOTE — Patient Instructions (Addendum)
Visit Information  Erin Liu, it was nice talking with you today about your mother's care.   Please read over the attached information, and feel free to contact me for any questions you may have   I look forward to talking to you again for an update on Aug 01, 2020 at 11:30 am- please be listening out for my call that day.  I will call as close to 11:30 am as possible; I look forward to hearing about Erin Liu' progress.   Please don't hesitate to contact me if I can be of assistance to you before our next scheduled appointment.   Oneta Rack, RN, BSN, [facility] RN Care Coordination- [place] [phone]: direct office [phone]: mobile    PATIENT GOALS:  Goals Addressed            This Visit's Progress   . Prevent Falls-Memory loss; weakness   On track    Timeframe:  Long-Range Goal Priority:  Medium Start Date:      06/03/20                       Expected End Date:    09/03/20                   Follow Up Date 08/01/20   . Always wear shoes or slippers with non-slip sole: Change positions slowly . Use a walker or wheelchair: caregivers stay with patient for all walking tasks . Use nightlight in the bathroom, halls, continue assisting patient with visual prompting . Always wear low-heeled or flat shoes or slippers with nonskid soles  . Make a plan for tasks that require patient movement- goal is to keep patient and caregivers safe and fall-free . Continue working with home health services for PT/ OT   Why is this important?    There may be trouble with balance, weakness, vision, and getting around. Planning ahead and visualizing tasks before starting them can prevent falls/ injury       . Track and Manage Symptoms-Heart Failure   On track    Timeframe:  Long-Range Goal Priority:  Medium Start Date:     06/03/20                        Expected End Date:    09/03/20                   Follow Up Date 08/01/20   . Develop and follow  rescue plan . Follow rescue plan if symptoms flare-up . Know when to call the doctor . Track symptoms and what helps feel better or worse . Dress right for the weather, hot or cold  . Follow activity or exercise plan- continue working with home health PT and OT . Pace activity allowing for rest . Continue using salt in moderation . Watch for swelling in feet, ankles and legs every day; if you develop a new cough of shortness of breath, call your doctor right away Why is this important?    You will be able to handle your symptoms better if you keep track of them.         Dehydration, Elderly  Dehydration is condition in which there is not enough water or other fluids in the body. This happens when a person loses more fluids than he or she takes in. Important body parts cannot work right without the right amount  of fluids. Any loss of fluids from the body can cause dehydration. People 20 years of age or older have a higher risk of dehydration than younger adults. This is because in older age, the body:  Is less able to keep the right amount of water.  Does not respond to temperature changes as well.  Does not get a sense of thirst as easily or quickly. Dehydration can be mild, worse, or very bad. It should be treated right away to keep it from getting very bad. What are the causes? This condition may be caused by:  Conditions that cause loss of water or other fluids, such as: ? Watery poop (diarrhea). ? Vomiting. ? Sweating a lot. ? Peeing (urinating) a lot.  Not drinking enough fluids, especially when you: ? Are ill. ? Are doing things that take a lot of energy to do.  Other illnesses and conditions, such as fever or infection.  Certain medicines, such as medicines that take extra fluid out of the body (diuretics).  Lack of safe drinking water.  Not being able to get enough water and food. What increases the risk? The following factors may make you more likely to  develop this condition:  Having a long-term (chronic) illness that has not been treated the right way, such as: ? An illness that may cause you to pee more, such as diabetes. ? Kidney, heart, or lung disease. ? A condition such as dementia. This affects:  The brain and nervous system.  Thinking.  Feelings.  Being 67 years of age or older.  Having a disability.  Living in a place that is high above the ground or sea (high in altitude). The thinner, drier air causes more fluid loss. What are the signs or symptoms? Symptoms of dehydration depend on how bad it is. Mild or worse dehydration  Thirst.  Dry lips or dry mouth.  Feeling dizzy or light-headed, especially when you stand up from sitting.  Muscle cramps.  Your body making: ? Dark pee (urine). Pee may be the color of tea. ? Less pee than normal. ? Less tears than normal.  Headache. Very bad dehydration  Changes in skin. Skin may: ? Be cold to the touch (clammy). ? Be blotchy or pale. ? Not go back to normal right after you lightly pinch it and let it go.  Little or no tears, pee, or sweat.  Changes in vital signs, such as: ? Fast breathing. ? Low blood pressure. ? Weak pulse. ? Pulse that is more than 100 beats a minute when you are sitting still.  Other changes, such as: ? Feeling very thirsty. ? Eyes that look hollow (sunken). ? Cold hands and feet. ? Being mixed up (confused). ? Being very tired (lethargic) or having trouble waking from sleep. ? Short-term weight loss. ? Loss of consciousness. How is this treated? Treatment for this condition depends on how bad it is. Treatment should start right away. Do not wait until your condition gets very bad. Very bad dehydration is an emergency. You will need to go to a hospital.  Mild or worse dehydration can be treated at home. You may be asked to: ? Drink more fluids. ? Drink an oral rehydration solution (ORS). This drink helps get the right amounts of  fluids and salts and minerals in the blood (electrolytes).  Very bad dehydration can be treated: ? With fluids through an IV tube. ? By getting normal levels of salts and minerals in your blood. This is  often done by giving salts and minerals through a tube. The tube is passed through your nose and into your stomach. ? By treating the root cause. Follow these instructions at home: Oral rehydration solution If told by your doctor, drink an ORS:  Make an ORS. Use instructions on the package.  Start by drinking small amounts, about  cup (120 mL) every 5-10 minutes.  Slowly drink more until you have had the amount that your doctor said to have. Eating and drinking  Drink enough clear fluid to keep your pee pale yellow. If you were told to drink an ORS, finish the ORS first. Then, start slowly drinking other clear fluids. Drink fluids such as: ? Water. Do not drink only water. Doing that can make the salt (sodium) level in your body get too low. ? Water from ice chips you suck on. ? Fruit juice that you have added water to (diluted). ? Low-calorie sports drinks.  Eat foods that have the right amounts of salts and minerals, such as: ? Bananas. ? Oranges. ? Potatoes. ? Tomatoes. ? Spinach.  Do not drink alcohol.  Avoid: ? Drinks that have a lot of sugar. These include:  High-calorie sports drinks.  Fruit juice that you did not add water to.  Soda.  Caffeine. ? Foods that are greasy or have a lot of fat or sugar.         General instructions  Take over-the-counter and prescription medicines only as told by your doctor.  Do not take salt tablets. Doing that can make the salt level in your body get too high.  Return to your normal activities as told by your doctor. Ask your doctor what activities are safe for you.  Keep all follow-up visits as told by your doctor. This is important. Contact a doctor if:  You have pain in your belly (abdomen) and the pain: ? Gets  worse. ? Stays in one place.  You have a rash.  You have a stiff neck.  You get angry or annoyed (irritable) more easily than normal.  You are more tired or have a harder time waking than normal.  You feel: ? Weak or dizzy. ? Very thirsty. Get help right away if you have:  Any symptoms of very bad dehydration.  A fever.  A very bad headache.  Symptoms of vomiting, such as: ? Your vomiting gets worse or does not go away. ? Your vomit has blood or green stuff in it. ? You cannot eat or drink without vomiting.  Problems with peeing or pooping (having a bowel movement), such as: ? Watery poop that gets worse or does not go away. ? Blood in your poop (stool). This may cause poop to look black and tarry. ? Not peeing in 6-8 hours. ? Peeing only a small amount of very dark pee in 6-8 hours.  Trouble breathing.  Symptoms that get worse with treatment. These symptoms may be an emergency. Do not wait to see if the symptoms will go away. Get medical help right away. Call your local emergency services (911 in the [place] Do not drive yourself to the hospital. Summary  Dehydration is a condition in which there is not enough water or other fluids in the body. This happens when a person loses more fluids than he or she takes in.  Treatment for this condition depends on how bad it is. Treatment should be started right away. Do not wait until your condition gets very bad.  Drink enough  clear fluid to keep your pee pale yellow. If you were told to drink an oral rehydration solution (ORS), finish the ORS first. Then, start slowly drinking other clear fluids.  Take over-the-counter and prescription medicines only as told by your doctor.  Get help right away if you have any symptoms of very bad dehydration. This information is not intended to replace advice given to you by your health care provider. Make sure you discuss any questions you have with your health care provider. Document  Revised: 10/05/2018 Document Reviewed: 10/05/2018 Elsevier Patient Education  2021 [place] for Falls Each year, millions of people have serious injuries from falls. It is important to understand your risk for falling. Talk with your health care provider about your risk and what you can do to lower it. There are actions you can take at home to lower your risk and prevent falls. If you do have a serious fall, make sure to tell your health care provider. Falling once raises your risk of falling again. How can falls affect me? Serious injuries from falls are common. These include:  Broken bones, such as hip fractures.  Head injuries, such as traumatic brain injuries (TBI) or concussion. A fear of falling can cause you to avoid activities and stay at home. This can make your muscles weaker and actually raise your risk for a fall. What can increase my risk? There are a number of risk factors that increase your risk for falling. The more risk factors you have, the higher your risk of falling. Serious injuries from a fall happen most often to people older than age 70. Children and young adults ages 101-29 are also at higher risk. Common risk factors include:  Weakness in the lower body.  Lack (deficiency) of vitamin D.  Being generally weak or confused due to long-term (chronic) illness.  Dizziness or balance problems.  Poor vision.  Medicines that cause dizziness or drowsiness. These can include medicines for your blood pressure, heart, anxiety, insomnia, or edema, as well as pain medicines and muscle relaxants. Other risk factors include:  Drinking alcohol.  Having had a fall in the past.  Having depression.  Having foot pain or wearing improper footwear.  Working at a dangerous job.  Having any of the following in your home: ? Tripping hazards, such as floor clutter or loose rugs. ? Poor lighting. ? Pets.  Having dementia or memory loss. What  actions can I take to lower my risk of falling? Physical activity Maintain physical fitness. Do strength and balance exercises. Consider taking a regular class to build strength and balance. Yoga and tai chi are good options. Vision Have your eyes checked every year and your vision prescription updated as needed. Walking aids and footwear  Wear nonskid shoes. Do not wear high heels.  Do not walk around the house in socks or slippers.  Use a cane or walker as told by your health care provider. Home safety  Attach secure railings on both sides of your stairs.  Install grab bars for your tub, shower, and toilet. Use a bath mat in your tub or shower.  Use good lighting in all rooms. Keep a flashlight near your bed.  Make sure there is a clear path from your bed to the bathroom. Use night-lights.  Do not use throw rugs. Make sure all carpeting is taped or tacked down securely.  Remove all clutter from walkways and stairways, including extension cords.  Repair uneven or  broken steps.  Avoid walking on icy or slippery surfaces. Walk on the grass instead of on icy or slick sidewalks. Use ice melt to get rid of ice on walkways.  Use a cordless phone.      Questions to ask your health care provider  Can you help me check my risk for a fall?  Do any of my medicines make me more likely to fall?  Should I take a vitamin D supplement?  What exercises can I do to improve my strength and balance?  Should I make an appointment to have my vision checked?  Do I need a bone density test to check for weak bones or osteoporosis?  Would it help to use a cane or a walker? Where to find more information  Centers for Disease Control and Prevention, STEADI: http://www.wolf.info/  Community-Based Fall Prevention Programs: http://www.wolf.info/  National Institute on Aging: http://kim-miller.com/ Contact a health care provider if:  You fall at home.  You are afraid of falling at home.  You feel weak, drowsy, or  dizzy. Summary  Serious injuries from a fall happen most often to people older than age 1. Children and young adults ages 101-29 are also at higher risk.  Talk with your health care provider about your risks for falling and how to lower those risks.  Taking certain precautions at home can lower your risk for falling.  If you fall, always tell your health care provider. This information is not intended to replace advice given to you by your health care provider. Make sure you discuss any questions you have with your health care provider. Document Revised: 09/26/2019 Document Reviewed: 09/26/2019 Elsevier Patient Education  [place].    Consent to CCM Services: Erin Liu was given information about Chronic Care Management services today including:  1. CCM service includes personalized support from designated clinical staff supervised by her physician, including individualized plan of care and coordination with other care providers 2. 24/7 contact phone numbers for assistance for urgent and routine care needs. 3. Service will only be billed when office clinical staff spend 20 minutes or more in a month to coordinate care. 4. Only one practitioner may furnish and bill the service in a calendar month. 5. The patient may stop CCM services at any time (effective at the end of the month) by phone call to the office staff. 6. The patient will be responsible for cost sharing (co-pay) of up to 20% of the service fee (after annual deductible is met).  Patient agreed to services and verbal consent obtained.   Patient verbalizes understanding of instructions provided today and agrees to view in [place].   Telephone follow up appointment with care management team member scheduled for: The patient has been provided with contact information for the care management team and has been advised to call with any health related questions or concerns.   Oneta Rack, RN, BSN, [facility] RN Care Coordination- [place] [phone]: direct office [phone]: mobile    CLINICAL CARE PLAN: Patient Care Plan: Dementia (Adult)    Problem Identified: Harm or Injury   Priority: Medium    Long-Range Goal: Harm or Injury Prevented   Start Date: 06/03/2020  Expected End Date: 09/03/2020  This Visit's Progress: On track  Priority: Medium  Note:   Current Barriers:  Erin Liu Kitchen Knowledge Deficits related to fall precautions in patient with memory loss  . Need for ongoing reinforcement of fall prevention strategies . Unable  to independently perform ADLs and iADL's: requires assistance for all from family caregivers . Unable to self administer medications as prescribed- family administers medications, uses pill box . Care Coordination potential needs related to upcoming transfer of care to PACE program in a patient with memory loss/ dementia in patient that requires total care from family caregivers . Cognitive Deficits: decreased vision, memory loss Clinical Goal(s):  Over the next 3 months, patient's caregiver will: . verbalize improved adherence to prescribed treatment plan for decreasing falls as evidenced by caregiver reporting and review of EHR . verbalize using fall risk reduction strategies discussed Interventions:  . Collaboration with Binnie Rail, MD regarding development and update of comprehensive plan of care as evidenced by provider attestation and co-signature . Inter-disciplinary care team collaboration (see longitudinal plan of care) . Provided written and verbal education re: Potential causes of falls and Fall prevention strategies . Reviewed medications and discussed potential side effects of medications such as dizziness and frequent urination . Assessed for s/s of orthostatic hypotension; discussed need to balance fluid intake to prevent fluid overload and dehydration . Assessed caregiver knowledge of fall risk prevention   . Provided patient's caregiver with verbal and printed educational materials related to fall risks/ prevention . Discussed caregiver's current plan to enroll patient in PACE program and provided positive reinforcement for proactive planning measures currently in process Self-Care Deficits:  . Unable to independently perform ADLs and iADL's: requires assistance for all from family caregivers . Unable to self administer medications as prescribed- family administers medications, uses pill box . Cognitive Deficits: decreased vision, memory loss Patient Goals:  . Always wear shoes or slippers with non-slip sole: Change positions slowly . Use a walker or wheelchair: caregivers stay with patient for all walking tasks . Use nightlight in the bathroom, halls, continue assisting patient with visual prompting . Always wear low-heeled or flat shoes or slippers with nonskid soles  . Make a plan for tasks that require patient movement- goal is to keep patient and caregivers safe and fall-free . Continue working with home health services for PT/ OT Follow Up Plan:  . Telephone follow up appointment with care management team member scheduled for: Aug 01, 2020 . The patient has been provided with contact information for the care management team and has been advised to call with any health related questions or concerns.     Patient Care Plan: Heart Failure (Adult)    Problem Identified: Symptom Exacerbation (Heart Failure)   Priority: Medium    Long-Range Goal: Symptom Exacerbation Prevented or Minimized   Start Date: 06/03/2020  Expected End Date: 09/03/2020  This Visit's Progress: On track  Priority: Medium  Note:   Current Barriers:  Erin Liu Kitchen Knowledge deficit related to basic heart failure pathophysiology and self care management . Cognitive Deficits- memory loss . Unable to independently perform ADLs/ iADL's independently: strong supportive family assists with all care needs . Unable to self administer  medications as prescribed- family provides medications and supervises medication administration . Weakness post- recent hospitalization Case Manager Clinical Goal(s):  Over the next 90 days, patient's caregiver will: Erin Liu Kitchen Verbalize accurate signs/ symptoms of heart failure exacerbation in setting of being unable to weigh patient daily due to fall risk . Verbalize understanding of Heart Failure Action Plan and when to call doctor Interventions:  . Collaboration with Binnie Rail, MD regarding development and update of comprehensive plan of care as evidenced by provider attestation and co-signature . Inter-disciplinary care team collaboration (see longitudinal plan  of care) . Basic overview and discussion of pathophysiology of Heart Failure reviewed with caregiver: caregiver has good general understanding of same and could benefit from ongoing reinforcement of same . Provided verbal education on low sodium diet, need to balance fluid intake with prevention of dehydration in setting of CHF and CKD . Reviewed role of diuretics in prevention of fluid overload and management of heart failure . Confirmed with caregiver that patient is active with [facility]iative [facility] team, reviewed yesterday's palliative care home visit with caregiver: caregiver /daughter works with [employer] [facility] team as Ecologist . Medication review completed: no concerns identified . Reviewed recent hospitalization and PCP virtual office visit with caregiver: confirmed no questions, caregiver has good general understanding of post-hospital discharge plan of care . Assessed caregiver's ability to assist patient in obtaining daily weights- discussed need to ensure patient safety/ fall prevention: cautioned caregiver to not attempt daily weights if she is concerned for patient/ personal safety . Reviewed basic weight gain guidelines in setting of CHF along with corresponding action plan,  should caregiver be able to begin monitoring and recording weights at home . Confirmed patient follows low-salt diet Self-Care Activities: . Patient verbalizes understanding of plan to continue actively participating with home health services, community palliative care team, with PACE program participation currently pending . Self administers medications as prescribed . Attends all scheduled provider appointments . Calls pharmacy for medication refills . Calls provider office for new concerns or questions Patient Goals: . Develop and follow rescue plan . Follow rescue plan if symptoms flare-up . Know when to call the doctor . Track symptoms and what helps feel better or worse . Dress right for the weather, hot or cold  . Follow activity or exercise plan- continue working with home health PT and OT . Pace activity allowing for rest . Continue using salt in moderation . Watch for swelling in feet, ankles and legs every day; if you develop a new cough of shortness of breath, call your doctor right away Follow Up Plan:  . Telephone follow up appointment with care management team member scheduled for: Aug 01, 2020 . The patient has been provided with contact information for the care management team and has been advised to call with any health related questions or concerns.

## 2020-06-03 NOTE — Chronic Care Management (AMB) (Signed)
Chronic Care Management   CCM RN Visit Note  06/03/2020 Name: Erin Liu MRN: [id number] DOB: Oct 26, 1940  Subjective: Erin Liu is a 80 y.o. year old female who is a primary care patient of Burns, Claudina Lick, MD. The care management team was consulted for assistance with disease management and care coordination needs.    Engaged with patient's caregiver by telephone for initial visit in response to provider referral for case management and/or care coordination services.   Consent to Services:  The patient was given information about Chronic Care Management services, agreed to services, and gave verbal consent prior to initiation of services.  Please see initial CCM consent visit note for detailed documentation.   Patient agreed to services and verbal consent obtained.   Assessment: Review of patient past medical history, allergies, medications, health status, including review of consultants reports, laboratory and other test data, was performed as part of comprehensive evaluation and provision of chronic care management services.   SDOH (Social Determinants of Health) assessments and interventions performed:  SDOH Interventions   Flowsheet Row Most Recent Value  SDOH Interventions   Food Insecurity Interventions Intervention Not Indicated  Transportation Interventions Intervention Not Indicated      CCM Care Plan  Allergies  Allergen Reactions  . Lisinopril Cough    Outpatient Encounter Medications as of 06/03/2020  Medication Sig Note  . acetaminophen (TYLENOL) 500 MG tablet Take 500 mg by mouth 2 (two) times daily.   Marland Kitchen allopurinol (ZYLOPRIM) 100 MG tablet TAKE 2 TABLETS BY MOUTH DAILY (Patient taking differently: Take 200 mg by mouth daily.) 06/03/2020: 06/03/20: Caregiver reports takes BID am and pm   . aspirin EC 81 MG EC tablet Take 1 tablet (81 mg total) by mouth daily. Swallow whole.   Marland Kitchen atorvastatin (LIPITOR) 40 MG tablet TAKE 1 TABLET BY MOUTH DAILY (Patient  taking differently: Take 40 mg by mouth daily.)   . Cyanocobalamin (VITAMIN B-12) 2000 MCG TBCR Take 1 tablet (2,000 mcg total) by mouth daily.   Marland Kitchen donepezil (ARICEPT) 5 MG tablet Take 1 tablet (5 mg total) by mouth at bedtime.   . torsemide 40 MG TABS Take 40 mg by mouth 2 (two) times daily.   Marland Kitchen latanoprost (XALATAN) 0.005 % ophthalmic solution Place 1 drop into both eyes at bedtime. (Patient not taking: Reported on 06/03/2020) 06/03/2020: 06/03/20: caregiver reports in process of getting refill    No facility-administered encounter medications on file as of 06/03/2020.    Patient Active Problem List   Diagnosis Date Noted  . Cardiogenic shock ([place])   . Non-ST elevation (NSTEMI) myocardial infarction ([place])   . Renal failure (ARF), acute on chronic (HCC) 05/15/2020  . Hypotension 05/15/2020  . CAP (community acquired pneumonia) 05/15/2020  . Acute metabolic encephalopathy 92/01/9416  . Sepsis ([place]) 05/15/2020  . Fall at home, initial encounter 05/15/2020  . Prolonged QT interval 05/15/2020  . Demand ischemia ([place])   . Recurrent falls 04/28/2020  . Weight loss, unintentional 09/11/2019  . Memory disorder 07/18/2019  . Hand tingling 07/24/2018  . Physical deconditioning 01/13/2018  . Tremor of both hands 10/10/2017  . Urinary incontinence 10/10/2017  . Vasovagal syncope 10/02/2017  . Gout 07/12/2017  . B12 deficiency 04/11/2017  . Cough 08/26/2015  . Lumbago 02/25/2015  . Essential tremor   . Glaucoma 01/08/2010  . Chronic kidney disease (CKD), stage IV (severe) ([place]) 12/10/2008  . DM (diabetes mellitus), secondary, uncontrolled, with renal complications ([place]) 40/81/4481  . Diabetic neuropathy ([place]) 02/22/2006  .  Dyslipidemia 02/22/2006  . 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 CARPAL TUNNEL SYNDROME, BILATERAL 02/22/2006    Conditions to be addressed/monitored:CHF, CKD Stage IV, and Dementia  Care Plan : Dementia (Adult)  Updates made by Knox Royalty, RN since 06/03/2020 12:00 AM     Problem: Harm or Injury   Priority: Medium    Long-Range Goal: Harm or Injury Prevented   Start Date: 06/03/2020  Expected End Date: 09/03/2020  This Visit's Progress: On track  Priority: Medium  Note:   Current Barriers:  Marland Kitchen Knowledge Deficits related to fall precautions in patient with memory loss  . Need for ongoing reinforcement of fall prevention strategies . Unable to independently perform ADLs and iADL's: requires assistance for all from family caregivers . Unable to self administer medications as prescribed- family administers medications, uses pill box . Care Coordination potential needs related to upcoming transfer of care to PACE program in a patient with memory loss/ dementia in patient that requires total care from family caregivers . Cognitive Deficits: decreased vision, memory loss Clinical Goal(s):  Over the next 3 months, patient's caregiver will: . verbalize improved adherence to prescribed treatment plan for decreasing falls as evidenced by caregiver reporting and review of EHR . verbalize using fall risk reduction strategies discussed Interventions:  . Collaboration with Binnie Rail, MD regarding development and update of comprehensive plan of care as evidenced by provider attestation and co-signature . Inter-disciplinary care team collaboration (see longitudinal plan of care) . Provided written and verbal education re: Potential causes of falls and Fall prevention strategies . Reviewed medications and discussed potential side effects of medications such as dizziness and frequent urination . Assessed for s/s of orthostatic hypotension; discussed need to balance fluid intake to prevent fluid overload and dehydration . Assessed caregiver knowledge of fall risk prevention  . Provided patient's caregiver with verbal and printed educational materials related to fall risks/ prevention . Discussed caregiver's current plan to enroll patient in PACE program and provided  positive reinforcement for proactive planning measures currently in process Self-Care Deficits:  . Unable to independently perform ADLs and iADL's: requires assistance for all from family caregivers . Unable to self administer medications as prescribed- family administers medications, uses pill box . Cognitive Deficits: decreased vision, memory loss Patient Goals:  . Always wear shoes or slippers with non-slip sole: Change positions slowly . Use a walker or wheelchair: caregivers stay with patient for all walking tasks . Use nightlight in the bathroom, halls, continue assisting patient with visual prompting . Always wear low-heeled or flat shoes or slippers with nonskid soles  . Make a plan for tasks that require patient movement- goal is to keep patient and caregivers safe and fall-free . Continue working with home health services for PT/ OT Follow Up Plan:  . Telephone follow up appointment with care management team member scheduled for: Aug 01, 2020 . The patient has been provided with contact information for the care management team and has been advised to call with any health related questions or concerns.     Care Plan : Heart Failure (Adult)  Updates made by Knox Royalty, RN since 06/03/2020 12:00 AM    Problem: Symptom Exacerbation (Heart Failure)   Priority: Medium    Long-Range Goal: Symptom Exacerbation Prevented or Minimized   Start Date: 06/03/2020  Expected End Date: 09/03/2020  This Visit's Progress: On track  Priority: Medium  Note:   Current Barriers:  Marland Kitchen Knowledge deficit related to basic heart failure pathophysiology and  self care management . Cognitive Deficits- memory loss . Unable to independently perform ADLs/ iADL's independently: strong supportive family assists with all care needs . Unable to self administer medications as prescribed- family provides medications and supervises medication administration . Weakness post- recent hospitalization Case Manager  Clinical Goal(s):  Over the next 90 days, patient's caregiver will: Marland Kitchen Verbalize accurate signs/ symptoms of heart failure exacerbation in setting of being unable to weigh patient daily due to fall risk . Verbalize understanding of Heart Failure Action Plan and when to call doctor Interventions:  . Collaboration with Binnie Rail, MD regarding development and update of comprehensive plan of care as evidenced by provider attestation and co-signature . Inter-disciplinary care team collaboration (see longitudinal plan of care) . Basic overview and discussion of pathophysiology of Heart Failure reviewed with caregiver: caregiver has good general understanding of same and could benefit from ongoing reinforcement of same . Provided verbal education on low sodium diet, need to balance fluid intake with prevention of dehydration in setting of CHF and CKD . Reviewed role of diuretics in prevention of fluid overload and management of heart failure . Confirmed with caregiver that patient is active with [facility]iative Care team, reviewed yesterday's palliative care home visit with caregiver: caregiver /daughter works with [facility] team as Ecologist . Medication review completed: no concerns identified . Reviewed recent hospitalization and PCP virtual office visit with caregiver: confirmed no questions, caregiver has good general understanding of post-hospital discharge plan of care . Assessed caregiver's ability to assist patient in obtaining daily weights- discussed need to ensure patient safety/ fall prevention: cautioned caregiver to not attempt daily weights if she is concerned for patient/ personal safety . Reviewed basic weight gain guidelines in setting of CHF along with corresponding action plan, should caregiver be able to begin monitoring and recording weights at home . Confirmed patient follows low-salt diet Self-Care  Activities: . Patient verbalizes understanding of plan to continue actively participating with home health services, community palliative care team, with PACE program participation currently pending . Self administers medications as prescribed . Attends all scheduled provider appointments . Calls pharmacy for medication refills . Calls provider office for new concerns or questions Patient Goals: . Develop and follow rescue plan . Follow rescue plan if symptoms flare-up . Know when to call the doctor . Track symptoms and what helps feel better or worse . Dress right for the weather, hot or cold  . Follow activity or exercise plan- continue working with home health PT and OT . Pace activity allowing for rest . Continue using salt in moderation . Watch for swelling in feet, ankles and legs every day; if you develop a new cough of shortness of breath, call your doctor right away Follow Up Plan:  . Telephone follow up appointment with care management team member scheduled for: Aug 01, 2020 . The patient has been provided with contact information for the care management team and has been advised to call with any health related questions or concerns.       Plan:  Telephone follow up appointment with care management team member scheduled for:  Aug 01, 2020   The patient has been provided with contact information for the care management team and has been advised to call with any health related questions or concerns.   Oneta Rack, RN, BSN, [facility] RN Care Coordination- [place] [phone]: direct office [phone]: mobile

## 2020-06-05 DIAGNOSIS — A419 Sepsis, unspecified organism: Secondary | ICD-10-CM | POA: Diagnosis not present

## 2020-06-05 DIAGNOSIS — N184 Chronic kidney disease, stage 4 (severe): Secondary | ICD-10-CM | POA: Diagnosis not present

## 2020-06-05 DIAGNOSIS — J9601 Acute respiratory failure with hypoxia: Secondary | ICD-10-CM | POA: Diagnosis not present

## 2020-06-05 DIAGNOSIS — N179 Acute kidney failure, unspecified: Secondary | ICD-10-CM | POA: Diagnosis not present

## 2020-06-05 DIAGNOSIS — I5041 Acute combined systolic (congestive) and diastolic (congestive) heart failure: Secondary | ICD-10-CM | POA: Diagnosis not present

## 2020-06-05 DIAGNOSIS — I130 Hypertensive heart and chronic kidney disease with heart failure and stage 1 through stage 4 chronic kidney disease, or unspecified chronic kidney disease: Secondary | ICD-10-CM | POA: Diagnosis not present

## 2020-06-05 DIAGNOSIS — R6520 Severe sepsis without septic shock: Secondary | ICD-10-CM | POA: Diagnosis not present

## 2020-06-05 DIAGNOSIS — D631 Anemia in chronic kidney disease: Secondary | ICD-10-CM | POA: Diagnosis not present

## 2020-06-05 DIAGNOSIS — E1122 Type 2 diabetes mellitus with diabetic chronic kidney disease: Secondary | ICD-10-CM | POA: Diagnosis not present

## 2020-06-07 ENCOUNTER — Emergency Department (HOSPITAL_COMMUNITY)
Admission: EM | Admit: 2020-06-07 | Discharge: 2020-06-08 | Disposition: A | Discharge status: Home / Self Care | Payer: Medicare HMO | Attending: Emergency Medicine | Admitting: Emergency Medicine

## 2020-06-07 ENCOUNTER — Emergency Department (HOSPITAL_COMMUNITY): Payer: Medicare HMO

## 2020-06-07 DIAGNOSIS — Z79899 Other long term (current) drug therapy: Secondary | ICD-10-CM | POA: Insufficient documentation

## 2020-06-07 DIAGNOSIS — N3000 Acute cystitis without hematuria: Secondary | ICD-10-CM | POA: Insufficient documentation

## 2020-06-07 DIAGNOSIS — R4182 Altered mental status, unspecified: Secondary | ICD-10-CM | POA: Insufficient documentation

## 2020-06-07 DIAGNOSIS — I129 Hypertensive chronic kidney disease with stage 1 through stage 4 chronic kidney disease, or unspecified chronic kidney disease: Secondary | ICD-10-CM | POA: Diagnosis not present

## 2020-06-07 DIAGNOSIS — R457 State of emotional shock and stress, unspecified: Secondary | ICD-10-CM | POA: Diagnosis not present

## 2020-06-07 DIAGNOSIS — Z7982 Long term (current) use of aspirin: Secondary | ICD-10-CM | POA: Insufficient documentation

## 2020-06-07 DIAGNOSIS — J90 Pleural effusion, not elsewhere classified: Secondary | ICD-10-CM | POA: Diagnosis not present

## 2020-06-07 DIAGNOSIS — R29810 Facial weakness: Secondary | ICD-10-CM | POA: Diagnosis not present

## 2020-06-07 DIAGNOSIS — N184 Chronic kidney disease, stage 4 (severe): Secondary | ICD-10-CM | POA: Diagnosis not present

## 2020-06-07 DIAGNOSIS — I517 Cardiomegaly: Secondary | ICD-10-CM | POA: Diagnosis not present

## 2020-06-07 DIAGNOSIS — I10 Essential (primary) hypertension: Secondary | ICD-10-CM | POA: Diagnosis not present

## 2020-06-07 DIAGNOSIS — R531 Weakness: Secondary | ICD-10-CM | POA: Insufficient documentation

## 2020-06-07 DIAGNOSIS — R0902 Hypoxemia: Secondary | ICD-10-CM | POA: Diagnosis not present

## 2020-06-07 DIAGNOSIS — E1122 Type 2 diabetes mellitus with diabetic chronic kidney disease: Secondary | ICD-10-CM | POA: Insufficient documentation

## 2020-06-07 NOTE — ED Provider Notes (Signed)
[facility] EMERGENCY DEPARTMENT Provider Note   CSN: [id number] Arrival date & time: 06/07/20  2335     History Chief Complaint  Patient presents with  . Altered Mental Status    Erin Liu is a 80 y.o. female.  The history is provided by the patient and medical records.  Altered Mental Status   80 y.o. F with hx of anemia, arthritis, HTN, HLP, obesity, CKD, presenting to the ED for reported AMS.  EMS was called out because she seemed "more off" than normal but could not specify what.  Patient was recently discharged from rehab facility here at hospital after frequent falls, did have PT/OT.  She has not had any fevers, chills, sweats, cough, abdominal pain, nausea, vomiting, diarrhea.  She states she has been eating/drinking well.  Patient has no complaints at present.  She is able to answer questions and follow commands without difficulty.  Patient does have known diagnosis of dementia.  Past Medical History:  Diagnosis Date  . ANEMIA, NORMOCYTIC   . Arthritis    back  . BREAST CYST, HX OF   . CARPAL TUNNEL SYNDROME, BILATERAL   . DIABETES MELLITUS, TYPE II   . Glaucoma   . HYPERLIPIDEMIA   . HYPERTENSION   . OBESITY   . PERIPHERAL NEUROPATHY   . RENAL FAILURE, CHRONIC    Stage 4    Patient Active Problem List   Diagnosis Date Noted  . Cardiogenic shock ([place])   . Non-ST elevation (NSTEMI) myocardial infarction ([place])   . Renal failure (ARF), acute on chronic (HCC) 05/15/2020  . Hypotension 05/15/2020  . CAP (community acquired pneumonia) 05/15/2020  . Acute metabolic encephalopathy 62/37/6283  . Sepsis ([place]) 05/15/2020  . Fall at home, initial encounter 05/15/2020  . Prolonged QT interval 05/15/2020  . Demand ischemia ([place])   . Recurrent falls 04/28/2020  . Weight loss, unintentional 09/11/2019  . Memory disorder 07/18/2019  . Hand tingling 07/24/2018  . Physical deconditioning 01/13/2018  . Tremor of both hands 10/10/2017  . Urinary  incontinence 10/10/2017  . Vasovagal syncope 10/02/2017  . Gout 07/12/2017  . B12 deficiency 04/11/2017  . Cough 08/26/2015  . Lumbago 02/25/2015  . Essential tremor   . Glaucoma 01/08/2010  . Chronic kidney disease (CKD), stage IV (severe) ([place]) 12/10/2008  . DM (diabetes mellitus), secondary, uncontrolled, with renal complications ([place]) 15/17/6160  . Diabetic neuropathy ([place]) 02/22/2006  . Dyslipidemia 02/22/2006  . 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 CARPAL TUNNEL SYNDROME, BILATERAL 02/22/2006    Past Surgical History:  Procedure Laterality Date  . AV FISTULA PLACEMENT Left 05/23/2018   Procedure: ARTERIOVENOUS (AV) FISTULA CREATION LEFT ARM;  Surgeon: Waynetta Sandy, MD;  Location: [facility];  Service: Vascular;  Laterality: Left;  . BASCILIC VEIN TRANSPOSITION Left 08/03/2018   Procedure: SECOND STAGE BASILIC VEIN TRANSPOSITION LEFT ARM;  Surgeon: Waynetta Sandy, MD;  Location: [facility];  Service: Vascular;  Laterality: Left;  . CATARACT EXTRACTION BILATERAL W/ ANTERIOR VITRECTOMY  05/2010  . COLONOSCOPY     [facility]/7-8 years     OB History   No obstetric history on file.     Family History  Problem Relation Age of Onset  . Arthritis Son   . Diabetes Son   . Hypertension Son   . Colon cancer Son 61  . Neuropathy Son        chemotherapy related  . Heart attack Mother        Died age 86s  .  Heart attack Father        No details 62s  . Heart disease Sister        Slight heart attack, died age 63    Social History   Tobacco Use  . Smoking status: Never Smoker  . Smokeless tobacco: Never Used  Vaping Use  . Vaping Use: Never used  Substance Use Topics  . Alcohol use: No  . Drug use: No    Home Medications Prior to Admission medications   Medication Sig Start Date End Date Taking? Authorizing Provider  acetaminophen (TYLENOL) 500 MG tablet Take 500 mg by mouth 2 (two) times daily.    [provider]  allopurinol (ZYLOPRIM) 100 MG tablet  TAKE 2 TABLETS BY MOUTH DAILY Patient taking differently: Take 200 mg by mouth daily. 05/13/20   Binnie Rail, MD  aspirin EC 81 MG EC tablet Take 1 tablet (81 mg total) by mouth daily. Swallow whole. 05/22/20   Pokhrel, Corrie Mckusick, MD  atorvastatin (LIPITOR) 40 MG tablet TAKE 1 TABLET BY MOUTH DAILY Patient taking differently: Take 40 mg by mouth daily. 11/05/19   Binnie Rail, MD  Cyanocobalamin (VITAMIN B-12) 2000 MCG TBCR Take 1 tablet (2,000 mcg total) by mouth daily. 07/24/18   Binnie Rail, MD  donepezil (ARICEPT) 5 MG tablet Take 1 tablet (5 mg total) by mouth at bedtime. 04/28/20   Burns, Claudina Lick, MD  latanoprost (XALATAN) 0.005 % ophthalmic solution Place 1 drop into both eyes at bedtime. Patient not taking: Reported on 06/03/2020 12/09/17   [provider]  torsemide 40 MG TABS Take 40 mg by mouth 2 (two) times daily. 05/21/20 06/20/20  Pokhrel, Corrie Mckusick, MD    Allergies    Lisinopril  Review of Systems   Review of Systems  Constitutional:       AMS  All other systems reviewed and are negative.   Physical Exam Updated Vital Signs BP 117/77 (BP Location: Left Arm)   Pulse 88   Temp 97.6 F (36.4 C) (Oral)   Resp 20   SpO2 99%   Physical Exam Vitals and nursing note reviewed.  Constitutional:      Appearance: She is well-developed.     Comments: Elderly  HENT:     Head: Normocephalic and atraumatic.  Eyes:     Conjunctiva/sclera: Conjunctivae normal.     Pupils: Pupils are equal, round, and reactive to light.  Cardiovascular:     Rate and Rhythm: Normal rate and regular rhythm.     Heart sounds: Normal heart sounds.  Pulmonary:     Effort: Pulmonary effort is normal. No respiratory distress.     Breath sounds: Normal breath sounds. No rhonchi.  Abdominal:     General: Bowel sounds are normal.     Palpations: Abdomen is soft.     Tenderness: There is no abdominal tenderness. There is no rebound.  Musculoskeletal:        General: Normal range of motion.      Cervical back: Normal range of motion.  Skin:    General: Skin is warm and dry.  Neurological:     Mental Status: She is alert and oriented to person, place, and time.     Comments: Awake, alert, oriented x3, able to give accurate birthday, name, location, surroundings, moving extremities well     ED Results / Procedures / Treatments   Labs (all labs ordered are listed, but only abnormal results are displayed) Labs Reviewed  CBC WITH DIFFERENTIAL/PLATELET -  Abnormal; Notable for the following components:      Result Value   RBC 3.66 (*)    Hemoglobin 11.9 (*)    MCV 104.1 (*)    RDW 21.2 (*)    Platelets 68 (*)    nRBC 1.5 (*)    All other components within normal limits  BASIC METABOLIC PANEL - Abnormal; Notable for the following components:   Sodium 131 (*)    Chloride 96 (*)    Glucose, Bld 104 (*)    BUN 98 (*)    Creatinine, Ser 3.76 (*)    GFR, Estimated 12 (*)    All other components within normal limits  URINALYSIS, ROUTINE W REFLEX MICROSCOPIC - Abnormal; Notable for the following components:   APPearance HAZY (*)    Hgb urine dipstick MODERATE (*)    Leukocytes,Ua LARGE (*)    Bacteria, UA FEW (*)    All other components within normal limits  URINE CULTURE    EKG EKG Interpretation  Date/Time:  Sunday June 08 2020 00:39:57 EDT Ventricular Rate:  89 PR Interval:  181 QRS Duration: 195 QT Interval:  454 QTC Calculation: 553 R Axis:   -42 Text Interpretation: Sinus rhythm Left bundle branch block No significant change was found Confirmed by Ezequiel Essex [phone]) on 06/08/2020 12:55:11 AM   Radiology DG Chest 2 View  Result Date: 06/08/2020 CLINICAL DATA:  Altered status EXAM: CHEST - 2 VIEW COMPARISON:  05/14/2020 FINDINGS: Cardiomegaly, vascular congestion. Small bilateral effusions. No overt edema or confluent opacities. No acute bony abnormality. IMPRESSION: Cardiomegaly, vascular congestion.  Small bilateral effusions. Electronically Signed   By:  Rolm Baptise M.D.   On: 06/08/2020 00:12   CT Head Wo Contrast  Result Date: 06/08/2020 CLINICAL DATA:  Altered mental status of unknown cause. Recent falls. EXAM: CT HEAD WITHOUT CONTRAST TECHNIQUE: Contiguous axial images were obtained from the base of the skull through the vertex without intravenous contrast. COMPARISON:  05/14/2020 FINDINGS: Brain: Diffuse cerebral atrophy. Mild ventricular dilatation consistent with central atrophy. Low-attenuation changes in the deep white matter consistent with small vessel ischemia. No mass-effect or midline shift. No abnormal extra-axial fluid collections. Gray-white matter junctions are distinct. Basal cisterns are not effaced. No acute intracranial hemorrhage. Vascular: Intracranial arterial vascular calcifications. Skull: Normal. Negative for fracture or focal lesion. Sinuses/Orbits: No acute finding. Other: Motion artifact limits examination on some images. IMPRESSION: 1. No acute intracranial abnormalities. 2. Chronic atrophy and small vessel ischemic changes. Electronically Signed   By: Lucienne Capers M.D.   On: 06/08/2020 00:06    Procedures Procedures   Medications Ordered in ED Medications  cephALEXin (KEFLEX) capsule 500 mg (500 mg Oral Given 06/08/20 3785)    ED Course  I have reviewed the triage vital signs and the nursing notes.  Pertinent labs & imaging results that were available during my care of the patient were reviewed by me and considered in my medical decision making (see chart for details).    MDM Rules/Calculators/A&P  80 year old female presenting to the ED with reported altered mental status.  EMS reports family called due to change from baseline, but could not specify what specifically.  She does have known history of dementia.  On arrival to ED patient is without complaint.  She is awake, alert, oriented to self, time, location, and surroundings.  She is able to answer questions and follow commands appropriately.  She is  afebrile without any infectious type symptoms.  Will obtain labs, urinalysis, CT head, and  chest x-ray.  Imaging is reassuring without any acute findings.  Chemistry with creatinine of 3.76 which appears around her baseline.  She has a chronic thrombocytopenia which is unchanged from prior.  UA with bacteria, 21-50 WBC, large leuks.  Given her age and reported change in mental status, will treat with course of keflex.  Patient remains without complaint here in the ED with stable VS.  Daughter at bedside now, patient appears at baseline.  Feel she is stable for discharge back home with treatment for UTI.   Close follow-up with PCP.  Return here for new concerns.  Shared visit with attending physician, Dr. Wyvonnia Dusky, who agrees with plan.  Final Clinical Impression(s) / ED Diagnoses Final diagnoses:  Generalized weakness  Acute cystitis without hematuria    Rx / DC Orders ED Discharge Orders    None       Larene Pickett, PA-C 06/08/20 4782    Ezequiel Essex, MD 06/12/20 1245

## 2020-06-07 NOTE — ED Triage Notes (Signed)
Patient presents from home, family reports increased confusion but EMS was unable to obtain further from them. Reports she is intermittently crying out for her grandkids, but this is baseline at times. Patient denies pain or complaints. Hx of dementia. Vitals stable. AAOx3. Disoriented to year. No unilateral/focal neuro deficits noted.

## 2020-06-08 ENCOUNTER — Emergency Department (HOSPITAL_COMMUNITY): Payer: Medicare HMO

## 2020-06-08 DIAGNOSIS — M2550 Pain in unspecified joint: Secondary | ICD-10-CM | POA: Diagnosis not present

## 2020-06-08 DIAGNOSIS — R531 Weakness: Secondary | ICD-10-CM | POA: Diagnosis not present

## 2020-06-08 DIAGNOSIS — J90 Pleural effusion, not elsewhere classified: Secondary | ICD-10-CM | POA: Diagnosis not present

## 2020-06-08 DIAGNOSIS — R4182 Altered mental status, unspecified: Secondary | ICD-10-CM | POA: Diagnosis not present

## 2020-06-08 DIAGNOSIS — I10 Essential (primary) hypertension: Secondary | ICD-10-CM | POA: Diagnosis not present

## 2020-06-08 DIAGNOSIS — N3000 Acute cystitis without hematuria: Secondary | ICD-10-CM | POA: Diagnosis not present

## 2020-06-08 DIAGNOSIS — I517 Cardiomegaly: Secondary | ICD-10-CM | POA: Diagnosis not present

## 2020-06-08 DIAGNOSIS — Z7401 Bed confinement status: Secondary | ICD-10-CM | POA: Diagnosis not present

## 2020-06-08 LAB — CBC WITH DIFFERENTIAL/PLATELET
Abs Immature Granulocytes: 0.03 10*3/uL (ref 0.00–0.07)
Basophils Absolute: 0 10*3/uL (ref 0.0–0.1)
Basophils Relative: 1 %
Eosinophils Absolute: 0.2 10*3/uL (ref 0.0–0.5)
Eosinophils Relative: 4 %
HCT: 38.1 % (ref 36.0–46.0)
Hemoglobin: 11.9 g/dL — ABNORMAL LOW (ref 12.0–15.0)
Immature Granulocytes: 1 %
Lymphocytes Relative: 24 %
Lymphs Abs: 1.1 10*3/uL (ref 0.7–4.0)
MCH: 32.5 pg (ref 26.0–34.0)
MCHC: 31.2 g/dL (ref 30.0–36.0)
MCV: 104.1 fL — ABNORMAL HIGH (ref 80.0–100.0)
Monocytes Absolute: 0.3 10*3/uL (ref 0.1–1.0)
Monocytes Relative: 7 %
Neutro Abs: 2.9 10*3/uL (ref 1.7–7.7)
Neutrophils Relative %: 63 %
Platelets: 68 10*3/uL — ABNORMAL LOW (ref 150–400)
RBC: 3.66 MIL/uL — ABNORMAL LOW (ref 3.87–5.11)
RDW: 21.2 % — ABNORMAL HIGH (ref 11.5–15.5)
WBC: 4.5 10*3/uL (ref 4.0–10.5)
nRBC: 1.5 % — ABNORMAL HIGH (ref 0.0–0.2)

## 2020-06-08 LAB — URINALYSIS, ROUTINE W REFLEX MICROSCOPIC
Bilirubin Urine: NEGATIVE
Glucose, UA: NEGATIVE mg/dL
Ketones, ur: NEGATIVE mg/dL
Nitrite: NEGATIVE
Protein, ur: NEGATIVE mg/dL
Specific Gravity, Urine: 1.009 (ref 1.005–1.030)
pH: 5 (ref 5.0–8.0)

## 2020-06-08 LAB — BASIC METABOLIC PANEL
Anion gap: 12 (ref 5–15)
BUN: 98 mg/dL — ABNORMAL HIGH (ref 8–23)
CO2: 23 mmol/L (ref 22–32)
Calcium: 8.9 mg/dL (ref 8.9–10.3)
Chloride: 96 mmol/L — ABNORMAL LOW (ref 98–111)
Creatinine, Ser: 3.76 mg/dL — ABNORMAL HIGH (ref 0.44–1.00)
GFR, Estimated: 12 mL/min — ABNORMAL LOW (ref >60)
Glucose, Bld: 104 mg/dL — ABNORMAL HIGH (ref 70–99)
Potassium: 4 mmol/L (ref 3.5–5.1)
Sodium: 131 mmol/L — ABNORMAL LOW (ref 135–145)

## 2020-06-08 MED ORDER — CEPHALEXIN 250 MG PO CAPS
500.0000 mg | ORAL_CAPSULE | Freq: Once | ORAL | Status: AC
  Administered 2020-06-08: 500 mg via ORAL
  Filled 2020-06-08: qty 2

## 2020-06-08 MED ORDER — CEPHALEXIN 500 MG PO CAPS: 500.0000 mg | ORAL_CAPSULE | Freq: Two times a day (BID) | ORAL | 0 refills | Status: DC

## 2020-06-08 NOTE — ED Notes (Signed)
Urine culture sent with urine sample. 

## 2020-06-08 NOTE — ED Notes (Signed)
Discharge instructions provided to daughter at bedside. Unit secretary to call [facility].

## 2020-06-08 NOTE — ED Notes (Signed)
Patient in CT

## 2020-06-08 NOTE — Discharge Instructions (Signed)
Antibiotics sent to pharmacy, please take as directed. Try to push oral fluids, especially water to keep hydrated. Follow-up with your primary care doctor. Return here for new concerns.

## 2020-06-08 NOTE — ED Notes (Signed)
ptar called 

## 2020-06-09 LAB — URINE CULTURE: Culture: <10000 — AB

## 2020-06-10 DIAGNOSIS — E1122 Type 2 diabetes mellitus with diabetic chronic kidney disease: Secondary | ICD-10-CM | POA: Diagnosis not present

## 2020-06-10 DIAGNOSIS — J9601 Acute respiratory failure with hypoxia: Secondary | ICD-10-CM | POA: Diagnosis not present

## 2020-06-10 DIAGNOSIS — I130 Hypertensive heart and chronic kidney disease with heart failure and stage 1 through stage 4 chronic kidney disease, or unspecified chronic kidney disease: Secondary | ICD-10-CM | POA: Diagnosis not present

## 2020-06-10 DIAGNOSIS — D631 Anemia in chronic kidney disease: Secondary | ICD-10-CM | POA: Diagnosis not present

## 2020-06-10 DIAGNOSIS — R6520 Severe sepsis without septic shock: Secondary | ICD-10-CM | POA: Diagnosis not present

## 2020-06-10 DIAGNOSIS — N184 Chronic kidney disease, stage 4 (severe): Secondary | ICD-10-CM | POA: Diagnosis not present

## 2020-06-10 DIAGNOSIS — N179 Acute kidney failure, unspecified: Secondary | ICD-10-CM | POA: Diagnosis not present

## 2020-06-10 DIAGNOSIS — I5041 Acute combined systolic (congestive) and diastolic (congestive) heart failure: Secondary | ICD-10-CM | POA: Diagnosis not present

## 2020-06-10 DIAGNOSIS — A419 Sepsis, unspecified organism: Secondary | ICD-10-CM | POA: Diagnosis not present

## 2020-06-11 ENCOUNTER — Other Ambulatory Visit: Payer: Medicare HMO | Admitting: Hospice

## 2020-06-11 DIAGNOSIS — A419 Sepsis, unspecified organism: Secondary | ICD-10-CM | POA: Diagnosis not present

## 2020-06-11 DIAGNOSIS — D631 Anemia in chronic kidney disease: Secondary | ICD-10-CM | POA: Diagnosis not present

## 2020-06-11 DIAGNOSIS — R6520 Severe sepsis without septic shock: Secondary | ICD-10-CM | POA: Diagnosis not present

## 2020-06-11 DIAGNOSIS — N179 Acute kidney failure, unspecified: Secondary | ICD-10-CM | POA: Diagnosis not present

## 2020-06-11 DIAGNOSIS — N184 Chronic kidney disease, stage 4 (severe): Secondary | ICD-10-CM | POA: Diagnosis not present

## 2020-06-11 DIAGNOSIS — E1122 Type 2 diabetes mellitus with diabetic chronic kidney disease: Secondary | ICD-10-CM | POA: Diagnosis not present

## 2020-06-11 DIAGNOSIS — I130 Hypertensive heart and chronic kidney disease with heart failure and stage 1 through stage 4 chronic kidney disease, or unspecified chronic kidney disease: Secondary | ICD-10-CM | POA: Diagnosis not present

## 2020-06-11 DIAGNOSIS — I5041 Acute combined systolic (congestive) and diastolic (congestive) heart failure: Secondary | ICD-10-CM | POA: Diagnosis not present

## 2020-06-11 DIAGNOSIS — J9601 Acute respiratory failure with hypoxia: Secondary | ICD-10-CM | POA: Diagnosis not present

## 2020-06-12 ENCOUNTER — Other Ambulatory Visit: Payer: Medicare HMO | Admitting: Hospice

## 2020-06-16 DIAGNOSIS — I5041 Acute combined systolic (congestive) and diastolic (congestive) heart failure: Secondary | ICD-10-CM | POA: Diagnosis not present

## 2020-06-16 DIAGNOSIS — R6520 Severe sepsis without septic shock: Secondary | ICD-10-CM | POA: Diagnosis not present

## 2020-06-16 DIAGNOSIS — E1122 Type 2 diabetes mellitus with diabetic chronic kidney disease: Secondary | ICD-10-CM | POA: Diagnosis not present

## 2020-06-16 DIAGNOSIS — D631 Anemia in chronic kidney disease: Secondary | ICD-10-CM | POA: Diagnosis not present

## 2020-06-16 DIAGNOSIS — J9601 Acute respiratory failure with hypoxia: Secondary | ICD-10-CM | POA: Diagnosis not present

## 2020-06-16 DIAGNOSIS — A419 Sepsis, unspecified organism: Secondary | ICD-10-CM | POA: Diagnosis not present

## 2020-06-16 DIAGNOSIS — I130 Hypertensive heart and chronic kidney disease with heart failure and stage 1 through stage 4 chronic kidney disease, or unspecified chronic kidney disease: Secondary | ICD-10-CM | POA: Diagnosis not present

## 2020-06-16 DIAGNOSIS — N184 Chronic kidney disease, stage 4 (severe): Secondary | ICD-10-CM | POA: Diagnosis not present

## 2020-06-16 DIAGNOSIS — N179 Acute kidney failure, unspecified: Secondary | ICD-10-CM | POA: Diagnosis not present

## 2020-06-17 DIAGNOSIS — H35033 Hypertensive retinopathy, bilateral: Secondary | ICD-10-CM | POA: Diagnosis not present

## 2020-06-17 DIAGNOSIS — Z961 Presence of intraocular lens: Secondary | ICD-10-CM | POA: Diagnosis not present

## 2020-06-17 DIAGNOSIS — H401131 Primary open-angle glaucoma, bilateral, mild stage: Secondary | ICD-10-CM | POA: Diagnosis not present

## 2020-06-18 DIAGNOSIS — N184 Chronic kidney disease, stage 4 (severe): Secondary | ICD-10-CM | POA: Diagnosis not present

## 2020-06-18 DIAGNOSIS — J9601 Acute respiratory failure with hypoxia: Secondary | ICD-10-CM | POA: Diagnosis not present

## 2020-06-18 DIAGNOSIS — E1122 Type 2 diabetes mellitus with diabetic chronic kidney disease: Secondary | ICD-10-CM | POA: Diagnosis not present

## 2020-06-18 DIAGNOSIS — D631 Anemia in chronic kidney disease: Secondary | ICD-10-CM | POA: Diagnosis not present

## 2020-06-18 DIAGNOSIS — A419 Sepsis, unspecified organism: Secondary | ICD-10-CM | POA: Diagnosis not present

## 2020-06-18 DIAGNOSIS — N179 Acute kidney failure, unspecified: Secondary | ICD-10-CM | POA: Diagnosis not present

## 2020-06-18 DIAGNOSIS — R6520 Severe sepsis without septic shock: Secondary | ICD-10-CM | POA: Diagnosis not present

## 2020-06-18 DIAGNOSIS — I5041 Acute combined systolic (congestive) and diastolic (congestive) heart failure: Secondary | ICD-10-CM | POA: Diagnosis not present

## 2020-06-18 DIAGNOSIS — I130 Hypertensive heart and chronic kidney disease with heart failure and stage 1 through stage 4 chronic kidney disease, or unspecified chronic kidney disease: Secondary | ICD-10-CM | POA: Diagnosis not present

## 2020-06-19 ENCOUNTER — Encounter: Payer: Self-pay | Admitting: Internal Medicine

## 2020-06-19 ENCOUNTER — Other Ambulatory Visit: Payer: Self-pay

## 2020-06-19 ENCOUNTER — Emergency Department (HOSPITAL_COMMUNITY)
Admission: EM | Admit: 2020-06-19 | Discharge: 2020-06-20 | Disposition: A | Discharge status: Home / Self Care | Payer: Medicare HMO | Attending: Emergency Medicine | Admitting: Emergency Medicine

## 2020-06-19 ENCOUNTER — Emergency Department (HOSPITAL_COMMUNITY): Payer: Medicare HMO

## 2020-06-19 ENCOUNTER — Encounter (HOSPITAL_COMMUNITY): Payer: Self-pay

## 2020-06-19 DIAGNOSIS — Z7982 Long term (current) use of aspirin: Secondary | ICD-10-CM | POA: Insufficient documentation

## 2020-06-19 DIAGNOSIS — R6520 Severe sepsis without septic shock: Secondary | ICD-10-CM | POA: Diagnosis not present

## 2020-06-19 DIAGNOSIS — R5383 Other fatigue: Secondary | ICD-10-CM | POA: Insufficient documentation

## 2020-06-19 DIAGNOSIS — A419 Sepsis, unspecified organism: Secondary | ICD-10-CM | POA: Diagnosis not present

## 2020-06-19 DIAGNOSIS — I130 Hypertensive heart and chronic kidney disease with heart failure and stage 1 through stage 4 chronic kidney disease, or unspecified chronic kidney disease: Secondary | ICD-10-CM | POA: Diagnosis not present

## 2020-06-19 DIAGNOSIS — F0390 Unspecified dementia without behavioral disturbance: Secondary | ICD-10-CM | POA: Insufficient documentation

## 2020-06-19 DIAGNOSIS — Z79899 Other long term (current) drug therapy: Secondary | ICD-10-CM | POA: Diagnosis not present

## 2020-06-19 DIAGNOSIS — N184 Chronic kidney disease, stage 4 (severe): Secondary | ICD-10-CM | POA: Diagnosis not present

## 2020-06-19 DIAGNOSIS — R9431 Abnormal electrocardiogram [ECG] [EKG]: Secondary | ICD-10-CM | POA: Diagnosis not present

## 2020-06-19 DIAGNOSIS — E1122 Type 2 diabetes mellitus with diabetic chronic kidney disease: Secondary | ICD-10-CM | POA: Diagnosis not present

## 2020-06-19 DIAGNOSIS — R410 Disorientation, unspecified: Secondary | ICD-10-CM | POA: Diagnosis not present

## 2020-06-19 DIAGNOSIS — I129 Hypertensive chronic kidney disease with stage 1 through stage 4 chronic kidney disease, or unspecified chronic kidney disease: Secondary | ICD-10-CM | POA: Insufficient documentation

## 2020-06-19 DIAGNOSIS — J9601 Acute respiratory failure with hypoxia: Secondary | ICD-10-CM | POA: Diagnosis not present

## 2020-06-19 DIAGNOSIS — R531 Weakness: Secondary | ICD-10-CM | POA: Insufficient documentation

## 2020-06-19 DIAGNOSIS — N179 Acute kidney failure, unspecified: Secondary | ICD-10-CM | POA: Diagnosis not present

## 2020-06-19 DIAGNOSIS — I5041 Acute combined systolic (congestive) and diastolic (congestive) heart failure: Secondary | ICD-10-CM | POA: Diagnosis not present

## 2020-06-19 DIAGNOSIS — E1140 Type 2 diabetes mellitus with diabetic neuropathy, unspecified: Secondary | ICD-10-CM | POA: Insufficient documentation

## 2020-06-19 DIAGNOSIS — I517 Cardiomegaly: Secondary | ICD-10-CM | POA: Diagnosis not present

## 2020-06-19 DIAGNOSIS — D631 Anemia in chronic kidney disease: Secondary | ICD-10-CM | POA: Diagnosis not present

## 2020-06-19 DIAGNOSIS — N3001 Acute cystitis with hematuria: Secondary | ICD-10-CM

## 2020-06-19 DIAGNOSIS — R5381 Other malaise: Secondary | ICD-10-CM | POA: Diagnosis not present

## 2020-06-19 DIAGNOSIS — R296 Repeated falls: Secondary | ICD-10-CM

## 2020-06-19 LAB — COMPREHENSIVE METABOLIC PANEL
ALT: 49 U/L — ABNORMAL HIGH (ref 0–44)
AST: 145 U/L — ABNORMAL HIGH (ref 15–41)
Albumin: 2.8 g/dL — ABNORMAL LOW (ref 3.5–5.0)
Alkaline Phosphatase: 99 U/L (ref 38–126)
Anion gap: 7 (ref 5–15)
BUN: 85 mg/dL — ABNORMAL HIGH (ref 8–23)
CO2: 31 mmol/L (ref 22–32)
Calcium: 9 mg/dL (ref 8.9–10.3)
Chloride: 96 mmol/L — ABNORMAL LOW (ref 98–111)
Creatinine, Ser: 3.26 mg/dL — ABNORMAL HIGH (ref 0.44–1.00)
GFR, Estimated: 14 mL/min — ABNORMAL LOW (ref >60)
Glucose, Bld: 86 mg/dL (ref 70–99)
Potassium: 3.4 mmol/L — ABNORMAL LOW (ref 3.5–5.1)
Sodium: 134 mmol/L — ABNORMAL LOW (ref 135–145)
Total Bilirubin: 1.2 mg/dL (ref 0.3–1.2)
Total Protein: 6.2 g/dL — ABNORMAL LOW (ref 6.5–8.1)

## 2020-06-19 LAB — CBC WITH DIFFERENTIAL/PLATELET
Abs Immature Granulocytes: 0.02 10*3/uL (ref 0.00–0.07)
Basophils Absolute: 0.1 10*3/uL (ref 0.0–0.1)
Basophils Relative: 1 %
Eosinophils Absolute: 0.2 10*3/uL (ref 0.0–0.5)
Eosinophils Relative: 4 %
HCT: 30.1 % — ABNORMAL LOW (ref 36.0–46.0)
Hemoglobin: 9.9 g/dL — ABNORMAL LOW (ref 12.0–15.0)
Immature Granulocytes: 1 %
Lymphocytes Relative: 47 %
Lymphs Abs: 2.1 10*3/uL (ref 0.7–4.0)
MCH: 32.9 pg (ref 26.0–34.0)
MCHC: 32.9 g/dL (ref 30.0–36.0)
MCV: 100 fL (ref 80.0–100.0)
Monocytes Absolute: 0.4 10*3/uL (ref 0.1–1.0)
Monocytes Relative: 8 %
Neutro Abs: 1.7 10*3/uL (ref 1.7–7.7)
Neutrophils Relative %: 39 %
Platelets: 98 10*3/uL — ABNORMAL LOW (ref 150–400)
RBC: 3.01 MIL/uL — ABNORMAL LOW (ref 3.87–5.11)
RDW: 20.5 % — ABNORMAL HIGH (ref 11.5–15.5)
WBC: 4.4 10*3/uL (ref 4.0–10.5)
nRBC: 0.7 % — ABNORMAL HIGH (ref 0.0–0.2)

## 2020-06-19 NOTE — ED Triage Notes (Signed)
Emergency Medicine Provider Triage Evaluation Note  Erin Liu , a 80 y.o. female  was evaluated in triage.  Pt complains of fatigue.  Patient has dementia  Review of Systems  Positive: Fatigue Negative: Chest pain, abdominal pain, cough, shortness of breath, back pain, dysuria  Physical Exam  BP 105/66 (BP Location: Right Arm)   Pulse 82   Temp 97.6 F (36.4 C) (Oral)   Resp 17   SpO2 100%  Gen:   Awake, no distress   HEENT:  Atraumatic  Resp:  Normal effort  Cardiac:  Normal rate  Abd:   Nondistended, nontender  MSK:   Moves extremities without difficulty  Neuro:  Speech clear   Medical Decision Making  Medically screening exam initiated at 8:08 PM.  Appropriate orders placed.  Erin Liu was informed that the remainder of the evaluation will be completed by another provider, this initial triage assessment does not replace that evaluation, and the importance of remaining in the ED until their evaluation is complete.  Clinical Impression     Lyndel Safe 06/19/20 2008

## 2020-06-19 NOTE — ED Triage Notes (Signed)
Patient reports "I just don't feel good", hx of dementia, daughter reports she noticed some increased weakness.

## 2020-06-20 ENCOUNTER — Emergency Department (HOSPITAL_COMMUNITY): Payer: Medicare HMO

## 2020-06-20 DIAGNOSIS — R410 Disorientation, unspecified: Secondary | ICD-10-CM | POA: Diagnosis not present

## 2020-06-20 DIAGNOSIS — R531 Weakness: Secondary | ICD-10-CM | POA: Diagnosis not present

## 2020-06-20 DIAGNOSIS — R5383 Other fatigue: Secondary | ICD-10-CM | POA: Diagnosis not present

## 2020-06-20 DIAGNOSIS — R5381 Other malaise: Secondary | ICD-10-CM | POA: Diagnosis not present

## 2020-06-20 DIAGNOSIS — R9431 Abnormal electrocardiogram [ECG] [EKG]: Secondary | ICD-10-CM | POA: Diagnosis not present

## 2020-06-20 DIAGNOSIS — N3001 Acute cystitis with hematuria: Secondary | ICD-10-CM | POA: Diagnosis not present

## 2020-06-20 LAB — URINALYSIS, ROUTINE W REFLEX MICROSCOPIC
Bilirubin Urine: NEGATIVE
Glucose, UA: NEGATIVE mg/dL
Ketones, ur: NEGATIVE mg/dL
Nitrite: NEGATIVE
Protein, ur: NEGATIVE mg/dL
Specific Gravity, Urine: 1.011 (ref 1.005–1.030)
pH: 5 (ref 5.0–8.0)

## 2020-06-20 LAB — AMMONIA: Ammonia: 16 umol/L (ref 9–35)

## 2020-06-20 LAB — TSH: TSH: 6.107 u[IU]/mL — ABNORMAL HIGH (ref 0.350–4.500)

## 2020-06-20 LAB — TROPONIN I (HIGH SENSITIVITY): Troponin I (High Sensitivity): 119 ng/L (ref <18)

## 2020-06-20 MED ORDER — CEPHALEXIN 500 MG PO CAPS: 500.0000 mg | ORAL_CAPSULE | Freq: Four times a day (QID) | ORAL | 0 refills | Status: AC

## 2020-06-20 MED ORDER — ACETAMINOPHEN 325 MG PO TABS
650.0000 mg | ORAL_TABLET | Freq: Once | ORAL | Status: AC
  Administered 2020-06-20: 650 mg via ORAL
  Filled 2020-06-20: qty 2

## 2020-06-20 NOTE — ED Provider Notes (Addendum)
Patient signed out to me is pending troponin and urinalysis.  Troponin appears to be trending downwards from prior levels.  Patient continues have no complaints of chest discomfort.  Urinalysis positive, will be prescribed antibiotics to go home with.  Patient and family states that prefer to be discharged home and follow-up on outpatient basis.  Advised immediate return for worsening symptoms fevers pain or any additional concerns, otherwise advised close follow-up with her doctors in 2 to 3 days.   Luna Fuse, MD 06/20/20 1115    Luna Fuse, MD 06/20/20 [phone]

## 2020-06-20 NOTE — Discharge Instructions (Addendum)
Call your primary care doctor or specialist as discussed in the next 2-3 days.   Return immediately back to the ER if:  Your symptoms worsen within the next 12-24 hours. You develop new symptoms such as new fevers, persistent vomiting, new pain, shortness of breath, or new weakness or numbness, or if you have any other concerns.  

## 2020-06-20 NOTE — ED Notes (Signed)
Returned from ct scan at this time

## 2020-06-20 NOTE — ED Notes (Signed)
Per pt family at bedside, Pt is unable to walk since her fall in March. Family states she is assisted at home to stand and pivot to bed, couch or bedside commode. Pt will not walk d/t fear of falling. Pt is receiving PT/OT at home.

## 2020-06-20 NOTE — ED Notes (Signed)
Provider at bedside

## 2020-06-20 NOTE — ED Notes (Signed)
Attempted PIV x 2 unsuccessful at this time a venipuncture done to obtain labs

## 2020-06-20 NOTE — ED Provider Notes (Signed)
[facility] EMERGENCY DEPARTMENT Provider Note   CSN: [id number] Arrival date & time: 06/19/20  1953     History Chief Complaint  Patient presents with  . Fatigue    Erin Liu is a 80 y.o. female.  Level 5 caveat for dementia.  Patient unable to give much of a history.  Daughter states she is here because she said she "did not feel good" earlier this evening.  She was unable to specify what.  Has no complaints and appears to be at her baseline per daughter at bedside.  She denies any chest pain, shortness of breath, fever, chills, nausea, vomiting, pain with urination, blood in the urine.  No difficulty breathing or difficulty swallowing.  Patient with history of dementia. Recent prolonged hospitalization with CHF LVEF 20 to 25% with moderately reduced right ventricular function, ARF, cardiogenic shock. Transitioning to palliative care by records.  Daughter states patient is undergoing palliative care but is not hospice.  She is being.  At home with home health. Has been eating and drinking well.  She denies any vomiting.  She has needs assistance with walking and transfers at baseline. Daughter has not noticed any odor to her urine or blood in her urine. She was treated for UTI approximately 2 weeks ago  The history is provided by the patient and a relative.       Past Medical History:  Diagnosis Date  . ANEMIA, NORMOCYTIC   . Arthritis    back  . BREAST CYST, HX OF   . CARPAL TUNNEL SYNDROME, BILATERAL   . DIABETES MELLITUS, TYPE II   . Glaucoma   . HYPERLIPIDEMIA   . HYPERTENSION   . OBESITY   . PERIPHERAL NEUROPATHY   . RENAL FAILURE, CHRONIC    Stage 4    Patient Active Problem List   Diagnosis Date Noted  . Cardiogenic shock ([place])   . Non-ST elevation (NSTEMI) myocardial infarction ([place])   . Renal failure (ARF), acute on chronic (HCC) 05/15/2020  . Hypotension 05/15/2020  . CAP (community acquired pneumonia) 05/15/2020  . Acute  metabolic encephalopathy 84/66/5993  . Sepsis ([place]) 05/15/2020  . Fall at home, initial encounter 05/15/2020  . Prolonged QT interval 05/15/2020  . Demand ischemia ([place])   . Recurrent falls 04/28/2020  . Weight loss, unintentional 09/11/2019  . Memory disorder 07/18/2019  . Hand tingling 07/24/2018  . Physical deconditioning 01/13/2018  . Tremor of both hands 10/10/2017  . Urinary incontinence 10/10/2017  . Vasovagal syncope 10/02/2017  . Gout 07/12/2017  . B12 deficiency 04/11/2017  . Cough 08/26/2015  . Lumbago 02/25/2015  . Essential tremor   . Glaucoma 01/08/2010  . Chronic kidney disease (CKD), stage IV (severe) ([place]) 12/10/2008  . DM (diabetes mellitus), secondary, uncontrolled, with renal complications ([place]) 57/03/7791  . Diabetic neuropathy ([place]) 02/22/2006  . Dyslipidemia 02/22/2006  . 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 CARPAL TUNNEL SYNDROME, BILATERAL 02/22/2006    Past Surgical History:  Procedure Laterality Date  . AV FISTULA PLACEMENT Left 05/23/2018   Procedure: ARTERIOVENOUS (AV) FISTULA CREATION LEFT ARM;  Surgeon: Waynetta Sandy, MD;  Location: [facility];  Service: Vascular;  Laterality: Left;  . BASCILIC VEIN TRANSPOSITION Left 08/03/2018   Procedure: SECOND STAGE BASILIC VEIN TRANSPOSITION LEFT ARM;  Surgeon: Waynetta Sandy, MD;  Location: [facility];  Service: Vascular;  Laterality: Left;  . CATARACT EXTRACTION BILATERAL W/ ANTERIOR VITRECTOMY  05/2010  . COLONOSCOPY     [facility]/7-8 years  OB History   No obstetric history on file.     Family History  Problem Relation Age of Onset  . Arthritis Son   . Diabetes Son   . Hypertension Son   . Colon cancer Son 79  . Neuropathy Son        chemotherapy related  . Heart attack Mother        Died age 46s  . Heart attack Father        No details 32s  . Heart disease Sister        Slight heart attack, died age 36    Social History   Tobacco Use  . Smoking status: Never Smoker  .  Smokeless tobacco: Never Used  Vaping Use  . Vaping Use: Never used  Substance Use Topics  . Alcohol use: No  . Drug use: No    Home Medications Prior to Admission medications   Medication Sig Start Date End Date Taking? Authorizing Provider  acetaminophen (TYLENOL) 500 MG tablet Take 500 mg by mouth 2 (two) times daily.    [provider]  allopurinol (ZYLOPRIM) 100 MG tablet TAKE 2 TABLETS BY MOUTH DAILY Patient taking differently: Take 200 mg by mouth daily. 05/13/20   Binnie Rail, MD  aspirin EC 81 MG EC tablet Take 1 tablet (81 mg total) by mouth daily. Swallow whole. 05/22/20   Pokhrel, Corrie Mckusick, MD  atorvastatin (LIPITOR) 40 MG tablet TAKE 1 TABLET BY MOUTH DAILY Patient taking differently: Take 40 mg by mouth daily. 11/05/19   Binnie Rail, MD  cephALEXin (KEFLEX) 500 MG capsule Take 1 capsule (500 mg total) by mouth 2 (two) times daily. 06/08/20   Larene Pickett, PA-C  Cyanocobalamin (VITAMIN B-12) 2000 MCG TBCR Take 1 tablet (2,000 mcg total) by mouth daily. 07/24/18   Binnie Rail, MD  donepezil (ARICEPT) 5 MG tablet Take 1 tablet (5 mg total) by mouth at bedtime. 04/28/20   Burns, Claudina Lick, MD  latanoprost (XALATAN) 0.005 % ophthalmic solution Place 1 drop into both eyes at bedtime. Patient not taking: No sig reported 12/09/17   [provider]  torsemide 40 MG TABS Take 40 mg by mouth 2 (two) times daily. 05/21/20 06/20/20  Pokhrel, Corrie Mckusick, MD    Allergies    Lisinopril  Review of Systems   Review of Systems  Unable to perform ROS: Dementia    Physical Exam Updated Vital Signs BP 104/66 (BP Location: Right Arm)   Pulse 80   Temp 97.8 F (36.6 C) (Oral)   Resp 15   Ht 5\' 5"  (1.651 m)   Wt 82.6 kg   SpO2 100%   BMI 30.30 kg/m   Physical Exam Vitals and nursing note reviewed.  Constitutional:      General: She is not in acute distress.    Appearance: She is well-developed. She is not ill-appearing.  HENT:     Head: Normocephalic and  atraumatic.     Mouth/Throat:     Pharynx: No oropharyngeal exudate.  Eyes:     Conjunctiva/sclera: Conjunctivae normal.     Pupils: Pupils are equal, round, and reactive to light.  Neck:     Comments: No meningismus. Cardiovascular:     Rate and Rhythm: Normal rate and regular rhythm.     Heart sounds: Normal heart sounds. No murmur heard.   Pulmonary:     Effort: Pulmonary effort is normal. No respiratory distress.     Breath sounds: Normal breath sounds.  Abdominal:     Palpations: Abdomen is soft.     Tenderness: There is no abdominal tenderness. There is no guarding or rebound.  Musculoskeletal:        General: No tenderness. Normal range of motion.     Cervical back: Normal range of motion and neck supple.  Skin:    General: Skin is warm.  Neurological:     Mental Status: She is alert.     Cranial Nerves: No cranial nerve deficit.     Motor: No abnormal muscle tone.     Coordination: Coordination normal.     Comments:  Oriented person and place.  Follows commands moves all extremities equally  Psychiatric:        Behavior: Behavior normal.     ED Results / Procedures / Treatments   Labs (all labs ordered are listed, but only abnormal results are displayed) Labs Reviewed  CBC WITH DIFFERENTIAL/PLATELET - Abnormal; Notable for the following components:      Result Value   RBC 3.01 (*)    Hemoglobin 9.9 (*)    HCT 30.1 (*)    RDW 20.5 (*)    Platelets 98 (*)    nRBC 0.7 (*)    All other components within normal limits  COMPREHENSIVE METABOLIC PANEL - Abnormal; Notable for the following components:   Sodium 134 (*)    Potassium 3.4 (*)    Chloride 96 (*)    BUN 85 (*)    Creatinine, Ser 3.26 (*)    Total Protein 6.2 (*)    Albumin 2.8 (*)    AST 145 (*)    ALT 49 (*)    GFR, Estimated 14 (*)    All other components within normal limits  URINE CULTURE  URINALYSIS, ROUTINE W REFLEX MICROSCOPIC  TSH  AMMONIA  TROPONIN I (HIGH SENSITIVITY)    EKG EKG  Interpretation  Date/Time:  Thursday June 19 2020 20:13:15 EDT Ventricular Rate:  84 PR Interval:  170 QRS Duration: 188 QT Interval:  464 QTC Calculation: 548 R Axis:   -56 Text Interpretation: Sinus rhythm with occasional Premature ventricular complexes Left axis deviation Left bundle branch block Abnormal ECG No significant change was found Confirmed by Ezequiel Essex [id number]) on 06/20/2020 4:15:44 AM   Radiology DG Chest 2 View  Result Date: 06/19/2020 CLINICAL DATA:  Fatigue, malaise EXAM: CHEST - 2 VIEW COMPARISON:  06/08/2020 FINDINGS: Cardiomegaly. Minimal diffuse interstitial pulmonary opacity. The visualized skeletal structures are unremarkable. IMPRESSION: Cardiomegaly with minimal diffuse interstitial pulmonary opacity, possibly mild edema. No focal airspace opacity. Electronically Signed   By: Eddie Candle M.D.   On: 06/19/2020 21:09   CT Head Wo Contrast  Result Date: 06/20/2020 CLINICAL DATA:  80 year old female with malaise, increased weakness, delirium. EXAM: CT HEAD WITHOUT CONTRAST TECHNIQUE: Contiguous axial images were obtained from the base of the skull through the vertex without intravenous contrast. COMPARISON:  Head CT 06/07/2020 and earlier.  Brain MRI 05/15/2020. FINDINGS: Brain: Stable cerebral volume. No midline shift, ventriculomegaly, mass effect, evidence of mass lesion, intracranial hemorrhage or evidence of cortically based acute infarction. Patchy and confluent bilateral cerebral white matter hypodensity with comparatively mild heterogeneity in the bilateral deep gray nuclei is stable from last month. No cortical encephalomalacia identified. Vascular: Calcified atherosclerosis at the skull base. No suspicious intracranial vascular hyperdensity. Skull: Stable, negative. Sinuses/Orbits: Visualized paranasal sinuses and mastoids are stable and well aerated. Other: Stable, negative orbit and scalp soft tissues. IMPRESSION: 1. No acute intracranial abnormality. 2.  Stable non contrast CT appearance of chronic cerebral small vessel disease since last month. Electronically Signed   By: Genevie Ann M.D.   On: 06/20/2020 04:32    Procedures Procedures   Medications Ordered in ED Medications - No data to display  ED Course  I have reviewed the triage vital signs and the nursing notes.  Pertinent labs & imaging results that were available during my care of the patient were reviewed by me and considered in my medical decision making (see chart for details).    MDM Rules/Calculators/A&P                         Patient here with generalized weakness and "just not feeling well".  She has no specific complaints.  Her vitals are stable.  She is afebrile.  No hypoxia increased work of breathing.  She with recent prolonged hospitalization for heart failure as well as renal failure.  She was determined not to be a dialysis or ischemic cardiac evaluation candidate.  CT head shows stable appearance.  Labs are reassuring with improving creatinine.  Hemoglobin near baseline  During previous hospitalization no ischemic work-up was planned by cardiology given patient's comorbidities intervention dementia.  Troponins likely remain positive from previous values.  UA pending at shift change. CXR stable.  Hemoglobin has downtrending slightly.  Will check Hemoccult.  Stable vital signs in the ED.  At baseline per her daughter.  No new concerns.  Care to be transferred at shift change.   Urinalysis and troponin pending at shift change.  Patient could be considered possible failed outpatient treatment of UTI if her UTI symptoms still persist.  Dr. Almyra Free to assume care at shift change and reassess. Final Clinical Impression(s) / ED Diagnoses Final diagnoses:  None    Rx / DC Orders ED Discharge Orders    None       Nevaeh Casillas, Annie Main, MD 06/20/20 [phone]

## 2020-06-22 DIAGNOSIS — N184 Chronic kidney disease, stage 4 (severe): Secondary | ICD-10-CM | POA: Diagnosis not present

## 2020-06-22 DIAGNOSIS — J189 Pneumonia, unspecified organism: Secondary | ICD-10-CM | POA: Diagnosis not present

## 2020-06-22 DIAGNOSIS — I214 Non-ST elevation (NSTEMI) myocardial infarction: Secondary | ICD-10-CM | POA: Diagnosis not present

## 2020-06-22 DIAGNOSIS — R5381 Other malaise: Secondary | ICD-10-CM | POA: Diagnosis not present

## 2020-06-23 DIAGNOSIS — A419 Sepsis, unspecified organism: Secondary | ICD-10-CM | POA: Diagnosis not present

## 2020-06-23 DIAGNOSIS — N184 Chronic kidney disease, stage 4 (severe): Secondary | ICD-10-CM | POA: Diagnosis not present

## 2020-06-23 DIAGNOSIS — D631 Anemia in chronic kidney disease: Secondary | ICD-10-CM | POA: Diagnosis not present

## 2020-06-23 DIAGNOSIS — E1122 Type 2 diabetes mellitus with diabetic chronic kidney disease: Secondary | ICD-10-CM | POA: Diagnosis not present

## 2020-06-23 DIAGNOSIS — I5041 Acute combined systolic (congestive) and diastolic (congestive) heart failure: Secondary | ICD-10-CM | POA: Diagnosis not present

## 2020-06-23 DIAGNOSIS — N179 Acute kidney failure, unspecified: Secondary | ICD-10-CM | POA: Diagnosis not present

## 2020-06-23 DIAGNOSIS — R6520 Severe sepsis without septic shock: Secondary | ICD-10-CM | POA: Diagnosis not present

## 2020-06-23 DIAGNOSIS — J9601 Acute respiratory failure with hypoxia: Secondary | ICD-10-CM | POA: Diagnosis not present

## 2020-06-23 DIAGNOSIS — I130 Hypertensive heart and chronic kidney disease with heart failure and stage 1 through stage 4 chronic kidney disease, or unspecified chronic kidney disease: Secondary | ICD-10-CM | POA: Diagnosis not present

## 2020-06-24 DIAGNOSIS — J9601 Acute respiratory failure with hypoxia: Secondary | ICD-10-CM | POA: Diagnosis not present

## 2020-06-24 DIAGNOSIS — R6520 Severe sepsis without septic shock: Secondary | ICD-10-CM | POA: Diagnosis not present

## 2020-06-24 DIAGNOSIS — E1122 Type 2 diabetes mellitus with diabetic chronic kidney disease: Secondary | ICD-10-CM | POA: Diagnosis not present

## 2020-06-24 DIAGNOSIS — N184 Chronic kidney disease, stage 4 (severe): Secondary | ICD-10-CM | POA: Diagnosis not present

## 2020-06-24 DIAGNOSIS — I130 Hypertensive heart and chronic kidney disease with heart failure and stage 1 through stage 4 chronic kidney disease, or unspecified chronic kidney disease: Secondary | ICD-10-CM | POA: Diagnosis not present

## 2020-06-24 DIAGNOSIS — D631 Anemia in chronic kidney disease: Secondary | ICD-10-CM | POA: Diagnosis not present

## 2020-06-24 DIAGNOSIS — N179 Acute kidney failure, unspecified: Secondary | ICD-10-CM | POA: Diagnosis not present

## 2020-06-24 DIAGNOSIS — A419 Sepsis, unspecified organism: Secondary | ICD-10-CM | POA: Diagnosis not present

## 2020-06-24 DIAGNOSIS — I5041 Acute combined systolic (congestive) and diastolic (congestive) heart failure: Secondary | ICD-10-CM | POA: Diagnosis not present

## 2020-06-25 DIAGNOSIS — J9601 Acute respiratory failure with hypoxia: Secondary | ICD-10-CM | POA: Diagnosis not present

## 2020-06-25 DIAGNOSIS — R6520 Severe sepsis without septic shock: Secondary | ICD-10-CM | POA: Diagnosis not present

## 2020-06-25 DIAGNOSIS — D631 Anemia in chronic kidney disease: Secondary | ICD-10-CM | POA: Diagnosis not present

## 2020-06-25 DIAGNOSIS — I130 Hypertensive heart and chronic kidney disease with heart failure and stage 1 through stage 4 chronic kidney disease, or unspecified chronic kidney disease: Secondary | ICD-10-CM | POA: Diagnosis not present

## 2020-06-25 DIAGNOSIS — I5041 Acute combined systolic (congestive) and diastolic (congestive) heart failure: Secondary | ICD-10-CM | POA: Diagnosis not present

## 2020-06-25 DIAGNOSIS — N184 Chronic kidney disease, stage 4 (severe): Secondary | ICD-10-CM | POA: Diagnosis not present

## 2020-06-25 DIAGNOSIS — N179 Acute kidney failure, unspecified: Secondary | ICD-10-CM | POA: Diagnosis not present

## 2020-06-25 DIAGNOSIS — A419 Sepsis, unspecified organism: Secondary | ICD-10-CM | POA: Diagnosis not present

## 2020-06-25 DIAGNOSIS — E1122 Type 2 diabetes mellitus with diabetic chronic kidney disease: Secondary | ICD-10-CM | POA: Diagnosis not present

## 2020-07-01 DIAGNOSIS — A419 Sepsis, unspecified organism: Secondary | ICD-10-CM | POA: Diagnosis not present

## 2020-07-01 DIAGNOSIS — N390 Urinary tract infection, site not specified: Secondary | ICD-10-CM | POA: Insufficient documentation

## 2020-07-01 DIAGNOSIS — I5022 Chronic systolic (congestive) heart failure: Secondary | ICD-10-CM | POA: Insufficient documentation

## 2020-07-01 DIAGNOSIS — N179 Acute kidney failure, unspecified: Secondary | ICD-10-CM | POA: Diagnosis not present

## 2020-07-01 DIAGNOSIS — E1122 Type 2 diabetes mellitus with diabetic chronic kidney disease: Secondary | ICD-10-CM | POA: Diagnosis not present

## 2020-07-01 DIAGNOSIS — D631 Anemia in chronic kidney disease: Secondary | ICD-10-CM | POA: Diagnosis not present

## 2020-07-01 DIAGNOSIS — I130 Hypertensive heart and chronic kidney disease with heart failure and stage 1 through stage 4 chronic kidney disease, or unspecified chronic kidney disease: Secondary | ICD-10-CM | POA: Diagnosis not present

## 2020-07-01 DIAGNOSIS — J9601 Acute respiratory failure with hypoxia: Secondary | ICD-10-CM | POA: Diagnosis not present

## 2020-07-01 DIAGNOSIS — N184 Chronic kidney disease, stage 4 (severe): Secondary | ICD-10-CM | POA: Diagnosis not present

## 2020-07-01 DIAGNOSIS — R6520 Severe sepsis without septic shock: Secondary | ICD-10-CM | POA: Diagnosis not present

## 2020-07-01 DIAGNOSIS — I5041 Acute combined systolic (congestive) and diastolic (congestive) heart failure: Secondary | ICD-10-CM | POA: Diagnosis not present

## 2020-07-01 NOTE — Patient Instructions (Addendum)
  Blood work was ordered.     Medications changes include : none     A referral was ordered for the heart failure clinic.    Someone from their office will call you to schedule an appointment.    Please followup in 3 months

## 2020-07-01 NOTE — Progress Notes (Signed)
Subjective:    Patient ID: Erin Liu, female    DOB: June 15, 1940, 80 y.o.   MRN: [id number]  HPI The patient is here for follow up from the ED.   ED 06/07/20 - went for AMS - she was more "off than normal".  No fever, abd pain, nausea or other symptoms. Was eating/drinking well.  She had no complaints.  CXR, Ct head w/o acute changes.  Cr at baseline - 3.76.  UA with UTI. She was discharged home on Keflex.  ED 06/19/20: went for fatigue.  She did not feel good earlier than evening.  No specific complaints. Was at baseline per daughter.  Denies any CP, sob, fever, urine symptoms, abd pain.  Ct head stable.  EKG s/o change.  Labs stable, hgb which was lower.  CXR stable. UA positive - prescribed keflex.   She fell this week out of the bed.  She denied any injury.  She has been up and walking since then.  She is not complaining of any pain.   Her feet are very swollen.  This is the first time her daughter has noticed this.  She is taking all of her medication as prescribed.  She is compliant with a low-sodium diet.  She has not noted any shortness of breath.  constipation - improved with prune juice.  She is eating well.   No longer has nurse or PT.  Her daughter is doing exercises with her.  She does have palliative care coming.  She can stand up with assistance. Her daughter feels she is getting stronger.    Estimated Creatinine Clearance: 14.3 mL/min (A) (by C-G formula based on SCr of 3.26 mg/dL (H)).     Medications and allergies reviewed with patient and updated if appropriate.  Patient Active Problem List   Diagnosis Date Noted  . Severe mitral regurgitation 07/02/2020  . Severe tricuspid regurgitation 07/02/2020  . Chronic systolic heart failure ([place]) 07/01/2020  . Recurrent UTI 07/01/2020  . Non-ST elevation (NSTEMI) myocardial infarction ([place])   . CAP (community acquired pneumonia) 05/15/2020  . Acute metabolic encephalopathy 93/90/3009  . Fall at home, initial  encounter 05/15/2020  . Prolonged QT interval 05/15/2020  . Demand ischemia ([place])   . Recurrent falls 04/28/2020  . Weight loss, unintentional 09/11/2019  . Memory disorder 07/18/2019  . Physical deconditioning 01/13/2018  . Tremor of both hands 10/10/2017  . Urinary incontinence 10/10/2017  . Vasovagal syncope 10/02/2017  . Gout 07/12/2017  . B12 deficiency 04/11/2017  . Lumbago 02/25/2015  . Essential tremor   . Glaucoma 01/08/2010  . Chronic kidney disease (CKD), stage IV (severe) ([place]) 12/10/2008  . DM (diabetes mellitus), secondary, uncontrolled, with renal complications ([place]) 23/30/0762  . Diabetic neuropathy ([place]) 02/22/2006  . Dyslipidemia 02/22/2006  . 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 CARPAL TUNNEL SYNDROME, BILATERAL 02/22/2006    Current Outpatient Medications on File Prior to Visit  Medication Sig Dispense Refill  . acetaminophen (TYLENOL) 500 MG tablet Take 500 mg by mouth 2 (two) times daily.    Marland Kitchen allopurinol (ZYLOPRIM) 100 MG tablet TAKE 2 TABLETS BY MOUTH DAILY (Patient taking differently: Take 100 mg by mouth 2 (two) times daily.) 180 tablet 0  . aspirin EC 81 MG EC tablet Take 1 tablet (81 mg total) by mouth daily. Swallow whole. 30 tablet 11  . atorvastatin (LIPITOR) 40 MG tablet TAKE 1 TABLET BY MOUTH DAILY (Patient taking differently: Take 40 mg by mouth daily.) 90 tablet 1  .  Cyanocobalamin (VITAMIN B-12) 2000 MCG TBCR Take 1 tablet (2,000 mcg total) by mouth daily. 90 tablet 1  . [DISCONTINUED] donepezil (ARICEPT) 5 MG tablet Take 1 tablet (5 mg total) by mouth at bedtime. 30 tablet 5  . torsemide 40 MG TABS Take 40 mg by mouth 2 (two) times daily. 60 tablet 0   No current facility-administered medications on file prior to visit.    Past Medical History:  Diagnosis Date  . ANEMIA, NORMOCYTIC   . Arthritis    back  . BREAST CYST, HX OF   . CARPAL TUNNEL SYNDROME, BILATERAL   . DIABETES MELLITUS, TYPE II   . Glaucoma   . HYPERLIPIDEMIA   .  HYPERTENSION   . OBESITY   . PERIPHERAL NEUROPATHY   . RENAL FAILURE, CHRONIC    Stage 4    Past Surgical History:  Procedure Laterality Date  . AV FISTULA PLACEMENT Left 05/23/2018   Procedure: ARTERIOVENOUS (AV) FISTULA CREATION LEFT ARM;  Surgeon: Waynetta Sandy, MD;  Location: [facility];  Service: Vascular;  Laterality: Left;  . BASCILIC VEIN TRANSPOSITION Left 08/03/2018   Procedure: SECOND STAGE BASILIC VEIN TRANSPOSITION LEFT ARM;  Surgeon: Waynetta Sandy, MD;  Location: [facility];  Service: Vascular;  Laterality: Left;  . CATARACT EXTRACTION BILATERAL W/ ANTERIOR VITRECTOMY  05/2010  . COLONOSCOPY     [facility]/7-8 years    Social History   Socioeconomic History  . Marital status: Widowed    Spouse name: Not on file  . Number of children: 3  . Years of education: Not on file  . Highest education level: Not on file  Occupational History  . Not on file  Tobacco Use  . Smoking status: Never Smoker  . Smokeless tobacco: Never Used  Vaping Use  . Vaping Use: Never used  Substance and Sexual Activity  . Alcohol use: No  . Drug use: No  . Sexual activity: Not Currently  Other Topics Concern  . Not on file  Social History Narrative   Widowed, lives with son. Retired Medical illustrator   Social Determinants of Radio broadcast assistant Strain: Bright   . Difficulty of Paying Living Expenses: Not hard at all  Food Insecurity: No Food Insecurity  . Worried About Charity fundraiser in the Last Year: Never true  . Ran Out of Food in the Last Year: Never true  Transportation Needs: No Transportation Needs  . Lack of Transportation (Medical): No  . Lack of Transportation (Non-Medical): No  Physical Activity: Inactive  . Days of Exercise per Week: 0 days  . Minutes of Exercise per Session: 0 min  Stress: No Stress Concern Present  . Feeling of Stress : Not at all  Social Connections: Unknown  . Frequency of Communication with Friends and Family: More than  three times a week  . Frequency of Social Gatherings with Friends and Family: Once a week  . Attends Religious Services: Patient refused  . Active Member of Clubs or Organizations: Patient refused  . Attends Archivist Meetings: Patient refused  . Marital Status: Widowed    Family History  Problem Relation Age of Onset  . Arthritis Son   . Diabetes Son   . Hypertension Son   . Colon cancer Son 51  . Neuropathy Son        chemotherapy related  . Heart attack Mother        Died age 5s  . Heart attack Father  No details 80s  . Heart disease Sister        Slight heart attack, died age 32    Review of Systems  Constitutional: Negative for chills and fever.  Respiratory: Negative for cough, shortness of breath and wheezing.   Cardiovascular: Positive for leg swelling. Negative for chest pain and palpitations.  Gastrointestinal: Positive for constipation. Negative for abdominal pain, blood in stool (no blood in the stool) and nausea.  Endocrine: Positive for cold intolerance.  Genitourinary: Negative for dysuria.  Neurological: Negative for light-headedness and headaches.       Objective:   Vitals:   07/02/20 1051  BP: 126/72  Pulse: 72  Temp: 98.2 F (36.8 C)  SpO2: 95%   BP Readings from Last 3 Encounters:  07/02/20 126/72  06/20/20 120/87  06/08/20 116/73   Wt Readings from Last 3 Encounters:  07/02/20 173 lb (78.5 kg)  06/19/20 182 lb 1.6 oz (82.6 kg)  05/21/20 182 lb 1.6 oz (82.6 kg)   Body mass index is 28.79 kg/m.   Physical Exam    Constitutional: Appears well-developed and well-nourished. No distress.  Has her eyes closed most of the time, but will awakens easily and will respond to questions.  Follows commands. HENT:  Head: Normocephalic and atraumatic.  Neck: Neck supple. No tracheal deviation present. No thyromegaly present.  No cervical lymphadenopathy Cardiovascular: Normal rate, regular rhythm, split S2.  3/6 systolic murmur  heard. No carotid bruit .  1+ bilateral lower extremity pitting edema that includes the posterior upper legs Pulmonary/Chest: Effort normal and breath sounds normal. No respiratory distress. No has no wheezes. No rales. Abdomen: Soft, nontender Skin: Skin is warm and dry. Not diaphoretic.       Assessment & Plan:    See Problem List for Assessment and Plan of chronic medical problems.    This visit occurred during the SARS-CoV-2 public health emergency.  Safety protocols were in place, including screening questions prior to the visit, additional usage of staff PPE, and extensive cleaning of exam room while observing appropriate contact time as indicated for disinfecting solutions.

## 2020-07-02 ENCOUNTER — Telehealth: Payer: Self-pay

## 2020-07-02 ENCOUNTER — Other Ambulatory Visit: Payer: Self-pay

## 2020-07-02 ENCOUNTER — Ambulatory Visit (INDEPENDENT_AMBULATORY_CARE_PROVIDER_SITE_OTHER): Payer: Medicare HMO | Admitting: Internal Medicine

## 2020-07-02 ENCOUNTER — Encounter: Payer: Self-pay | Admitting: Internal Medicine

## 2020-07-02 VITALS — BP 126/72 | HR 72 | Temp 98.2°F | Ht 65.0 in | Wt 173.0 lb

## 2020-07-02 DIAGNOSIS — M109 Gout, unspecified: Secondary | ICD-10-CM

## 2020-07-02 DIAGNOSIS — IMO0002 Reserved for concepts with insufficient information to code with codable children: Secondary | ICD-10-CM

## 2020-07-02 DIAGNOSIS — N390 Urinary tract infection, site not specified: Secondary | ICD-10-CM

## 2020-07-02 DIAGNOSIS — R413 Other amnesia: Secondary | ICD-10-CM | POA: Diagnosis not present

## 2020-07-02 DIAGNOSIS — E1329 Other specified diabetes mellitus with other diabetic kidney complication: Secondary | ICD-10-CM

## 2020-07-02 DIAGNOSIS — R7989 Other specified abnormal findings of blood chemistry: Secondary | ICD-10-CM | POA: Diagnosis not present

## 2020-07-02 DIAGNOSIS — I071 Rheumatic tricuspid insufficiency: Secondary | ICD-10-CM | POA: Insufficient documentation

## 2020-07-02 DIAGNOSIS — N184 Chronic kidney disease, stage 4 (severe): Secondary | ICD-10-CM | POA: Diagnosis not present

## 2020-07-02 DIAGNOSIS — W19XXXA Unspecified fall, initial encounter: Secondary | ICD-10-CM

## 2020-07-02 DIAGNOSIS — E1365 Other specified diabetes mellitus with hyperglycemia: Secondary | ICD-10-CM | POA: Diagnosis not present

## 2020-07-02 DIAGNOSIS — E785 Hyperlipidemia, unspecified: Secondary | ICD-10-CM

## 2020-07-02 DIAGNOSIS — Y92009 Unspecified place in unspecified non-institutional (private) residence as the place of occurrence of the external cause: Secondary | ICD-10-CM

## 2020-07-02 DIAGNOSIS — I5022 Chronic systolic (congestive) heart failure: Secondary | ICD-10-CM | POA: Diagnosis not present

## 2020-07-02 DIAGNOSIS — E0843 Diabetes mellitus due to underlying condition with diabetic autonomic (poly)neuropathy: Secondary | ICD-10-CM

## 2020-07-02 DIAGNOSIS — I340 Nonrheumatic mitral (valve) insufficiency: Secondary | ICD-10-CM | POA: Insufficient documentation

## 2020-07-02 LAB — COMPREHENSIVE METABOLIC PANEL
ALT: 51 U/L — ABNORMAL HIGH (ref 0–35)
AST: 124 U/L — ABNORMAL HIGH (ref 0–37)
Albumin: 3.2 g/dL — ABNORMAL LOW (ref 3.5–5.2)
Alkaline Phosphatase: 101 U/L (ref 39–117)
BUN: 89 mg/dL (ref 6–23)
CO2: 24 mEq/L (ref 19–32)
Calcium: 9.4 mg/dL (ref 8.4–10.5)
Chloride: 90 mEq/L — ABNORMAL LOW (ref 96–112)
Creatinine, Ser: 3.62 mg/dL — ABNORMAL HIGH (ref 0.40–1.20)
GFR: 11.39 mL/min — CL (ref >60.00)
Glucose, Bld: 79 mg/dL (ref 70–99)
Potassium: 3.9 mEq/L (ref 3.5–5.1)
Sodium: 129 mEq/L — ABNORMAL LOW (ref 135–145)
Total Bilirubin: 1.1 mg/dL (ref 0.2–1.2)
Total Protein: 6.5 g/dL (ref 6.0–8.3)

## 2020-07-02 LAB — CBC WITH DIFFERENTIAL/PLATELET
Basophils Absolute: 0.1 10*3/uL (ref 0.0–0.1)
Basophils Relative: 1.2 % (ref 0.0–3.0)
Eosinophils Absolute: 0.1 10*3/uL (ref 0.0–0.7)
Eosinophils Relative: 2.3 % (ref 0.0–5.0)
HCT: 29.3 % — ABNORMAL LOW (ref 36.0–46.0)
Hemoglobin: 9.8 g/dL — ABNORMAL LOW (ref 12.0–15.0)
Lymphocytes Relative: 31.4 % (ref 12.0–46.0)
Lymphs Abs: 1.4 10*3/uL (ref 0.7–4.0)
MCHC: 33.6 g/dL (ref 30.0–36.0)
MCV: 100.8 fl — ABNORMAL HIGH (ref 78.0–100.0)
Monocytes Absolute: 0.5 10*3/uL (ref 0.1–1.0)
Monocytes Relative: 10.1 % (ref 3.0–12.0)
Neutro Abs: 2.5 10*3/uL (ref 1.4–7.7)
Neutrophils Relative %: 55 % (ref 43.0–77.0)
Platelets: 113 10*3/uL — ABNORMAL LOW (ref 150.0–400.0)
RBC: 2.9 Mil/uL — ABNORMAL LOW (ref 3.87–5.11)
RDW: 22.6 % — ABNORMAL HIGH (ref 11.5–15.5)
WBC: 4.6 10*3/uL (ref 4.0–10.5)

## 2020-07-02 LAB — BRAIN NATRIURETIC PEPTIDE: Pro B Natriuretic peptide (BNP): >4951 pg/mL — ABNORMAL HIGH (ref 0.0–100.0)

## 2020-07-02 LAB — HEMOGLOBIN A1C: Hgb A1c MFr Bld: 6 % (ref 4.6–6.5)

## 2020-07-02 LAB — TSH: TSH: 6.83 u[IU]/mL — ABNORMAL HIGH (ref 0.35–4.50)

## 2020-07-02 NOTE — Assessment & Plan Note (Signed)
Chronic Has not had any gout symptoms Continue allopurinol 200 mg daily

## 2020-07-02 NOTE — Assessment & Plan Note (Signed)
No UTI symptoms today Did receive 2 courses of Keflex over the past few weeks

## 2020-07-02 NOTE — Assessment & Plan Note (Signed)
Chronic Stage IV Not a candidate for hemodialysis Will monitor

## 2020-07-02 NOTE — Assessment & Plan Note (Signed)
Chronic Diet controlled Check A1c  

## 2020-07-02 NOTE — Assessment & Plan Note (Signed)
Chronic Has chronic bilateral foot lower extremity pain which is likely related to the neuropathy

## 2020-07-02 NOTE — Assessment & Plan Note (Signed)
Acute She fell earlier this week without obvious injury On exam there does not seem to be any concerning pain or injuries and she has walked at home with assistance without complaints of pain so I will not evaluate further

## 2020-07-02 NOTE — Assessment & Plan Note (Signed)
Chronic Continue Aricept 5 mg daily Will not increase at this time-not sure how much it would help and concerned about causing cardiac arrhythmias

## 2020-07-02 NOTE — Assessment & Plan Note (Signed)
Chronic EF 20-25% with global LV hypokinesis, severe MR, severe TR Currently taking torsemide 40 mg twice daily Limited by BP with other medications When she left the hospital last month daughter deferred hospice care-she does currently have palliative care She does have increased edema on exam-we will check blood work CBC, CMP, BNP Discussed with her that I am not sure what else we can do-increasing the torsemide will likely further damage her kidneys She does want everything done for her mother and feels she is doing better Will refer to heart failure clinic to see if they have any options, but I understand that is likely not the case

## 2020-07-02 NOTE — Assessment & Plan Note (Signed)
Chronic Continue atorvastatin 40 mg

## 2020-07-02 NOTE — Telephone Encounter (Signed)
This is stable for her

## 2020-07-03 MED ORDER — LEVOTHYROXINE SODIUM 25 MCG PO TABS: 25.0000 ug | ORAL_TABLET | Freq: Every day | ORAL | 3 refills | Status: AC

## 2020-07-03 NOTE — Addendum Note (Signed)
Addended by: Binnie Rail on: 07/03/2020 09:22 PM   Modules accepted: Orders

## 2020-07-04 DIAGNOSIS — D631 Anemia in chronic kidney disease: Secondary | ICD-10-CM | POA: Diagnosis not present

## 2020-07-04 DIAGNOSIS — J9601 Acute respiratory failure with hypoxia: Secondary | ICD-10-CM | POA: Diagnosis not present

## 2020-07-04 DIAGNOSIS — I5041 Acute combined systolic (congestive) and diastolic (congestive) heart failure: Secondary | ICD-10-CM | POA: Diagnosis not present

## 2020-07-04 DIAGNOSIS — R6520 Severe sepsis without septic shock: Secondary | ICD-10-CM | POA: Diagnosis not present

## 2020-07-04 DIAGNOSIS — N179 Acute kidney failure, unspecified: Secondary | ICD-10-CM | POA: Diagnosis not present

## 2020-07-04 DIAGNOSIS — A419 Sepsis, unspecified organism: Secondary | ICD-10-CM | POA: Diagnosis not present

## 2020-07-04 DIAGNOSIS — I130 Hypertensive heart and chronic kidney disease with heart failure and stage 1 through stage 4 chronic kidney disease, or unspecified chronic kidney disease: Secondary | ICD-10-CM | POA: Diagnosis not present

## 2020-07-04 DIAGNOSIS — E1122 Type 2 diabetes mellitus with diabetic chronic kidney disease: Secondary | ICD-10-CM | POA: Diagnosis not present

## 2020-07-04 DIAGNOSIS — N184 Chronic kidney disease, stage 4 (severe): Secondary | ICD-10-CM | POA: Diagnosis not present

## 2020-07-07 DIAGNOSIS — I5041 Acute combined systolic (congestive) and diastolic (congestive) heart failure: Secondary | ICD-10-CM | POA: Diagnosis not present

## 2020-07-07 DIAGNOSIS — J9601 Acute respiratory failure with hypoxia: Secondary | ICD-10-CM | POA: Diagnosis not present

## 2020-07-07 DIAGNOSIS — N179 Acute kidney failure, unspecified: Secondary | ICD-10-CM | POA: Diagnosis not present

## 2020-07-07 DIAGNOSIS — R6520 Severe sepsis without septic shock: Secondary | ICD-10-CM | POA: Diagnosis not present

## 2020-07-07 DIAGNOSIS — I130 Hypertensive heart and chronic kidney disease with heart failure and stage 1 through stage 4 chronic kidney disease, or unspecified chronic kidney disease: Secondary | ICD-10-CM | POA: Diagnosis not present

## 2020-07-07 DIAGNOSIS — E1122 Type 2 diabetes mellitus with diabetic chronic kidney disease: Secondary | ICD-10-CM | POA: Diagnosis not present

## 2020-07-07 DIAGNOSIS — N184 Chronic kidney disease, stage 4 (severe): Secondary | ICD-10-CM | POA: Diagnosis not present

## 2020-07-07 DIAGNOSIS — D631 Anemia in chronic kidney disease: Secondary | ICD-10-CM | POA: Diagnosis not present

## 2020-07-07 DIAGNOSIS — A419 Sepsis, unspecified organism: Secondary | ICD-10-CM | POA: Diagnosis not present

## 2020-07-08 ENCOUNTER — Telehealth (HOSPITAL_COMMUNITY): Payer: Self-pay | Admitting: Internal Medicine

## 2020-07-08 ENCOUNTER — Encounter: Payer: Self-pay | Admitting: Internal Medicine

## 2020-07-08 NOTE — Telephone Encounter (Signed)
[facility]) called to f/u w/referral sent in work que, please advise, if you have any questions, she can be reached [phone]. Thanks

## 2020-07-10 DIAGNOSIS — N184 Chronic kidney disease, stage 4 (severe): Secondary | ICD-10-CM | POA: Diagnosis not present

## 2020-07-10 DIAGNOSIS — D631 Anemia in chronic kidney disease: Secondary | ICD-10-CM | POA: Diagnosis not present

## 2020-07-10 DIAGNOSIS — R6520 Severe sepsis without septic shock: Secondary | ICD-10-CM | POA: Diagnosis not present

## 2020-07-10 DIAGNOSIS — J9601 Acute respiratory failure with hypoxia: Secondary | ICD-10-CM | POA: Diagnosis not present

## 2020-07-10 DIAGNOSIS — I130 Hypertensive heart and chronic kidney disease with heart failure and stage 1 through stage 4 chronic kidney disease, or unspecified chronic kidney disease: Secondary | ICD-10-CM | POA: Diagnosis not present

## 2020-07-10 DIAGNOSIS — N179 Acute kidney failure, unspecified: Secondary | ICD-10-CM | POA: Diagnosis not present

## 2020-07-10 DIAGNOSIS — I5041 Acute combined systolic (congestive) and diastolic (congestive) heart failure: Secondary | ICD-10-CM | POA: Diagnosis not present

## 2020-07-10 DIAGNOSIS — A419 Sepsis, unspecified organism: Secondary | ICD-10-CM | POA: Diagnosis not present

## 2020-07-10 DIAGNOSIS — E1122 Type 2 diabetes mellitus with diabetic chronic kidney disease: Secondary | ICD-10-CM | POA: Diagnosis not present

## 2020-07-12 ENCOUNTER — Encounter: Payer: Self-pay | Admitting: Internal Medicine

## 2020-07-14 ENCOUNTER — Other Ambulatory Visit: Payer: Self-pay

## 2020-07-14 ENCOUNTER — Ambulatory Visit (INDEPENDENT_AMBULATORY_CARE_PROVIDER_SITE_OTHER): Payer: Medicare HMO

## 2020-07-14 DIAGNOSIS — E785 Hyperlipidemia, unspecified: Secondary | ICD-10-CM | POA: Diagnosis not present

## 2020-07-14 DIAGNOSIS — D539 Nutritional anemia, unspecified: Secondary | ICD-10-CM | POA: Diagnosis not present

## 2020-07-14 DIAGNOSIS — E538 Deficiency of other specified B group vitamins: Secondary | ICD-10-CM

## 2020-07-14 DIAGNOSIS — E1329 Other specified diabetes mellitus with other diabetic kidney complication: Secondary | ICD-10-CM

## 2020-07-14 DIAGNOSIS — I5022 Chronic systolic (congestive) heart failure: Secondary | ICD-10-CM

## 2020-07-14 DIAGNOSIS — M109 Gout, unspecified: Secondary | ICD-10-CM

## 2020-07-14 DIAGNOSIS — N184 Chronic kidney disease, stage 4 (severe): Secondary | ICD-10-CM | POA: Diagnosis not present

## 2020-07-14 DIAGNOSIS — IMO0002 Reserved for concepts with insufficient information to code with codable children: Secondary | ICD-10-CM

## 2020-07-14 DIAGNOSIS — E1365 Other specified diabetes mellitus with hyperglycemia: Secondary | ICD-10-CM

## 2020-07-14 NOTE — Patient Instructions (Addendum)
Heart Failure, Self-Care Heart failure is a serious condition. The following information explains the things you need to do to take care of yourself after a heart failure diagnosis. You may be asked to change your diet, take certain medicines, and make other lifestyle changes in order to stay as healthy as possible. Your health care provider may also give you more specific instructions. If you have problems or questions, contact your health care provider. What are the risks? Having heart failure puts you at higher risk for certain problems. These problems can get worse if you do not take good care of yourself. Problems may include:  Damage to the kidneys, liver, or lungs.  Malnutrition.  Abnormal heart rhythms.  Blood clotting issues that could cause a stroke. Supplies needed:  Scale for monitoring weight.  Blood pressure monitor.  Notebook.  Medicines. How to care for yourself when you have heart failure Medicines Take over-the-counter and prescription medicines only as told by your health care provider. Medicines reduce the workload of your heart, slow the progression of heart failure, and improve symptoms. Take your medicines every day.  Do not stop taking your medicine unless your health care provider tells you to do so.  Do not skip any dose of medicine.  Refill your prescriptions before you run out of medicine.  Talk with your health care provider if you cannot afford your medicines. Eating and drinking  Eat heart-healthy foods. Talk with a dietitian to make an eating plan that is right for you. ? Limit salt (sodium) if told by your health care provider. Sodium restriction may reduce symptoms of heart failure. Ask a dietitian to recommend heart-healthy seasonings. ? Use healthy cooking methods instead of frying. Healthy methods include roasting, grilling, broiling, baking, poaching, steaming, and stir-frying. ? Choose foods that contain no trans fat and are low in saturated  fat and cholesterol. Healthy choices include fresh or frozen fruits and vegetables, fish, lean meats, legumes, fat-free or low-fat dairy products, and whole-grain or high-fiber foods.  Limit your fluid intake, if directed by your health care provider. Fluid restriction may reduce symptoms of heart failure.   Alcohol use  Do not drink alcohol if: ? Your health care provider tells you not to drink. ? Your heart was damaged by alcohol, or you have severe heart failure. ? You are pregnant, may be pregnant, or are planning to become pregnant.  If you drink alcohol: ? Limit how much you have to:  0-1 drink a day for women.  0-2 drinks a day for men. ? Know how much alcohol is in your drink. In the [place] one drink equals one 12 oz bottle of beer (355 mL), one 5 oz glass of wine (148 mL), or one 1 oz glass of hard liquor (44 mL). Lifestyle  Do not use any products that contain nicotine or tobacco. These products include cigarettes, chewing tobacco, and vaping devices, such as e-cigarettes. If you need help quitting, ask your health care provider. ? Do not use nicotine gum or patches before talking to your health care provider.  Do not use illegal drugs.  Work with your health care provider to safely reach the right body weight.  Do physical activity if told by your health care provider. Talk to your health care provider before you begin an exercise if: ? You are an older adult. ? You have severe heart failure.  Learn to manage stress. If you need help to do this, ask your health care provider.  Participate  in or seek physical rehabilitation as needed to keep or improve your independence and quality of life.  Participate in a cardiac rehabilitation program, which is a treatment program to improve your health and well-being through exercise training, education, and counseling.  Plan rest periods when you get tired.   Monitoring important information  Weigh yourself every day. This will  help you to notice if too much fluid is building up in your body. ? Weigh yourself every morning after you urinate and before you eat breakfast. ? Wear the same amount of clothing each time you weigh yourself. ? Record your daily weight. Provide your health care provider with your weight record.  Monitor and record your pulse and blood pressure as told by your health care provider.   Dealing with extreme temperatures  If the weather is extremely hot: ? Avoid vigorous physical activity. ? Use air conditioning or fans, or find a cooler location. ? Avoid caffeine and alcohol. ? Wear loose-fitting, lightweight, and light-colored clothing.  If the weather is extremely cold: ? Avoid vigorous activity. ? Layer your clothes. ? Wear mittens or gloves, a hat, and a face covering when you go outside. ? Avoid alcohol. Follow these instructions at home:  Stay up to date with vaccines. Pneumococcal and flu (influenza) vaccines are especially important in preventing infections of the airways.  Keep all follow-up visits. This is important. Contact a health care provider if you:  Gain 2-3 lb (1-1.4 kg) in 24 hours or 5 lb (2.3 kg) in a week.  Have increasing shortness of breath.  Are unable to participate in your usual physical activities.  Get tired easily.  Cough more than normal, especially with physical activity.  Lose your appetite or feel nauseous.  Have any swelling or more swelling in areas such as your hands, feet, ankles, or abdomen.  Are unable to sleep because it is hard to breathe.  Feel like your heart is beating quickly (palpitations).  Become dizzy or light-headed when you stand up.  Have feelings of depression or sadness. Get help right away if you:  Have trouble breathing.  Notice, or your family notices, a change in your awareness, such as having trouble staying awake or concentrating.  Have pain or discomfort in your chest.  Have an episode of fainting  (syncope). These symptoms may represent a serious problem that is an emergency. Do not wait to see if the symptoms will go away. Get medical help right away. Call your local emergency services (911 in the [place] Do not drive yourself to the hospital. Summary  Heart failure is a serious condition. To care for yourself, you may be asked to change your diet, take certain medicines, and make other lifestyle changes.  Take your medicines every day. Do not stop taking them unless your health care provider tells you to do so.  Limit salt and eat heart-healthy foods, such as fresh or frozen fruits and vegetables, fish, lean meats, legumes, fat-free or low-fat dairy products, and whole-grain or high-fiber foods.  Ask your health care provider if you have any alcohol restrictions. You may have to stop drinking alcohol if you have severe heart failure.  Contact your health care provider if you notice problems, such as rapid weight gain or a fast heartbeat. Get help right away if you faint or have chest pain or trouble breathing. This information is not intended to replace advice given to you by your health care provider. Make sure you discuss any questions you have  with your health care provider. Document Revised: 09/15/2019 Document Reviewed: 09/15/2019 Elsevier Patient Education  [place].  Visit Information   PATIENT GOALS:  Goals Addressed   None     Consent to CCM Services: Erin Liu was given information about Chronic Care Management services today including:  1. CCM service includes personalized support from designated clinical staff supervised by her physician, including individualized plan of care and coordination with other care providers 2. 24/7 contact phone numbers for assistance for urgent and routine care needs. 3. Service will only be billed when office clinical staff spend 20 minutes or more in a month to coordinate care. 4. Only one practitioner may furnish and bill  the service in a calendar month. 5. The patient may stop CCM services at any time (effective at the end of the month) by phone call to the office staff. 6. The patient will be responsible for cost sharing (co-pay) of up to 20% of the service fee (after annual deductible is met).  Patient agreed to services and verbal consent obtained.   Patient verbalizes understanding of instructions provided today and agrees to view in [place].   Telephone follow up appointment with care management team member scheduled for:  3 weeks and 2 months    CLINICAL CARE PLAN: Patient Care Plan: Dementia (Adult)    Problem Identified: Harm or Injury   Priority: Medium    Long-Range Goal: Harm or Injury Prevented   Start Date: 06/03/2020  Expected End Date: 09/03/2020  This Visit's Progress: On track  Priority: Medium  Note:   Current Barriers:  Marland Kitchen Knowledge Deficits related to fall precautions in patient with memory loss  . Need for ongoing reinforcement of fall prevention strategies . Unable to independently perform ADLs and iADL's: requires assistance for all from family caregivers . Unable to self administer medications as prescribed- family administers medications, uses pill box . Care Coordination potential needs related to upcoming transfer of care to PACE program in a patient with memory loss/ dementia in patient that requires total care from family caregivers . Cognitive Deficits: decreased vision, memory loss Clinical Goal(s):  Over the next 3 months, patient's caregiver will: . verbalize improved adherence to prescribed treatment plan for decreasing falls as evidenced by caregiver reporting and review of EHR . verbalize using fall risk reduction strategies discussed Interventions:  . Collaboration with Binnie Rail, MD regarding development and update of comprehensive plan of care as evidenced by provider attestation and co-signature . Inter-disciplinary care team collaboration (see  longitudinal plan of care) . Provided written and verbal education re: Potential causes of falls and Fall prevention strategies . Reviewed medications and discussed potential side effects of medications such as dizziness and frequent urination . Assessed for s/s of orthostatic hypotension; discussed need to balance fluid intake to prevent fluid overload and dehydration . Assessed caregiver knowledge of fall risk prevention  . Provided patient's caregiver with verbal and printed educational materials related to fall risks/ prevention . Discussed caregiver's current plan to enroll patient in PACE program and provided positive reinforcement for proactive planning measures currently in process Self-Care Deficits:  . Unable to independently perform ADLs and iADL's: requires assistance for all from family caregivers . Unable to self administer medications as prescribed- family administers medications, uses pill box . Cognitive Deficits: decreased vision, memory loss Patient Goals:  . Always wear shoes or slippers with non-slip sole: Change positions slowly . Use a walker or wheelchair: caregivers stay with patient for all walking  tasks . Use nightlight in the bathroom, halls, continue assisting patient with visual prompting . Always wear low-heeled or flat shoes or slippers with nonskid soles  . Make a plan for tasks that require patient movement- goal is to keep patient and caregivers safe and fall-free . Continue working with home health services for PT/ OT Follow Up Plan:  . Telephone follow up appointment with care management team member scheduled for: Aug 01, 2020 . The patient has been provided with contact information for the care management team and has been advised to call with any health related questions or concerns.     Patient Care Plan: Heart Failure (Adult)    Problem Identified: Symptom Exacerbation (Heart Failure)   Priority: Medium    Long-Range Goal: Symptom Exacerbation  Prevented or Minimized   Start Date: 06/03/2020  Expected End Date: 09/03/2020  Recent Progress: On track  Priority: Medium  Note:   Current Barriers:  Marland Kitchen Knowledge deficit related to basic heart failure pathophysiology and self care management . Cognitive Deficits- memory loss . Unable to independently perform ADLs/ iADL's independently: strong supportive family assists with all care needs . Unable to self administer medications as prescribed- family provides medications and supervises medication administration . Weakness post- recent hospitalization Case Manager Clinical Goal(s):  Over the next 90 days, patient's caregiver will: Marland Kitchen Verbalize accurate signs/ symptoms of heart failure exacerbation in setting of being unable to weigh patient daily due to fall risk . Verbalize understanding of Heart Failure Action Plan and when to call doctor Interventions:  . Collaboration with Binnie Rail, MD regarding development and update of comprehensive plan of care as evidenced by provider attestation and co-signature . Inter-disciplinary care team collaboration (see longitudinal plan of care) . Basic overview and discussion of pathophysiology of Heart Failure reviewed with caregiver: caregiver has good general understanding of same and could benefit from ongoing reinforcement of same . Provided verbal education on low sodium diet, need to balance fluid intake with prevention of dehydration in setting of CHF and CKD . Reviewed role of diuretics in prevention of fluid overload and management of heart failure . Confirmed with caregiver that patient is active with [facility] Palliative Care team, reviewed yesterday's palliative care home visit with caregiver: caregiver /daughter works with [employer] [facility] team as Ecologist . Medication review completed: no concerns identified . Reviewed recent hospitalization and PCP virtual office visit with caregiver:  confirmed no questions, caregiver has good general understanding of post-hospital discharge plan of care . Assessed caregiver's ability to assist patient in obtaining daily weights- discussed need to ensure patient safety/ fall prevention: cautioned caregiver to not attempt daily weights if she is concerned for patient/ personal safety . Reviewed basic weight gain guidelines in setting of CHF along with corresponding action plan, should caregiver be able to begin monitoring and recording weights at home . Confirmed patient follows low-salt diet Self-Care Activities: . Patient verbalizes understanding of plan to continue actively participating with home health services, community palliative care team, with PACE program participation currently pending . Self administers medications as prescribed . Attends all scheduled provider appointments . Calls pharmacy for medication refills . Calls provider office for new concerns or questions Patient Goals: . Develop and follow rescue plan . Follow rescue plan if symptoms flare-up . Know when to call the doctor . Track symptoms and what helps feel better or worse . Dress right for the weather, hot or cold  . Follow activity or exercise  plan- continue working with home health PT and OT . Pace activity allowing for rest . Continue using salt in moderation . Watch for swelling in feet, ankles and legs every day; if you develop a new cough of shortness of breath, call your doctor right away Follow Up Plan:  . Telephone follow up appointment with care management team member scheduled for: Aug 01, 2020 and September 09, 2020 . The patient has been provided with contact information for the care management team and has been advised to call with any health related questions or concerns.     Patient Care Plan: CCM Pharmacy Care Plan    Problem Identified: CHF, Hyperlipidemia, DM, CKD, Dementia, B-12 anemia, and Gout   Priority: High  Onset Date: 07/14/2020     Long-Range Goal: Disease Management   Start Date: 07/14/2020  Expected End Date: 09/13/2020  This Visit's Progress: On track  Priority: High  Note:   Hyperlipidemia: (LDL goal < 70) -Uncontrolled -Current treatment: . Atorvastatin 29m daily  . ASA 856mdaily  -Medications previously tried: n/a  -Current dietary patterns: chicken, [id number] if cooking, omega 3 butter, avoidance of fried foods/ pork  -Educated on Cholesterol goals;  Benefits of statin for ASCVD risk reduction; Importance of limiting foods high in cholesterol; -Recommended to continue current medication Counseled on importance of diet in prevention of cholesterol accumulation   Diabetes (A1c goal <7%) -Controlled -Current medications: . N/a - DM is diet controlled  -Medications previously tried: n/a  - A1c well controlled and at goal - 6.0% -Educated on A1c and blood sugar goals; Carbohydrate counting and/or plate method -Counseled to check feet daily and get yearly eye exams -Recommended continuing current dietary restrictions and meal plan  Heart Failure (Goal: manage symptoms and prevent exacerbations) -Uncontrolled -Last ejection fraction: 20-25% (Date: 05/16/2020) -HF type: Systolic -Current treatment: . Torsemide 4050mID  -Medications previously tried: lisinopril - cough  -Current home BP/HR readings: not currently checking BP - previously was being monitored by PT/OT when they were coming once a week  -Current dietary habits: low sodium diet, typically drinking water and cranberry juice  -Current exercise habits: limited, patient needs assistance for any ambulation  -Educated on Benefits of medications for managing symptoms and prolonging life Proper diuretic administration and potassium supplementation Importance of blood pressure control -Counseled on importance of avoidance of salt in diet to prevent fluid build up - Patient to establish with HF clinic as discussed with last PCP visit   Gout  (Goal: Prevention of Acute Attack) -Controlled -Current treatment  . Allopurinol 100m38mice daily   . APAP 500mg52m  -Medications previously tried: n/a  -Recommended to continue current medication Counseled on avoidance of foods that can lead to gout flare up   Patient Goals/Self-Care Activities . Patient will:  - take medications as prescribed   Anemia (Vit B) (Goal: continue current control) -Controlled -Current treatment  . Vit B-12 - 2000mcg92mly  -Medications previously tried: n/a  -Recommended to continue current medication   Hypothyroidism (Goal: Establish Control, prevent complications ) -Uncontrolled - medication started with last PCP appointment  -Current treatment  . Levothyroxine 25mcg 46my  -Medications previously tried: n/a  -Recommended to continue current medication   Dementia (Goal: Prevention of Disease progression) -Not ideally controlled -Current treatment  . Donepezil 5mg dai88m -Medications previously tried: n/a  -Recommended to continue current medication   CKD (Goal: Prevention of disease progression) -Not ideally controlled -Counseled on avoidance of use of NSAIDs OTC  as they can be harmful to kidneys with current eGFR  Recommended continuing current medications Assessed doses of current medications, allpurinol has been tolerate at current dose, recommend to monitor for AE and reach out immediately with any issues or concerns       Patient verbalizes understanding of instructions provided today and agrees to view in [place].   Telephone follow up appointment with care management team member scheduled for: 3 weeks and 2 months   Tomasa Blase, PharmD Clinical Pharmacist, [place]

## 2020-07-14 NOTE — Progress Notes (Signed)
Subjective:    Patient ID: Erin Liu, female    DOB: 09/05/1940, 80 y.o.   MRN: [id number]  HPI The patient is here for an acute visit.   Her swelling is improving.  Her daughter has been elevating her legs.  She has not had any chest pain or SOB, but is sedentary.    She will be starting the PACE program with the state and it will start June 1.    Blood in underwear -  Her daughter saw a faint amount of blood in her underwear two days ago  and she saw it again on the wash cloth when she wiped her.  [place] said it was stinging down there.  She denied any urinary symptoms.  There has been no blood in the stool or black stool.  She has not seen any additional blood.    Medications and allergies reviewed with patient and updated if appropriate.  Patient Active Problem List   Diagnosis Date Noted  . Severe mitral regurgitation 07/02/2020  . Severe tricuspid regurgitation 07/02/2020  . Chronic systolic heart failure ([place]) 07/01/2020  . Recurrent UTI 07/01/2020  . Non-ST elevation (NSTEMI) myocardial infarction ([place])   . CAP (community acquired pneumonia) 05/15/2020  . Acute metabolic encephalopathy 73/41/9379  . Fall at home, initial encounter 05/15/2020  . Prolonged QT interval 05/15/2020  . Demand ischemia ([place])   . Recurrent falls 04/28/2020  . Weight loss, unintentional 09/11/2019  . Memory disorder 07/18/2019  . Physical deconditioning 01/13/2018  . Tremor of both hands 10/10/2017  . Urinary incontinence 10/10/2017  . Vasovagal syncope 10/02/2017  . Gout 07/12/2017  . B12 deficiency 04/11/2017  . Lumbago 02/25/2015  . Essential tremor   . Glaucoma 01/08/2010  . Chronic kidney disease (CKD), stage IV (severe) ([place]) 12/10/2008  . DM (diabetes mellitus), secondary, uncontrolled, with renal complications ([place]) 02/40/9735  . Diabetic neuropathy ([place]) 02/22/2006  . Dyslipidemia 02/22/2006  . 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 CARPAL TUNNEL SYNDROME, BILATERAL 02/22/2006     Current Outpatient Medications on File Prior to Visit  Medication Sig Dispense Refill  . acetaminophen (TYLENOL) 500 MG tablet Take 500 mg by mouth 2 (two) times daily.    Marland Kitchen allopurinol (ZYLOPRIM) 100 MG tablet TAKE 2 TABLETS BY MOUTH DAILY (Patient taking differently: Take 100 mg by mouth 2 (two) times daily.) 180 tablet 0  . aspirin EC 81 MG EC tablet Take 1 tablet (81 mg total) by mouth daily. Swallow whole. 30 tablet 11  . atorvastatin (LIPITOR) 40 MG tablet TAKE 1 TABLET BY MOUTH DAILY (Patient taking differently: Take 40 mg by mouth daily.) 90 tablet 1  . Cyanocobalamin (VITAMIN B-12) 2000 MCG TBCR Take 1 tablet (2,000 mcg total) by mouth daily. 90 tablet 1  . donepezil (ARICEPT) 5 MG tablet Take 5 mg by mouth at bedtime.    Marland Kitchen levothyroxine (SYNTHROID) 25 MCG tablet Take 1 tablet (25 mcg total) by mouth daily. 90 tablet 3  . torsemide 40 MG TABS Take 40 mg by mouth 2 (two) times daily. 60 tablet 0   No current facility-administered medications on file prior to visit.    Past Medical History:  Diagnosis Date  . ANEMIA, NORMOCYTIC   . Arthritis    back  . BREAST CYST, HX OF   . CARPAL TUNNEL SYNDROME, BILATERAL   . DIABETES MELLITUS, TYPE II   . Glaucoma   . HYPERLIPIDEMIA   . HYPERTENSION   . OBESITY   . PERIPHERAL NEUROPATHY   .  RENAL FAILURE, CHRONIC    Stage 4    Past Surgical History:  Procedure Laterality Date  . AV FISTULA PLACEMENT Left 05/23/2018   Procedure: ARTERIOVENOUS (AV) FISTULA CREATION LEFT ARM;  Surgeon: Waynetta Sandy, MD;  Location: [facility];  Service: Vascular;  Laterality: Left;  . BASCILIC VEIN TRANSPOSITION Left 08/03/2018   Procedure: SECOND STAGE BASILIC VEIN TRANSPOSITION LEFT ARM;  Surgeon: Waynetta Sandy, MD;  Location: [facility];  Service: Vascular;  Laterality: Left;  . CATARACT EXTRACTION BILATERAL W/ ANTERIOR VITRECTOMY  05/2010  . COLONOSCOPY     [facility]/7-8 years    Social History   Socioeconomic History  . Marital  status: Widowed    Spouse name: Not on file  . Number of children: 3  . Years of education: Not on file  . Highest education level: Not on file  Occupational History  . Not on file  Tobacco Use  . Smoking status: Never Smoker  . Smokeless tobacco: Never Used  Vaping Use  . Vaping Use: Never used  Substance and Sexual Activity  . Alcohol use: No  . Drug use: No  . Sexual activity: Not Currently  Other Topics Concern  . Not on file  Social History Narrative   Widowed, lives with son. Retired Medical illustrator   Social Determinants of Radio broadcast assistant Strain: [place]   . Difficulty of Paying Living Expenses: Not hard at all  Food Insecurity: No Food Insecurity  . Worried About Charity fundraiser in the Last Year: Never true  . Ran Out of Food in the Last Year: Never true  Transportation Needs: No Transportation Needs  . Lack of Transportation (Medical): No  . Lack of Transportation (Non-Medical): No  Physical Activity: Inactive  . Days of Exercise per Week: 0 days  . Minutes of Exercise per Session: 0 min  Stress: No Stress Concern Present  . Feeling of Stress : Not at all  Social Connections: Unknown  . Frequency of Communication with Friends and Family: More than three times a week  . Frequency of Social Gatherings with Friends and Family: Once a week  . Attends Religious Services: Patient refused  . Active Member of Clubs or Organizations: Patient refused  . Attends Archivist Meetings: Patient refused  . Marital Status: Widowed    Family History  Problem Relation Age of Onset  . Arthritis Son   . Diabetes Son   . Hypertension Son   . Colon cancer Son 44  . Neuropathy Son        chemotherapy related  . Heart attack Mother        Died age 3s  . Heart attack Father        No details 70s  . Heart disease Sister        Slight heart attack, died age 15    Review of Systems  Constitutional: Negative for appetite change, chills and fever.   Respiratory: Positive for cough. Negative for shortness of breath and wheezing.   Cardiovascular: Positive for leg swelling. Negative for chest pain and palpitations.  Gastrointestinal: Positive for constipation (at times). Negative for abdominal pain and diarrhea.  Genitourinary: Negative for dysuria.       Objective:   Vitals:   07/15/20 1450  BP: 114/72  Pulse: 87  Temp: 98.7 F (37.1 C)  SpO2: 96%   BP Readings from Last 3 Encounters:  07/15/20 114/72  07/02/20 126/72  06/20/20 120/87   Wt  Readings from Last 3 Encounters:  07/15/20 171 lb (77.6 kg)  07/02/20 173 lb (78.5 kg)  06/19/20 182 lb 1.6 oz (82.6 kg)   Body mass index is 28.46 kg/m.   Physical Exam Constitutional:      General: She is not in acute distress.    Appearance: Normal appearance. She is not ill-appearing.     Comments: Sleeping intermittently, awakes easily and goes back to sleep - this is not new  HENT:     Head: Normocephalic and atraumatic.  Cardiovascular:     Rate and Rhythm: Normal rate and regular rhythm.     Heart sounds: No murmur heard.   Pulmonary:     Effort: Pulmonary effort is normal. No respiratory distress.     Breath sounds: No wheezing or rales.  Abdominal:     General: There is no distension.     Palpations: Abdomen is soft.     Tenderness: There is no abdominal tenderness. There is no right CVA tenderness, left CVA tenderness, guarding or rebound.  Musculoskeletal:     Right lower leg: Edema (1+ pitting) present.     Left lower leg: Edema (1+ pitting) present.  Skin:    General: Skin is warm and dry.            Assessment & Plan:    See Problem List for Assessment and Plan of chronic medical problems.    This visit occurred during the SARS-CoV-2 public health emergency.  Safety protocols were in place, including screening questions prior to the visit, additional usage of staff PPE, and extensive cleaning of exam room while observing appropriate contact  time as indicated for disinfecting solutions.

## 2020-07-14 NOTE — Progress Notes (Addendum)
Chronic Care Management Pharmacy Note  07/14/2020 Name:  Erin Liu MRN:  [id number] DOB:  1940-07-18  Subjective: Erin Liu is an 80 y.o. year old female who is a primary patient of Burns, Claudina Lick, MD.  The CCM team was consulted for assistance with disease management and care coordination needs.    Engaged with patient by telephone for initial visit in response to provider referral for pharmacy case management and/or care coordination services.   Consent to Services:  The patient was given the following information about Chronic Care Management services today, agreed to services, and gave verbal consent: 1. CCM service includes personalized support from designated clinical staff supervised by the primary care provider, including individualized plan of care and coordination with other care providers 2. 24/7 contact phone numbers for assistance for urgent and routine care needs. 3. Service will only be billed when office clinical staff spend 20 minutes or more in a month to coordinate care. 4. Only one practitioner may furnish and bill the service in a calendar month. 5.The patient may stop CCM services at any time (effective at the end of the month) by phone call to the office staff. 6. The patient will be responsible for cost sharing (co-pay) of up to 20% of the service fee (after annual deductible is met). Patient agreed to services and consent obtained.  Patient Care Team: Binnie Rail, MD as PCP - General (Internal Medicine) Minus Breeding, MD as PCP - Cardiology (Cardiology) Hayden Pedro, MD (Ophthalmology) Milus Banister, MD (Gastroenterology) Elmarie Shiley, MD (Nephrology) Knox Royalty, RN as Case Manager Ashawnti Tangen, Darnelle Maffucci, [facility] as Pharmacist (Pharmacist) Tomasa Blase, [facility] as Pharmacist (Pharmacist)  Recent office visits: 07/02/2020 - Dr. Quay Burow  - referred to HF clinic, started synthroid 64mg daily  06/03/2020 - CCM RN telephone visit - reviewed HF diagnosis  and care  04/28/2020 - Dr. BQuay Burow- started Aricept, referred for PT   [facility] - ED visit for diarrhea and loss of appetite  05/14/2020 - 05/21/2020- ED visit and hospitalization - fall at home, CKD, HF  06/07/2020 - ED visit for altered mental status - possible UTI - treated with course of Keflex  06/19/2020 - ED visit for generalized weakness    Objective:  Lab Results  Component Value Date   CREATININE 3.62 (H) 07/02/2020   BUN 89 (HH) 07/02/2020   GFR 11.39 (LL) 07/02/2020   GFRNONAA 14 (L) 06/19/2020   GFRAA 15 (L) 01/11/2018   NA 129 (L) 07/02/2020   K 3.9 07/02/2020   CALCIUM 9.4 07/02/2020   CO2 24 07/02/2020   GLUCOSE 79 07/02/2020    Lab Results  Component Value Date/Time   HGBA1C 6.0 07/02/2020 11:47 AM   HGBA1C 5.4 01/21/2020 03:53 PM   HGBA1C 5.4 01/21/2020 03:53 PM   HGBA1C 5.4 (A) 01/21/2020 03:53 PM   HGBA1C 5.4 01/21/2020 03:53 PM   HGBA1C 6.2 07/18/2019 11:35 AM   GFR 11.39 (LL) 07/02/2020 11:47 AM   GFR 16.75 (L) 03/03/2020 02:30 PM   MICROALBUR 5.3 (H) 02/25/2015 11:18 AM   MICROALBUR 14.9 (H) 10/15/2013 09:11 AM    Last diabetic Eye exam:  Lab Results  Component Value Date/Time   HMDIABEYEEXA Retinopathy (A) 12/12/2017 12:00 AM    Last diabetic Foot exam:  Lab Results  Component Value Date/Time   HMDIABFOOTEX done 11/27/2018 12:00 AM     Lab Results  Component Value Date  CHOL 208 (H) 07/18/2019   HDL 70.90 07/18/2019   LDLCALC 122 (H) 07/18/2019   LDLDIRECT 230.5 09/11/2010   TRIG 76.0 07/18/2019   CHOLHDL 3 07/18/2019    Hepatic Function Latest Ref Rng & Units 07/02/2020 06/19/2020 05/16/2020  Total Protein 6.0 - 8.3 g/dL 6.5 6.2(L) 5.6(L)  Albumin 3.5 - 5.2 g/dL 3.2(L) 2.8(L) 3.0(L)  AST 0 - 37 U/L 124(H) 145(H) 109(H)  ALT 0 - 35 U/L 51(H) 49(H) 52(H)  Alk Phosphatase 39 - 117 U/L 101 99 97  Total Bilirubin 0.2 - 1.2 mg/dL 1.1 1.2 1.3(H)  Bilirubin, Direct 0.0 - 0.3 mg/dL - - -     Lab Results  Component Value Date/Time   TSH 6.83 (H) 07/02/2020 11:47 AM   TSH 6.107 (H) 06/20/2020 06:30 AM   TSH 4.586 (H) 05/15/2020 09:22 AM   TSH 2.39 07/18/2019 11:35 AM    CBC Latest Ref Rng & Units 07/02/2020 06/19/2020 06/08/2020  WBC 4.0 - 10.5 K/uL 4.6 4.4 4.5  Hemoglobin 12.0 - 15.0 g/dL 9.8(L) 9.9(L) 11.9(L)  Hematocrit 36.0 - 46.0 % 29.3(L) 30.1(L) 38.1  Platelets 150.0 - 400.0 K/uL 113.0(L) 98(L) 68(L)    Lab Results  Component Value Date/Time   VD25OH 9.90 (L) 01/18/2018 03:25 PM    Clinical ASCVD: Yes  The ASCVD Risk score Mikey Bussing DC Jr., et al., 2013) failed to calculate for the following reasons:   The 2013 ASCVD risk score is only valid for ages 12 to 19   The patient has a prior MI or stroke diagnosis    Depression screen [facility] 2/9 06/03/2020 07/18/2019 04/03/2018  Decreased Interest 0 0 0  Down, Depressed, Hopeless 0 0 1  PHQ - 2 Score 0 0 1     Social History   Tobacco Use  Smoking Status Never Smoker  Smokeless Tobacco Never Used   BP Readings from Last 3 Encounters:  07/02/20 126/72  06/20/20 120/87  06/08/20 116/73   Pulse Readings from Last 3 Encounters:  07/02/20 72  06/20/20 96  06/08/20 87   Wt Readings from Last 3 Encounters:  07/02/20 173 lb (78.5 kg)  06/19/20 182 lb 1.6 oz (82.6 kg)  05/21/20 182 lb 1.6 oz (82.6 kg)   BMI Readings from Last 3 Encounters:  07/02/20 28.79 kg/m  06/19/20 30.30 kg/m  05/21/20 30.30 kg/m    Assessment/Interventions: Review of patient past medical history, allergies, medications, health status, including review of consultants reports, laboratory and other test data, was performed as part of comprehensive evaluation and provision of chronic care management services.   SDOH:  (Social Determinants of Health) assessments and interventions performed: Yes  SDOH Screenings   Alcohol Screen: Low Risk   . Last Alcohol Screening Score (AUDIT): 0  Depression (PHQ2-9): Low Risk   . PHQ-2 Score: 0   Financial Resource Strain: Low Risk   . Difficulty of Paying Living Expenses: Not hard at all  Food Insecurity: No Food Insecurity  . Worried About Charity fundraiser in the Last Year: Never true  . Ran Out of Food in the Last Year: Never true  Housing: Low Risk   . Last Housing Risk Score: 0  Physical Activity: Inactive  . Days of Exercise per Week: 0 days  . Minutes of Exercise per Session: 0 min  Social Connections: Unknown  . Frequency of Communication with Friends and Family: More than three times a week  . Frequency of Social Gatherings with Friends and Family: Once a week  .  Attends Religious Services: Patient refused  . Active Member of Clubs or Organizations: Patient refused  . Attends Archivist Meetings: Patient refused  . Marital Status: Widowed  Stress: No Stress Concern Present  . Feeling of Stress : Not at all  Tobacco Use: Low Risk   . Smoking Tobacco Use: Never Smoker  . Smokeless Tobacco Use: Never Used  Transportation Needs: No Transportation Needs  . Lack of Transportation (Medical): No  . Lack of Transportation (Non-Medical): No    CCM Care Plan  Allergies  Allergen Reactions  . Lisinopril Cough    Medications Reviewed Today    Reviewed by Tomasa Blase, [facility] (Pharmacist) on 07/14/20 at [place] List Status: <None>  Medication Order Taking? Sig Documenting Provider Last Dose Status Informant  acetaminophen (TYLENOL) 500 MG tablet [id number] Yes Take 500 mg by mouth 2 (two) times daily. [provider] Taking Active Child  allopurinol (ZYLOPRIM) 100 MG tablet [id number] Yes TAKE 2 TABLETS BY MOUTH DAILY  Patient taking differently: Take 100 mg by mouth 2 (two) times daily.   Binnie Rail, MD Taking Active            Med Note [place] Crane, MISTY D   Fri Jun 20, 2020  4:35 AM)    aspirin EC 81 MG EC tablet [id number] Yes Take 1 tablet (81 mg total) by mouth daily. Swallow whole. Pokhrel, Laxman, MD Taking Active Child  atorvastatin  (LIPITOR) 40 MG tablet [id number] Yes TAKE 1 TABLET BY MOUTH DAILY  Patient taking differently: Take 40 mg by mouth daily.   Binnie Rail, MD Taking Active   Cyanocobalamin (VITAMIN B-12) 2000 MCG TBCR [id number] Yes Take 1 tablet (2,000 mcg total) by mouth daily. Binnie Rail, MD Taking Active Child  donepezil (ARICEPT) 5 MG tablet [id number] Yes Take 5 mg by mouth at bedtime. [provider] Taking Active   levothyroxine (SYNTHROID) 25 MCG tablet [id number] Yes Take 1 tablet (25 mcg total) by mouth daily. Binnie Rail, MD Taking Active   torsemide 40 MG TABS [id number]  Take 40 mg by mouth 2 (two) times daily. Flora Lipps, MD  Expired 06/20/20 2359 Child          Patient Active Problem List   Diagnosis Date Noted  . Severe mitral regurgitation 07/02/2020  . Severe tricuspid regurgitation 07/02/2020  . Chronic systolic heart failure ([place]) 07/01/2020  . Recurrent UTI 07/01/2020  . Non-ST elevation (NSTEMI) myocardial infarction ([place])   . CAP (community acquired pneumonia) 05/15/2020  . Acute metabolic encephalopathy 24/01/4642  . Fall at home, initial encounter 05/15/2020  . Prolonged QT interval 05/15/2020  . Demand ischemia ([place])   . Recurrent falls 04/28/2020  . Weight loss, unintentional 09/11/2019  . Memory disorder 07/18/2019  . Physical deconditioning 01/13/2018  . Tremor of both hands 10/10/2017  . Urinary incontinence 10/10/2017  . Vasovagal syncope 10/02/2017  . Gout 07/12/2017  . B12 deficiency 04/11/2017  . Lumbago 02/25/2015  . Essential tremor   . Glaucoma 01/08/2010  . Chronic kidney disease (CKD), stage IV (severe) ([place]) 12/10/2008  . DM (diabetes mellitus), secondary, uncontrolled, with renal complications ([place]) 14/27/6701  . Diabetic neuropathy ([place]) 02/22/2006  . Dyslipidemia 02/22/2006  . 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 CARPAL TUNNEL SYNDROME, BILATERAL 02/22/2006    Immunization History  Administered Date(s) Administered  . Fluad  Quad(high Dose 65+) 01/21/2020  . Influenza Split 01/12/2011  . Influenza Whole 01/17/2007, 12/10/2008, 01/08/2010  . Influenza, High Dose Seasonal PF  12/11/2016, 12/19/2017  . Influenza,inj,Quad PF,6+ Mos 11/10/2012  . Influenza-Unspecified 12/07/2011, 12/22/2013, 01/08/2015  . PFIZER(Purple Top)SARS-COV-2 Vaccination 05/31/2019, 06/25/2019  . Pneumococcal Conjugate-13 09/25/2013  . Pneumococcal Polysaccharide-23 09/11/2010  . Tdap 09/11/2010    Conditions to be addressed/monitored:  Hyperlipidemia, Diabetes, Heart Failure, Chronic Kidney Disease, Hypothyroidism, Gout, and B-12 Anemia   Care Plan : Heart Failure (Adult)  Updates made by Tomasa Blase, RPH since 07/14/2020 12:00 AM    Problem: Symptom Exacerbation (Heart Failure)   Priority: Medium    Long-Range Goal: Symptom Exacerbation Prevented or Minimized   Start Date: 06/03/2020  Expected End Date: 09/03/2020  Recent Progress: On track  Priority: Medium  Note:   Current Barriers:  Marland Kitchen Knowledge deficit related to basic heart failure pathophysiology and self care management . Cognitive Deficits- memory loss . Unable to independently perform ADLs/ iADL's independently: strong supportive family assists with all care needs . Unable to self administer medications as prescribed- family provides medications and supervises medication administration . Weakness post- recent hospitalization Case Manager Clinical Goal(s):  Over the next 90 days, patient's caregiver will: Marland Kitchen Verbalize accurate signs/ symptoms of heart failure exacerbation in setting of being unable to weigh patient daily due to fall risk . Verbalize understanding of Heart Failure Action Plan and when to call doctor Interventions:  . Collaboration with Binnie Rail, MD regarding development and update of comprehensive plan of care as evidenced by provider attestation and co-signature . Inter-disciplinary care team collaboration (see longitudinal plan of care) . Basic  overview and discussion of pathophysiology of Heart Failure reviewed with caregiver: caregiver has good general understanding of same and could benefit from ongoing reinforcement of same . Provided verbal education on low sodium diet, need to balance fluid intake with prevention of dehydration in setting of CHF and CKD . Reviewed role of diuretics in prevention of fluid overload and management of heart failure . Confirmed with caregiver that patient is active with [facility]iative Care team, reviewed yesterday's palliative care home visit with caregiver: caregiver /daughter works with [facility] team as Ecologist . Medication review completed: no concerns identified . Reviewed recent hospitalization and PCP virtual office visit with caregiver: confirmed no questions, caregiver has good general understanding of post-hospital discharge plan of care . Assessed caregiver's ability to assist patient in obtaining daily weights- discussed need to ensure patient safety/ fall prevention: cautioned caregiver to not attempt daily weights if she is concerned for patient/ personal safety . Reviewed basic weight gain guidelines in setting of CHF along with corresponding action plan, should caregiver be able to begin monitoring and recording weights at home . Confirmed patient follows low-salt diet Self-Care Activities: . Patient verbalizes understanding of plan to continue actively participating with home health services, community palliative care team, with PACE program participation currently pending . Self administers medications as prescribed . Attends all scheduled provider appointments . Calls pharmacy for medication refills . Calls provider office for new concerns or questions Patient Goals: . Develop and follow rescue plan . Follow rescue plan if symptoms flare-up . Know when to call the doctor . Track symptoms and what helps feel better or  worse . Dress right for the weather, hot or cold  . Follow activity or exercise plan- continue working with home health PT and OT . Pace activity allowing for rest . Continue using salt in moderation . Watch for swelling in feet, ankles and legs every day; if you develop a new cough of  shortness of breath, call your doctor right away Follow Up Plan:  . Telephone follow up appointment with care management team member scheduled for: Aug 01, 2020 and September 09, 2020 . The patient has been provided with contact information for the care management team and has been advised to call with any health related questions or concerns.     Care Plan : [place]  Updates made by Tomasa Blase, RPH since 07/14/2020 12:00 AM    Problem: CHF, Hyperlipidemia, DM, CKD, Dementia, B-12 anemia, and Gout   Priority: High  Onset Date: 07/14/2020    Long-Range Goal: Disease Management   Start Date: 07/14/2020  Expected End Date: 09/13/2020  This Visit's Progress: On track  Priority: High  Note:   Hyperlipidemia: (LDL goal < 70) -Uncontrolled -Current treatment: . Atorvastatin 109m daily  . ASA 811mdaily  -Medications previously tried: n/a  -Current dietary patterns: chicken, tu[place]EVOO if cooking, omega 3 butter, avoidance of fried foods/ pork  -Educated on Cholesterol goals;  Benefits of statin for ASCVD risk reduction; Importance of limiting foods high in cholesterol; -Recommended to continue current medication Counseled on importance of diet in prevention of cholesterol accumulation   Diabetes (A1c goal <7%) -Controlled -Current medications: . N/a - DM is diet controlled  -Medications previously tried: n/a  - A1c well controlled and at goal - 6.0% -Educated on A1c and blood sugar goals; Carbohydrate counting and/or plate method -Counseled to check feet daily and get yearly eye exams -Recommended continuing current dietary restrictions and meal plan  Heart Failure (Goal: manage  symptoms and prevent exacerbations) -Uncontrolled -Last ejection fraction: 20-25% (Date: 05/16/2020) -HF type: Systolic -Current treatment: . Torsemide 4056mID  -Medications previously tried: lisinopril - cough  -Current home BP/HR readings: not currently checking BP - previously was being monitored by PT/OT when they were coming once a week  -Current dietary habits: low sodium diet, typically drinking water and cranberry juice  -Current exercise habits: limited, patient needs assistance for any ambulation  -Educated on Benefits of medications for managing symptoms and prolonging life Proper diuretic administration and potassium supplementation Importance of blood pressure control -Counseled on importance of avoidance of salt in diet to prevent fluid build up - Patient to establish with HF clinic as discussed with last PCP visit   Gout (Goal: Prevention of Acute Attack) -Controlled -Current treatment  . Allopurinol 100m65mice daily   . APAP 500mg70m  -Medications previously tried: n/a  -Recommended to continue current medication Counseled on avoidance of foods that can lead to gout flare up   Patient Goals/Self-Care Activities . Patient will:  - take medications as prescribed   Anemia (Vit B) (Goal: continue current control) -Controlled -Current treatment  . Vit B-12 - 2000mcg34mly  -Medications previously tried: n/a  -Recommended to continue current medication   Hypothyroidism (Goal: Establish Control, prevent complications ) -Uncontrolled - medication started with last PCP appointment  -Current treatment  . Levothyroxine 25mcg 87my  -Medications previously tried: n/a  -Recommended to continue current medication   Dementia (Goal: Prevention of Disease progression) -Not ideally controlled -Current treatment  . Donepezil 5mg dai36m -Medications previously tried: n/a  -Recommended to continue current medication   CKD (Goal: Prevention of disease  progression) -Not ideally controlled -Counseled on avoidance of use of NSAIDs OTC as they can be harmful to kidneys with current eGFR  Recommended continuing current medications Assessed doses of current medications, allpurinol has been tolerate at current dose, recommend  to monitor for AE and reach out immediately with any issues or concerns        Medication Assistance: None required.  Patient affirms current coverage meets needs.  Patient's preferred pharmacy is:  [facility], [address] [place] [address] Phone: [phone] Fax: [phone]  [id number] - [place] [address] [place] [address] Phone: [phone] Fax: [phone]   Uses pill box? Yes Pt endorses 100% compliance  Care Plan and Follow Up Patient Decision:  Patient agrees to Care Plan and Follow-up.  Plan: Telephone follow up appointment with care management team member scheduled for:  2 months from now  and The patient has been provided with contact information for the care management team and has been advised to call with any health related questions or concerns.   Patient plans to schedule PCP follow up from concern about possible UTI, also plans to establish with CHF clinic    Current Barriers:  . Unable to independently monitor therapeutic efficacy  Pharmacist Clinical Goal(s):  Marland Kitchen Patient will verbalize ability to afford treatment regimen . achieve adherence to monitoring guidelines and medication adherence to achieve therapeutic efficacy . adhere to prescribed medication regimen as evidenced by contiued use of medication weekly pill box  through collaboration with PharmD and provider.   Interventions: . 1:1 collaboration with Binnie Rail, MD regarding development and update of comprehensive plan of care as evidenced by provider attestation and  co-signature . Inter-disciplinary care team collaboration (see longitudinal plan of care) . Comprehensive medication review performed; medication list updated in electronic medical record    Patient Goals/Self-Care Activities . Patient will:  - take medications as prescribed   Anemia (Vit B) (Goal: continue current control) -Controlled -Current treatment  . Vit B-12 - 2039mg daily  -Medications previously tried: n/a  -Recommended to continue current medication   Hypothyroidism (Goal: Establish Control, prevent complications ) -Uncontrolled - medication started with last PCP appointment  -Current treatment  . Levothyroxine 226m daily  -Medications previously tried: n/a  -Recommended to continue current medication   Dementia (Goal: Prevention of Disease progression) -Not ideally controlled -Current treatment  . Donepezil 6m79maily  -Medications previously tried: n/a  -Recommended to continue current medication   CKD (Goal: Prevention of disease progression) -Not ideally controlled -Counseled on avoidance of use of NSAIDs OTC as they can be harmful to kidneys with current eGFR  Recommended continuing current medications Assessed doses of current medications, allpurinol has been tolerated at current dose, recommend to monitor for AE and reach out immediately with any issues or concerns, otherwise will plan to continue at this time    Follow Up Plan: Telephone follow up appointment with care management team member scheduled for:3 weeks   DanTomasa BlaseharmD Clinical Pharmacist, [place]

## 2020-07-15 ENCOUNTER — Encounter: Payer: Self-pay | Admitting: Internal Medicine

## 2020-07-15 ENCOUNTER — Ambulatory Visit (INDEPENDENT_AMBULATORY_CARE_PROVIDER_SITE_OTHER): Payer: Medicare HMO | Admitting: Internal Medicine

## 2020-07-15 VITALS — BP 114/72 | HR 87 | Temp 98.7°F | Ht 65.0 in | Wt 171.0 lb

## 2020-07-15 DIAGNOSIS — I5022 Chronic systolic (congestive) heart failure: Secondary | ICD-10-CM | POA: Diagnosis not present

## 2020-07-15 DIAGNOSIS — R310 Gross hematuria: Secondary | ICD-10-CM | POA: Diagnosis not present

## 2020-07-15 NOTE — Patient Instructions (Addendum)
Lets try to get a urine sample and make sure she does not have an infection.

## 2020-07-15 NOTE — Assessment & Plan Note (Signed)
Acute Her daughter saw blood in her underwear and when she wiped two days ago No urine symptoms or  Symptoms of infection No blood in stool or with wiping rectal area ? uti Will order UA, UCx to evaluate for blood and infection She will monitor for additional blood and let me know if there are more episodes

## 2020-07-15 NOTE — Assessment & Plan Note (Signed)
Chronic Swelling improved compared to last visit after increased elevating legs Still with b/l swelling - no SOB but very sedentary Continue torsemide 40 mg BID Will go go HF clinic to see if they have any other recommendations  I discussed with her daughter we are limited due to her BP and CKD Continue elevating legs, low sodium diet

## 2020-07-16 NOTE — Progress Notes (Signed)
Sounds great thank you! Just difficult to tell really where to go because of only a limited number of BP and pulse readings and she has that previous fall history, definitely want to be cautious   Thanks again!  Linna Hoff

## 2020-07-17 DIAGNOSIS — R310 Gross hematuria: Secondary | ICD-10-CM | POA: Diagnosis not present

## 2020-07-17 LAB — URINALYSIS, ROUTINE W REFLEX MICROSCOPIC
Bilirubin Urine: NEGATIVE
Ketones, ur: NEGATIVE
Nitrite: NEGATIVE
Specific Gravity, Urine: 1.015 (ref 1.000–1.030)
Urine Glucose: NEGATIVE
Urobilinogen, UA: 0.2 (ref 0.0–1.0)
pH: 5 (ref 5.0–8.0)

## 2020-07-17 NOTE — Addendum Note (Signed)
Addended by: Jacobo Forest on: 07/17/2020 09:40 AM   Modules accepted: Orders

## 2020-07-19 LAB — URINE CULTURE

## 2020-07-20 ENCOUNTER — Encounter: Payer: Self-pay | Admitting: Internal Medicine

## 2020-07-20 MED ORDER — FOSFOMYCIN TROMETHAMINE 3 G PO PACK: 3.0000 g | PACK | Freq: Once | ORAL | 0 refills | Status: AC

## 2020-07-20 NOTE — Addendum Note (Signed)
Addended by: Binnie Rail on: 07/20/2020 11:31 AM   Modules accepted: Orders

## 2020-07-21 ENCOUNTER — Other Ambulatory Visit: Payer: Self-pay

## 2020-07-21 ENCOUNTER — Telehealth: Payer: Self-pay

## 2020-07-21 ENCOUNTER — Other Ambulatory Visit: Payer: Medicare HMO | Admitting: Hospice

## 2020-07-21 DIAGNOSIS — R531 Weakness: Secondary | ICD-10-CM

## 2020-07-21 DIAGNOSIS — N3001 Acute cystitis with hematuria: Secondary | ICD-10-CM | POA: Diagnosis not present

## 2020-07-21 DIAGNOSIS — Z515 Encounter for palliative care: Secondary | ICD-10-CM

## 2020-07-21 DIAGNOSIS — F0390 Unspecified dementia without behavioral disturbance: Secondary | ICD-10-CM | POA: Diagnosis not present

## 2020-07-21 MED ORDER — TORSEMIDE 40 MG PO TABS: 40.0000 mg | ORAL_TABLET | Freq: Two times a day (BID) | ORAL | 5 refills | Status: DC

## 2020-07-21 MED ORDER — FOSFOMYCIN TROMETHAMINE 3 G PO PACK: 3.0000 g | PACK | Freq: Once | ORAL | 0 refills | Status: AC

## 2020-07-21 NOTE — Progress Notes (Signed)
[facility] Consult Note Telephone: [phone]  Fax: [phone]  PATIENT NAME: Erin Liu DOB: 05-24-1940 MRN: [id number]  PRIMARY CARE PROVIDER:   Binnie Rail, MD Erin Rail, MD [place],  [place] [address]  REFERRING PROVIDER: Binnie Rail, MD Erin Rail, MD [place],  [place] [address]   RESPONSIBLE PARTY:  [name] Emergency contcat: Erin Liu [address] [phone] h   Visit is to build trust and highlight Palliative Medicine as specialized medical care for people living with serious illness, aimed at facilitating better quality of life through symptoms relief, assisting with advance care plan and establishing goals of care. Erin Liu and her brother Erin Liu we are present with patient during visit.  RECOMMENDATIONS/PLAN:   Advance Care Planning/CODE STATUS: Patient is a Full code.   GOALS OF CARE: Goals of care include to maximize quality of life and symptom management.  Patient/family's christian faith keep them going through challenges.  Symptom Management/Plan:  Weakness: Continue PT/OT completed. Patient can now sit up in her wheelchair; able to stand with assistance.  Balance of rest and performance activity.  Encourage adequate oral intake. Appetite is improving. Continue to offer 4-6 small meals daily incorporating fruits and vegetables. Offer her choice foods/soups; avoid added salt.  Gait disturbance: Falls precautions.  Dementia: FAST 6C, FLACC 0.  Continue Aricept.  Encourage reminiscence/recalls; encourage puzzles word search.,  Approach and redirection as needed. Patient is followed by Nephrologist for stage IV CKD- Dr Posey Pronto, next visit in May 2022.  Recurring UTI: Patient currently on fosfomycin; denies urinary symptoms during visit [place] reports patient will commence [facility] services 08/06/2020 Palliative will continue to monitor for symptom  management/decline and make recommendations as needed. Return6weeks or prn. Encouraged to call provider sooner with any concerns.   PPS: weak 40%, and improvement from 30% last month.  Family /Caregiver/Community Supports: Patient lives at home with her son. Erin Liu is quite involved in her care.  Strong family support system identified.   Chief comlpain: Palliative follow up visit/weakness   HISTORY OF PRESENT ILLNESS:  Erin Liu is a 80 y.o. female with multiple medical problems including generalized body weakness, CKD stage IV, Type 2 DM, HTN , Dementia, recurring UTI. Erin Liu reported dialysis not recommended by her nephrologist because of patient's advanced age and comorbidities. History obtained from review of EMR, discussion with patient/family.   Review and summarization of Epic records shows history from other than patient.   Palliative Care was asked to follow this patient by consultation request of Burns, Claudina Lick, MD to help address complex decision making in the context of advance care planning and goals of care clarification.    ROS  General: NAD EYES: No vision changes,  ENMT: No dysphagia no xerostomia Cardiovascular: No chest pain Pulmonary: No cough, SOB  Abdomen:no constipation or diarrhea GU: No dysuria or urinary frequency MSK:  ROM limitations Skin: No rashes or wounds Neurological: weakness Psych:  positive mood Heme/lymph/immuno: No bruises or abnormal bleeding  PHYSICAL EXAM  Height:  5 feet 5 inches     Weight: 175Ibs Constitutional: In no acute distress, well developed and well nourished Cardiovascular: regular rate and rhythm; no edema in BLE Pulmonary: no cough, no increased work of breathing, normal respiratory effort Abdomen: soft, non tender, positive bowel sounds in all quadrants GU:  no suprapubic tenderness Eyes: Normal lids, no discharge, sclera anicteric ENMT: Moist mucous membranes Musculoskeletal:  weakness, non ambulatory Skin:  no rash to visible skin, dry skin,warm without cyanosis; provolone boots on bilateral feet Psych: non-anxious affect Neurological: Weakness but otherwise non focal; sleepy most of the visit Heme/lymph/immuno: no bruises, no bleeding  PERTINENT MEDICATIONS:  Outpatient Encounter Medications as of 07/21/2020  Medication Sig  . acetaminophen (TYLENOL) 500 MG tablet Take 500 mg by mouth 2 (two) times daily.  Marland Kitchen allopurinol (ZYLOPRIM) 100 MG tablet TAKE 2 TABLETS BY MOUTH DAILY (Patient taking differently: Take 100 mg by mouth 2 (two) times daily.)  . aspirin EC 81 MG EC tablet Take 1 tablet (81 mg total) by mouth daily. Swallow whole.  Marland Kitchen atorvastatin (LIPITOR) 40 MG tablet TAKE 1 TABLET BY MOUTH DAILY (Patient taking differently: Take 40 mg by mouth daily.)  . Cyanocobalamin (VITAMIN B-12) 2000 MCG TBCR Take 1 tablet (2,000 mcg total) by mouth daily.  Marland Kitchen donepezil (ARICEPT) 5 MG tablet Take 5 mg by mouth at bedtime.  . fosfomycin (MONUROL) 3 g PACK Take 3 g by mouth once for 1 dose.  . levothyroxine (SYNTHROID) 25 MCG tablet Take 1 tablet (25 mcg total) by mouth daily.  . Torsemide 40 MG TABS Take 40 mg by mouth 2 (two) times daily.   No facility-administered encounter medications on file as of 07/21/2020.    HOSPICE ELIGIBILITY/DIAGNOSIS: TBD  PAST MEDICAL HISTORY:  Past Medical History:  Diagnosis Date  . ANEMIA, NORMOCYTIC   . Arthritis    back  . BREAST CYST, HX OF   . CARPAL TUNNEL SYNDROME, BILATERAL   . DIABETES MELLITUS, TYPE II   . Glaucoma   . HYPERLIPIDEMIA   . HYPERTENSION   . OBESITY   . PERIPHERAL NEUROPATHY   . RENAL FAILURE, CHRONIC    Stage 4     SOCIAL HX: @SOCX  Patient lives at home for ongoing care  FAMILY HX:  Family History  Problem Relation Age of Onset  . Arthritis Son   . Diabetes Son   . Hypertension Son   . Colon cancer Son 75  . Neuropathy Son        chemotherapy related  . Heart attack Mother        Died age 40s  . Heart attack Father         No details 39s  . Heart disease Sister        Slight heart attack, died age 34   ALLERGIES:  Allergies  Allergen Reactions  . Lisinopril Cough      I spent 50 minutes providing this consultation; this includes time spent with patient/family, chart review and documentation. More than 50% of the time in this consultation was spent on counseling and coordinating communication   Thank you for the opportunity to participate in the care of RHEA KAELIN Please call our office at [phone] if we can be of additional assistance.  Note: Portions of this note were generated with Lobbyist. Dictation errors may occur despite best attempts at proofreading.  Teodoro Spray, NP

## 2020-07-21 NOTE — Telephone Encounter (Signed)
sent 

## 2020-07-22 ENCOUNTER — Ambulatory Visit: Payer: Medicare HMO

## 2020-07-22 DIAGNOSIS — N184 Chronic kidney disease, stage 4 (severe): Secondary | ICD-10-CM | POA: Diagnosis not present

## 2020-07-22 DIAGNOSIS — R5381 Other malaise: Secondary | ICD-10-CM | POA: Diagnosis not present

## 2020-07-22 DIAGNOSIS — I214 Non-ST elevation (NSTEMI) myocardial infarction: Secondary | ICD-10-CM | POA: Diagnosis not present

## 2020-07-22 DIAGNOSIS — J189 Pneumonia, unspecified organism: Secondary | ICD-10-CM | POA: Diagnosis not present

## 2020-07-23 ENCOUNTER — Telehealth (HOSPITAL_COMMUNITY): Payer: Self-pay | Admitting: Cardiology

## 2020-07-23 NOTE — Telephone Encounter (Signed)
Pt called to f/u w/referral, please advise 

## 2020-07-25 ENCOUNTER — Other Ambulatory Visit: Payer: Self-pay

## 2020-07-25 ENCOUNTER — Inpatient Hospital Stay (HOSPITAL_COMMUNITY)
Admission: EM | Admit: 2020-07-25 | Discharge: 2020-07-28 | DRG: 070 | Disposition: A | Discharge status: Hospice | Payer: Medicare HMO | Attending: Internal Medicine | Admitting: Internal Medicine

## 2020-07-25 ENCOUNTER — Encounter (HOSPITAL_COMMUNITY): Payer: Self-pay

## 2020-07-25 ENCOUNTER — Emergency Department (HOSPITAL_COMMUNITY): Payer: Medicare HMO

## 2020-07-25 DIAGNOSIS — E785 Hyperlipidemia, unspecified: Secondary | ICD-10-CM | POA: Diagnosis present

## 2020-07-25 DIAGNOSIS — N289 Disorder of kidney and ureter, unspecified: Secondary | ICD-10-CM

## 2020-07-25 DIAGNOSIS — E871 Hypo-osmolality and hyponatremia: Secondary | ICD-10-CM | POA: Diagnosis not present

## 2020-07-25 DIAGNOSIS — Z20822 Contact with and (suspected) exposure to covid-19: Secondary | ICD-10-CM | POA: Diagnosis present

## 2020-07-25 DIAGNOSIS — I2510 Atherosclerotic heart disease of native coronary artery without angina pectoris: Secondary | ICD-10-CM | POA: Diagnosis present

## 2020-07-25 DIAGNOSIS — Z833 Family history of diabetes mellitus: Secondary | ICD-10-CM

## 2020-07-25 DIAGNOSIS — L8990 Pressure ulcer of unspecified site, unspecified stage: Secondary | ICD-10-CM | POA: Diagnosis present

## 2020-07-25 DIAGNOSIS — Z888 Allergy status to other drugs, medicaments and biological substances status: Secondary | ICD-10-CM

## 2020-07-25 DIAGNOSIS — Z515 Encounter for palliative care: Secondary | ICD-10-CM

## 2020-07-25 DIAGNOSIS — I429 Cardiomyopathy, unspecified: Secondary | ICD-10-CM | POA: Diagnosis present

## 2020-07-25 DIAGNOSIS — H409 Unspecified glaucoma: Secondary | ICD-10-CM | POA: Diagnosis present

## 2020-07-25 DIAGNOSIS — Z800 Family history of malignant neoplasm of digestive organs: Secondary | ICD-10-CM | POA: Diagnosis not present

## 2020-07-25 DIAGNOSIS — L89512 Pressure ulcer of right ankle, stage 2: Secondary | ICD-10-CM | POA: Diagnosis present

## 2020-07-25 DIAGNOSIS — F0390 Unspecified dementia without behavioral disturbance: Secondary | ICD-10-CM | POA: Diagnosis present

## 2020-07-25 DIAGNOSIS — R531 Weakness: Secondary | ICD-10-CM | POA: Diagnosis not present

## 2020-07-25 DIAGNOSIS — E1122 Type 2 diabetes mellitus with diabetic chronic kidney disease: Secondary | ICD-10-CM | POA: Diagnosis present

## 2020-07-25 DIAGNOSIS — G9340 Encephalopathy, unspecified: Secondary | ICD-10-CM | POA: Diagnosis not present

## 2020-07-25 DIAGNOSIS — Z9841 Cataract extraction status, right eye: Secondary | ICD-10-CM | POA: Diagnosis not present

## 2020-07-25 DIAGNOSIS — N185 Chronic kidney disease, stage 5: Secondary | ICD-10-CM | POA: Diagnosis present

## 2020-07-25 DIAGNOSIS — Z79899 Other long term (current) drug therapy: Secondary | ICD-10-CM | POA: Diagnosis not present

## 2020-07-25 DIAGNOSIS — Z8249 Family history of ischemic heart disease and other diseases of the circulatory system: Secondary | ICD-10-CM

## 2020-07-25 DIAGNOSIS — D539 Nutritional anemia, unspecified: Secondary | ICD-10-CM | POA: Diagnosis present

## 2020-07-25 DIAGNOSIS — R570 Cardiogenic shock: Secondary | ICD-10-CM | POA: Diagnosis present

## 2020-07-25 DIAGNOSIS — I959 Hypotension, unspecified: Secondary | ICD-10-CM | POA: Diagnosis not present

## 2020-07-25 DIAGNOSIS — E1142 Type 2 diabetes mellitus with diabetic polyneuropathy: Secondary | ICD-10-CM | POA: Diagnosis present

## 2020-07-25 DIAGNOSIS — Z66 Do not resuscitate: Secondary | ICD-10-CM | POA: Diagnosis not present

## 2020-07-25 DIAGNOSIS — I5022 Chronic systolic (congestive) heart failure: Secondary | ICD-10-CM | POA: Diagnosis present

## 2020-07-25 DIAGNOSIS — I10 Essential (primary) hypertension: Secondary | ICD-10-CM | POA: Diagnosis not present

## 2020-07-25 DIAGNOSIS — G9341 Metabolic encephalopathy: Secondary | ICD-10-CM | POA: Diagnosis not present

## 2020-07-25 DIAGNOSIS — F419 Anxiety disorder, unspecified: Secondary | ICD-10-CM | POA: Diagnosis present

## 2020-07-25 DIAGNOSIS — Z9842 Cataract extraction status, left eye: Secondary | ICD-10-CM | POA: Diagnosis not present

## 2020-07-25 DIAGNOSIS — N179 Acute kidney failure, unspecified: Secondary | ICD-10-CM | POA: Diagnosis not present

## 2020-07-25 DIAGNOSIS — I252 Old myocardial infarction: Secondary | ICD-10-CM

## 2020-07-25 DIAGNOSIS — Z7982 Long term (current) use of aspirin: Secondary | ICD-10-CM

## 2020-07-25 DIAGNOSIS — D631 Anemia in chronic kidney disease: Secondary | ICD-10-CM | POA: Diagnosis present

## 2020-07-25 DIAGNOSIS — L89522 Pressure ulcer of left ankle, stage 2: Secondary | ICD-10-CM | POA: Diagnosis present

## 2020-07-25 DIAGNOSIS — I447 Left bundle-branch block, unspecified: Secondary | ICD-10-CM | POA: Diagnosis present

## 2020-07-25 DIAGNOSIS — J9811 Atelectasis: Secondary | ICD-10-CM | POA: Diagnosis not present

## 2020-07-25 DIAGNOSIS — Z7984 Long term (current) use of oral hypoglycemic drugs: Secondary | ICD-10-CM | POA: Diagnosis not present

## 2020-07-25 DIAGNOSIS — R34 Anuria and oliguria: Secondary | ICD-10-CM | POA: Diagnosis present

## 2020-07-25 DIAGNOSIS — D696 Thrombocytopenia, unspecified: Secondary | ICD-10-CM | POA: Diagnosis present

## 2020-07-25 DIAGNOSIS — I132 Hypertensive heart and chronic kidney disease with heart failure and with stage 5 chronic kidney disease, or end stage renal disease: Secondary | ICD-10-CM | POA: Diagnosis present

## 2020-07-25 DIAGNOSIS — R4182 Altered mental status, unspecified: Secondary | ICD-10-CM | POA: Diagnosis not present

## 2020-07-25 LAB — CBC WITH DIFFERENTIAL/PLATELET
Abs Immature Granulocytes: 0.04 10*3/uL (ref 0.00–0.07)
Basophils Absolute: 0 10*3/uL (ref 0.0–0.1)
Basophils Relative: 0 %
Eosinophils Absolute: 0.1 10*3/uL (ref 0.0–0.5)
Eosinophils Relative: 1 %
HCT: 28.2 % — ABNORMAL LOW (ref 36.0–46.0)
Hemoglobin: 9.3 g/dL — ABNORMAL LOW (ref 12.0–15.0)
Immature Granulocytes: 1 %
Lymphocytes Relative: 27 %
Lymphs Abs: 1.3 10*3/uL (ref 0.7–4.0)
MCH: 33.1 pg (ref 26.0–34.0)
MCHC: 33 g/dL (ref 30.0–36.0)
MCV: 100.4 fL — ABNORMAL HIGH (ref 80.0–100.0)
Monocytes Absolute: 0.4 10*3/uL (ref 0.1–1.0)
Monocytes Relative: 8 %
Neutro Abs: 3 10*3/uL (ref 1.7–7.7)
Neutrophils Relative %: 63 %
Platelets: 81 10*3/uL — ABNORMAL LOW (ref 150–400)
RBC: 2.81 MIL/uL — ABNORMAL LOW (ref 3.87–5.11)
RDW: 20 % — ABNORMAL HIGH (ref 11.5–15.5)
WBC: 4.8 10*3/uL (ref 4.0–10.5)
nRBC: 3.6 % — ABNORMAL HIGH (ref 0.0–0.2)

## 2020-07-25 LAB — COMPREHENSIVE METABOLIC PANEL
ALT: 41 U/L (ref 0–44)
AST: 86 U/L — ABNORMAL HIGH (ref 15–41)
Albumin: 3.1 g/dL — ABNORMAL LOW (ref 3.5–5.0)
Alkaline Phosphatase: 95 U/L (ref 38–126)
Anion gap: 14 (ref 5–15)
BUN: 79 mg/dL — ABNORMAL HIGH (ref 8–23)
CO2: 21 mmol/L — ABNORMAL LOW (ref 22–32)
Calcium: 9.6 mg/dL (ref 8.9–10.3)
Chloride: 90 mmol/L — ABNORMAL LOW (ref 98–111)
Creatinine, Ser: 4.25 mg/dL — ABNORMAL HIGH (ref 0.44–1.00)
GFR, Estimated: 10 mL/min — ABNORMAL LOW (ref >60)
Glucose, Bld: 122 mg/dL — ABNORMAL HIGH (ref 70–99)
Potassium: 4 mmol/L (ref 3.5–5.1)
Sodium: 125 mmol/L — ABNORMAL LOW (ref 135–145)
Total Bilirubin: 1.5 mg/dL — ABNORMAL HIGH (ref 0.3–1.2)
Total Protein: 6.3 g/dL — ABNORMAL LOW (ref 6.5–8.1)

## 2020-07-25 LAB — CBG MONITORING, ED: Glucose-Capillary: 132 mg/dL — ABNORMAL HIGH (ref 70–99)

## 2020-07-25 MED ORDER — LACTATED RINGERS IV BOLUS
500.0000 mL | Freq: Once | INTRAVENOUS | Status: AC
  Administered 2020-07-25: 500 mL via INTRAVENOUS

## 2020-07-25 NOTE — ED Triage Notes (Signed)
Patient arrives with daughter for altered mental status and syncope, states she was eating around 1730 and became more altered

## 2020-07-25 NOTE — ED Provider Notes (Signed)
[facility] EMERGENCY DEPARTMENT Provider Note   CSN: [id number] Arrival date & time: 07/25/20  1922     History Chief Complaint  Patient presents with  . Altered Mental Status    Erin Liu is a 80 y.o. female.   Altered Mental Status Presenting symptoms: behavior changes   Severity:  Mild Most recent episode:  Today Duration:  1 day Timing:  Constant Progression:  Worsening Chronicity:  Recurrent Context: recent illness   Context: not alcohol use, not dementia, not head injury and taking medications as prescribed   Associated symptoms: weakness   Associated symptoms: no abdominal pain, no headaches and no rash   Weakness Associated symptoms: no abdominal pain and no headaches        Past Medical History:  Diagnosis Date  . ANEMIA, NORMOCYTIC   . Arthritis    back  . BREAST CYST, HX OF   . CARPAL TUNNEL SYNDROME, BILATERAL   . DIABETES MELLITUS, TYPE II   . Glaucoma   . HYPERLIPIDEMIA   . HYPERTENSION   . OBESITY   . PERIPHERAL NEUROPATHY   . RENAL FAILURE, CHRONIC    Stage 4    Patient Active Problem List   Diagnosis Date Noted  . Gross hematuria 07/15/2020  . Severe mitral regurgitation 07/02/2020  . Severe tricuspid regurgitation 07/02/2020  . Chronic systolic heart failure ([place]) 07/01/2020  . Recurrent UTI 07/01/2020  . Non-ST elevation (NSTEMI) myocardial infarction ([place])   . CAP (community acquired pneumonia) 05/15/2020  . Acute metabolic encephalopathy 59/74/1638  . Fall at home, initial encounter 05/15/2020  . Prolonged QT interval 05/15/2020  . Demand ischemia ([place])   . Recurrent falls 04/28/2020  . Weight loss, unintentional 09/11/2019  . Memory disorder 07/18/2019  . Physical deconditioning 01/13/2018  . Tremor of both hands 10/10/2017  . Urinary incontinence 10/10/2017  . Vasovagal syncope 10/02/2017  . Gout 07/12/2017  . B12 deficiency 04/11/2017  . Lumbago 02/25/2015  . Essential tremor   . Glaucoma  01/08/2010  . Chronic kidney disease (CKD), stage IV (severe) ([place]) 12/10/2008  . DM (diabetes mellitus), secondary, uncontrolled, with renal complications ([place]) 45/36/4680  . Diabetic neuropathy ([place]) 02/22/2006  . Dyslipidemia 02/22/2006  . 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 CARPAL TUNNEL SYNDROME, BILATERAL 02/22/2006    Past Surgical History:  Procedure Laterality Date  . AV FISTULA PLACEMENT Left 05/23/2018   Procedure: ARTERIOVENOUS (AV) FISTULA CREATION LEFT ARM;  Surgeon: Waynetta Sandy, MD;  Location: [facility];  Service: Vascular;  Laterality: Left;  . BASCILIC VEIN TRANSPOSITION Left 08/03/2018   Procedure: SECOND STAGE BASILIC VEIN TRANSPOSITION LEFT ARM;  Surgeon: Waynetta Sandy, MD;  Location: [facility];  Service: Vascular;  Laterality: Left;  . CATARACT EXTRACTION BILATERAL W/ ANTERIOR VITRECTOMY  05/2010  . COLONOSCOPY     [facility]/7-8 years     OB History   No obstetric history on file.     Family History  Problem Relation Age of Onset  . Arthritis Son   . Diabetes Son   . Hypertension Son   . Colon cancer Son 74  . Neuropathy Son        chemotherapy related  . Heart attack Mother        Died age 30s  . Heart attack Father        No details 74s  . Heart disease Sister        Slight heart attack, died age 3    Social History  Tobacco Use  . Smoking status: Never Smoker  . Smokeless tobacco: Never Used  Vaping Use  . Vaping Use: Never used  Substance Use Topics  . Alcohol use: No  . Drug use: No    Home Medications Prior to Admission medications   Medication Sig Start Date End Date Taking? Authorizing Provider  acetaminophen (TYLENOL) 500 MG tablet Take 500 mg by mouth 2 (two) times daily.    [provider]  allopurinol (ZYLOPRIM) 100 MG tablet TAKE 2 TABLETS BY MOUTH DAILY Patient taking differently: Take 100 mg by mouth 2 (two) times daily. 05/13/20   Binnie Rail, MD  aspirin EC 81 MG EC tablet Take 1 tablet (81 mg total)  by mouth daily. Swallow whole. 05/22/20   Pokhrel, Corrie Mckusick, MD  atorvastatin (LIPITOR) 40 MG tablet TAKE 1 TABLET BY MOUTH DAILY Patient taking differently: Take 40 mg by mouth daily. 11/05/19   Binnie Rail, MD  Cyanocobalamin (VITAMIN B-12) 2000 MCG TBCR Take 1 tablet (2,000 mcg total) by mouth daily. 07/24/18   Binnie Rail, MD  donepezil (ARICEPT) 5 MG tablet Take 5 mg by mouth at bedtime.    [provider]  levothyroxine (SYNTHROID) 25 MCG tablet Take 1 tablet (25 mcg total) by mouth daily. 07/03/20   Binnie Rail, MD  Torsemide 40 MG TABS Take 40 mg by mouth 2 (two) times daily. 07/21/20 08/20/20  Binnie Rail, MD    Allergies    Lisinopril  Review of Systems   Review of Systems  Gastrointestinal: Negative for abdominal pain.  Skin: Negative for rash.  Neurological: Positive for weakness. Negative for headaches.  All other systems reviewed and are negative.   Physical Exam Updated Vital Signs BP 100/66   Pulse 83   Temp 97.7 F (36.5 C) (Axillary)   Resp (!) 41   Ht 5\' 5"  (1.651 m)   Wt 77.6 kg   SpO2 96%   BMI 28.47 kg/m   Physical Exam Vitals and nursing note reviewed.  Constitutional:      Appearance: She is well-developed.  HENT:     Head: Normocephalic and atraumatic.     Mouth/Throat:     Mouth: Mucous membranes are moist.     Pharynx: Oropharynx is clear.  Eyes:     Pupils: Pupils are equal, round, and reactive to light.  Cardiovascular:     Rate and Rhythm: Normal rate and regular rhythm.  Pulmonary:     Effort: No respiratory distress.     Breath sounds: No stridor.  Abdominal:     General: There is no distension.  Musculoskeletal:        General: No swelling or tenderness. Normal range of motion.     Cervical back: Normal range of motion.  Skin:    General: Skin is warm and dry.  Neurological:     General: No focal deficit present.     Mental Status: She is alert. Mental status is at baseline.     ED Results / Procedures /  Treatments   Labs (all labs ordered are listed, but only abnormal results are displayed) Labs Reviewed  CBC WITH DIFFERENTIAL/PLATELET - Abnormal; Notable for the following components:      Result Value   RBC 2.81 (*)    Hemoglobin 9.3 (*)    HCT 28.2 (*)    MCV 100.4 (*)    RDW 20.0 (*)    Platelets 81 (*)    nRBC 3.6 (*)  All other components within normal limits  COMPREHENSIVE METABOLIC PANEL - Abnormal; Notable for the following components:   Sodium 125 (*)    Chloride 90 (*)    CO2 21 (*)    Glucose, Bld 122 (*)    BUN 79 (*)    Creatinine, Ser 4.25 (*)    Total Protein 6.3 (*)    Albumin 3.1 (*)    AST 86 (*)    Total Bilirubin 1.5 (*)    GFR, Estimated 10 (*)    All other components within normal limits  CBG MONITORING, ED - Abnormal; Notable for the following components:   Glucose-Capillary 132 (*)    All other components within normal limits  URINE CULTURE  URINALYSIS, ROUTINE W REFLEX MICROSCOPIC  PATHOLOGIST SMEAR REVIEW    EKG EKG Interpretation  Date/Time:  Friday Jul 25 2020 19:35:39 EDT Ventricular Rate:  95 PR Interval:  186 QRS Duration: 186 QT Interval:  442 QTC Calculation: 555 R Axis:   -62 Text Interpretation: Normal sinus rhythm Left axis deviation Non-specific intra-ventricular conduction block Minimal voltage criteria for LVH, may be normal variant ( Cornell product ) Possible Lateral infarct , age undetermined Abnormal ECG Confirmed by Merrily Pew [phone]) on 07/25/2020 10:26:10 PM   Radiology CT Head Wo Contrast  Result Date: 07/25/2020 CLINICAL DATA:  Mental status change EXAM: CT HEAD WITHOUT CONTRAST TECHNIQUE: Contiguous axial images were obtained from the base of the skull through the vertex without intravenous contrast. COMPARISON:  CT 06/20/2020, 05/14/2020 FINDINGS: Brain: No acute territorial infarction, hemorrhage or intracranial mass. Moderate atrophy. Moderate hypodensity in the white matter consistent with chronic small  vessel ischemic change. Chronic appearing lacunar infarcts in the bilateral basal ganglia and right thalamus. Stable ventricle size Vascular: No hyperdense vessels.  Carotid vascular calcification Skull: Normal. Negative for fracture or focal lesion. Sinuses/Orbits: No acute finding. Other: None IMPRESSION: 1. No CT evidence for acute intracranial abnormality. 2. Atrophy and chronic small vessel ischemic change of the white matter Electronically Signed   By: Donavan Foil M.D.   On: 07/25/2020 19:57   DG Chest Portable 1 View  Result Date: 07/25/2020 CLINICAL DATA:  Weakness EXAM: PORTABLE CHEST 1 VIEW COMPARISON:  06/19/2020 FINDINGS: Cardiac shadow is enlarged but stable. The lungs are well aerated bilaterally. Minimal left basilar atelectasis is noted. No sizable effusion or pneumothorax is noted. No bony abnormality is seen. IMPRESSION: Mild left basilar atelectasis. Electronically Signed   By: Inez Catalina M.D.   On: 07/25/2020 21:59    Procedures Procedures   Medications Ordered in ED Medications  lactated ringers bolus 500 mL (500 mLs Intravenous New Bag/Given 07/25/20 2140)    ED Course  I have reviewed the triage vital signs and the nursing notes.  Pertinent labs & imaging results that were available during my care of the patient were reviewed by me and considered in my medical decision making (see chart for details).    MDM Rules/Calculators/A&P                         eval for generfalized weakness Apparently is not on dialysis. Still pending urine. Could be dehydration and Uremia that are causing her issues however she is not a dialysis candidate per her nephrologist. Plan to await urine and fluid bolus. If normal and appears improved per daughter, can be discharged. Any question or other issues, may need admission for further fluids/observation and workup.   Final Clinical Impression(s) / ED  Diagnoses Final diagnoses:  None    Rx / DC Orders ED Discharge Orders    None        Franchot Pollitt, Corene Cornea, MD 07/25/20 [phone]

## 2020-07-25 NOTE — ED Provider Notes (Signed)
Emergency Medicine Provider Triage Evaluation Note  Erin Liu , a 80 y.o. female  was evaluated in triage.  Brought in by daughter, eating dinner today at 5:30pm, not acting normal while trying to eat.  Last normal yesterday. Normally walks with assistance.  Slid to the floor earlier this week (Wednesday?), not on thinners.  Review of Systems  Positive: weakness Negative: Vomiting   Physical Exam  There were no vitals taken for this visit. Gen:   No distress, answers some questions Resp:  Normal effort  MSK:   Equal grip strength Other:  Follows simple commands (open eyes, squeeze hands)  Medical Decision Making  Medically screening exam initiated at 7:26 PM.  Appropriate orders placed.  Erin Liu was informed that the remainder of the evaluation will be completed by another provider, this initial triage assessment does not replace that evaluation, and the importance of remaining in the ED until their evaluation is complete.     Tacy Learn, PA-C 07/25/20 1932    Davonna Belling, MD 07/25/20 [phone]

## 2020-07-25 NOTE — ED Notes (Signed)
Patient arrives from home via [facility] with daughter. Patient somnolent with eyes closed upon arrival to room. Patient able to follow commands. Unable to walk, which is apparently baseline. Patient somewhat responsive to writer when moving patient from [facility] to stretcher. Per daughter, patient has hx of dementia and has been like this upon awakening today.

## 2020-07-25 NOTE — ED Notes (Signed)
Blankets given to patient at this time. Pure wick placed.

## 2020-07-25 NOTE — Care Management (Signed)
ED RNCM reviewed patient's record. Patient was brought in by daughter today, and  evaluated in the ED several days ago for similar complaints.  Patient is followed by  [facility] Palliative team last visit 5/16. PCP Dr. Jenness Corner, noted that patient will be [facility] with [facility] 6/1.

## 2020-07-26 ENCOUNTER — Encounter (HOSPITAL_COMMUNITY): Payer: Self-pay | Admitting: Family Medicine

## 2020-07-26 DIAGNOSIS — N185 Chronic kidney disease, stage 5: Secondary | ICD-10-CM | POA: Diagnosis not present

## 2020-07-26 DIAGNOSIS — Z833 Family history of diabetes mellitus: Secondary | ICD-10-CM | POA: Diagnosis not present

## 2020-07-26 DIAGNOSIS — Z20822 Contact with and (suspected) exposure to covid-19: Secondary | ICD-10-CM | POA: Diagnosis present

## 2020-07-26 DIAGNOSIS — I252 Old myocardial infarction: Secondary | ICD-10-CM | POA: Diagnosis not present

## 2020-07-26 DIAGNOSIS — Z888 Allergy status to other drugs, medicaments and biological substances status: Secondary | ICD-10-CM | POA: Diagnosis not present

## 2020-07-26 DIAGNOSIS — Z7982 Long term (current) use of aspirin: Secondary | ICD-10-CM | POA: Diagnosis not present

## 2020-07-26 DIAGNOSIS — I429 Cardiomyopathy, unspecified: Secondary | ICD-10-CM | POA: Diagnosis present

## 2020-07-26 DIAGNOSIS — G9340 Encephalopathy, unspecified: Secondary | ICD-10-CM | POA: Diagnosis not present

## 2020-07-26 DIAGNOSIS — E871 Hypo-osmolality and hyponatremia: Secondary | ICD-10-CM | POA: Diagnosis present

## 2020-07-26 DIAGNOSIS — Z66 Do not resuscitate: Secondary | ICD-10-CM | POA: Diagnosis not present

## 2020-07-26 DIAGNOSIS — Z9842 Cataract extraction status, left eye: Secondary | ICD-10-CM | POA: Diagnosis not present

## 2020-07-26 DIAGNOSIS — Z515 Encounter for palliative care: Secondary | ICD-10-CM | POA: Diagnosis not present

## 2020-07-26 DIAGNOSIS — G9341 Metabolic encephalopathy: Secondary | ICD-10-CM | POA: Diagnosis present

## 2020-07-26 DIAGNOSIS — I959 Hypotension, unspecified: Secondary | ICD-10-CM | POA: Diagnosis not present

## 2020-07-26 DIAGNOSIS — N289 Disorder of kidney and ureter, unspecified: Secondary | ICD-10-CM | POA: Diagnosis present

## 2020-07-26 DIAGNOSIS — R570 Cardiogenic shock: Secondary | ICD-10-CM | POA: Diagnosis present

## 2020-07-26 DIAGNOSIS — L8990 Pressure ulcer of unspecified site, unspecified stage: Secondary | ICD-10-CM | POA: Diagnosis present

## 2020-07-26 DIAGNOSIS — I132 Hypertensive heart and chronic kidney disease with heart failure and with stage 5 chronic kidney disease, or end stage renal disease: Secondary | ICD-10-CM | POA: Diagnosis present

## 2020-07-26 DIAGNOSIS — Z9841 Cataract extraction status, right eye: Secondary | ICD-10-CM | POA: Diagnosis not present

## 2020-07-26 DIAGNOSIS — H409 Unspecified glaucoma: Secondary | ICD-10-CM | POA: Diagnosis present

## 2020-07-26 DIAGNOSIS — I5022 Chronic systolic (congestive) heart failure: Secondary | ICD-10-CM

## 2020-07-26 DIAGNOSIS — F0390 Unspecified dementia without behavioral disturbance: Secondary | ICD-10-CM | POA: Diagnosis present

## 2020-07-26 DIAGNOSIS — Z79899 Other long term (current) drug therapy: Secondary | ICD-10-CM | POA: Diagnosis not present

## 2020-07-26 DIAGNOSIS — Z800 Family history of malignant neoplasm of digestive organs: Secondary | ICD-10-CM | POA: Diagnosis not present

## 2020-07-26 DIAGNOSIS — E1142 Type 2 diabetes mellitus with diabetic polyneuropathy: Secondary | ICD-10-CM | POA: Diagnosis present

## 2020-07-26 DIAGNOSIS — Z7984 Long term (current) use of oral hypoglycemic drugs: Secondary | ICD-10-CM | POA: Diagnosis not present

## 2020-07-26 DIAGNOSIS — E785 Hyperlipidemia, unspecified: Secondary | ICD-10-CM | POA: Diagnosis present

## 2020-07-26 DIAGNOSIS — Z8249 Family history of ischemic heart disease and other diseases of the circulatory system: Secondary | ICD-10-CM | POA: Diagnosis not present

## 2020-07-26 LAB — AMMONIA: Ammonia: 12 umol/L (ref 9–35)

## 2020-07-26 LAB — BASIC METABOLIC PANEL
Anion gap: 17 — ABNORMAL HIGH (ref 5–15)
BUN: 80 mg/dL — ABNORMAL HIGH (ref 8–23)
CO2: 21 mmol/L — ABNORMAL LOW (ref 22–32)
Calcium: 9.7 mg/dL (ref 8.9–10.3)
Chloride: 89 mmol/L — ABNORMAL LOW (ref 98–111)
Creatinine, Ser: 4.1 mg/dL — ABNORMAL HIGH (ref 0.44–1.00)
GFR, Estimated: 10 mL/min — ABNORMAL LOW (ref >60)
Glucose, Bld: 112 mg/dL — ABNORMAL HIGH (ref 70–99)
Potassium: 4.2 mmol/L (ref 3.5–5.1)
Sodium: 127 mmol/L — ABNORMAL LOW (ref 135–145)

## 2020-07-26 LAB — TSH: TSH: 5.779 u[IU]/mL — ABNORMAL HIGH (ref 0.350–4.500)

## 2020-07-26 LAB — RESP PANEL BY RT-PCR (FLU A&B, COVID) ARPGX2
Influenza A by PCR: NEGATIVE
Influenza B by PCR: NEGATIVE
SARS Coronavirus 2 by RT PCR: NEGATIVE

## 2020-07-26 LAB — CBC
HCT: 31.1 % — ABNORMAL LOW (ref 36.0–46.0)
Hemoglobin: 10.4 g/dL — ABNORMAL LOW (ref 12.0–15.0)
MCH: 33.3 pg (ref 26.0–34.0)
MCHC: 33.4 g/dL (ref 30.0–36.0)
MCV: 99.7 fL (ref 80.0–100.0)
Platelets: 74 10*3/uL — ABNORMAL LOW (ref 150–400)
RBC: 3.12 MIL/uL — ABNORMAL LOW (ref 3.87–5.11)
RDW: 20.1 % — ABNORMAL HIGH (ref 11.5–15.5)
WBC: 4.8 10*3/uL (ref 4.0–10.5)
nRBC: 2.9 % — ABNORMAL HIGH (ref 0.0–0.2)

## 2020-07-26 LAB — GLUCOSE, CAPILLARY
Glucose-Capillary: 118 mg/dL — ABNORMAL HIGH (ref 70–99)
Glucose-Capillary: 132 mg/dL — ABNORMAL HIGH (ref 70–99)
Glucose-Capillary: 152 mg/dL — ABNORMAL HIGH (ref 70–99)
Glucose-Capillary: 164 mg/dL — ABNORMAL HIGH (ref 70–99)
Glucose-Capillary: 143 mg/dL — ABNORMAL HIGH (ref 70–99)

## 2020-07-26 LAB — VITAMIN B12: Vitamin B-12: >7500 pg/mL — ABNORMAL HIGH (ref 180–914)

## 2020-07-26 LAB — T4, FREE: Free T4: 0.94 ng/dL (ref 0.61–1.12)

## 2020-07-26 LAB — RPR: RPR Ser Ql: NONREACTIVE

## 2020-07-26 LAB — HIV ANTIBODY (ROUTINE TESTING W REFLEX): HIV Screen 4th Generation wRfx: NONREACTIVE

## 2020-07-26 MED ORDER — ACETAMINOPHEN 650 MG RE SUPP: 650.0000 mg | Freq: Four times a day (QID) | RECTAL | Status: DC | PRN

## 2020-07-26 MED ORDER — SODIUM CHLORIDE 0.9 % IV SOLN: 250.0000 mL | INTRAVENOUS | Status: DC | PRN

## 2020-07-26 MED ORDER — HEPARIN SODIUM (PORCINE) 5000 UNIT/ML IJ SOLN: 5000.0000 [IU] | Freq: Three times a day (TID) | INTRAMUSCULAR | Status: DC

## 2020-07-26 MED ORDER — ASPIRIN EC 81 MG PO TBEC
81.0000 mg | DELAYED_RELEASE_TABLET | Freq: Every day | ORAL | Status: DC
  Administered 2020-07-26 – 2020-07-28 (×3): 81 mg via ORAL
  Filled 2020-07-26 (×3): qty 1

## 2020-07-26 MED ORDER — ATORVASTATIN CALCIUM 40 MG PO TABS
40.0000 mg | ORAL_TABLET | Freq: Every day | ORAL | Status: DC
  Administered 2020-07-26 – 2020-07-28 (×3): 40 mg via ORAL
  Filled 2020-07-26 (×3): qty 1

## 2020-07-26 MED ORDER — DONEPEZIL HCL 5 MG PO TABS
5.0000 mg | ORAL_TABLET | Freq: Every day | ORAL | Status: DC
  Administered 2020-07-26 – 2020-07-27 (×2): 5 mg via ORAL
  Filled 2020-07-26 (×2): qty 1

## 2020-07-26 MED ORDER — ADULT MULTIVITAMIN W/MINERALS CH
1.0000 | ORAL_TABLET | Freq: Every day | ORAL | Status: DC
  Administered 2020-07-26 – 2020-07-28 (×3): 1 via ORAL
  Filled 2020-07-26 (×4): qty 1

## 2020-07-26 MED ORDER — SODIUM CHLORIDE 0.9 % IV SOLN: INTRAVENOUS | Status: DC

## 2020-07-26 MED ORDER — ACETAMINOPHEN 325 MG PO TABS: 650.0000 mg | ORAL_TABLET | Freq: Four times a day (QID) | ORAL | Status: DC | PRN

## 2020-07-26 MED ORDER — ALLOPURINOL 100 MG PO TABS
100.0000 mg | ORAL_TABLET | Freq: Two times a day (BID) | ORAL | Status: DC
  Administered 2020-07-26 – 2020-07-28 (×5): 100 mg via ORAL
  Filled 2020-07-26 (×6): qty 1

## 2020-07-26 MED ORDER — LEVOTHYROXINE SODIUM 25 MCG PO TABS
25.0000 ug | ORAL_TABLET | Freq: Every day | ORAL | Status: DC
  Administered 2020-07-27 – 2020-07-28 (×2): 25 ug via ORAL
  Filled 2020-07-26 (×2): qty 1

## 2020-07-26 MED ORDER — VITAMIN B-12 1000 MCG PO TABS
2000.0000 ug | ORAL_TABLET | Freq: Every day | ORAL | Status: DC
  Administered 2020-07-26 – 2020-07-28 (×3): 2000 ug via ORAL
  Filled 2020-07-26 (×3): qty 2

## 2020-07-26 NOTE — Progress Notes (Signed)
Patient was seen and examined.  Admitted by nighttime hospitalist early morning hours.  Patient's daughter Ms. Brenda at bedside and we discussed in detail about her care.  Brief 80 year old female with history of CKD stage V not a dialysis candidate, cardiomyopathy with ejection fraction 20%, anemia and dementia presenting to the emergency department for evaluation of being somnolent.  She lives at home with her daughter.  3 children make combined decision for her.  She has been followed by community palliative.  Recently admitted to the hospital with cardiogenic shock, troponin 9000.  Seen by nephrology and decided against hemodialysis.  Acute metabolic encephalopathy that is probably multifactorial/ESRD not a candidate for dialysis: Patient is a probably a hospice candidate, however patient's family wants to continue supportive care and explore if she has any other options even if she is not a dialysis candidate. Patient looks very frail and debilitated, she has tremors at baseline. Tried to discuss hospice with the family, however they are not ready to have that discussion yet. Will consult palliative care. Will consult PT OT today as family reported that patient has been walking on a walker until last week.  Plan: Monitor in the hospital.  There is really no intervention to reverse this patient's clinical status. We may just give more time to the patient and family to realize the severity of illness. Will continue goal of care.  Will consult PT OT to see if she can do PT OT at home.  No charge visit.

## 2020-07-26 NOTE — Evaluation (Signed)
Clinical/Bedside Swallow Evaluation Patient Details  Name: Erin Liu MRN: [id number] Date of Birth: Jun 28, 1940  Today's Date: 07/26/2020 Time: SLP Start Time (ACUTE ONLY): 2683 SLP Stop Time (ACUTE ONLY): 1416 SLP Time Calculation (min) (ACUTE ONLY): 17 min  Past Medical History:  Past Medical History:  Diagnosis Date  . ANEMIA, NORMOCYTIC   . Arthritis    back  . BREAST CYST, HX OF   . CARPAL TUNNEL SYNDROME, BILATERAL   . DIABETES MELLITUS, TYPE II   . Glaucoma   . HYPERLIPIDEMIA   . HYPERTENSION   . OBESITY   . PERIPHERAL NEUROPATHY   . RENAL FAILURE, CHRONIC    Stage 4   Past Surgical History:  Past Surgical History:  Procedure Laterality Date  . AV FISTULA PLACEMENT Left 05/23/2018   Procedure: ARTERIOVENOUS (AV) FISTULA CREATION LEFT ARM;  Surgeon: Waynetta Sandy, MD;  Location: [place];  Service: Vascular;  Laterality: Left;  . BASCILIC VEIN TRANSPOSITION Left 08/03/2018   Procedure: SECOND STAGE BASILIC VEIN TRANSPOSITION LEFT ARM;  Surgeon: Waynetta Sandy, MD;  Location: [place];  Service: Vascular;  Laterality: Left;  . CATARACT EXTRACTION BILATERAL W/ ANTERIOR VITRECTOMY  05/2010  . COLONOSCOPY     [facility]/7-8 years   HPI:  Erin Liu is an 79 yo female presenting with AMS, somnolence. CT Head negative. PMH includes: ESRD (not a candidate for dialysis), dementia, glaucoma, DMII   Assessment / Plan / Recommendation Clinical Impression  Erin Liu is drowsy but interactive when stimulated, and consumes a variety of textures without overt signs of aspiration. She does need cues to attend to intake and for awareness particularly during acceptance, which is facilitate some by hand-over-hand assistance for self-feeding. Her daughter suggests presenting food/straws to the Erin Liu's L side due to dental issues, and although mastication and oral transit still seem prolonged, she ultimately has good oral clearance. Note that overnight Erin Liu appeared to have difficulty swallowing -  suspect that her safety with intake may fluctuate if her mentation and LOA are also fluctuating. Discussed this with daughter/granddaughter as well, encouraging them to monitor her mentation prior to offering any POs. Given oral delays and report of baseline diet, will adjust to Dys 3 (mechanical soft) solids, thin liquids. Will f/u at least briefly for tolerance SLP Visit Diagnosis: Dysphagia, unspecified (R13.10)    Aspiration Risk  Mild aspiration risk    Diet Recommendation Dysphagia 3 (Mech soft);Thin liquid   Liquid Administration via: Cup;Straw Medication Administration: Whole meds with puree Supervision: Staff to assist with self feeding;Full supervision/cueing for compensatory strategies Compensations: Minimize environmental distractions;Slow rate;Small sips/bites Postural Changes: Seated upright at 90 degrees    Other  Recommendations Oral Care Recommendations: Oral care BID   Follow up Recommendations  (tba)      Frequency and Duration min 2x/week  2 weeks       Prognosis Prognosis for Safe Diet Advancement:  (baseline diet) Barriers to Reach Goals: Cognitive deficits      Swallow Study   General HPI: Erin Liu is an 80 yo female presenting with AMS, somnolence. CT Head negative. PMH includes: ESRD (not a candidate for dialysis), dementia, glaucoma, DMII Type of Study: Bedside Swallow Evaluation Previous Swallow Assessment: none in chart Diet Prior to this Study: Regular;Thin liquids Temperature Spikes Noted: No Respiratory Status: Room air History of Recent Intubation: No Behavior/Cognition: Lethargic/Drowsy;Cooperative;Requires cueing Oral Cavity Assessment: Within Functional Limits Oral Care Completed by SLP: No Oral Cavity - Dentition: Poor condition;Missing dentition Self-Feeding Abilities: Total assist Patient Positioning: Upright  in bed Baseline Vocal Quality: Low vocal intensity Volitional Cough: Weak Volitional Swallow: Able to elicit    Oral/Motor/Sensory  Function Overall Oral Motor/Sensory Function: Within functional limits   Ice Chips Ice chips: Not tested   Thin Liquid Thin Liquid: Within functional limits Presentation: Straw    Nectar Thick Nectar Thick Liquid: Not tested   Honey Thick Honey Thick Liquid: Not tested   Puree Puree: Impaired Presentation: Spoon Oral Phase Impairments: Poor awareness of bolus   Solid     Solid: Impaired Presentation: Spoon Oral Phase Impairments: Impaired mastication Oral Phase Functional Implications: Prolonged oral transit      Osie Bond., M.A. [place] [facility] Services Pager [phone] Office [phone]  07/26/2020,2:47 PM

## 2020-07-26 NOTE — ED Provider Notes (Signed)
  Provider Note MRN:  [id number]  Arrival date & time: 07/26/20    ED Course and Medical Decision Making  Assumed care from Dr. Dayna Barker at shift change.  History of CKD, diabetes here with altered mental status, more sleepy than usual.  Has happened before.  Recent UTI.  Awaiting urinalysis and will reassess.  Kidney function worsening, advised/told by nephrology that she is not a good candidate for dialysis.  On my reevaluation patient does not follow commands, quite somnolent.  Despite IV fluids, unable to obtain a decent urinalysis sample even with in and out catheterization.  Given the altered mental status and worsening renal function, will admit to medicine.  Procedures  Final Clinical Impressions(s) / ED Diagnoses     ICD-10-CM   1. Altered mental status, unspecified altered mental status type  R41.82   2. Renal impairment  N28.9     ED Discharge Orders    None      Discharge Instructions   None     Barth Kirks. [place] Small, [place] [email or web address]    Maudie Flakes, MD 07/26/20 [phone]

## 2020-07-26 NOTE — ED Notes (Signed)
Attempted In and out catheterization on patient. Unable to retrieve any urine output. White discharge in tube, no urine. MD aware. Awaiting next set of orders.

## 2020-07-26 NOTE — Evaluation (Signed)
Physical Therapy Evaluation Patient Details Name: Erin Liu MRN: [id number] DOB: December 13, 1940 Today's Date: 07/26/2020   History of Present Illness  80 y/o female presented to ED on 5/20 with AMS. Chest x-ray with mild L basilar atelectasis. Head CT negative for acute intracranial abnormality. PMH: CKD, cardiomyopathy with EF 20-25%, anemia, dementia, HTN, HLD, peripheral neuropathy.  Clinical Impression  PTA, patient lives with son and daughter assists patient. Patient was requiring max-totalA+2 for transfers. Patient currently requiring totalA for bed mobility this session. Patient lethargic but became more alert with movement. Required totalA to maintain sitting EOB. Patient presents with generalized weakness, impaired sitting balance, decreased activity tolerance, impaired cognition, and impaired functional mobility. Patient will benefit from skilled PT services during acute stay to address listed deficits and assist with decreasing caregiver burden of care. Recommend HHPT following discharge to assist with maximizing functional mobility. Patient's family plans to have patient start PACE program on June 1.     Follow Up Recommendations Home health PT;Supervision/Assistance - 24 hour    Equipment Recommendations  Other (comment) (hoyer lift)    Recommendations for Other Services       Precautions / Restrictions Precautions Precautions: Fall Restrictions Weight Bearing Restrictions: No      Mobility  Bed Mobility Overal bed mobility: Needs Assistance Bed Mobility: Supine to Sit;Sit to Supine     Supine to sit: Total assist Sit to supine: Total assist   General bed mobility comments: totalA for all aspects of bed mobility. No initiation by patient    Transfers                 General transfer comment: deferred.  Ambulation/Gait                Stairs            Wheelchair Mobility    Modified Rankin (Stroke Patients Only)       Balance  Overall balance assessment: Needs assistance Sitting-balance support: No upper extremity supported;Feet unsupported Sitting balance-Leahy Scale: Zero Sitting balance - Comments: totalA to maintain sitting balance                                     Pertinent Vitals/Pain Pain Assessment: Faces Faces Pain Scale: No hurt Pain Intervention(s): Monitored during session    Home Living Family/patient expects to be discharged to:: Private residence Living Arrangements: Children;Other (Comment) (lives with son. Daughter helps on Monday and Tuesday) Available Help at Discharge: Family;Available 24 hours/day Type of Home: House       Home Layout: One level Home Equipment: [place] - 2 wheels;Cane - quad;Shower seat;Grab bars - tub/shower;Wheelchair - manual      Prior Function Level of Independence: Needs assistance   Gait / Transfers Assistance Needed: 2-3 days PTA, patient required maxA-totalA+2 for transfers. Not ambulatory  ADL's / Homemaking Assistance Needed: requires assistance for all ADLs        Hand Dominance        Extremity/Trunk Assessment   Upper Extremity Assessment Upper Extremity Assessment: Defer to OT evaluation    Lower Extremity Assessment Lower Extremity Assessment: Generalized weakness;Difficult to assess due to impaired cognition (no active movement noted but patient's daughter states she is able to move them in the bed at home)    Cervical / Trunk Assessment Cervical / Trunk Assessment: Kyphotic  Communication   Communication: No difficulties  Cognition Arousal/Alertness: Lethargic;Awake/alert Behavior During  Therapy: Flat affect Overall Cognitive Status: History of cognitive impairments - at baseline                                 General Comments: A&Ox2, unable to state time or situation. Difficulty following commands. Became more alert with movement      General Comments      Exercises     Assessment/Plan     PT Assessment Patient needs continued PT services  PT Problem List Decreased strength;Decreased range of motion;Decreased activity tolerance;Decreased balance;Decreased mobility;Decreased cognition       PT Treatment Interventions DME instruction;Functional mobility training;Therapeutic activities;Therapeutic exercise;Balance training;Patient/family education    PT Goals (Current goals can be found in the Care Plan section)  Acute Rehab PT Goals Patient Stated Goal: did not state PT Goal Formulation: With family Time For Goal Achievement: 08/09/20 Potential to Achieve Goals: Poor    Frequency Min 3X/week   Barriers to discharge        Co-evaluation               AM-PAC PT "6 Clicks" Mobility  Outcome Measure Help needed turning from your back to your side while in a flat bed without using bedrails?: Total Help needed moving from lying on your back to sitting on the side of a flat bed without using bedrails?: Total Help needed moving to and from a bed to a chair (including a wheelchair)?: Total Help needed standing up from a chair using your arms (e.g., wheelchair or bedside chair)?: Total Help needed to walk in hospital room?: Total Help needed climbing 3-5 steps with a railing? : Total 6 Click Score: 6    End of Session   Activity Tolerance: Patient limited by lethargy Patient left: in bed;with call bell/phone within reach;with bed alarm set;with family/visitor present Nurse Communication: Mobility status PT Visit Diagnosis: Muscle weakness (generalized) (M62.81);Difficulty in walking, not elsewhere classified (R26.2)    Time: 6203-5597 PT Time Calculation (min) (ACUTE ONLY): 23 min   Charges:   PT Evaluation $PT Eval Moderate Complexity: 1 Mod PT Treatments $Therapeutic Activity: 8-22 mins        Rexanna Louthan A. Gilford Rile PT, DPT Acute Rehabilitation Services Pager [phone] Office [phone]   Linna Hoff 07/26/2020, 5:05 PM

## 2020-07-26 NOTE — Consult Note (Signed)
Palliative Medicine  Name: Erin Liu Date: 07/26/2020 MRN: [id number]  DOB: 1940/05/07  Patient Care Team: Binnie Rail, MD as PCP - General (Internal Medicine) Minus Breeding, MD as PCP - Cardiology (Cardiology) Hayden Pedro, MD (Ophthalmology) Milus Banister, MD (Gastroenterology) Elmarie Shiley, MD (Nephrology) Knox Royalty, RN as Case Manager [place], Darnelle Maffucci, [facility] as Pharmacist (Pharmacist) Tomasa Blase, [facility] as Pharmacist (Pharmacist)    REASON FOR CONSULTATION: Erin Liu is a 80 y.o. female with multiple medical problems including ESRD not a dialysis candidate, cardiomyopathy with EF of 20 to 25%, and dementia, who was admitted to the hospital on 07/26/2020 with altered mental status.  Encephalopathy is likely metabolic.  Palliative care was consulted up address goals.  SOCIAL HISTORY:     reports that she has never smoked. She has never used smokeless tobacco. She reports that she does not drink alcohol and does not use drugs.  Patient is widowed.  She lives at home with her son.  She has a daughter who lives nearby and another son in [place].  Patient worked in a Clinical research associate.  ADVANCE DIRECTIVES:  Does not have  CODE STATUS: Full code  PAST MEDICAL HISTORY: Past Medical History:  Diagnosis Date  . ANEMIA, NORMOCYTIC   . Arthritis    back  . BREAST CYST, HX OF   . CARPAL TUNNEL SYNDROME, BILATERAL   . DIABETES MELLITUS, TYPE II   . Glaucoma   . HYPERLIPIDEMIA   . HYPERTENSION   . OBESITY   . PERIPHERAL NEUROPATHY   . RENAL FAILURE, CHRONIC    Stage 4    PAST SURGICAL HISTORY:  Past Surgical History:  Procedure Laterality Date  . AV FISTULA PLACEMENT Left 05/23/2018   Procedure: ARTERIOVENOUS (AV) FISTULA CREATION LEFT ARM;  Surgeon: Waynetta Sandy, MD;  Location: [place];  Service: Vascular;  Laterality: Left;  . BASCILIC VEIN TRANSPOSITION Left 08/03/2018   Procedure: SECOND STAGE BASILIC VEIN TRANSPOSITION LEFT ARM;   Surgeon: Waynetta Sandy, MD;  Location: [place];  Service: Vascular;  Laterality: Left;  . CATARACT EXTRACTION BILATERAL W/ ANTERIOR VITRECTOMY  05/2010  . COLONOSCOPY     [facility]/7-8 years    HEMATOLOGY/ONCOLOGY HISTORY:  Oncology History   No history exists.    ALLERGIES:  is allergic to lisinopril.  MEDICATIONS:  Current Facility-Administered Medications  Medication Dose Route Frequency Provider Last Rate Last Admin  . 0.9 %  sodium chloride infusion  250 mL Intravenous PRN Opyd, Ilene Qua, MD      . acetaminophen (TYLENOL) tablet 650 mg  650 mg Oral Q6H PRN Opyd, Ilene Qua, MD       Or  . acetaminophen (TYLENOL) suppository 650 mg  650 mg Rectal Q6H PRN Opyd, Ilene Qua, MD      . allopurinol (ZYLOPRIM) tablet 100 mg  100 mg Oral BID Opyd, Ilene Qua, MD      . aspirin EC tablet 81 mg  81 mg Oral Daily Opyd, Ilene Qua, MD      . atorvastatin (LIPITOR) tablet 40 mg  40 mg Oral Daily Opyd, Ilene Qua, MD      . donepezil (ARICEPT) tablet 5 mg  5 mg Oral QHS Opyd, Ilene Qua, MD      . levothyroxine (SYNTHROID) tablet 25 mcg  25 mcg Oral Daily Opyd, Ilene Qua, MD      . multivitamin with minerals tablet 1 tablet  1 tablet Oral Daily Opyd, Christia Reading  S, MD      . vitamin B-12 (CYANOCOBALAMIN) tablet 2,000 mcg  2,000 mcg Oral Daily Opyd, Ilene Qua, MD        VITAL SIGNS: BP (!) 90/58   Pulse 83   Temp 98.9 F (37.2 C) (Oral)   Resp (!) 23   Ht _0  (1.651 m)   Wt 171 lb 1.2 oz (77.6 kg)   SpO2 98%   BMI 28.47 kg/m  Filed Weights   07/25/20 1928  Weight: 171 lb 1.2 oz (77.6 kg)    Estimated body mass index is 28.47 kg/m as calculated from the following:   Height as of this encounter: _1  (1.651 m).   Weight as of this encounter: 171 lb 1.2 oz (77.6 kg).  LABS: CBC:    Component Value Date/Time   WBC 4.8 07/26/2020 0355   HGB 10.4 (L) 07/26/2020 0355   HCT 31.1 (L) 07/26/2020 0355   PLT 74 (L) 07/26/2020 0355   MCV 99.7 07/26/2020 0355   NEUTROABS 3.0  07/25/2020 1939   LYMPHSABS 1.3 07/25/2020 1939   MONOABS 0.4 07/25/2020 1939   EOSABS 0.1 07/25/2020 1939   BASOSABS 0.0 07/25/2020 1939   Comprehensive Metabolic Panel:    Component Value Date/Time   NA 127 (L) 07/26/2020 0355   K 4.2 07/26/2020 0355   CL 89 (L) 07/26/2020 0355   CO2 21 (L) 07/26/2020 0355   BUN 80 (H) 07/26/2020 0355   CREATININE 4.10 (H) 07/26/2020 0355   CREATININE 3.07 (H) 09/12/2019 1606   GLUCOSE 112 (H) 07/26/2020 0355   CALCIUM 9.7 07/26/2020 0355   AST 86 (H) 07/25/2020 1939   ALT 41 07/25/2020 1939   ALKPHOS 95 07/25/2020 1939   BILITOT 1.5 (H) 07/25/2020 1939   PROT 6.3 (L) 07/25/2020 1939   ALBUMIN 3.1 (L) 07/25/2020 1939    RADIOGRAPHIC STUDIES: CT Head Wo Contrast  Result Date: 07/25/2020 CLINICAL DATA:  Mental status change EXAM: CT HEAD WITHOUT CONTRAST TECHNIQUE: Contiguous axial images were obtained from the base of the skull through the vertex without intravenous contrast. COMPARISON:  CT 06/20/2020, 05/14/2020 FINDINGS: Brain: No acute territorial infarction, hemorrhage or intracranial mass. Moderate atrophy. Moderate hypodensity in the white matter consistent with chronic small vessel ischemic change. Chronic appearing lacunar infarcts in the bilateral basal ganglia and right thalamus. Stable ventricle size Vascular: No hyperdense vessels.  Carotid vascular calcification Skull: Normal. Negative for fracture or focal lesion. Sinuses/Orbits: No acute finding. Other: None IMPRESSION: 1. No CT evidence for acute intracranial abnormality. 2. Atrophy and chronic small vessel ischemic change of the white matter Electronically Signed   By: Donavan Foil M.D.   On: 07/25/2020 19:57   DG Chest Portable 1 View  Result Date: 07/25/2020 CLINICAL DATA:  Weakness EXAM: PORTABLE CHEST 1 VIEW COMPARISON:  06/19/2020 FINDINGS: Cardiac shadow is enlarged but stable. The lungs are well aerated bilaterally. Minimal left basilar atelectasis is noted. No sizable  effusion or pneumothorax is noted. No bony abnormality is seen. IMPRESSION: Mild left basilar atelectasis. Electronically Signed   By: Inez Catalina M.D.   On: 07/25/2020 21:59    PERFORMANCE STATUS (ECOG) : 3 - Symptomatic, >50% confined to bed  Review of Systems Unable to complete  Physical Exam General: Frail appearing Pulmonary: Unlabored Extremities: no edema, no joint deformities Skin: no rashes Neurological: Weakness, confused  IMPRESSION: Patient wakes when stimulated and says a few words but remains confused and was unable to precipitate in a car/goggles.  I met  with her daughter, [name], who was at bedside.  Reportedly, patient has had some memory impairment and was diagnosed several months ago with dementia.  However, at baseline she is generally conversive with family.  She is often in the wheelchair and requires significant assistance from family with daily care. Family are with her 24/7 and are very supportive. Patient has been followed at home by [facility] palliative care.  She would qualify for hospice in the setting of ESRD and cardiomyopathy.  However, there is plan to admit patient to PACE services starting on 08/06/2020.  Daughter is in agreement with current scope of treatment.  She would like to give patient time with hope that patient will return to baseline.  However, we discussed the possibility that acute illness leads to further decline.  Patient does not have advance directives.  Decisions are made collectively with patient's 3 children.  Family has discussed CODE STATUS extensively in the past and both of patient's sons have requested that she remain a full code. Family recognize the probable futility with resuscitative efforts.  They would like an attempt made at resuscitation and then ultimately leave it up to God if such an attempt is successful.  PLAN: -Continue current scope of treatment -Patient is pending future PACE involvement -Plan for community  palliative care   Time Total: 60 minutes  Visit consisted of counseling and education dealing with the complex and emotionally intense issues of symptom management and palliative care in the setting of serious and potentially life-threatening illness.Greater than 50%  of this time was spent counseling and coordinating care related to the above assessment and plan.  Signed by: Altha Harm, PhD, NP-C

## 2020-07-26 NOTE — Progress Notes (Signed)
Came back to see the patient with less responsiveness and blood pressure 70/50 as called by bedside nurse. After much stimulation and movement, she is more awake and blood pressure 90/50. Patient responding.  Called daughter to update . Advised her to make no CPR decision. She is going to talk to her other siblings and call us back. She did not agree for no CPR at this time.   I will continue to discuss code status and if family agrees she would be a good candidate to go home with home hospice/ palliative. Since she is now with stable vitals, will stay at medical tele bed. She is not dialysis candidate and that is already discussed well and understood by family.

## 2020-07-26 NOTE — H&P (Signed)
History and Physical    Erin Liu Erin Liu DOB: 07/11/1940 DOA: 07/25/2020  PCP: Erin Rail, MD   Patient coming from: Home  Chief Complaint: Somnolent  HPI: Erin Liu is a 80 y.o. female with medical history significant for CKD V, cardiomyopathy with EF 20 to 25%, anemia, and dementia, now presenting to the emergency department for evaluation of somnolence.  Patient lives with family and her daughter reports that she has been more confused and somnolent since 07/24/2020.  She is nonambulatory at baseline but family notes that she is usually conversant.  She frequently stays in bed all day but can usually be woken to eat.  She was more difficult to wake, seemed more confused, and would not stay awake to eat last night.  She had not been complaining of anything recently.  Patient's daughter reports that patient is not a dialysis candidate based on her discussions with nephrology.  Patient was in cardiogenic shock in March, had troponin peak close to 9000, but his ischemic evaluation was not recommended by cardiology due to comorbid disease.  Patient has been followed by palliative care at home but family continues to ask that she be full code.  ED Course: Upon arrival to the ED, patient is found to be afebrile, saturating mid 90s on room air, and with blood pressure 104/67.  EKG features sinus rhythm with LAD, IVCD, and LVH.  Chest x-ray with mild left basilar atelectasis.  Head CT negative for acute intracranial abnormality.  Chemistry panel notable for sodium 125, BUN 79, and creatinine 4.25.  CBC features a macrocytic anemia with hemoglobin 9.3 and platelets 81,000.  Patient was given 500 cc of LR in the ED.  Review of Systems:  Unable to complete ROS due to the patient's clinical condition.  Past Medical History:  Diagnosis Date  . ANEMIA, NORMOCYTIC   . Arthritis    back  . BREAST CYST, HX OF   . CARPAL TUNNEL SYNDROME, BILATERAL   . DIABETES MELLITUS, TYPE II   .  Glaucoma   . HYPERLIPIDEMIA   . HYPERTENSION   . OBESITY   . PERIPHERAL NEUROPATHY   . RENAL FAILURE, CHRONIC    Stage 4    Past Surgical History:  Procedure Laterality Date  . AV FISTULA PLACEMENT Left 05/23/2018   Procedure: ARTERIOVENOUS (AV) FISTULA CREATION LEFT ARM;  Surgeon: Waynetta Sandy, MD;  Location: [place] [facility];  Service: Vascular;  Laterality: Left;  . BASCILIC VEIN TRANSPOSITION Left 08/03/2018   Procedure: SECOND STAGE BASILIC VEIN TRANSPOSITION LEFT ARM;  Surgeon: Waynetta Sandy, MD;  Location: [place];  Service: Vascular;  Laterality: Left;  . CATARACT EXTRACTION BILATERAL W/ ANTERIOR VITRECTOMY  05/2010  . COLONOSCOPY     [facility]/7-8 years    Social History:   reports that she has never smoked. She has never used smokeless tobacco. She reports that she does not drink alcohol and does not use drugs.  Allergies  Allergen Reactions  . Lisinopril Cough    Family History  Problem Relation Age of Onset  . Arthritis Son   . Diabetes Son   . Hypertension Son   . Colon cancer Son 85  . Neuropathy Son        chemotherapy related  . Heart attack Mother        Died age 73s  . Heart attack Father        No details 22s  . Heart disease Sister  Slight heart attack, died age 58     Prior to Admission medications   Medication Sig Start Date End Date Taking? Authorizing Provider  acetaminophen (TYLENOL) 500 MG tablet Take 500 mg by mouth 2 (two) times daily.   Yes [provider]  allopurinol (ZYLOPRIM) 100 MG tablet TAKE 2 TABLETS BY MOUTH DAILY Patient taking differently: Take 100 mg by mouth 2 (two) times daily. 05/13/20  Yes Burns, Claudina Lick, MD  aspirin EC 81 MG EC tablet Take 1 tablet (81 mg total) by mouth daily. Swallow whole. 05/22/20  Yes Pokhrel, Laxman, MD  atorvastatin (LIPITOR) 40 MG tablet TAKE 1 TABLET BY MOUTH DAILY Patient taking differently: Take 40 mg by mouth daily. 11/05/19  Yes Burns, Claudina Lick, MD  Cyanocobalamin (VITAMIN  B-12) 2000 MCG TBCR Take 1 tablet (2,000 mcg total) by mouth daily. 07/24/18  Yes Burns, Claudina Lick, MD  donepezil (ARICEPT) 5 MG tablet Take 5 mg by mouth at bedtime.   Yes [provider]  levothyroxine (SYNTHROID) 25 MCG tablet Take 1 tablet (25 mcg total) by mouth daily. 07/03/20  Yes Burns, Claudina Lick, MD  multivitamin-iron-minerals-folic acid (CENTRUM) chewable tablet Chew 1 tablet by mouth daily.   Yes [provider]  Torsemide 40 MG TABS Take 40 mg by mouth 2 (two) times daily. 07/21/20 08/20/20 Yes Erin Rail, MD    Physical Exam: Vitals:   07/26/20 0130 07/26/20 0200 07/26/20 0215 07/26/20 0230  BP: 100/69 106/67 102/71 104/67  Pulse: 81 79 81 78  Resp: 18 (!) 29 (!) 24 16  Temp:      TempSrc:      SpO2: 91% 94% 96% 95%  Weight:      Height:        Constitutional: NAD, somnolent   Eyes: PERTLA, lids and conjunctivae normal ENMT: Mucous membranes are moist. Posterior pharynx clear of any exudate or lesions.   Neck:  supple, no masses  Respiratory:  no wheezing, no crackles. No accessory muscle use.  Cardiovascular: S1 & S2 heard, regular rate and rhythm. Pretibial pitting edema, neck vein distension. Abdomen: No distension, no tenderness, soft. Bowel sounds active.  Musculoskeletal: no clubbing / cyanosis. No joint deformity upper and lower extremities.   Skin: superficial ulcerations at bilateral feet and ankles. Warm, dry, well-perfused. Neurologic: No gross facial asymmetry, PERRL. Sensation intact. Moving all extremities. Wakes briefly to loud voice but not speaking or following commands.    Labs and Imaging on Admission: I have personally reviewed following labs and imaging studies  CBC: Recent Labs  Lab 07/25/20 1939  WBC 4.8  NEUTROABS 3.0  HGB 9.3*  HCT 28.2*  MCV 100.4*  PLT 81*   Basic Metabolic Panel: Recent Labs  Lab 07/25/20 1939  NA 125*  K 4.0  CL 90*  CO2 21*  GLUCOSE 122*  BUN 79*  CREATININE 4.25*  CALCIUM 9.6    GFR: Estimated Creatinine Clearance: 10.9 mL/min (A) (by C-G formula based on SCr of 4.25 mg/dL (H)). Liver Function Tests: Recent Labs  Lab 07/25/20 1939  AST 86*  ALT 41  ALKPHOS 95  BILITOT 1.5*  PROT 6.3*  ALBUMIN 3.1*   No results for input(s): LIPASE, AMYLASE in the last 168 hours. No results for input(s): AMMONIA in the last 168 hours. Coagulation Profile: No results for input(s): INR, PROTIME in the last 168 hours. Cardiac Enzymes: No results for input(s): CKTOTAL, CKMB, CKMBINDEX, TROPONINI in the last 168 hours. BNP (last 3 results) Recent Labs  07/02/20 1147  PROBNP >4951.0*   HbA1C: No results for input(s): HGBA1C in the last 72 hours. CBG: Recent Labs  Lab 07/25/20 1931  GLUCAP 132*   Lipid Profile: No results for input(s): CHOL, HDL, LDLCALC, TRIG, CHOLHDL, LDLDIRECT in the last 72 hours. Thyroid Function Tests: No results for input(s): TSH, T4TOTAL, FREET4, T3FREE, THYROIDAB in the last 72 hours. Anemia Panel: No results for input(s): VITAMINB12, FOLATE, FERRITIN, TIBC, IRON, RETICCTPCT in the last 72 hours. Urine analysis:    Component Value Date/Time   COLORURINE YELLOW 07/17/2020 0940   APPEARANCEUR CLEAR 07/17/2020 0940   LABSPEC 1.015 07/17/2020 0940   PHURINE 5.0 07/17/2020 0940   GLUCOSEU NEGATIVE 07/17/2020 0940   HGBUR LARGE (A) 07/17/2020 0940   HGBUR moderate 06/16/2009 0928   BILIRUBINUR NEGATIVE 07/17/2020 0940   KETONESUR NEGATIVE 07/17/2020 0940   PROTEINUR NEGATIVE 06/20/2020 0955   UROBILINOGEN 0.2 07/17/2020 0940   NITRITE NEGATIVE 07/17/2020 0940   LEUKOCYTESUR MODERATE (A) 07/17/2020 0940   Sepsis Labs: @LABRCNTIP (procalcitonin:4,lacticidven:4) ) Recent Results (from the past 240 hour(s))  Urine Culture     Status: Abnormal   Collection Time: 07/17/20  9:40 AM   Specimen: Urine  Result Value Ref Range Status   Source: NOT GIVEN  Final   Status: FINAL  Final   Isolate 1: Enterococcus faecalis (A)  Final     Comment: Greater than 100,000 CFU/mL of Enterococcus faecalis      Susceptibility   Enterococcus faecalis - URINE CULTURE POSITIVE 1    AMPICILLIN <=2 Sensitive     VANCOMYCIN 1 Sensitive     NITROFURANTOIN* <=16 Sensitive      * Legend:S = Susceptible  I = IntermediateR = Resistant  NS = Not susceptible* = Not tested  NR = Not reported**NN = See antimicrobic comments     Radiological Exams on Admission: CT Head Wo Contrast  Result Date: 07/25/2020 CLINICAL DATA:  Mental status change EXAM: CT HEAD WITHOUT CONTRAST TECHNIQUE: Contiguous axial images were obtained from the base of the skull through the vertex without intravenous contrast. COMPARISON:  CT 06/20/2020, 05/14/2020 FINDINGS: Brain: No acute territorial infarction, hemorrhage or intracranial mass. Moderate atrophy. Moderate hypodensity in the white matter consistent with chronic small vessel ischemic change. Chronic appearing lacunar infarcts in the bilateral basal ganglia and right thalamus. Stable ventricle size Vascular: No hyperdense vessels.  Carotid vascular calcification Skull: Normal. Negative for fracture or focal lesion. Sinuses/Orbits: No acute finding. Other: None IMPRESSION: 1. No CT evidence for acute intracranial abnormality. 2. Atrophy and chronic small vessel ischemic change of the white matter Electronically Signed   By: Donavan Foil M.D.   On: 07/25/2020 19:57   DG Chest Portable 1 View  Result Date: 07/25/2020 CLINICAL DATA:  Weakness EXAM: PORTABLE CHEST 1 VIEW COMPARISON:  06/19/2020 FINDINGS: Cardiac shadow is enlarged but stable. The lungs are well aerated bilaterally. Minimal left basilar atelectasis is noted. No sizable effusion or pneumothorax is noted. No bony abnormality is seen. IMPRESSION: Mild left basilar atelectasis. Electronically Signed   By: Inez Catalina M.D.   On: 07/25/2020 21:59    EKG: Independently reviewed. Sinus rhythm, LAD, IVCD, LVH.   Assessment/Plan   1. Acute encephalopathy  -  Presents with 1-2 days of progressive confusion and somnolence, is reportedly conversant but non-ambulatory at baseline per family; patient had not been complaining of anything  - No acute findings on head CT  - Check TSH, ammonia, B12, RPR, continue supportive care   2. CKD V  -  BUN is 79 and SCr is 4.25 on admission, was 89 and 3.62 on 07/02/20 - Daughter reports that patient will not be pursuing dialysis after her recent discussion with nephrologist  - Renally-dose medications, monitor    3. Hyponatremia  - Serum sodium is 125 on admission, down from 129 last month  - She was given 500 cc LR in ED, will see how she responds but appears hypervolemic and may need diuresis    4. Chronic systolic CHF  - EF was 92-11% in March 2022  - She has peripheral edema and JVD on admission but no respiratory symptoms or edema on CXR  - She has not been on beta-blocker or ACE/ARB due to hypotension   - She was given 500 cc LR in ED in light of increased SCr  - She likely needs diuresis but will hold for now and see how she responds to the fluid bolus she received   5. Dementia  - Continue Aricept  - At baseline, patient is conversant and can feed herself but is non-ambulatory and requires assistance with transfers   6. CAD  - No anginal complaints  - She has chronic LBBB  - Troponin was 9000 in March but cardiology recommended ischemic work up in light of her severe comorbid disease  - Continue ASA and statin    7. Anemia; thrombocytopenia  - Hgb is 9.3 on admission, down from 9.8 last month; no overt bleeding, likely related to CKD  - Platelets 81k on admission without apparent bleeding or infection, will check B12, monitor     DVT prophylaxis: sq heparin  Code Status: Full per patient's daughter. We discussed the concern that CPR would be futile given patient's severe cardiomyopathy and ESRD without plan for dialysis.   Level of Care: Level of care: Telemetry Medical Family Communication:  Daughter updated by phone at time of admission  Disposition Plan:  Patient is from: Home  Anticipated d/c is to: TBD Anticipated d/c date is: 07/27/20  Patient currently: Pending improvement in mental status  Consults called: None  Admission status: Observation     Vianne Bulls, MD [facility]  07/26/2020, 3:17 AM

## 2020-07-26 NOTE — Plan of Care (Signed)

## 2020-07-27 LAB — BASIC METABOLIC PANEL
Anion gap: 13 (ref 5–15)
BUN: 80 mg/dL — ABNORMAL HIGH (ref 8–23)
CO2: 23 mmol/L (ref 22–32)
Calcium: 9.3 mg/dL (ref 8.9–10.3)
Chloride: 92 mmol/L — ABNORMAL LOW (ref 98–111)
Creatinine, Ser: 4.04 mg/dL — ABNORMAL HIGH (ref 0.44–1.00)
GFR, Estimated: 11 mL/min — ABNORMAL LOW (ref >60)
Glucose, Bld: 103 mg/dL — ABNORMAL HIGH (ref 70–99)
Potassium: 4.4 mmol/L (ref 3.5–5.1)
Sodium: 128 mmol/L — ABNORMAL LOW (ref 135–145)

## 2020-07-27 LAB — CBC
HCT: 28.7 % — ABNORMAL LOW (ref 36.0–46.0)
Hemoglobin: 9.6 g/dL — ABNORMAL LOW (ref 12.0–15.0)
MCH: 33.1 pg (ref 26.0–34.0)
MCHC: 33.4 g/dL (ref 30.0–36.0)
MCV: 99 fL (ref 80.0–100.0)
Platelets: 77 10*3/uL — ABNORMAL LOW (ref 150–400)
RBC: 2.9 MIL/uL — ABNORMAL LOW (ref 3.87–5.11)
RDW: 19.9 % — ABNORMAL HIGH (ref 11.5–15.5)
WBC: 4.7 10*3/uL (ref 4.0–10.5)
nRBC: 4.3 % — ABNORMAL HIGH (ref 0.0–0.2)

## 2020-07-27 LAB — GLUCOSE, CAPILLARY
Glucose-Capillary: 93 mg/dL (ref 70–99)
Glucose-Capillary: 119 mg/dL — ABNORMAL HIGH (ref 70–99)
Glucose-Capillary: 137 mg/dL — ABNORMAL HIGH (ref 70–99)

## 2020-07-27 MED ORDER — ENSURE ENLIVE PO LIQD
237.0000 mL | Freq: Two times a day (BID) | ORAL | Status: DC
  Administered 2020-07-27 – 2020-07-28 (×3): 237 mL via ORAL

## 2020-07-27 NOTE — Progress Notes (Signed)
CSW contacted by MD that family requests patient go home with hospice support. Per report, patient already established with [facility]. CSW spoke with Chrislyn at [facility] to provide referral, she will reach out to family to order DME needed for home.  CSW to follow.  Laveda Abbe, [place] Clinical Social Worker [phone]

## 2020-07-27 NOTE — Progress Notes (Signed)
Manufacturing engineer ([facility])      Received referral for hospices services at home after discharge. Pt is a current palliative patient with [facility].  Chart under review at this time by [facility] MD.  Hospice eligibility pending.  Spoke with pt's daughter, [name], to confirm interest in hospice and to begin education on hospice services in home.  [name] verbalized understanding of information provided, but did express surprise that pt might be discharged today.  TOC made aware.  No DME needed at this time.  Pt has in home BSC, shower/tub chair, wheelchair, and an overbed table.  Daughter shares that pt has had a hospital bed in the past but does better in her own bed.  Please ensure that the signed DNR transports home with the patient.  Please order medications to ensure pt's comfort until she can be admitted onto hospice services, likely Tuesday.  Contact information given to [facility].  TOC Kathlee Nations made aware.  Thank you for the opportunity to participate in this patient's care.     Domenic Moras, BSN, RN [facility] Liaison [phone] (24h on call)

## 2020-07-27 NOTE — Plan of Care (Signed)

## 2020-07-27 NOTE — Progress Notes (Signed)
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  She lives at home with her daughter.  3 children make combined decision for her.  She has been followed by community palliative.  Recently admitted to the hospital with cardiogenic shock, troponin 9000. Seen by nephrology and decided against hemodialysis.   Assessment & Plan:   Principal Problem:   Acute encephalopathy Active Problems:   CKD (chronic kidney disease), stage V (HCC)   Chronic systolic heart failure (HCC)   Hyponatremia   Palliative care encounter   Pressure injury of skin   Encephalopathy  Acute metabolic encephalopathy in a patient with known dementia and multiple medical issues.  Her mental status fluctuates.  No focal neurological deficit.  No evidence of acute neurological changes.  All-time fall precautions.  Delirium precautions.  Goal of care discussion below.  Chronic systolic heart failure, known ejection fraction less than 20%, cardiogenic shock and recurrent symptoms: Not tolerating any advanced heart failure therapy.  Hospice recommended. Continue symptom control, additional oxygen if needed.  CKD stage V, not on dialysis: Patient is not dialysis candidate because of advanced dementia and severe systolic heart failure.  Symptomatic treatment and hospice recommendations.  Goal of care and advance care planning: Detailed discussion with patient's daughter Erin Liu and her brother at bedside multiple times. Patient has 3 children, 2 of them were available at the bedside and one of her son who lives in [place] has been in clear communication.  All of them agree on Erin Liu's plan of care and decisions. Discussed about patient's  worsening clinical status, fluctuating mental status and blood pressure which is probably due to decompensating heart failure.  Patient is also developing cardiogenic shock. Presence of her severe systolic heart failure and advanced kidney disease not being on dialysis candidate will gradually deteriorate her symptoms and ultimately may cause her to die naturally from disease in near future. Today, both of her children were agreeable and understanding the severity of the illness.  Introduced DNR/MOST form and details discussed and filled out. MOST , no CPR with limited scope of treatment, no intubation or cardioversion.  Okay for BiPAP and intravenous medications.  Signed by this provider and patient's daughter. DNR papers signed and placed in the chart. Consulted hospice to start care at home.  They will be able to admit her on 5/24. Continue supportive care and limited intervention while in the hospital, anticipate discharge home tomorrow after home hospice arrangements are in place. Patient currently comfortable with adequate hemodynamics, not needing any additional treatment.   DVT prophylaxis: SCDs Start: 07/26/20 0355   Code Status: DNR Family Communication: Patient's daughter and son at the bedside. Disposition Plan: Status is: Inpatient  Remains inpatient appropriate because:Unsafe d/c plan and Inpatient level of care appropriate due to severity of illness   Dispo: The patient is from: Home              Anticipated d/c is to: Home              Patient currently is not medically stable to d/c.   Difficult to place patient No         Consultants:   Palliative care  Procedures:   None  Antimicrobials:  None   Subjective: Patient seen and examined.  No overnight events.  Her blood pressure remained pretty stable overnight.  Patient was sleepy, family was at bedside.  Family noted patient is without any distress but just more sleepy and lethargic than usual.  Patient  tells me she is fine.  Objective: Vitals:   07/27/20 0300 07/27/20 0400 07/27/20 0831 07/27/20 1241  BP: 105/66 107/65 104/71 103/62  Pulse: 80  83 80  Resp: 20 16 19 19   Temp: 98.2 F (36.8 C)  97.6 F (36.4 C) (!) 97.4 F (36.3 C)  TempSrc: Axillary  Oral Oral  SpO2: 99%  99% 100%  Weight:      Height:        Intake/Output Summary (Last 24 hours) at 07/27/2020 1312 Last data filed at 07/27/2020 0300 Gross per 24 hour  Intake 65.49 ml  Output 150 ml  Net -84.51 ml   Filed Weights   07/25/20 1928  Weight: 77.6 kg    Examination:  General exam: Appears calm and comfortable  Sick looking.  Frail and debilitated.  Not in any distress. She does have resting tremors. Respiratory system: Clear to auscultation. Respiratory effort normal.  No added sounds. Cardiovascular system: S1 & S2 heard, RRR.  Trace bilateral pedal edema.. Gastrointestinal system: Abdomen is nondistended, soft and nontender. No organomegaly or masses felt. Normal bowel sounds heard. Central nervous system: Mostly sleepy.  Alert to name and oriented to her family and place.  Very debilitated and frail looking lady lying in bed. Extremities: Can move all extremities but generalized weakness.    Data Reviewed: I have personally reviewed following labs and imaging studies  CBC: Recent Labs  Lab 07/25/20 1939 07/26/20 0355 07/27/20 0114  WBC 4.8 4.8 4.7  NEUTROABS 3.0  --   --   HGB 9.3* 10.4* 9.6*  HCT 28.2* 31.1* 28.7*  MCV 100.4* 99.7 99.0  PLT 81* 74* 77*   Basic Metabolic Panel: Recent Labs  Lab 07/25/20 1939 07/26/20 0355 07/27/20 0114  NA 125* 127* 128*  K 4.0 4.2 4.4  CL 90* 89* 92*  CO2 21* 21* 23  GLUCOSE 122* 112* 103*  BUN 79* 80* 80*  CREATININE 4.25* 4.10* 4.04*  CALCIUM 9.6 9.7 9.3   GFR: Estimated Creatinine Clearance: 11.4 mL/min (A) (by C-G formula based on SCr of 4.04 mg/dL (H)). Liver Function Tests: Recent Labs  Lab 07/25/20 1939  AST 86*  ALT 41  ALKPHOS 95   BILITOT 1.5*  PROT 6.3*  ALBUMIN 3.1*   No results for input(s): LIPASE, AMYLASE in the last 168 hours. Recent Labs  Lab 07/26/20 0355  AMMONIA 12   Coagulation Profile: No results for input(s): INR, PROTIME in the last 168 hours. Cardiac Enzymes: No results for input(s): CKTOTAL, CKMB, CKMBINDEX, TROPONINI in the last 168 hours. BNP (last 3 results) Recent Labs    07/02/20 1147  PROBNP >4951.0*   HbA1C: No results for input(s): HGBA1C in the last 72 hours. CBG: Recent Labs  Lab 07/26/20 1732 07/26/20 1828 07/26/20 2119 07/27/20 0640 07/27/20 1244  GLUCAP 152* 164* 143* 93 119*   Lipid Profile: No results for input(s): CHOL, HDL, LDLCALC, TRIG, CHOLHDL, LDLDIRECT in the last 72 hours. Thyroid Function Tests: Recent Labs    07/26/20 0355  TSH 5.779*  FREET4 0.94   Anemia Panel: Recent Labs    07/26/20 0355  VITAMINB12 >7,500*   Sepsis Labs: No results for input(s): PROCALCITON, LATICACIDVEN in the last 168 hours.  Recent  Results (from the past 240 hour(s))  Resp Panel by RT-PCR (Flu A&B, Covid) Nasopharyngeal Swab     Status: None   Collection Time: 07/26/20  2:15 AM   Specimen: Nasopharyngeal Swab; Nasopharyngeal(NP) swabs in vial transport medium  Result Value Ref Range Status   SARS Coronavirus 2 by RT PCR NEGATIVE NEGATIVE Final    Comment: (NOTE) SARS-CoV-2 target nucleic acids are NOT DETECTED.  The SARS-CoV-2 RNA is generally detectable in upper respiratory specimens during the acute phase of infection. The lowest concentration of SARS-CoV-2 viral copies this assay can detect is 138 copies/mL. A negative result does not preclude SARS-Cov-2 infection and should not be used as the sole basis for treatment or other patient management decisions. A negative result may occur with  improper specimen collection/handling, submission of specimen other than nasopharyngeal swab, presence of viral mutation(s) within the areas targeted by this assay, and  inadequate number of viral copies(<138 copies/mL). A negative result must be combined with clinical observations, patient history, and epidemiological information. The expected result is Negative.  Fact Sheet for Patients:  EntrepreneurPulse.com.au  Fact Sheet for Healthcare Providers:  IncredibleEmployment.be  This test is no t yet approved or cleared by the [place] FDA and  has been authorized for detection and/or diagnosis of SARS-CoV-2 by FDA under an Emergency Use Authorization (EUA). This EUA will remain  in effect (meaning this test can be used) for the duration of the COVID-19 declaration under Section 564(b)(1) of the Act, 21 U.S.C.section 360bbb-3(b)(1), unless the authorization is terminated  or revoked sooner.       Influenza A by PCR NEGATIVE NEGATIVE Final   Influenza B by PCR NEGATIVE NEGATIVE Final    Comment: (NOTE) The Xpert Xpress SARS-CoV-2/FLU/RSV plus assay is intended as an aid in the diagnosis of influenza from Nasopharyngeal swab specimens and should not be used as a sole basis for treatment. Nasal washings and aspirates are unacceptable for Xpert Xpress SARS-CoV-2/FLU/RSV testing.  Fact Sheet for Patients: EntrepreneurPulse.com.au  Fact Sheet for Healthcare Providers: IncredibleEmployment.be  This test is not yet approved or cleared by the [place] FDA and has been authorized for detection and/or diagnosis of SARS-CoV-2 by FDA under an Emergency Use Authorization (EUA). This EUA will remain in effect (meaning this test can be used) for the duration of the COVID-19 declaration under Section 564(b)(1) of the Act, 21 U.S.C. section 360bbb-3(b)(1), unless the authorization is terminated or revoked.  Performed at [facility], [place] [address] [place], [place] [address]          Radiology Studies: CT Head Wo Contrast  Result Date: 07/25/2020 CLINICAL DATA:   Mental status change EXAM: CT HEAD WITHOUT CONTRAST TECHNIQUE: Contiguous axial images were obtained from the base of the skull through the vertex without intravenous contrast. COMPARISON:  CT 06/20/2020, 05/14/2020 FINDINGS: Brain: No acute territorial infarction, hemorrhage or intracranial mass. Moderate atrophy. Moderate hypodensity in the white matter consistent with chronic small vessel ischemic change. Chronic appearing lacunar infarcts in the bilateral basal ganglia and right thalamus. Stable ventricle size Vascular: No hyperdense vessels.  Carotid vascular calcification Skull: Normal. Negative for fracture or focal lesion. Sinuses/Orbits: No acute finding. Other: None IMPRESSION: 1. No CT evidence for acute intracranial abnormality. 2. Atrophy and chronic small vessel ischemic change of the white matter Electronically Signed   By: Donavan Foil M.D.   On: 07/25/2020 19:57   DG Chest Portable 1 View  Result Date: 07/25/2020 CLINICAL DATA:  Weakness EXAM: PORTABLE CHEST  1 VIEW COMPARISON:  06/19/2020 FINDINGS: Cardiac shadow is enlarged but stable. The lungs are well aerated bilaterally. Minimal left basilar atelectasis is noted. No sizable effusion or pneumothorax is noted. No bony abnormality is seen. IMPRESSION: Mild left basilar atelectasis. Electronically Signed   By: Inez Catalina M.D.   On: 07/25/2020 21:59        Scheduled Meds: . allopurinol  100 mg Oral BID  . aspirin EC  81 mg Oral Daily  . atorvastatin  40 mg Oral Daily  . donepezil  5 mg Oral QHS  . feeding supplement  237 mL Oral BID BM  . levothyroxine  25 mcg Oral Daily  . multivitamin with minerals  1 tablet Oral Daily  . vitamin B-12  2,000 mcg Oral Daily   Continuous Infusions: . sodium chloride       LOS: 1 day    Time spent: 45 minutes    Barb Merino, MD [facility] Pager [phone]

## 2020-07-27 NOTE — Evaluation (Signed)
Occupational Therapy Evaluation Patient Details Name: Erin Liu MRN: [id number] DOB: 03-Jul-1940 Today's Date: 07/27/2020    History of Present Illness 80 y/o female presented to ED on 5/20 with AMS. Chest x-ray with mild L basilar atelectasis. Head CT negative for acute intracranial abnormality. PMH: CKD, cardiomyopathy with EF 20-25%, anemia, dementia, HTN, HLD, peripheral neuropathy.   Clinical Impression   PTA patient was living with her son in a private residence and was beginning to require up to Max/Total A +2 for functional transfers and ADLs. Patient continues to require Max A to Total A +2 for bed mobility and hygiene management at bed level. Family present at bedside report steady decline in patient function over last several months. Goal after this admission is for home with hospice. OT provided family education on use of hoyer lift to decrease caregiver burden and maximize safety with functional transfers. Patient does not require continued acute occupational therapy services with OT to sign off at this time.    Follow Up Recommendations  No OT follow up;Supervision/Assistance - 24 hour    Equipment Recommendations  Wheelchair (measurements OT);Wheelchair cushion (measurements OT);Hospital bed;Other (comment) (hoyer lift, lift pad)    Recommendations for Other Services Other (comment) (Palliative/hospice)     Precautions / Restrictions Precautions Precautions: Fall Restrictions Weight Bearing Restrictions: No      Mobility Bed Mobility Overal bed mobility: Needs Assistance Bed Mobility: Supine to Sit;Sit to Supine     Supine to sit: Total assist Sit to supine: Total assist   General bed mobility comments: Total A for all aspects of bed mobility. No initiation by patient.    Transfers                 General transfer comment: deferred.    Balance Overall balance assessment: Needs assistance Sitting-balance support: No upper extremity  supported;Feet unsupported Sitting balance-Leahy Scale: Poor Sitting balance - Comments: Max A at most and Mod A at best to maintain static sitting balance at EOB.                                   ADL either performed or assessed with clinical judgement   ADL Overall ADL's : Needs assistance/impaired     Grooming: Total assistance;Sitting Grooming Details (indicate cue type and reason): Patient completes less than 25% of face washing seated EOB with multimodal cues for initiation and increased time.             Lower Body Dressing: Total assistance;Bed level Lower Body Dressing Details (indicate cue type and reason): Total A to don footwear at bed level.                     Vision         Perception     Praxis      Pertinent Vitals/Pain Pain Assessment: Faces Faces Pain Scale: No hurt Pain Intervention(s): Monitored during session     Hand Dominance Right   Extremity/Trunk Assessment Upper Extremity Assessment Upper Extremity Assessment: Generalized weakness (Bilateral UE tremors noted.)   Lower Extremity Assessment Lower Extremity Assessment: Defer to PT evaluation   Cervical / Trunk Assessment Cervical / Trunk Assessment: Kyphotic   Communication Communication Communication: Other (comment) (Perseverative in speech)   Cognition Arousal/Alertness: Lethargic;Awake/alert Behavior During Therapy: Flat affect Overall Cognitive Status: History of cognitive impairments - at baseline  General Comments: Patient somnolent throughout evaluation. Keeps eyes closed unless cued to keep open. Daughter and son present at bedside report this is baseline.   General Comments  Daughter and son seated at bedside. Patient responds to daughters voice.    Exercises     Shoulder Instructions      Home Living Family/patient expects to be discharged to:: Private residence Living Arrangements: Children;Other  (Comment) (lives with son. Daughter helps on Monday and Tuesday) Available Help at Discharge: Family;Available 24 hours/day Type of Home: House       Home Layout: One level     Bathroom Shower/Tub: Tub/shower unit;Curtain   Biochemist, clinical: Standard     Home Equipment: Environmental consultant - 2 wheels;Cane - quad;Shower seat;Grab bars - tub/shower;Wheelchair - manual          Prior Functioning/Environment Level of Independence: Needs assistance  Gait / Transfers Assistance Needed: 2-3 days PTA patient required maxA ti totalA +2 for transfers. Not ambulatory. Family states patient has had steady decline over several months. ADL's / Homemaking Assistance Needed: requires assistance for all ADLs including self-feeding Communication / Swallowing Assistance Needed: Daughter reports patient typically oriented to person only.          OT Problem List: Decreased strength;Decreased range of motion;Decreased activity tolerance;Impaired balance (sitting and/or standing);Decreased coordination;Decreased cognition      OT Treatment/Interventions:      OT Goals(Current goals can be found in the care plan section) Acute Rehab OT Goals Patient Stated Goal: Per family present at bedside, for patient to return home with hospice OT Goal Formulation: With family Time For Goal Achievement: 08/10/20 Potential to Achieve Goals: Good  OT Frequency:     Barriers to D/C:            Co-evaluation              AM-PAC OT "6 Clicks" Daily Activity     Outcome Measure Help from another person eating meals?: Total Help from another person taking care of personal grooming?: Total Help from another person toileting, which includes using toliet, bedpan, or urinal?: Total Help from another person bathing (including washing, rinsing, drying)?: Total Help from another person to put on and taking off regular upper body clothing?: Total Help from another person to put on and taking off regular lower body  clothing?: Total 6 Click Score: 6   End of Session    Activity Tolerance: Patient limited by lethargy Patient left: in bed;with call bell/phone within reach;with bed alarm set;with family/visitor present  OT Visit Diagnosis: Muscle weakness (generalized) (M62.81)                Time: 9242-6834 OT Time Calculation (min): 22 min Charges:  OT General Charges $OT Visit: 1 Visit OT Evaluation $OT Eval Moderate Complexity: 1 Mod  Margaretha Mahan H. OTR/L Supplemental OT, Department of rehab services [phone]  Josephmichael Lisenbee R H. 07/27/2020, 1:09 PM

## 2020-07-28 LAB — BASIC METABOLIC PANEL
Anion gap: 9 (ref 5–15)
BUN: 81 mg/dL — ABNORMAL HIGH (ref 8–23)
CO2: 23 mmol/L (ref 22–32)
Calcium: 9.1 mg/dL (ref 8.9–10.3)
Chloride: 96 mmol/L — ABNORMAL LOW (ref 98–111)
Creatinine, Ser: 3.94 mg/dL — ABNORMAL HIGH (ref 0.44–1.00)
GFR, Estimated: 11 mL/min — ABNORMAL LOW (ref >60)
Glucose, Bld: 97 mg/dL (ref 70–99)
Potassium: 4.9 mmol/L (ref 3.5–5.1)
Sodium: 128 mmol/L — ABNORMAL LOW (ref 135–145)

## 2020-07-28 LAB — CBC
HCT: 29.4 % — ABNORMAL LOW (ref 36.0–46.0)
Hemoglobin: 10.1 g/dL — ABNORMAL LOW (ref 12.0–15.0)
MCH: 33.4 pg (ref 26.0–34.0)
MCHC: 34.4 g/dL (ref 30.0–36.0)
MCV: 97.4 fL (ref 80.0–100.0)
Platelets: 67 10*3/uL — ABNORMAL LOW (ref 150–400)
RBC: 3.02 MIL/uL — ABNORMAL LOW (ref 3.87–5.11)
RDW: 20.1 % — ABNORMAL HIGH (ref 11.5–15.5)
WBC: 5.1 10*3/uL (ref 4.0–10.5)
nRBC: 2.4 % — ABNORMAL HIGH (ref 0.0–0.2)

## 2020-07-28 LAB — PATHOLOGIST SMEAR REVIEW

## 2020-07-28 MED ORDER — TORSEMIDE 40 MG PO TABS: 40.0000 mg | ORAL_TABLET | Freq: Every day | ORAL | Status: AC

## 2020-07-28 MED ORDER — HYOSCYAMINE SULFATE 0.125 MG SL SUBL: 0.1250 mg | SUBLINGUAL_TABLET | SUBLINGUAL | 0 refills | Status: AC | PRN

## 2020-07-28 MED ORDER — LORAZEPAM 0.5 MG PO TABS: 0.5000 mg | ORAL_TABLET | ORAL | 0 refills | Status: AC | PRN

## 2020-07-28 MED ORDER — MORPHINE SULFATE (CONCENTRATE) 20 MG/ML PO SOLN: 10.0000 mg | ORAL | 0 refills | Status: AC | PRN

## 2020-07-28 NOTE — Progress Notes (Signed)
PT Cancellation Note  Patient Details Name: Erin Liu MRN: [id number] DOB: November 07, 1940   Cancelled Treatment:    Reason Eval/Treat Not Completed: Other (comment) Patient to discharge home with hospice today. No further skilled PT needs required acutely. PT will sign off.   Cait Locust A. Gilford Rile PT, DPT Acute Rehabilitation Services Pager [phone] Office [phone]    Linna Hoff 07/28/2020, 11:17 AM

## 2020-07-28 NOTE — Consult Note (Signed)
   [facility] Inpatient Consult   07/28/2020  AERILYN SLEE 03/26/1940 [id number]  [facility] [ACO] Patient: [facility] Medicare   Patient screened for hospitalization with noted high risk score for unplanned readmission risk and to assess for potential [place] Management service needs for post hospital transition.  Patient is in an Embedded Chronic Care Management program.   Review of patient's medical record reveals patient is for home with hospice.   Plan:  No [facility] community follow up needs assessed as patient will be managed by home hospice care with [employer] noted.  Will sign off at disposition from the hospital. Will alert [facility] of disposition, as well.  For questions contact:   Natividad Brood, RN BSN [place] Liaison  [phone] business mobile phone Toll free office [phone]  Fax number: [phone] [email or web address]

## 2020-07-28 NOTE — TOC Transition Note (Signed)
Transition of Care [facility]) - CM/SW Discharge Note   Patient Details  Name: ADAYA GARMANY MRN: [id number] Date of Birth: [birth year]  [facility]) CM/SW Contact:  Geralynn Ochs, LCSW Phone Number: 07/28/2020, 10:47 AM   Clinical Narrative:   Patient discharging home with hospice today. CSW spoke with [name] to confirm need for PTAR home, arranged for 12:30 pm today. CSW alerted [facility] that patient will be home today, they plan to admit to hospice tomorrow. No other needs at this time.    Final next level of care: Home w Hospice Care Barriers to Discharge: Barriers Resolved   Patient Goals and CMS Choice Patient states their goals for this hospitalization and ongoing recovery are:: patient unable to participate in goal setting CMS Medicare.gov Compare Post Acute Care list provided to:: Patient Represenative (must comment) Choice offered to / list presented to : Adult Children  Discharge Placement                Patient to be transferred to facility by: [facility] Name of family member notified: [name] Patient and family notified of of transfer: 07/28/20  Discharge Plan and Services                                     Social Determinants of Health (SDOH) Interventions     Readmission Risk Interventions No flowsheet data found.

## 2020-07-28 NOTE — Consult Note (Signed)
WOC Nurse Consult Note: Reason for Consult: scabbed lesions to right heel, right dorsal foot present on admission. Daughter reports she has been swelling lately and attributes skin breakdown to a tight slipper she was wearing. Patient is currently being fed by staff at bedside and daughter states this is a recent change.   Wound type: pressure, likely from a shoe due to swelling.  Although patient is at high risk of further breakdown due to poor bed mobility, poor intact and decline in health.  Daughter is aware and will make sure that patient is turned regularly at home. Will insure that patient is assisted with meals. She indicates that patient already has heel offloading boots at home as well.  Pressure Injury POA: Yes Measurement: Right dorsal foot:  1 cm scabbed lesion Right heel:  2 cmx 2 cm deep tissue injury, intact maroon discoloration Left heel with nonblanchable erythema Scarring to right thigh noted. Daughter thinks she had stitches there at one time.  Skin Is intact.  Heels are floated in bed with a pillow beneath her calf.   Wound bed:see above Drainage (amount, consistency, odor) none noted Periwound:dry skin to legs and feet Dressing procedure/placement/frequency: Foam dressings to right foot wound and bilateral heels.  Float heels while in bed with pillow.  Patient has offloading boots at home and is discharging home today.  Will not follow at this time.  Please re-consult if needed.  Domenic Moras MSN, RN, FNP-BC [facility] Wound, Ostomy, Continence Nurse Pager [phone]

## 2020-07-28 NOTE — Plan of Care (Signed)
  Problem: Education: Goal: Knowledge of General Education information will improve Description: Including pain rating scale, medication(s)/side effects and non-pharmacologic comfort measures Outcome: Adequate for Discharge   

## 2020-07-28 NOTE — Progress Notes (Signed)
Discharge instructions (including medications) discussed with and copy provided to patient/caregiver 

## 2020-07-28 NOTE — Discharge Summary (Signed)
Physician Discharge Summary  Erin Liu LZJ:[id number] DOB: [birth year] DOA: 07/25/2020  PCP: Binnie Rail, MD  Admit date: 07/25/2020 Discharge date: 07/28/2020  Admitted From: Home Disposition: Home with home hospice  Recommendations for Outpatient Follow-up:  1. 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 She lives at home with her daughter. 3 children make combined decision for her. She has been followed by community palliative. Recently admitted to the hospital with cardiogenic shock, troponin 9000. Seen by nephrology and decided against hemodialysis.  Patient was admitted to hospital.  Her further clinical plan is discussed as below:  - Acute metabolic encephalopathy in a patient with known dementia and multiple medical issues.  Her mental status fluctuates.  No focal neurological deficit.  No evidence of acute neurological changes.  All-time fall precautions.  Delirium precautions.  Goal of care discussion below.  - Chronic systolic heart failure, known ejection fraction less than 20%, cardiogenic shock and recurrent symptoms: Not tolerating any advanced heart failure therapy.  Hospice recommended. Continue symptom control, additional oxygen if needed. Patient currently remains adequately stable.  Mental status is stable.  She is on aspirin, statin that she will continue.  She is on torsemide 40 mg twice a day, this will help symptom control not accounting for renal functions and blood pressure.  We will continue torsemide once a day.  - CKD stage V, not on dialysis: Patient is not dialysis candidate  because of advanced dementia and severe systolic heart failure.  Symptomatic treatment and hospice recommendations. Continue diuretics for symptom control.  300 mL urine in last 24 hours.  No evidence of uremia, however progressive oliguria.  - Goal of care and advance care planning: Detailed discussion with patient's daughter [name] and her brother at bedside. Discussed about patient's worsening clinical status, fluctuating mental status and blood pressure which is probably due to decompensating heart failure.  Patient is also developing intermittent cardiogenic shock. Presence of her severe systolic heart failure and advanced kidney disease not being on dialysis candidate will gradually deteriorate her symptoms and ultimately may cause her to die naturally from disease in near future. MOST , no CPR with limited scope of treatment, no intubation or cardioversion.  Okay for BiPAP and intravenous medications.  Signed by this provider and patient's daughter. DNR papers signed and placed in the chart. Patient currently comfortable with adequate hemodynamics, not needing any additional treatment. Going home today, hospice will admit her tomorrow.  She has adequate equipments at home. Patient was prescribed medications for comfort and anxiety including concentrated morphine solution and Ativan.  She is not needing those medications today. Further care will be guided by hospice at home.   Discharge Diagnoses:  Principal Problem:   Acute encephalopathy Active Problems:   CKD (chronic kidney disease), stage V (HCC)   Chronic systolic heart failure (HCC)   Hyponatremia   Palliative care encounter   Pressure injury of skin   Encephalopathy    Discharge Instructions  Discharge Instructions    Diet - low sodium heart healthy   Complete by: As directed    Discharge wound care:   Complete by: As directed    Foam dressings to right foot wound  and bilateral heels.  Change every three days and  PRN soilage.  Float heels while in bed with pillow.  Patient has offloading boots at home and is discharging home today.   Increase activity slowly   Complete by: As directed      Allergies as of 07/28/2020      Reactions   Lisinopril Cough      Medication List    TAKE these medications   acetaminophen 500 MG tablet Commonly known as: TYLENOL Take 500 mg by mouth 2 (two) times daily.   allopurinol 100 MG tablet Commonly known as: ZYLOPRIM TAKE 2 TABLETS BY MOUTH DAILY What changed:   how much to take  when to take this   aspirin 81 MG EC tablet Take 1 tablet (81 mg total) by mouth daily. Swallow whole.   atorvastatin 40 MG tablet Commonly known as: LIPITOR TAKE 1 TABLET BY MOUTH DAILY   donepezil 5 MG tablet Commonly known as: ARICEPT Take 5 mg by mouth at bedtime.   hyoscyamine 0.125 MG SL tablet Commonly known as: LEVSIN SL Place 1 tablet (0.125 mg total) under the tongue every 4 (four) hours as needed (excess oral secretions).   levothyroxine 25 MCG tablet Commonly known as: SYNTHROID Take 1 tablet (25 mcg total) by mouth daily.   LORazepam 0.5 MG tablet Commonly known as: ATIVAN Take 1 tablet (0.5 mg total) by mouth every 4 (four) hours as needed for anxiety. May crush, mix with water and give sublingually if needed.   morphine 20 MG/ML concentrated solution Commonly known as: ROXANOL Take 0.5 mLs (10 mg total) by mouth every 4 (four) hours as needed for severe pain or shortness of breath. May give sublingually if needed.   multivitamin-iron-minerals-folic acid chewable tablet Chew 1 tablet by mouth daily.   Torsemide 40 MG Tabs Take 40 mg by mouth daily. What changed: when to take this   Vitamin B-12 2000 MCG Tbcr Take 1 tablet (2,000 mcg total) by mouth daily.            Discharge Care Instructions  (From admission, onward)         Start     Ordered   07/28/20 0000  Discharge wound care:       Comments: Foam dressings to right foot wound  and bilateral heels.  Change every three days and PRN soilage.  Float heels while in bed with pillow.  Patient has offloading boots at home and is discharging home today.   07/28/20 0957          Allergies  Allergen Reactions  . Lisinopril Cough    Consultations:  Palliative and hospice   Procedures/Studies: CT Head Wo Contrast  Result Date: 07/25/2020 CLINICAL DATA:  Mental status change EXAM: CT HEAD WITHOUT CONTRAST TECHNIQUE: Contiguous axial images were obtained from the base of the skull through the vertex without intravenous contrast. COMPARISON:  CT 06/20/2020, 05/14/2020 FINDINGS: Brain: No acute territorial infarction, hemorrhage or intracranial mass. Moderate atrophy. Moderate hypodensity in the white matter consistent with chronic small vessel ischemic change. Chronic appearing lacunar infarcts in the bilateral basal ganglia and right thalamus. Stable ventricle size Vascular: No hyperdense vessels.  Carotid vascular calcification Skull: Normal. Negative for fracture or focal lesion. Sinuses/Orbits: No acute finding. Other: None IMPRESSION: 1. No CT evidence for acute intracranial abnormality. 2. Atrophy and chronic small vessel ischemic change of the white matter Electronically Signed   By: Donavan Foil M.D.   On: 07/25/2020 19:57  DG Chest Portable 1 View  Result Date: 07/25/2020 CLINICAL DATA:  Weakness EXAM: PORTABLE CHEST 1 VIEW COMPARISON:  06/19/2020 FINDINGS: Cardiac shadow is enlarged but stable. The lungs are well aerated bilaterally. Minimal left basilar atelectasis is noted. No sizable effusion or pneumothorax is noted. No bony abnormality is seen. IMPRESSION: Mild left basilar atelectasis. Electronically Signed   By: Inez Catalina M.D.   On: 07/25/2020 21:59   (Echo, Carotid, EGD, Colonoscopy, ERCP)    Subjective: Patient seen and examined.  Was being fed by nurse tech.  Denies any complaints.  Looks comfortable.  Daughter at the bedside.  Family thinks she is at  baseline with her participation and lethargic.  No other overnight events. 300 mL urine noted last 24 hours.  She was also incontinent.   Discharge Exam: Vitals:   07/28/20 0545 07/28/20 0745  BP: 102/70 106/71  Pulse:  80  Resp:  20  Temp:  97.6 F (36.4 C)  SpO2:  100%   Vitals:   07/28/20 0241 07/28/20 0501 07/28/20 0545 07/28/20 0745  BP: 93/65 (!) 139/127 102/70 106/71  Pulse:  79  80  Resp: 18 20  20   Temp:  (!) 97.4 F (36.3 C)  97.6 F (36.4 C)  TempSrc:  Oral  Oral  SpO2:  98%  100%  Weight:      Height:        General: Pt is alert, awake, not in acute distress Frail and debilitated.  Chronically sick looking.  Not in any distress.  On room air. Patient is alert and awake but not oriented.  She is mostly sleepy with eyes closed, responds to interview and states she is fine. Cardiovascular: RRR, S1/S2 +, no rubs, no gallops Respiratory: CTA bilaterally, no wheezing, no rhonchi Abdominal: Soft, NT, ND, bowel sounds + Extremities: no edema, no cyanosis    The results of significant diagnostics from this hospitalization (including imaging, microbiology, ancillary and laboratory) are listed below for reference.     Microbiology: Recent Results (from the past 240 hour(s))  Resp Panel by RT-PCR (Flu A&B, Covid) Nasopharyngeal Swab     Status: None   Collection Time: 07/26/20  2:15 AM   Specimen: Nasopharyngeal Swab; Nasopharyngeal(NP) swabs in vial transport medium  Result Value Ref Range Status   SARS Coronavirus 2 by RT PCR NEGATIVE NEGATIVE Final    Comment: (NOTE) SARS-CoV-2 target nucleic acids are NOT DETECTED.  The SARS-CoV-2 RNA is generally detectable in upper respiratory specimens during the acute phase of infection. The lowest concentration of SARS-CoV-2 viral copies this assay can detect is 138 copies/mL. A negative result does not preclude SARS-Cov-2 infection and should not be used as the sole basis for treatment or other patient management  decisions. A negative result may occur with  improper specimen collection/handling, submission of specimen other than nasopharyngeal swab, presence of viral mutation(s) within the areas targeted by this assay, and inadequate number of viral copies(<138 copies/mL). A negative result must be combined with clinical observations, patient history, and epidemiological information. The expected result is Negative.  Fact Sheet for Patients:  EntrepreneurPulse.com.au  Fact Sheet for Healthcare Providers:  IncredibleEmployment.be  This test is no t yet approved or cleared by the [place] FDA and  has been authorized for detection and/or diagnosis of SARS-CoV-2 by FDA under an Emergency Use Authorization (EUA). This EUA will remain  in effect (meaning this test can be used) for the duration of the COVID-19 declaration under Section 564(b)(1) of the Act,  21 U.S.C.section 360bbb-3(b)(1), unless the authorization is terminated  or revoked sooner.       Influenza A by PCR NEGATIVE NEGATIVE Final   Influenza B by PCR NEGATIVE NEGATIVE Final    Comment: (NOTE) The Xpert Xpress SARS-CoV-2/FLU/RSV plus assay is intended as an aid in the diagnosis of influenza from Nasopharyngeal swab specimens and should not be used as a sole basis for treatment. Nasal washings and aspirates are unacceptable for Xpert Xpress SARS-CoV-2/FLU/RSV testing.  Fact Sheet for Patients: EntrepreneurPulse.com.au  Fact Sheet for Healthcare Providers: IncredibleEmployment.be  This test is not yet approved or cleared by the [place] FDA and has been authorized for detection and/or diagnosis of SARS-CoV-2 by FDA under an Emergency Use Authorization (EUA). This EUA will remain in effect (meaning this test can be used) for the duration of the COVID-19 declaration under Section 564(b)(1) of the Act, 21 U.S.C. section 360bbb-3(b)(1), unless the  authorization is terminated or revoked.  Performed at [facility], [place] [address], [place], [place] [address]      Labs: BNP (last 3 results) No results for input(s): BNP in the last 8760 hours. Basic Metabolic Panel: Recent Labs  Lab 07/25/20 1939 07/26/20 0355 07/27/20 0114 07/28/20 0454  NA 125* 127* 128* 128*  K 4.0 4.2 4.4 4.9  CL 90* 89* 92* 96*  CO2 21* 21* 23 23  GLUCOSE 122* 112* 103* 97  BUN 79* 80* 80* 81*  CREATININE 4.25* 4.10* 4.04* 3.94*  CALCIUM 9.6 9.7 9.3 9.1   Liver Function Tests: Recent Labs  Lab 07/25/20 1939  AST 86*  ALT 41  ALKPHOS 95  BILITOT 1.5*  PROT 6.3*  ALBUMIN 3.1*   No results for input(s): LIPASE, AMYLASE in the last 168 hours. Recent Labs  Lab 07/26/20 0355  AMMONIA 12   CBC: Recent Labs  Lab 07/25/20 1939 07/26/20 0355 07/27/20 0114 07/28/20 0454  WBC 4.8 4.8 4.7 5.1  NEUTROABS 3.0  --   --   --   HGB 9.3* 10.4* 9.6* 10.1*  HCT 28.2* 31.1* 28.7* 29.4*  MCV 100.4* 99.7 99.0 97.4  PLT 81* 74* 77* 67*   Cardiac Enzymes: No results for input(s): CKTOTAL, CKMB, CKMBINDEX, TROPONINI in the last 168 hours. BNP: Invalid input(s): POCBNP CBG: Recent Labs  Lab 07/26/20 1828 07/26/20 2119 07/27/20 0640 07/27/20 1244 07/27/20 1553  GLUCAP 164* 143* 93 119* 137*   D-Dimer No results for input(s): DDIMER in the last 72 hours. Hgb A1c No results for input(s): HGBA1C in the last 72 hours. Lipid Profile No results for input(s): CHOL, HDL, LDLCALC, TRIG, CHOLHDL, LDLDIRECT in the last 72 hours. Thyroid function studies Recent Labs    07/26/20 0355  TSH 5.779*   Anemia work up Recent Labs    07/26/20 0355  VITAMINB12 >7,500*   Urinalysis    Component Value Date/Time   COLORURINE YELLOW 07/17/2020 0940   APPEARANCEUR CLEAR 07/17/2020 0940   LABSPEC 1.015 07/17/2020 0940   PHURINE 5.0 07/17/2020 0940   GLUCOSEU NEGATIVE 07/17/2020 0940   HGBUR LARGE (A) 07/17/2020 0940   HGBUR moderate 06/16/2009  0928   BILIRUBINUR NEGATIVE 07/17/2020 0940   KETONESUR NEGATIVE 07/17/2020 0940   PROTEINUR NEGATIVE 06/20/2020 0955   UROBILINOGEN 0.2 07/17/2020 0940   NITRITE NEGATIVE 07/17/2020 0940   LEUKOCYTESUR MODERATE (A) 07/17/2020 0940   Sepsis Labs Invalid input(s): PROCALCITONIN,  WBC,  LACTICIDVEN Microbiology Recent Results (from the past 240 hour(s))  Resp Panel by RT-PCR (Flu A&B, Covid) Nasopharyngeal Swab  Status: None   Collection Time: 07/26/20  2:15 AM   Specimen: Nasopharyngeal Swab; Nasopharyngeal(NP) swabs in vial transport medium  Result Value Ref Range Status   SARS Coronavirus 2 by RT PCR NEGATIVE NEGATIVE Final    Comment: (NOTE) SARS-CoV-2 target nucleic acids are NOT DETECTED.  The SARS-CoV-2 RNA is generally detectable in upper respiratory specimens during the acute phase of infection. The lowest concentration of SARS-CoV-2 viral copies this assay can detect is 138 copies/mL. A negative result does not preclude SARS-Cov-2 infection and should not be used as the sole basis for treatment or other patient management decisions. A negative result may occur with  improper specimen collection/handling, submission of specimen other than nasopharyngeal swab, presence of viral mutation(s) within the areas targeted by this assay, and inadequate number of viral copies(<138 copies/mL). A negative result must be combined with clinical observations, patient history, and epidemiological information. The expected result is Negative.  Fact Sheet for Patients:  EntrepreneurPulse.com.au  Fact Sheet for Healthcare Providers:  IncredibleEmployment.be  This test is no t yet approved or cleared by the [place] FDA and  has been authorized for detection and/or diagnosis of SARS-CoV-2 by FDA under an Emergency Use Authorization (EUA). This EUA will remain  in effect (meaning this test can be used) for the duration of the COVID-19  declaration under Section 564(b)(1) of the Act, 21 U.S.C.section 360bbb-3(b)(1), unless the authorization is terminated  or revoked sooner.       Influenza A by PCR NEGATIVE NEGATIVE Final   Influenza B by PCR NEGATIVE NEGATIVE Final    Comment: (NOTE) The Xpert Xpress SARS-CoV-2/FLU/RSV plus assay is intended as an aid in the diagnosis of influenza from Nasopharyngeal swab specimens and should not be used as a sole basis for treatment. Nasal washings and aspirates are unacceptable for Xpert Xpress SARS-CoV-2/FLU/RSV testing.  Fact Sheet for Patients: EntrepreneurPulse.com.au  Fact Sheet for Healthcare Providers: IncredibleEmployment.be  This test is not yet approved or cleared by the [place] FDA and has been authorized for detection and/or diagnosis of SARS-CoV-2 by FDA under an Emergency Use Authorization (EUA). This EUA will remain in effect (meaning this test can be used) for the duration of the COVID-19 declaration under Section 564(b)(1) of the Act, 21 U.S.C. section 360bbb-3(b)(1), unless the authorization is terminated or revoked.  Performed at [facility], [place] [address] [place], [place] [address]      Time coordinating discharge:  45 minutes  SIGNED:   Barb Merino, MD  [facility] 07/28/2020, 10:25 AM

## 2020-07-30 ENCOUNTER — Telehealth: Payer: Self-pay

## 2020-07-30 ENCOUNTER — Ambulatory Visit: Payer: Medicare HMO | Admitting: Internal Medicine

## 2020-07-30 NOTE — Telephone Encounter (Signed)
Transition Care Management Follow-up Telephone Call  Date of discharge and from where: 07/28/2020 from [facility]  How have you been since you were released from the hospital? Doing good per daughter, [name].  Any questions or concerns? No  Items Reviewed:  Did the pt receive and understand the discharge instructions provided? Yes   Medications obtained and verified? Yes   Other? No   Any new allergies since your discharge? No   Dietary orders reviewed? Yes, low sodium heart healthy diet  Do you have support at home? Yes , daughter [name])  [place] and Equipment/Supplies: Were home health services ordered? Yes, [place] If so, what is the name of the agency? [facility]  Has the agency set up a time to come to the patient's home? Yes, 07/30/2020 Were any new equipment or medical supplies ordered?  No What is the name of the medical supply agency? n/a Were you able to get the supplies/equipment? not applicable Do you have any questions related to the use of the equipment or supplies? No  Functional Questionnaire: (I = Independent and D = Dependent) ADLs: D  Bathing/Dressing- D  Meal Prep- D  Eating- D  Maintaining continence- D  Transferring/Ambulation- D  Managing Meds- D  Follow up appointments reviewed:   PCP Hospital f/u appt confirmed? No    Specialist Hospital f/u appt confirmed? No    Are transportation arrangements needed? No   If their condition worsens, is the pt aware to call PCP or go to the Emergency Dept.? Yes  Was the patient provided with contact information for the PCP's office or ED? Yes  Was to pt encouraged to call back with questions or concerns? Yes

## 2020-08-01 ENCOUNTER — Telehealth: Payer: Self-pay | Admitting: *Deleted

## 2020-08-01 ENCOUNTER — Encounter: Payer: Self-pay | Admitting: *Deleted

## 2020-08-01 ENCOUNTER — Ambulatory Visit: Payer: Medicare HMO | Admitting: *Deleted

## 2020-08-01 DIAGNOSIS — D539 Nutritional anemia, unspecified: Secondary | ICD-10-CM | POA: Diagnosis not present

## 2020-08-01 DIAGNOSIS — I5022 Chronic systolic (congestive) heart failure: Secondary | ICD-10-CM | POA: Diagnosis not present

## 2020-08-01 DIAGNOSIS — E1329 Other specified diabetes mellitus with other diabetic kidney complication: Secondary | ICD-10-CM | POA: Diagnosis not present

## 2020-08-01 DIAGNOSIS — N184 Chronic kidney disease, stage 4 (severe): Secondary | ICD-10-CM | POA: Diagnosis not present

## 2020-08-01 DIAGNOSIS — E785 Hyperlipidemia, unspecified: Secondary | ICD-10-CM | POA: Diagnosis not present

## 2020-08-01 DIAGNOSIS — E1365 Other specified diabetes mellitus with hyperglycemia: Secondary | ICD-10-CM | POA: Diagnosis not present

## 2020-08-01 DIAGNOSIS — R413 Other amnesia: Secondary | ICD-10-CM

## 2020-08-01 NOTE — Telephone Encounter (Signed)
  Care Management   Follow Up Note   08/01/2020 Name: Erin Liu MRN: [id number] DOB: 1940/05/07   Referred by: Binnie Rail, MD Reason for referral : No chief complaint on file.  An unsuccessful telephone outreach was attempted today. The patient was referred to the case management team for assistance with care management and care coordination.   Call placed today to complete previously scheduled follow up telephone appointment with caregiver [name], on [place]  Follow Up Plan:   A HIPPA compliant phone message was left for the patient providing contact information and requesting a return call.   Will request that care guide contact patient's caregiver to re-schedule today's missed telephone appointment if I do not hear back from caregiver  Oneta Rack, RN, BSN, [place] [phone]: direct office [phone]: mobile

## 2020-08-01 NOTE — Chronic Care Management (AMB) (Signed)
Chronic Care Management   CCM RN Visit Note  08/01/2020 Name: Erin Liu MRN: [id number] DOB: 1941/01/23  Subjective: Erin Liu is a 80 y.o. year old female who is a primary care patient of Burns, Claudina Lick, MD. The care management team was consulted for assistance with disease management and care coordination needs.    Engaged with patient's caregiver Erin Liu, on [facility] by telephone for follow up visit in response to provider referral for case management and/or care coordination services.   Consent to Services:  The patient was given information about Chronic Care Management services, agreed to services, and gave verbal consent prior to initiation of services.  Please see initial visit note for detailed documentation.  Patient agreed to services and verbal consent obtained.   Case Closure/ change in enrollment status: 08/01/20: Goals completed; caregiver/ daughter Erin Liu on [place] confirms that patient is under Hospice care at home   Assessment: Review of patient past medical history, allergies, medications, health status, including review of consultants reports, laboratory and other test data, was performed as part of comprehensive evaluation and provision of chronic care management services.   CCM Care Plan  Allergies  Allergen Reactions  . Lisinopril Cough    Outpatient Encounter Medications as of 08/01/2020  Medication Sig  . acetaminophen (TYLENOL) 500 MG tablet Take 500 mg by mouth 2 (two) times daily.  Marland Kitchen allopurinol (ZYLOPRIM) 100 MG tablet TAKE 2 TABLETS BY MOUTH DAILY (Patient taking differently: Take 100 mg by mouth 2 (two) times daily.)  . aspirin EC 81 MG EC tablet Take 1 tablet (81 mg total) by mouth daily. Swallow whole.  Marland Kitchen atorvastatin (LIPITOR) 40 MG tablet TAKE 1 TABLET BY MOUTH DAILY (Patient taking differently: Take 40 mg by mouth daily.)  . Cyanocobalamin (VITAMIN B-12) 2000 MCG TBCR Take 1 tablet (2,000 mcg total) by mouth daily.  Marland Kitchen  donepezil (ARICEPT) 5 MG tablet Take 5 mg by mouth at bedtime.  . hyoscyamine (LEVSIN SL) 0.125 MG SL tablet Place 1 tablet (0.125 mg total) under the tongue every 4 (four) hours as needed (excess oral secretions).  Marland Kitchen levothyroxine (SYNTHROID) 25 MCG tablet Take 1 tablet (25 mcg total) by mouth daily.  Marland Kitchen LORazepam (ATIVAN) 0.5 MG tablet Take 1 tablet (0.5 mg total) by mouth every 4 (four) hours as needed for anxiety. May crush, mix with water and give sublingually if needed.  Marland Kitchen morphine (ROXANOL) 20 MG/ML concentrated solution Take 0.5 mLs (10 mg total) by mouth every 4 (four) hours as needed for severe pain or shortness of breath. May give sublingually if needed.  . multivitamin-iron-minerals-folic acid (CENTRUM) chewable tablet Chew 1 tablet by mouth daily.  . Torsemide 40 MG TABS Take 40 mg by mouth daily.   No facility-administered encounter medications on file as of 08/01/2020.    Patient Active Problem List   Diagnosis Date Noted  . Acute encephalopathy 07/26/2020  . Hyponatremia 07/26/2020  . Pressure injury of skin 07/26/2020  . Encephalopathy 07/26/2020  . Palliative care encounter   . Gross hematuria 07/15/2020  . Severe mitral regurgitation 07/02/2020  . Severe tricuspid regurgitation 07/02/2020  . Chronic systolic heart failure ([place]) 07/01/2020  . Recurrent UTI 07/01/2020  . Non-ST elevation (NSTEMI) myocardial infarction ([place])   . CAP (community acquired pneumonia) 05/15/2020  . Acute metabolic encephalopathy 95/62/1308  . Fall at home, initial encounter 05/15/2020  . Prolonged QT interval 05/15/2020  . Demand ischemia ([place])   . Recurrent falls 04/28/2020  . Weight  loss, unintentional 09/11/2019  . Memory disorder 07/18/2019  . Physical deconditioning 01/13/2018  . Tremor of both hands 10/10/2017  . Urinary incontinence 10/10/2017  . Vasovagal syncope 10/02/2017  . Gout 07/12/2017  . B12 deficiency 04/11/2017  . Lumbago 02/25/2015  . Essential tremor   . Glaucoma  01/08/2010  . 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 DM (diabetes mellitus), secondary, uncontrolled, with renal complications ([place]) 67/89/3810  . Diabetic neuropathy ([place]) 02/22/2006  . Dyslipidemia 02/22/2006  . 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 CARPAL TUNNEL SYNDROME, BILATERAL 02/22/2006    Conditions to be addressed/monitored:CHF and Dementia  Care Plan : Dementia (Adult)  Updates made by Knox Royalty, RN since 08/01/2020 12:00 AM    Problem: Harm or Injury   Priority: Medium    Long-Range Goal: Harm or Injury Prevented   Start Date: 06/03/2020  Expected End Date: 09/03/2020  Recent Progress: On track  Priority: Medium  Note:   Current Barriers:  Marland Kitchen Knowledge Deficits related to fall precautions in patient with memory loss  . Need for ongoing reinforcement of fall prevention strategies . Unable to independently perform ADLs and iADL's: requires assistance for all from family caregivers . Unable to self administer medications as prescribed- family administers medications, uses pill box . Care Coordination potential needs related to upcoming transfer of care to PACE program in a patient with memory loss/ dementia in patient that requires total care from family caregivers . Cognitive Deficits: decreased vision, memory loss Clinical Goal(s): 08/01/20: Goal completed; caregiver/ daughter Erin Liu on [place] confirms that patient is under Hospice care at home Over the next 3 months, patient's caregiver will: . verbalize improved adherence to prescribed treatment plan for decreasing falls as evidenced by caregiver reporting and review of EHR . verbalize using fall risk reduction strategies discussed Interventions:  . Collaboration with Binnie Rail, MD regarding development and update of comprehensive plan of care as evidenced by provider attestation and co-signature . Inter-disciplinary care team collaboration (see longitudinal plan of care) . 08/01/20: Goal  completed; caregiver/ daughter Erin Liu on [place] confirms that patient is under Hospice care at home Follow Up Plan:  . Case Closure: 08/01/20: Goal completed; caregiver/ daughter Erin Liu on [place] confirms that patient is under Hospice care at home . The patient's caregiver has been provided with contact information for the care management team and has been advised to call with any health related questions or concerns should needs arise in the future     Care Plan : Heart Failure (Adult)  Updates made by Knox Royalty, RN since 08/01/2020 12:00 AM    Problem: Symptom Exacerbation (Heart Failure)   Priority: Medium    Long-Range Goal: Symptom Exacerbation Prevented or Minimized   Start Date: 06/03/2020  Expected End Date: 09/03/2020  Recent Progress: On track  Priority: Medium  Note:   Current Barriers:  Marland Kitchen Knowledge deficit related to basic heart failure pathophysiology and self care management . Cognitive Deficits- memory loss . Unable to independently perform ADLs/ iADL's independently: strong supportive family assists with all care needs . Unable to self administer medications as prescribed- family provides medications and supervises medication administration . Weakness post- recent hospitalization Case Manager Clinical Goal(s): 08/01/20: Goal completed; caregiver/ daughter Erin Liu on [place] confirms that patient is under Hospice care at home Over the next 90 days, patient's caregiver will: Marland Kitchen Verbalize accurate signs/ symptoms of heart failure exacerbation in setting of being unable to weigh  patient daily due to fall risk . Verbalize understanding of Heart Failure Action Plan and when to call doctor Interventions:  . Collaboration with Binnie Rail, MD regarding development and update of comprehensive plan of care as evidenced by provider attestation and co-signature . Inter-disciplinary care team collaboration (see longitudinal plan of care) . 08/01/20: Goal  completed; caregiver/ daughter Erin Liu on [place] confirms that patient is under Hospice care at home Follow Up Plan:  . Case Closure: 08/01/20: Goal completed; caregiver/ daughter Erin Liu on [place] confirms that patient is under Hospice care at home . The patient's caregiver has been provided with contact information for the care management team and has been advised to call with any health related questions or concerns should needs arise in the future       Plan:  CCM enrollment status changed to "previously enrolled" as per patient's caregiver she is now active with Hospice services at home  Case closed to case management services in primary care home.   Oneta Rack, RN, BSN, [facility] RN Care Coordination- [place] [phone]: direct office [phone]: mobile

## 2020-08-05 ENCOUNTER — Telehealth: Payer: Self-pay

## 2020-08-05 NOTE — Chronic Care Management (AMB) (Signed)
Patient's CCM case has been closed at this time as she has transitioned to hospice care  Care plan and goals have been completed   Erin Liu, PharmD Clinical Pharmacist, [place]

## 2020-08-05 NOTE — Progress Notes (Signed)
Patient's CCM has been closed as she has transitioned to hospice care.  Care plan and goals created have been completed  Tomasa Blase, PharmD Clinical Pharmacist, [place]

## 2020-08-11 ENCOUNTER — Other Ambulatory Visit: Payer: Self-pay | Admitting: Internal Medicine

## 2020-08-22 DIAGNOSIS — J189 Pneumonia, unspecified organism: Secondary | ICD-10-CM | POA: Diagnosis not present

## 2020-08-22 DIAGNOSIS — R5381 Other malaise: Secondary | ICD-10-CM | POA: Diagnosis not present

## 2020-08-22 DIAGNOSIS — I214 Non-ST elevation (NSTEMI) myocardial infarction: Secondary | ICD-10-CM | POA: Diagnosis not present

## 2020-08-22 DIAGNOSIS — N184 Chronic kidney disease, stage 4 (severe): Secondary | ICD-10-CM | POA: Diagnosis not present

## 2020-09-05 DEATH — deceased

## 2020-09-09 ENCOUNTER — Telehealth: Payer: Medicare HMO

## 2020-09-29 ENCOUNTER — Ambulatory Visit: Payer: Medicare HMO | Admitting: Internal Medicine

## 2022-08-31 IMAGING — MR MR HEAD W/O CM
9 series · 47 of 48 positions shown · non-contrast
Comparison: Head CT 1990 hours yesterday.  Head CT 10/02/2017.

CLINICAL DATA: 80-year-old female status post fall. Unexplained
altered mental status.

EXAM:
MRI HEAD WITHOUT CONTRAST
TECHNIQUE: Multiplanar, multiecho pulse sequences of the brain and surrounding
structures were obtained without intravenous contrast.

[Series 5: DWI · axial · 3.0mm · 0.88mm/px · z∈[-102,+37]mm · 13 of 100 slices shown (1 of 4)]
[im 1/100]
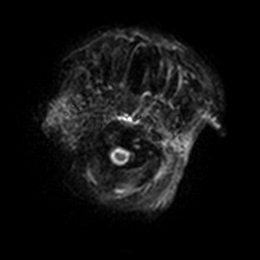
[im 8/100]
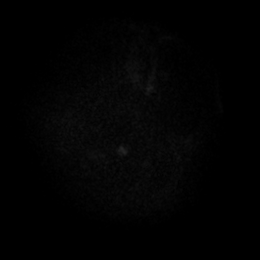
[im 16/100]
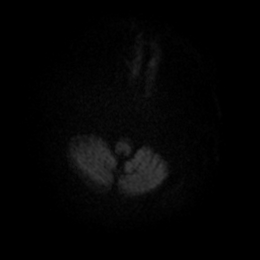
[im 23/100]
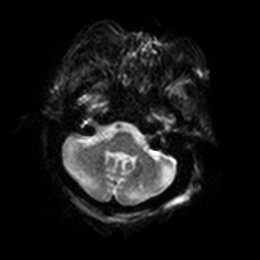
[im 31/100]
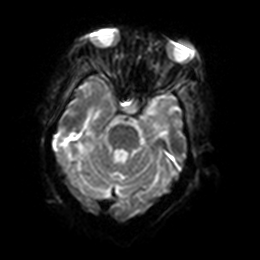
[im 39/100]
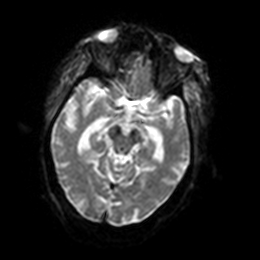
[im 46/100]
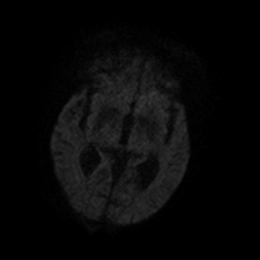
[im 54/100]
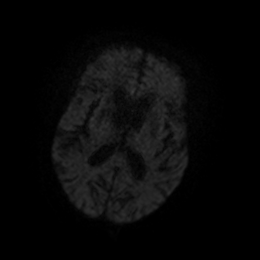
[im 61/100]
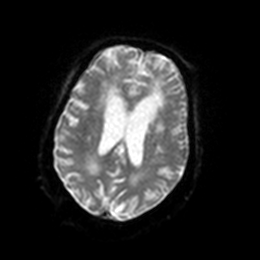
[im 69/100]
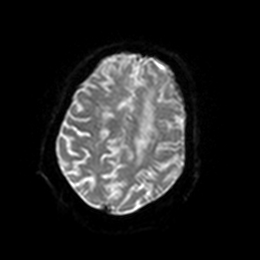
[im 77/100]
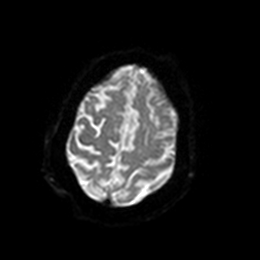
[im 84/100]
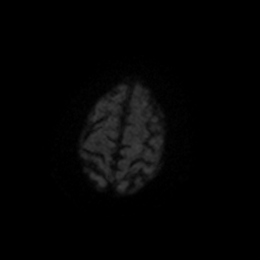
[im 100/100]
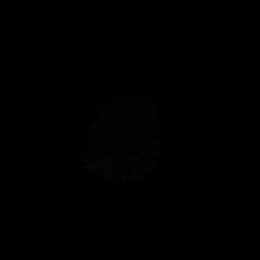

[Series 6: DWI · axial · 3.0mm · 0.88mm/px · z∈[-102,+37]mm · 6 of 50 slices shown (2 of 4)]
[im 1/50]
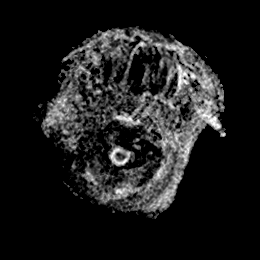
[im 10/50]
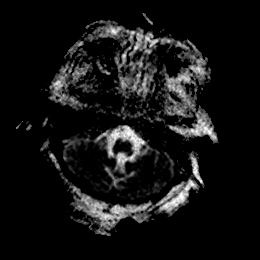
[im 20/50]
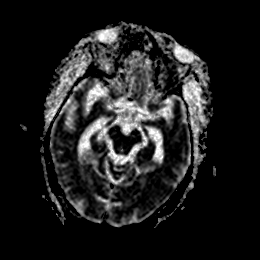
[im 30/50]
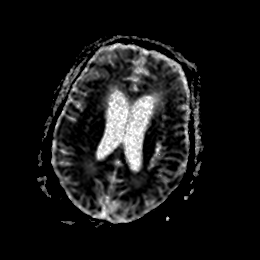
[im 40/50]
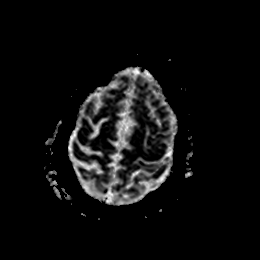
[im 50/50]
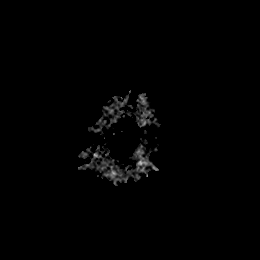

[Series 7: DWI · coronal · 4.0mm · 0.88mm/px · 8 of 64 slices shown (3 of 4)]
[im 1/64]
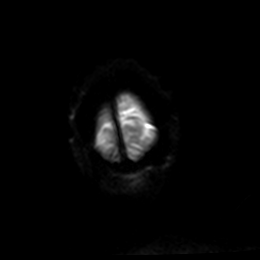
[im 10/64]
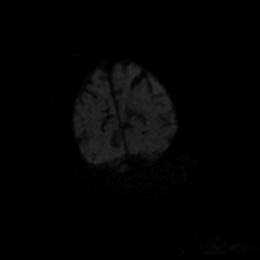
[im 19/64]
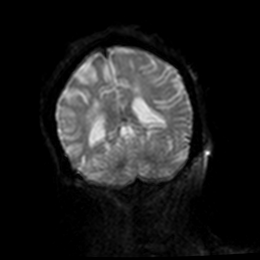
[im 28/64]
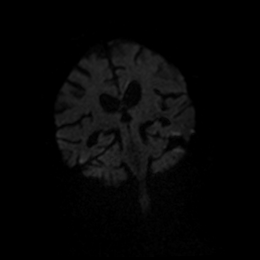
[im 37/64]
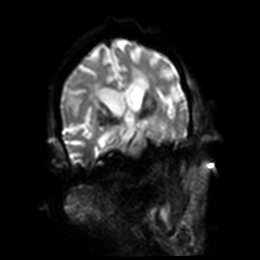
[im 46/64]
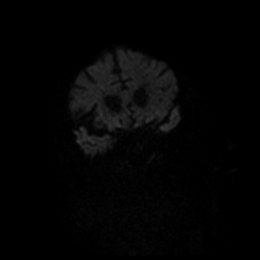
[im 55/64]
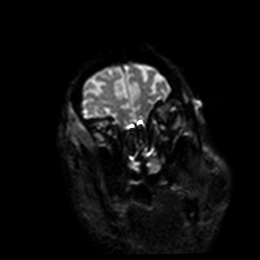
[im 64/64]
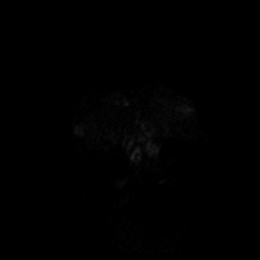

[Series 8: DWI · coronal · 4.0mm · 0.88mm/px · 4 of 32 slices shown (4 of 4)]
[im 1/32]
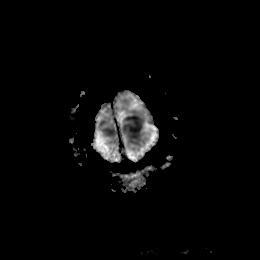
[im 11/32]
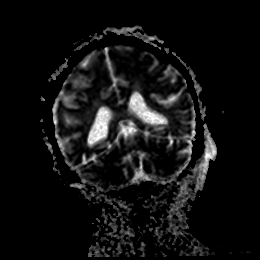
[im 21/32]
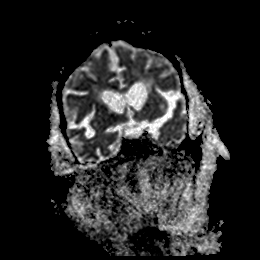
[im 32/32]
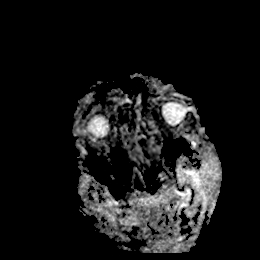

[Series 9: FLAIR · axial · 5.0mm · 0.90mm/px · z∈[-101,+36]mm · 3 of 25 slices shown]
[im 1/25]
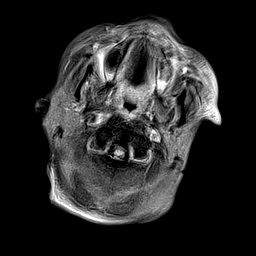
[im 13/25]
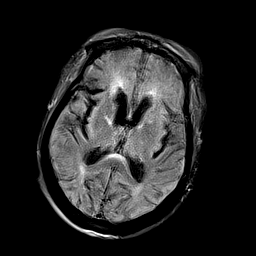
[im 25/25]
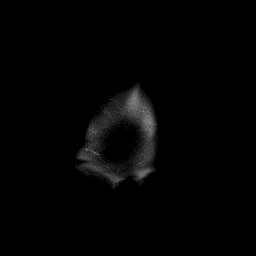

[Series 14: T2 · axial · 5.0mm · 0.72mm/px · z∈[-101,+36]mm · 3 of 25 slices shown (1 of 2)]
[im 1/25]
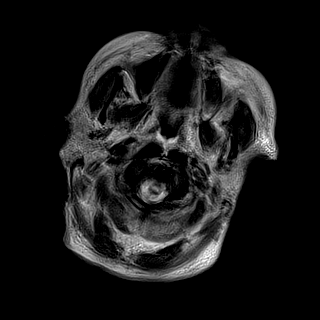
[im 13/25]
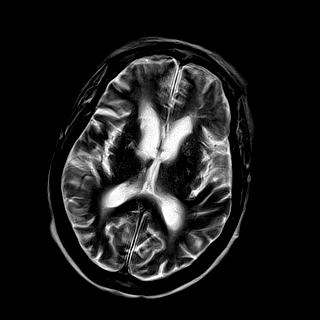
[im 25/25]
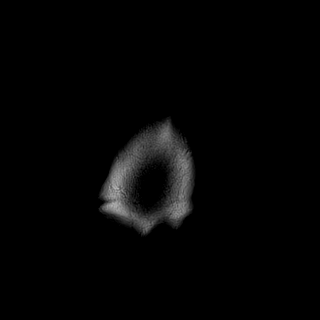

[Series 15: ax hemo · axial · 5.0mm · 0.86mm/px · z∈[-97,+40]mm · 3 of 25 slices shown]
[im 1/25]
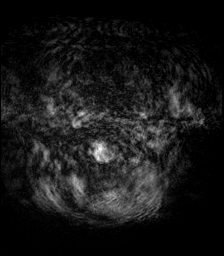
[im 13/25]
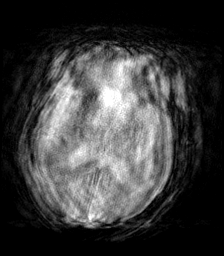
[im 25/25]
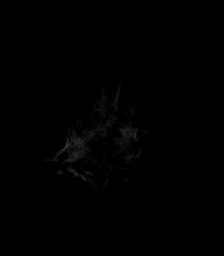

[Series 16: T1 · sagittal · 5.0mm · 0.90mm/px · 3 of 21 slices shown]
[im 1/21]
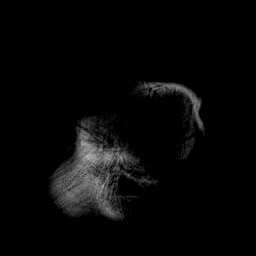
[im 11/21]
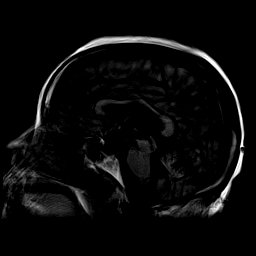
[im 21/21]
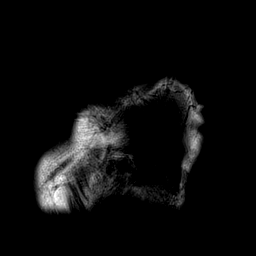

[Series 17: T2 · coronal · 5.0mm · 0.34mm/px · 4 of 29 slices shown (2 of 2)]
[im 1/29]
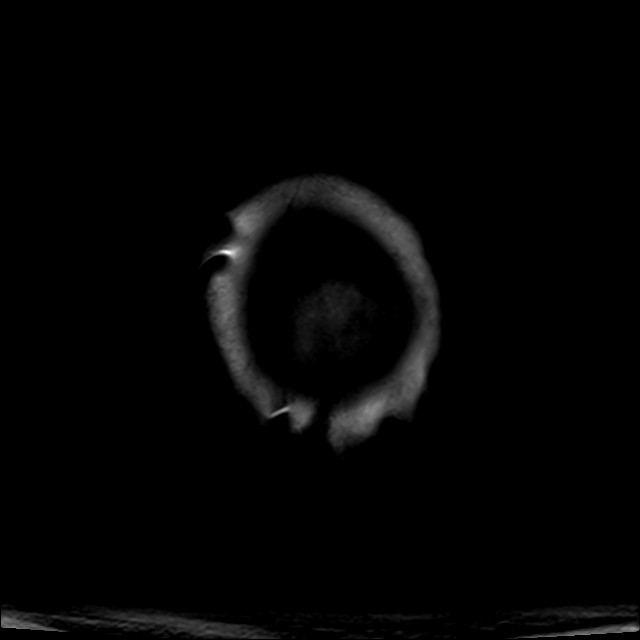
[im 10/29]
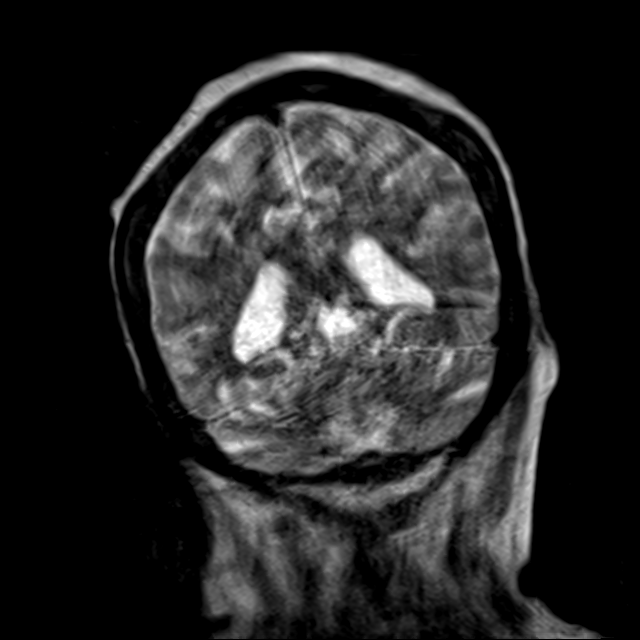
[im 19/29]
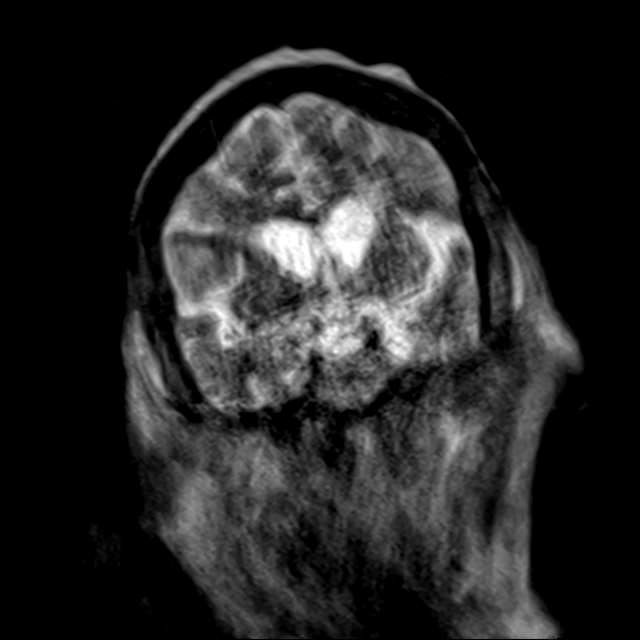
[im 29/29]
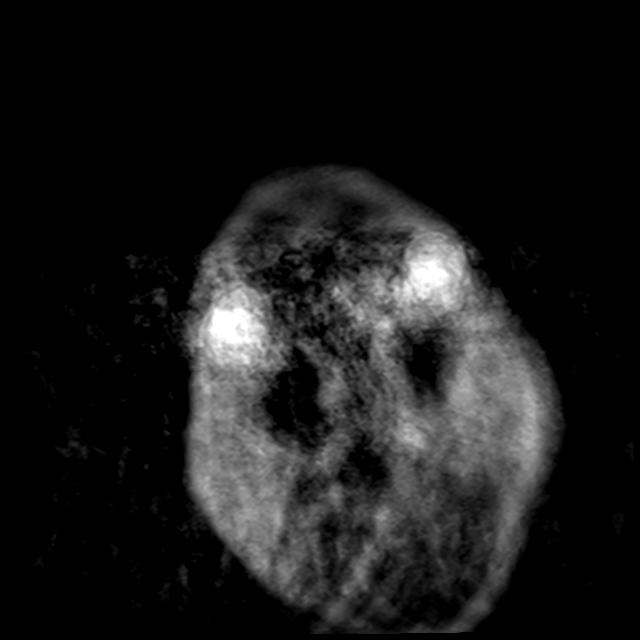

[47 of 48 positions shown; findings below may reference images not displayed]

FINDINGS: Study is intermittently degraded by motion artifact despite repeated
imaging attempts. Diagnostic axial T1 weighted imaging could not be
obtained.

Brain: No restricted diffusion is evident. No midline shift, mass
effect, or evidence of intracranial mass lesion. No
ventriculomegaly. Normal basilar cisterns. Cervicomedullary junction
appears negative.

Patchy and confluent bilateral cerebral white matter T2 and FLAIR
hyperintensity. Linear T2 and FLAIR hyperintensity in the right
basal ganglia compatible with chronic lacunar infarct mild similar
left basal ganglia signal changes. No cortical encephalomalacia or
chronic cerebral blood products are evident. Brainstem and
cerebellum appear relatively negative for age.

Vascular: Major intracranial vascular flow voids are grossly
preserved.

Skull and upper cervical spine: Visualized bone marrow signal is
within normal limits. Cervical spine obscured.

Sinuses/Orbits: Postoperative changes to both globes. Paranasal
Visualized paranasal sinuses and mastoids are stable and well
pneumatized.

Other: None.
IMPRESSION: 1. Degraded by motion. No acute intracranial abnormality identified.
2. Signal changes compatible with moderate for age chronic small
vessel disease.
